# Patient Record
Sex: Male | Born: 1945 | Race: Black or African American | Hispanic: No | Marital: Single | Smoking: Former smoker
Health system: Southern US, Community
[De-identification: ages and names within clinical notes are randomized; demographics above are authoritative.]

## PROBLEM LIST (undated history)

## (undated) ENCOUNTER — Emergency Department (HOSPITAL_COMMUNITY): Discharge status: Against Medical Advice | Payer: Medicare Other

## (undated) ENCOUNTER — Emergency Department (HOSPITAL_COMMUNITY): Disposition: A | Discharge status: Home / Self Care | Payer: Self-pay

## (undated) DIAGNOSIS — R55 Syncope and collapse: Secondary | ICD-10-CM

## (undated) DIAGNOSIS — E860 Dehydration: Secondary | ICD-10-CM

## (undated) DIAGNOSIS — F0390 Unspecified dementia without behavioral disturbance: Secondary | ICD-10-CM

## (undated) DIAGNOSIS — I10 Essential (primary) hypertension: Secondary | ICD-10-CM

## (undated) DIAGNOSIS — C3490 Malignant neoplasm of unspecified part of unspecified bronchus or lung: Secondary | ICD-10-CM

## (undated) DIAGNOSIS — D649 Anemia, unspecified: Secondary | ICD-10-CM

## (undated) DIAGNOSIS — H5440 Blindness, one eye, unspecified eye: Secondary | ICD-10-CM

## (undated) HISTORY — DX: Dehydration: E86.0

## (undated) HISTORY — DX: Blindness, one eye, unspecified eye: H54.40

## (undated) HISTORY — DX: Syncope and collapse: R55

## (undated) HISTORY — DX: Essential (primary) hypertension: I10

---

## 2000-03-11 ENCOUNTER — Emergency Department (HOSPITAL_COMMUNITY): Admission: EM | Admit: 2000-03-11 | Discharge: 2000-03-12 | Payer: Self-pay

## 2006-02-09 ENCOUNTER — Ambulatory Visit: Payer: Self-pay | Admitting: Nurse Practitioner

## 2006-03-20 ENCOUNTER — Ambulatory Visit: Payer: Self-pay | Admitting: Nurse Practitioner

## 2006-03-29 ENCOUNTER — Encounter: Payer: Self-pay | Admitting: Cardiology

## 2006-03-29 ENCOUNTER — Ambulatory Visit (HOSPITAL_COMMUNITY): Admission: RE | Admit: 2006-03-29 | Discharge: 2006-03-29 | Payer: Self-pay | Admitting: Family Medicine

## 2006-03-29 ENCOUNTER — Ambulatory Visit: Payer: Self-pay | Admitting: Cardiology

## 2007-07-10 ENCOUNTER — Encounter: Admission: RE | Admit: 2007-07-10 | Discharge: 2007-07-10 | Payer: Self-pay | Admitting: Nephrology

## 2009-01-25 ENCOUNTER — Emergency Department (HOSPITAL_COMMUNITY): Admission: EM | Admit: 2009-01-25 | Discharge: 2009-01-26 | Payer: Self-pay | Admitting: Emergency Medicine

## 2010-01-21 ENCOUNTER — Emergency Department (HOSPITAL_COMMUNITY): Admission: EM | Admit: 2010-01-21 | Discharge: 2010-01-22 | Payer: Self-pay | Admitting: Emergency Medicine

## 2010-07-21 ENCOUNTER — Ambulatory Visit: Payer: Self-pay | Admitting: Cardiovascular Disease

## 2010-08-24 ENCOUNTER — Observation Stay (HOSPITAL_COMMUNITY)
Admission: EM | Admit: 2010-08-24 | Discharge: 2010-08-25 | Payer: Self-pay | Source: Home / Self Care | Attending: Cardiology | Admitting: Cardiology

## 2010-10-24 ENCOUNTER — Emergency Department (HOSPITAL_COMMUNITY)
Admission: EM | Admit: 2010-10-24 | Discharge: 2010-10-24 | Disposition: A | Discharge status: Home / Self Care | Payer: Medicaid Other | Attending: Emergency Medicine | Admitting: Emergency Medicine

## 2010-10-24 ENCOUNTER — Emergency Department (HOSPITAL_COMMUNITY): Payer: Medicaid Other

## 2010-10-24 DIAGNOSIS — S1093XA Contusion of unspecified part of neck, initial encounter: Secondary | ICD-10-CM | POA: Insufficient documentation

## 2010-10-24 DIAGNOSIS — S61209A Unspecified open wound of unspecified finger without damage to nail, initial encounter: Secondary | ICD-10-CM | POA: Insufficient documentation

## 2010-10-24 DIAGNOSIS — J45909 Unspecified asthma, uncomplicated: Secondary | ICD-10-CM | POA: Insufficient documentation

## 2010-10-24 DIAGNOSIS — IMO0002 Reserved for concepts with insufficient information to code with codable children: Secondary | ICD-10-CM | POA: Insufficient documentation

## 2010-10-24 DIAGNOSIS — S0003XA Contusion of scalp, initial encounter: Secondary | ICD-10-CM | POA: Insufficient documentation

## 2010-10-29 ENCOUNTER — Emergency Department (HOSPITAL_COMMUNITY)
Admission: EM | Admit: 2010-10-29 | Discharge: 2010-10-29 | Disposition: A | Discharge status: Home / Self Care | Payer: Medicaid Other | Attending: Emergency Medicine | Admitting: Emergency Medicine

## 2010-10-29 DIAGNOSIS — T8131XA Disruption of external operation (surgical) wound, not elsewhere classified, initial encounter: Secondary | ICD-10-CM | POA: Insufficient documentation

## 2010-10-29 DIAGNOSIS — Y849 Medical procedure, unspecified as the cause of abnormal reaction of the patient, or of later complication, without mention of misadventure at the time of the procedure: Secondary | ICD-10-CM | POA: Insufficient documentation

## 2010-10-29 DIAGNOSIS — X58XXXA Exposure to other specified factors, initial encounter: Secondary | ICD-10-CM | POA: Insufficient documentation

## 2010-10-29 DIAGNOSIS — S61209A Unspecified open wound of unspecified finger without damage to nail, initial encounter: Secondary | ICD-10-CM | POA: Insufficient documentation

## 2010-10-29 DIAGNOSIS — Y929 Unspecified place or not applicable: Secondary | ICD-10-CM | POA: Insufficient documentation

## 2010-11-15 LAB — COMPREHENSIVE METABOLIC PANEL
ALT: 17 U/L (ref 0–53)
AST: 15 U/L (ref 0–37)
Albumin: 2.8 g/dL — ABNORMAL LOW (ref 3.5–5.2)
Albumin: 2.9 g/dL — ABNORMAL LOW (ref 3.5–5.2)
Alkaline Phosphatase: 48 U/L (ref 39–117)
Alkaline Phosphatase: 52 U/L (ref 39–117)
BUN: 18 mg/dL (ref 6–23)
CO2: 29 mEq/L (ref 19–32)
Creatinine, Ser: 1.38 mg/dL (ref 0.4–1.5)
GFR calc non Af Amer: 50 mL/min — ABNORMAL LOW (ref >60)
Glucose, Bld: 97 mg/dL (ref 70–99)
Sodium: 134 mEq/L — ABNORMAL LOW (ref 135–145)
Sodium: 139 mEq/L (ref 135–145)
Total Protein: 7.6 g/dL (ref 6.0–8.3)

## 2010-11-15 LAB — LIPID PANEL
Cholesterol: 141 mg/dL (ref 0–200)
Total CHOL/HDL Ratio: 3.8 RATIO
Triglycerides: 47 mg/dL (ref <150)
VLDL: 9 mg/dL (ref 0–40)

## 2010-11-15 LAB — POCT CARDIAC MARKERS
CKMB, poc: 1 ng/mL (ref 1.0–8.0)
CKMB, poc: 1 ng/mL (ref 1.0–8.0)
CKMB, poc: 1.2 ng/mL (ref 1.0–8.0)
Myoglobin, poc: 116 ng/mL (ref 12–200)
Troponin i, poc: <0.05 ng/mL (ref 0.00–0.09)

## 2010-11-15 LAB — CBC
Hemoglobin: 12.7 g/dL — ABNORMAL LOW (ref 13.0–17.0)
MCH: 30.2 pg (ref 26.0–34.0)
MCV: 89.1 fL (ref 78.0–100.0)
Platelets: 102 10*3/uL — ABNORMAL LOW (ref 150–400)
Platelets: 103 10*3/uL — ABNORMAL LOW (ref 150–400)
RBC: 3.94 MIL/uL — ABNORMAL LOW (ref 4.22–5.81)
WBC: 2.3 10*3/uL — ABNORMAL LOW (ref 4.0–10.5)

## 2010-11-15 LAB — TROPONIN I: Troponin I: 0.02 ng/mL (ref 0.00–0.06)

## 2010-11-15 LAB — CK TOTAL AND CKMB (NOT AT ARMC)
CK, MB: 1.5 ng/mL (ref 0.3–4.0)
Relative Index: 1.4 (ref 0.0–2.5)

## 2010-11-15 LAB — PROTIME-INR: INR: 1.17 (ref 0.00–1.49)

## 2010-11-15 LAB — DIFFERENTIAL
Basophils Relative: 0 % (ref 0–1)
Eosinophils Absolute: 0 10*3/uL (ref 0.0–0.7)
Lymphs Abs: 0.9 10*3/uL (ref 0.7–4.0)

## 2010-11-15 LAB — HEMOGLOBIN A1C: Hgb A1c MFr Bld: 6.2 % — ABNORMAL HIGH (ref <5.7)

## 2010-11-15 LAB — BRAIN NATRIURETIC PEPTIDE: Pro B Natriuretic peptide (BNP): 31 pg/mL (ref 0.0–100.0)

## 2010-11-22 LAB — BASIC METABOLIC PANEL
BUN: 12 mg/dL (ref 6–23)
Calcium: 9.1 mg/dL (ref 8.4–10.5)
Creatinine, Ser: 1.48 mg/dL (ref 0.4–1.5)
GFR calc non Af Amer: 48 mL/min — ABNORMAL LOW (ref >60)
Glucose, Bld: 93 mg/dL (ref 70–99)

## 2010-11-22 LAB — CBC
MCV: 92.9 fL (ref 78.0–100.0)
Platelets: 97 10*3/uL — ABNORMAL LOW (ref 150–400)
RBC: 3.92 MIL/uL — ABNORMAL LOW (ref 4.22–5.81)
RDW: 12.8 % (ref 11.5–15.5)
WBC: 3.9 10*3/uL — ABNORMAL LOW (ref 4.0–10.5)

## 2010-11-22 LAB — DIFFERENTIAL
Basophils Absolute: 0 10*3/uL (ref 0.0–0.1)
Basophils Relative: 0 % (ref 0–1)
Eosinophils Relative: 0 % (ref 0–5)
Lymphocytes Relative: 27 % (ref 12–46)
Neutro Abs: 2.1 10*3/uL (ref 1.7–7.7)
Neutrophils Relative %: 55 % (ref 43–77)

## 2010-12-14 LAB — DIFFERENTIAL
Eosinophils Absolute: 0 10*3/uL (ref 0.0–0.7)
Monocytes Absolute: 0.2 10*3/uL (ref 0.1–1.0)
Monocytes Relative: 5 % (ref 3–12)

## 2010-12-14 LAB — CBC
MCHC: 34.7 g/dL (ref 30.0–36.0)
MCV: 92.3 fL (ref 78.0–100.0)
RBC: 4.01 MIL/uL — ABNORMAL LOW (ref 4.22–5.81)
RDW: 12.9 % (ref 11.5–15.5)
WBC: 4.2 10*3/uL (ref 4.0–10.5)

## 2010-12-14 LAB — URINALYSIS, ROUTINE W REFLEX MICROSCOPIC
Nitrite: NEGATIVE
Urobilinogen, UA: 1 mg/dL (ref 0.0–1.0)

## 2010-12-14 LAB — POCT I-STAT, CHEM 8
BUN: 13 mg/dL (ref 6–23)
Chloride: 105 mEq/L (ref 96–112)
Creatinine, Ser: 1.4 mg/dL (ref 0.4–1.5)
HCT: 39 % (ref 39.0–52.0)
Potassium: 3.8 mEq/L (ref 3.5–5.1)
TCO2: 28 mmol/L (ref 0–100)

## 2011-11-23 ENCOUNTER — Encounter: Payer: Self-pay | Admitting: *Deleted

## 2012-02-08 ENCOUNTER — Encounter: Payer: Self-pay | Admitting: Cardiovascular Disease

## 2012-03-06 ENCOUNTER — Telehealth: Payer: Self-pay | Admitting: Internal Medicine

## 2012-03-06 NOTE — Telephone Encounter (Signed)
l/m to call for new pt appt  aom °

## 2012-03-09 ENCOUNTER — Telehealth: Payer: Self-pay | Admitting: Internal Medicine

## 2012-03-09 NOTE — Telephone Encounter (Signed)
l/m with him for  pt to call,states that he will see the pt and that the pt does not have a direct #    aom

## 2012-03-14 ENCOUNTER — Telehealth: Payer: Self-pay | Admitting: Internal Medicine

## 2012-03-14 NOTE — Telephone Encounter (Signed)
lm/ at this number and also called [phone] and they (man)stated they had not seen him in a while   aom

## 2012-03-22 ENCOUNTER — Telehealth: Payer: Self-pay | Admitting: Internal Medicine

## 2012-03-22 NOTE — Telephone Encounter (Signed)
Referred by Dr. Earley Brooke Polyzos Dx- Leukopenia

## 2012-03-22 NOTE — Telephone Encounter (Signed)
New Pt. Package sent out.

## 2012-03-22 NOTE — Telephone Encounter (Signed)
New Pt package mailed out.  °

## 2012-03-22 NOTE — Telephone Encounter (Signed)
s/w daughter and made new pt appt for 7/23

## 2012-03-27 ENCOUNTER — Telehealth: Payer: Self-pay | Admitting: Medical Oncology

## 2012-03-27 ENCOUNTER — Other Ambulatory Visit (HOSPITAL_BASED_OUTPATIENT_CLINIC_OR_DEPARTMENT_OTHER): Payer: Medicaid Other

## 2012-03-27 ENCOUNTER — Ambulatory Visit: Payer: No Typology Code available for payment source

## 2012-03-27 ENCOUNTER — Ambulatory Visit (HOSPITAL_BASED_OUTPATIENT_CLINIC_OR_DEPARTMENT_OTHER): Payer: Medicaid Other | Admitting: Internal Medicine

## 2012-03-27 ENCOUNTER — Ambulatory Visit: Payer: Medicaid Other

## 2012-03-27 ENCOUNTER — Encounter: Payer: Self-pay | Admitting: Internal Medicine

## 2012-03-27 VITALS — BP 134/82 | HR 61 | Temp 97.5°F | Ht 69.0 in | Wt 185.7 lb

## 2012-03-27 DIAGNOSIS — D72819 Decreased white blood cell count, unspecified: Secondary | ICD-10-CM

## 2012-03-27 DIAGNOSIS — D61818 Other pancytopenia: Secondary | ICD-10-CM

## 2012-03-27 LAB — CBC WITH DIFFERENTIAL/PLATELET
BASO%: 0 % (ref 0.0–2.0)
EOS%: 5.1 % (ref 0.0–7.0)
HCT: 36.8 % — ABNORMAL LOW (ref 38.4–49.9)
LYMPH%: 37.8 % (ref 14.0–49.0)
MCH: 30.1 pg (ref 27.2–33.4)
MCHC: 34.8 g/dL (ref 32.0–36.0)
MCV: 86.6 fL (ref 79.3–98.0)
NEUT%: 46.5 % (ref 39.0–75.0)
Platelets: 115 10*3/uL — ABNORMAL LOW (ref 140–400)

## 2012-03-27 LAB — COMPREHENSIVE METABOLIC PANEL
AST: 15 U/L (ref 0–37)
Alkaline Phosphatase: 59 U/L (ref 39–117)
BUN: 17 mg/dL (ref 6–23)
Calcium: 9.5 mg/dL (ref 8.4–10.5)
Creatinine, Ser: 1.38 mg/dL — ABNORMAL HIGH (ref 0.50–1.35)

## 2012-03-27 NOTE — Patient Instructions (Signed)
Aplastic Anemia Aplastic anemia occurs when the soft material that makes up the hollow insides of your bones (bone marrow) stops making enough blood cells. These cells are:  Red cells to carry oxygen.   White cells to fight infection.   Platelets to help your blood clot when you have an injury.  Your bone marrow continually makes new blood cells because they do not last very long. Red blood cells live about 4 months. Platelets last only one week and white blood cells last about a day. Anything that hurts or injures your bone marrow can cause aplastic anemia. Aplastic anemia affects all age groups although it seems to appear a little more frequently in children. For unknown reasons, it also appears in people aged 54 to 62 and those over age 69. It usually develops slowly. In a few cases, symptoms can develop very quickly. There are many possible causes. Even after aggressive treatment, as many as 1 in 4 patients can die within the first year. Many are treated successfully but must be monitored for possible recurrent problems. Aplastic anemia is a rare and serious condition.  CAUSES  Some things that injure marrow can include:  Radiation and chemotherapy treatment for cancer. These treatments used to kill cancer cells also damage other cells.   Exposure to toxic chemicals used in some pesticides and insecticides may damage marrow.   Some medications, such as those used to treat rheumatoid arthritis and some antibiotics, can cause secondary aplastic anemia.   Autoimmune disorders in which your immune system begins attacking your own body cells.   Viral infections can affect bone marrow.   Pregnancy   Idiopathic aplastic anemia makes up about half the cases of aplastic anemia. This means the cause is unknown.  SYMPTOMS  Red blood cells carry oxygen. A decrease in red blood cells will make you short of breath. White blood cells fight infection. Decreases in white blood cells make you more  likely to get an infection. Platelets help your blood clot. Too few platelets can cause bleeding.  Other common signs and symptoms include:  Fatigue.   Lightheadedness or fainting.   Shortness of breath and rapid heart rate with exertion.   Pale skin and lips.   Frequent infections.   Easy bruising and bleeding.   Nosebleeds and bleeding gums.   Prolonged bleeding from cuts.   Sore mouth.  DIAGNOSIS   Blood tests and a bone marrow biopsy usually are used to find out what is wrong.   A number of different problems can make one of your blood cells low, but when they are all low, it is more worrisome.   Additional testing may be done to find the underlying cause for the anemia.  TREATMENT  You will usually be referred to a specialist in blood diseases (hematologist) or to a treatment center which specializes in the treatment of aplastic anemia. Severe aplastic anemia is life-threatening. You will need to be hospitalized. Mild or moderate aplastic anemia is still serious, but you may be treated as an outpatient unless complications develop. Treatments may include:  Observation for mild cases.   Blood transfusions.   Medications. When anemia is from an autoimmune disorder, medications may be used to suppress the immune system. Medications are also available to stimulate marrow to make more blood cells.   Bone marrow transplantation. This is a procedure where healthy marrow from a donor is given to the person with aplastic anemia. This is used for severe aplastic anemia. If a  donor is found, the marrow that you have left is depleted with radiation. This is done so the remaining marrow does not try to fight against the healthy donor marrow. The healthy marrow is given into a vein and it goes to the bone marrow cavities where it will hopefully begin producing new blood cells.  This procedure carries risk. If the body rejects the transplant, it can be life-threatening. Not everyone is a  candidate for transplantation. It requires a lengthy stay in the hospital. After the transplant, drugs to help prevent rejection are necessary. One other risk is the danger of catching a disease from the donor. Today the blood supply is the safest it has ever been. The risk is extremely small but still remains. HOME CARE INSTRUCTIONS   Get plenty of rest and eat a well balanced diet.   Avoid excessive exercise. Long-term anemia can stress the heart.   When platelets are at low levels and the risk of bleeding is greater, avoid all activities that risk injury.   Protect yourself from infections by washing your hands often. Avoid crowds. Avoid being around sick people.  PREVENTION  Especially if you have had this problem before, avoid exposure to insecticides, herbicides, organic solvents, paint removers and other toxic chemicals. SEEK IMMEDIATE MEDICAL CARE IF:  You develop a temperature above 100 F (37.8 C).   You develop flu-like feelings, signs of infection, or more frequent infections.   You have blood in your urine or bowel movements.   You develop easy bruising or bleeding from your gums or nose.   You have prolonged bleeding from cuts.   You have increasing shortness of breath or chest pain with exertion.   You develop a rapid heart rate with exertion.   You have increasing fatigue and tiredness.   You develop lightheadedness or fainting.   You develop pale skin and lips.   You develop a sore mouth.  MAKE SURE YOU:   Understand these instructions.   Will watch your condition.   Will get help right away if you are not doing well or get worse.  Document Released: 06/19/2007 Document Revised: 08/11/2011 Document Reviewed: 06/19/2007 [facility] Patient Information 2012 [place], [place].

## 2012-03-27 NOTE — Telephone Encounter (Signed)
BM asp/bx scheduled at [facility] 8/2 at 0800 and with flow. Pt instructions reviewed with pt and daughter-in-law -voiced understanding.

## 2012-03-27 NOTE — Progress Notes (Signed)
[facility] Telephone:([phone]   Fax:(336) [phone]  CONSULT NOTE  REASON FOR CONSULTATION:  66 years old [place] American male with pancytopenia.   HPI FARMER Ellis is a 66 y.o. male was past medical history significant for hypertension, asthma and previous leg surgery. The patient was seen recently at the [facility] urgent care [facility] for routine evaluation and physical exam. He has blood work done including CBC. It showed decreased white blood count of 2.3 with absolute neutrophil count of 1000. Hemoglobin was down to 12.8 and hematocrit 37.8% and platelets count 125,000. The patient was referred to me today for further evaluation and recommendation regarding his pancytopenia. He is feeling fine with no specific complaints except for mild fatigue. He denied having any significant bleeding issues, bruises or ecchymosis. Has no weight loss or night sweats. He does not take any over-the-counter medications or any nutritional supplements. Previous echo over the last 2 years showed that the patient had persistent mucositis being in as well as an anemia and thrombocytopenia. For example on 01/22/2010 his white blood count was 3.9, hemoglobin 12.4, hematocrit 36.4% with platelets count of 97,000. On 08/24/2010 his white blood count was 2.3, hemoglobin 12.7, hematocrit 36.9 and platelets count 102,000.  The patient has no family history of anemia or hematologic malignancy. His mother died from a stroke in father died from heart attack. The patient is single and has one son. He is currently unemployed. He has a history of smoking one pack per day for the last 50 years but no history of alcohol or drug abuse @SFHPI @  Past Medical History  Diagnosis Date  . Syncope   . Dehydration   . Blind left eye   . Hypertension     History reviewed. No pertinent past surgical history.  History reviewed. No pertinent family history.  Social History History  Substance Use Topics    . Smoking status: Current Everyday Smoker -- 1.0 packs/day for 38 years  . Smokeless tobacco: Not on file  . Alcohol Use: No    No Known Allergies  Current Outpatient Prescriptions  Medication Sig Dispense Refill  . amLODipine (NORVASC) 10 MG tablet Take 10 mg by mouth Daily.        Review of Systems  A comprehensive review of systems was negative except for: Constitutional: positive for fatigue  Physical Exam  AVW:UJWJX, healthy, no distress, well nourished and well developed SKIN: skin color, texture, turgor are normal HEAD: Normocephalic, No masses, lesions, tenderness or abnormalities EYES: normal EARS: External ears normal OROPHARYNX:no exudate and no erythema  NECK: supple, no adenopathy LYMPH:  no palpable lymphadenopathy, no hepatosplenomegaly LUNGS: clear to auscultation  HEART: regular rate & rhythm, no murmurs and no gallops ABDOMEN:abdomen soft, non-tender, normal bowel sounds and no masses or organomegaly BACK: Back symmetric, no curvature. EXTREMITIES:no joint deformities, effusion, or inflammation, no edema  NEURO: alert & oriented x 3 with fluent speech, no focal motor/sensory deficits  PERFORMANCE STATUS: ECOG 1  LABORATORY DATA: Lab Results  Component Value Date   WBC 2.5* 03/27/2012   HGB 12.8* 03/27/2012   HCT 36.8* 03/27/2012   MCV 86.6 03/27/2012   PLT 115* 03/27/2012      Chemistry      Component Value Date/Time   NA 139 08/25/2010 0525   K 3.5 08/25/2010 0525   CL 106 08/25/2010 0525   CO2 29 08/25/2010 0525   BUN 18 08/25/2010 0525   CREATININE 1.44 08/25/2010 0525  Component Value Date/Time   CALCIUM 9.1 08/25/2010 0525   ALKPHOS 48 08/25/2010 0525   AST 15 08/25/2010 0525   ALT 17 08/25/2010 0525   BILITOT 0.5 08/25/2010 0525       RADIOGRAPHIC STUDIES: No results found.  ASSESSMENT: This is a very pleasant 66 years old [place] American male with pancytopenia of unclear etiology.  PLAN: I have a lengthy discussion with  the patient and his daughter-in-law today about his current condition and further investigation to identify the etiology for his condition. I ordered several studies today including repeat CBC, comprehensive metabolic panel, LDH, iron study, ferritin, serum folate, serum vitamin B12, serum protein electrophoreses. I would also consider the patient for a bone marrow biopsy and aspirate in the next one to 2 weeks.  I would see the patient back for followup visit in 3 weeks for evaluation and discussion of his lab and biopsy results. He was given patient instructions about pancytopenia. He was advised to call me immediately she has any concerning symptoms in the interval.  All questions were answered. The patient knows to call the clinic with any problems, questions or concerns. We can certainly see the patient much sooner if necessary.  Thank you so much for allowing me to participate in the care of Stephen Ellis. I will continue to follow up the patient with you and assist in his care.  I spent 30 minutes counseling the patient face to face. The total time spent in the appointment was 55 minutes.  Shaylynn Nulty K. 03/27/2012, 2:54 PM

## 2012-03-27 NOTE — Telephone Encounter (Signed)
Placed patient on the walk in list for the lab on 03-27-2012 gave patient appointment for 04-19-2012 starting at 10:30am printed out calendar and gave to the patient

## 2012-04-02 LAB — IRON AND TIBC
%SAT: 39 % (ref 20–55)
Iron: 98 ug/dL (ref 42–165)
UIBC: 152 ug/dL (ref 125–400)

## 2012-04-02 LAB — PROTEIN ELECTROPHORESIS, SERUM, WITH REFLEX
Albumin ELP: 43.8 % — ABNORMAL LOW (ref 55.8–66.1)
Alpha-2-Globulin: 8.5 % (ref 7.1–11.8)
Beta 2: 34.2 % — ABNORMAL HIGH (ref 3.2–6.5)
Beta Globulin: 4.1 % — ABNORMAL LOW (ref 4.7–7.2)

## 2012-04-02 LAB — ERYTHROPOIETIN: Erythropoietin: 14.1 m[IU]/mL (ref 2.6–34.0)

## 2012-04-02 LAB — HEPATITIS PANEL, ACUTE
HCV Ab: NEGATIVE
Hep A IgM: NEGATIVE

## 2012-04-02 LAB — FERRITIN: Ferritin: 399 ng/mL — ABNORMAL HIGH (ref 22–322)

## 2012-04-02 LAB — RHEUMATOID FACTOR: Rhuematoid fact SerPl-aCnc: <10 IU/mL (ref <=14)

## 2012-04-02 LAB — IGG, IGA, IGM: IgM, Serum: 38 mg/dL — ABNORMAL LOW (ref 41–251)

## 2012-04-02 LAB — HIV ANTIBODY (ROUTINE TESTING W REFLEX): HIV: NONREACTIVE

## 2012-04-02 LAB — ANA: Anti Nuclear Antibody(ANA): NEGATIVE

## 2012-04-04 ENCOUNTER — Other Ambulatory Visit (HOSPITAL_COMMUNITY): Payer: Self-pay | Admitting: *Deleted

## 2012-04-06 ENCOUNTER — Ambulatory Visit (HOSPITAL_COMMUNITY)
Admission: RE | Admit: 2012-04-06 | Discharge: 2012-04-06 | Disposition: A | Discharge status: Home / Self Care | Payer: Medicaid Other | Source: Ambulatory Visit | Attending: Internal Medicine | Admitting: Internal Medicine

## 2012-04-06 ENCOUNTER — Encounter (HOSPITAL_COMMUNITY): Payer: Self-pay

## 2012-04-06 VITALS — BP 134/84 | HR 53 | Temp 97.5°F | Resp 19 | Ht 69.0 in | Wt 185.0 lb

## 2012-04-06 DIAGNOSIS — I10 Essential (primary) hypertension: Secondary | ICD-10-CM | POA: Insufficient documentation

## 2012-04-06 DIAGNOSIS — D61818 Other pancytopenia: Secondary | ICD-10-CM | POA: Insufficient documentation

## 2012-04-06 DIAGNOSIS — D72819 Decreased white blood cell count, unspecified: Secondary | ICD-10-CM

## 2012-04-06 DIAGNOSIS — D72822 Plasmacytosis: Secondary | ICD-10-CM | POA: Insufficient documentation

## 2012-04-06 DIAGNOSIS — F17200 Nicotine dependence, unspecified, uncomplicated: Secondary | ICD-10-CM | POA: Insufficient documentation

## 2012-04-06 HISTORY — DX: Unspecified dementia, unspecified severity, without behavioral disturbance, psychotic disturbance, mood disturbance, and anxiety: F03.90

## 2012-04-06 HISTORY — DX: Anemia, unspecified: D64.9

## 2012-04-06 LAB — CBC WITH DIFFERENTIAL/PLATELET
Basophils Relative: 0 % (ref 0–1)
Eosinophils Absolute: 0.1 10*3/uL (ref 0.0–0.7)
Eosinophils Relative: 3 % (ref 0–5)
HCT: 34.7 % — ABNORMAL LOW (ref 39.0–52.0)
Hemoglobin: 12.1 g/dL — ABNORMAL LOW (ref 13.0–17.0)
Lymphs Abs: 1 10*3/uL (ref 0.7–4.0)
MCH: 30.5 pg (ref 26.0–34.0)
MCHC: 34.9 g/dL (ref 30.0–36.0)
MCV: 87.4 fL (ref 78.0–100.0)
Monocytes Absolute: 0.4 10*3/uL (ref 0.1–1.0)
Neutro Abs: 1.4 10*3/uL — ABNORMAL LOW (ref 1.7–7.7)
RBC: 3.97 MIL/uL — ABNORMAL LOW (ref 4.22–5.81)

## 2012-04-06 MED ORDER — SODIUM CHLORIDE 0.9 % IV SOLN
Freq: Once | INTRAVENOUS | Status: AC
  Administered 2012-04-06: 07:00:00 via INTRAVENOUS

## 2012-04-06 MED ORDER — MIDAZOLAM HCL 10 MG/2ML IJ SOLN
INTRAMUSCULAR | Status: AC
  Administered 2012-04-06: 10 mg
  Filled 2012-04-06: qty 2

## 2012-04-06 NOTE — ED Notes (Signed)
Family updated as to patient's status.

## 2012-04-06 NOTE — ED Notes (Signed)
Family updated as to patient's status. Teaching done c family and patient

## 2012-04-06 NOTE — Procedures (Signed)
Bone Marrow Biopsy and Aspiration Procedure Note  Mallampati's class: 1  ASA class: 1  Informed consent was obtained and potential risks including bleeding, infection and pain were reviewed with the patient.   Versed 4 mg given IV for sedation  Posterior iliac crest(s) prepped with Betadine.   Lidocaine 2% local anesthesia infiltrated into the subcutaneous tissue.  Left bone marrow biopsy and left bone marrow aspirate was obtained.   The procedure was tolerated well and there were no complications.  Specimens sent for: routine histopathologic stains and sectioning, flow cytometry, cytogenetics and Myeloma Panel.  Physician: Lajuana Matte.

## 2012-04-06 NOTE — ED Notes (Signed)
Patient denies pain and is resting comfortably.  

## 2012-04-06 NOTE — ED Notes (Signed)
Pt very anxious  Requiring holding of arms and legs by staff

## 2012-04-06 NOTE — ED Notes (Signed)
Vital signs stable. 

## 2012-04-06 NOTE — Sedation Documentation (Signed)
Medication dose calculated and verified for: Stephen Ellis [id number]

## 2012-04-06 NOTE — ED Notes (Signed)
Patient is resting comfortably. 

## 2012-04-06 NOTE — ED Notes (Signed)
dsg cdi 

## 2012-04-06 NOTE — ED Notes (Signed)
dsg applied per md to left  hip area

## 2012-04-06 NOTE — Progress Notes (Signed)
Patient unable to answer some questions correctly. Asked family if patient had dementia and daughter in law stated" he is just illiterate".

## 2012-04-18 ENCOUNTER — Ambulatory Visit: Payer: No Typology Code available for payment source | Admitting: Internal Medicine

## 2012-04-18 ENCOUNTER — Other Ambulatory Visit: Payer: No Typology Code available for payment source | Admitting: Lab

## 2012-04-19 ENCOUNTER — Other Ambulatory Visit: Payer: Self-pay | Admitting: *Deleted

## 2012-04-19 ENCOUNTER — Ambulatory Visit (HOSPITAL_BASED_OUTPATIENT_CLINIC_OR_DEPARTMENT_OTHER): Payer: Medicaid Other | Admitting: Internal Medicine

## 2012-04-19 ENCOUNTER — Telehealth: Payer: Self-pay | Admitting: Internal Medicine

## 2012-04-19 ENCOUNTER — Other Ambulatory Visit (HOSPITAL_BASED_OUTPATIENT_CLINIC_OR_DEPARTMENT_OTHER): Payer: Medicaid Other | Admitting: Lab

## 2012-04-19 VITALS — BP 142/85 | HR 51 | Temp 97.1°F | Resp 20 | Ht 69.0 in | Wt 189.8 lb

## 2012-04-19 DIAGNOSIS — D72819 Decreased white blood cell count, unspecified: Secondary | ICD-10-CM

## 2012-04-19 DIAGNOSIS — E8809 Other disorders of plasma-protein metabolism, not elsewhere classified: Secondary | ICD-10-CM

## 2012-04-19 LAB — CBC WITH DIFFERENTIAL/PLATELET
BASO%: 0.1 % (ref 0.0–2.0)
HCT: 36.1 % — ABNORMAL LOW (ref 38.4–49.9)
LYMPH%: 34.9 % (ref 14.0–49.0)
MCHC: 34 g/dL (ref 32.0–36.0)
MCV: 90.9 fL (ref 79.3–98.0)
MONO%: 18.1 % — ABNORMAL HIGH (ref 0.0–14.0)
NEUT%: 45.7 % (ref 39.0–75.0)
Platelets: 120 10*3/uL — ABNORMAL LOW (ref 140–400)
RBC: 3.97 10*6/uL — ABNORMAL LOW (ref 4.20–5.82)

## 2012-04-19 NOTE — Progress Notes (Signed)
[facility] Telephone:(336) [phone]   Fax:(336) [phone]  OFFICE PROGRESS NOTE  No primary provider on file. No primary provider on file.  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 male returns to the clinic today for followup visit accompanied by several family members. The patient is doing fine today with no specific complaints. He denied having any significant weight loss or night sweats. He denied having any fatigue or weakness. He has no chest pain, shortness breath, cough or hemoptysis. He had bone marrow biopsy and aspirate performed recently and he is here for evaluation and discussion of his biopsy and lab results.  MEDICAL HISTORY: Past Medical History  Diagnosis Date  . Syncope   . Dehydration   . Blind left eye   . Hypertension   . Anemia     "low Blood"  . Dementia     poor memory    ALLERGIES:   has no known allergies.  MEDICATIONS:  Current Outpatient Prescriptions  Medication Sig Dispense Refill  . amLODipine (NORVASC) 10 MG tablet Take 10 mg by mouth Daily.        SURGICAL HISTORY:  Past Surgical History  Procedure Date  . No past surgeries     REVIEW OF SYSTEMS:  A comprehensive review of systems was negative.   PHYSICAL EXAMINATION: General appearance: alert, cooperative and no distress Head: Normocephalic, without obvious abnormality, atraumatic Neck: no adenopathy Lymph nodes: Cervical, supraclavicular, and axillary nodes normal. Resp: clear to auscultation bilaterally Cardio: regular rate and rhythm, S1, S2 normal, no murmur, click, rub or gallop GI: soft, non-tender; bowel sounds normal; no masses,  no organomegaly Extremities: extremities normal, atraumatic, no cyanosis or edema Neurologic: Alert and oriented X 3, normal strength and tone. Normal symmetric reflexes. Normal coordination and gait  ECOG  PERFORMANCE STATUS: 1 - Symptomatic but completely ambulatory  Blood pressure 142/85, pulse 51, temperature 97.1 F (36.2 C), temperature source Oral, resp. rate 20, height 5\' 9"  (1.753 m), weight 189 lb 12.8 oz (86.093 kg).  LABORATORY DATA: Lab Results  Component Value Date   WBC 3.2* 04/19/2012   HGB 12.3* 04/19/2012   HCT 36.1* 04/19/2012   MCV 90.9 04/19/2012   PLT 120* 04/19/2012      Chemistry      Component Value Date/Time   NA 138 03/27/2012 1337   K 3.8 03/27/2012 1337   CL 105 03/27/2012 1337   CO2 25 03/27/2012 1337   BUN 17 03/27/2012 1337   CREATININE 1.38* 03/27/2012 1337      Component Value Date/Time   CALCIUM 9.5 03/27/2012 1337   ALKPHOS 59 03/27/2012 1337   AST 15 03/27/2012 1337   ALT 17 03/27/2012 1337   BILITOT 0.4 03/27/2012 1337       RADIOGRAPHIC STUDIES: No results found.  ASSESSMENT: This is a very pleasant 66 years old [place] American male recently diagnosed with plasma cell dyscrasia presenting 11% of the bone marrow with a slightly elevated IgG.  PLAN: I discussed the lab and biopsy result with the patient and his family. I will order skeletal bone survey to be performed before his next visit. I would see the patient back for followup visit in 3 months with repeat CBC, comprehensive metabolic panel, LDH and myeloma panel. He was advised to call me immediately if he has any concerning symptoms in the interval.  All questions were  answered. The patient knows to call the clinic with any problems, questions or concerns. We can certainly see the patient much sooner if necessary.  I spent 15 minutes counseling the patient face to face. The total time spent in the appointment was 25 minutes.

## 2012-04-19 NOTE — Telephone Encounter (Signed)
gv relative appt schedule for November including bone survey for 11/11.

## 2012-07-13 ENCOUNTER — Telehealth: Payer: Self-pay | Admitting: Internal Medicine

## 2012-07-13 NOTE — Telephone Encounter (Signed)
called to r/s appts,done    aom

## 2012-07-16 ENCOUNTER — Other Ambulatory Visit: Payer: Medicaid Other

## 2012-07-16 ENCOUNTER — Other Ambulatory Visit (HOSPITAL_COMMUNITY): Payer: Medicaid Other

## 2012-07-18 ENCOUNTER — Ambulatory Visit: Payer: Medicaid Other | Admitting: Internal Medicine

## 2012-07-20 ENCOUNTER — Other Ambulatory Visit (HOSPITAL_BASED_OUTPATIENT_CLINIC_OR_DEPARTMENT_OTHER): Payer: Medicaid Other

## 2012-07-20 ENCOUNTER — Other Ambulatory Visit: Payer: Medicaid Other

## 2012-07-20 ENCOUNTER — Ambulatory Visit (HOSPITAL_COMMUNITY)
Admission: RE | Admit: 2012-07-20 | Discharge: 2012-07-20 | Disposition: A | Discharge status: Home / Self Care | Payer: Medicaid Other | Source: Ambulatory Visit | Attending: Internal Medicine | Admitting: Internal Medicine

## 2012-07-20 DIAGNOSIS — M1710 Unilateral primary osteoarthritis, unspecified knee: Secondary | ICD-10-CM | POA: Insufficient documentation

## 2012-07-20 DIAGNOSIS — C9000 Multiple myeloma not having achieved remission: Secondary | ICD-10-CM | POA: Insufficient documentation

## 2012-07-20 DIAGNOSIS — E8809 Other disorders of plasma-protein metabolism, not elsewhere classified: Secondary | ICD-10-CM

## 2012-07-20 DIAGNOSIS — M948X9 Other specified disorders of cartilage, unspecified sites: Secondary | ICD-10-CM | POA: Insufficient documentation

## 2012-07-20 DIAGNOSIS — M47817 Spondylosis without myelopathy or radiculopathy, lumbosacral region: Secondary | ICD-10-CM | POA: Insufficient documentation

## 2012-07-20 LAB — CBC WITH DIFFERENTIAL/PLATELET
BASO%: 0.6 % (ref 0.0–2.0)
EOS%: 0.4 % (ref 0.0–7.0)
HCT: 37.4 % — ABNORMAL LOW (ref 38.4–49.9)
MCH: 32.4 pg (ref 27.2–33.4)
MCHC: 35.2 g/dL (ref 32.0–36.0)
MONO#: 0.4 10*3/uL (ref 0.1–0.9)
NEUT%: 51.3 % (ref 39.0–75.0)
RBC: 4.07 10*6/uL — ABNORMAL LOW (ref 4.20–5.82)
RDW: 13.6 % (ref 11.0–14.6)
WBC: 2.3 10*3/uL — ABNORMAL LOW (ref 4.0–10.3)
lymph#: 0.7 10*3/uL — ABNORMAL LOW (ref 0.9–3.3)

## 2012-07-20 LAB — COMPREHENSIVE METABOLIC PANEL (CC13)
ALT: 16 U/L (ref 0–55)
Albumin: 3.2 g/dL — ABNORMAL LOW (ref 3.5–5.0)
Alkaline Phosphatase: 60 U/L (ref 40–150)
CO2: 28 mEq/L (ref 22–29)
Glucose: 99 mg/dl (ref 70–99)
Potassium: 4.2 mEq/L (ref 3.5–5.1)
Sodium: 137 mEq/L (ref 136–145)
Total Bilirubin: 0.44 mg/dL (ref 0.20–1.20)
Total Protein: 8.5 g/dL — ABNORMAL HIGH (ref 6.4–8.3)

## 2012-07-23 LAB — KAPPA/LAMBDA LIGHT CHAINS
Kappa free light chain: 4.81 mg/dL — ABNORMAL HIGH (ref 0.33–1.94)
Kappa:Lambda Ratio: 1.94 — ABNORMAL HIGH (ref 0.26–1.65)
Lambda Free Lght Chn: 2.48 mg/dL (ref 0.57–2.63)

## 2012-07-23 LAB — BETA 2 MICROGLOBULIN, SERUM: Beta-2 Microglobulin: 2.74 mg/L — ABNORMAL HIGH (ref 1.01–1.73)

## 2012-07-24 ENCOUNTER — Ambulatory Visit: Payer: Medicaid Other | Admitting: Internal Medicine

## 2012-07-26 ENCOUNTER — Other Ambulatory Visit: Payer: Self-pay | Admitting: *Deleted

## 2012-07-31 ENCOUNTER — Telehealth: Payer: Self-pay | Admitting: *Deleted

## 2012-07-31 NOTE — Telephone Encounter (Signed)
Mailed out calendar to inform the patient of the new date and time on 08-20-2012

## 2012-08-20 ENCOUNTER — Telehealth: Payer: Self-pay | Admitting: Internal Medicine

## 2012-08-20 ENCOUNTER — Ambulatory Visit (HOSPITAL_BASED_OUTPATIENT_CLINIC_OR_DEPARTMENT_OTHER): Payer: Medicaid Other | Admitting: Internal Medicine

## 2012-08-20 VITALS — BP 128/77 | HR 55 | Temp 98.0°F | Resp 20 | Ht 69.0 in | Wt 185.9 lb

## 2012-08-20 DIAGNOSIS — E8809 Other disorders of plasma-protein metabolism, not elsewhere classified: Secondary | ICD-10-CM

## 2012-08-20 NOTE — Patient Instructions (Signed)
Your lab showed increase in the myeloma panel but you are currently asymptomatic. He will continue on observation for now with repeat myeloma panel in 3 months. Please call if you have any concerning symptoms in the interval

## 2012-08-20 NOTE — Telephone Encounter (Signed)
appts made and printed for pt aom °

## 2012-08-20 NOTE — Progress Notes (Signed)
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 male returns to the clinic today for followup visit accompanied by his son. The patient is feeling fine today with no specific complaints. He denied having any aching pain. He denied having any weight loss or night sweats. He denied having any significant chest pain, shortness breath, cough or hemoptysis. He had repeat myeloma panel performed recently and he is here today for evaluation and discussion of his lab results.  MEDICAL HISTORY: Past Medical History  Diagnosis Date  . Syncope   . Dehydration   . Blind left eye   . Hypertension   . Anemia     "low Blood"  . Dementia     poor memory    ALLERGIES:   has no known allergies.  MEDICATIONS:  Current Outpatient Prescriptions  Medication Sig Dispense Refill  . amLODipine (NORVASC) 10 MG tablet Take 10 mg by mouth Daily.        SURGICAL HISTORY:  Past Surgical History  Procedure Date  . No past surgeries     REVIEW OF SYSTEMS:  A comprehensive review of systems was negative.   PHYSICAL EXAMINATION: General appearance: alert, cooperative and no distress Head: Normocephalic, without obvious abnormality, atraumatic Neck: no adenopathy Resp: clear to auscultation bilaterally Cardio: regular rate and rhythm, S1, S2 normal, no murmur, click, rub or gallop GI: soft, non-tender; bowel sounds normal; no masses,  no organomegaly Extremities: extremities normal, atraumatic, no cyanosis or edema  ECOG PERFORMANCE STATUS: 0 - Asymptomatic  There were no vitals taken for this visit.  LABORATORY DATA: Lab Results  Component Value Date   WBC 2.3* 07/20/2012   HGB 13.2 07/20/2012   HCT 37.4* 07/20/2012   MCV 92.1 07/20/2012   PLT 111* 07/20/2012      Chemistry        Component Value Date/Time   NA 137 07/20/2012 1327   NA 138 03/27/2012 1337   K 4.2 07/20/2012 1327   K 3.8 03/27/2012 1337   CL 105 07/20/2012 1327   CL 105 03/27/2012 1337   CO2 28 07/20/2012 1327   CO2 25 03/27/2012 1337   BUN 16.0 07/20/2012 1327   BUN 17 03/27/2012 1337   CREATININE 1.5* 07/20/2012 1327   CREATININE 1.38* 03/27/2012 1337      Component Value Date/Time   CALCIUM 9.6 07/20/2012 1327   CALCIUM 9.5 03/27/2012 1337   ALKPHOS 60 07/20/2012 1327   ALKPHOS 59 03/27/2012 1337   AST 13 07/20/2012 1327   AST 15 03/27/2012 1337   ALT 16 07/20/2012 1327   ALT 17 03/27/2012 1337   BILITOT 0.44 07/20/2012 1327   BILITOT 0.4 03/27/2012 1337       RADIOGRAPHIC STUDIES: No results found.  ASSESSMENT: This is a very pleasant 66 years old [place] American male with plasma cell dyscrasia currently on observation. The patient has some evidence for disease progression especially with increase in the IgG level but he is currently asymptomatic.  PLAN: I discussed the lab result with the patient and since he is currently asymptomatic I would recommend for him continuous observation for now with repeat myeloma panel in 3 months. The patient would come back for followup visit at that time. He was advised to call me immediately if he has any  concerning symptoms in the interval.  All questions were answered. The patient knows to call the clinic with any problems, questions or concerns. We can certainly see the patient much sooner if necessary.

## 2012-11-15 ENCOUNTER — Other Ambulatory Visit: Payer: Medicaid Other

## 2012-11-19 ENCOUNTER — Ambulatory Visit: Payer: Medicaid Other | Admitting: Internal Medicine

## 2013-06-14 ENCOUNTER — Emergency Department (INDEPENDENT_AMBULATORY_CARE_PROVIDER_SITE_OTHER)
Admission: EM | Admit: 2013-06-14 | Discharge: 2013-06-14 | Disposition: A | Discharge status: Home / Self Care | Payer: Medicare Other | Source: Home / Self Care | Attending: Emergency Medicine | Admitting: Emergency Medicine

## 2013-06-14 ENCOUNTER — Encounter (HOSPITAL_COMMUNITY): Payer: Self-pay | Admitting: Emergency Medicine

## 2013-06-14 ENCOUNTER — Ambulatory Visit (HOSPITAL_COMMUNITY): Payer: Medicare Other | Attending: Emergency Medicine

## 2013-06-14 DIAGNOSIS — M79609 Pain in unspecified limb: Secondary | ICD-10-CM

## 2013-06-14 DIAGNOSIS — IMO0002 Reserved for concepts with insufficient information to code with codable children: Secondary | ICD-10-CM | POA: Insufficient documentation

## 2013-06-14 DIAGNOSIS — E8809 Other disorders of plasma-protein metabolism, not elsewhere classified: Secondary | ICD-10-CM

## 2013-06-14 DIAGNOSIS — M1710 Unilateral primary osteoarthritis, unspecified knee: Secondary | ICD-10-CM | POA: Insufficient documentation

## 2013-06-14 DIAGNOSIS — M79605 Pain in left leg: Secondary | ICD-10-CM

## 2013-06-14 DIAGNOSIS — M25569 Pain in unspecified knee: Secondary | ICD-10-CM | POA: Insufficient documentation

## 2013-06-14 LAB — POCT I-STAT, CHEM 8
Calcium, Ion: 1.28 mmol/L (ref 1.13–1.30)
Creatinine, Ser: 1.5 mg/dL — ABNORMAL HIGH (ref 0.50–1.35)
Glucose, Bld: 62 mg/dL — ABNORMAL LOW (ref 70–99)
HCT: 44 % (ref 39.0–52.0)
Hemoglobin: 15 g/dL (ref 13.0–17.0)
TCO2: 29 mmol/L (ref 0–100)

## 2013-06-14 LAB — CBC WITH DIFFERENTIAL/PLATELET
Basophils Relative: 0 % (ref 0–1)
Eosinophils Absolute: 0 10*3/uL (ref 0.0–0.7)
Eosinophils Relative: 1 % (ref 0–5)
HCT: 39.4 % (ref 39.0–52.0)
Hemoglobin: 13.6 g/dL (ref 13.0–17.0)
MCH: 30.9 pg (ref 26.0–34.0)
MCHC: 34.5 g/dL (ref 30.0–36.0)
MCV: 89.5 fL (ref 78.0–100.0)
Monocytes Absolute: 0.4 10*3/uL (ref 0.1–1.0)
Monocytes Relative: 15 % — ABNORMAL HIGH (ref 3–12)

## 2013-06-14 MED ORDER — ENOXAPARIN SODIUM 100 MG/ML ~~LOC~~ SOLN
1.0000 mg/kg | Freq: Once | SUBCUTANEOUS | Status: AC
  Administered 2013-06-14: 85 mg via SUBCUTANEOUS

## 2013-06-14 MED ORDER — ACETAMINOPHEN 325 MG PO TABS
650.0000 mg | ORAL_TABLET | Freq: Once | ORAL | Status: AC
  Administered 2013-06-14: 650 mg via ORAL

## 2013-06-14 MED ORDER — ACETAMINOPHEN 325 MG PO TABS
ORAL_TABLET | ORAL | Status: AC
  Filled 2013-06-14: qty 2

## 2013-06-14 MED ORDER — TRAMADOL HCL 50 MG PO TABS: 100.0000 mg | ORAL_TABLET | Freq: Three times a day (TID) | ORAL | Status: DC | PRN

## 2013-06-14 NOTE — ED Notes (Addendum)
Follow up visit; pain in leg as result of trauma. Patient thinks it was snowing the day he had his visit
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
## 2013-06-15 ENCOUNTER — Ambulatory Visit (HOSPITAL_COMMUNITY)
Admission: RE | Admit: 2013-06-15 | Discharge: 2013-06-15 | Disposition: A | Discharge status: Home / Self Care | Payer: Medicare Other | Source: Ambulatory Visit | Attending: Emergency Medicine | Admitting: Emergency Medicine

## 2013-06-15 ENCOUNTER — Encounter (HOSPITAL_COMMUNITY): Payer: Self-pay | Admitting: Emergency Medicine

## 2013-06-15 ENCOUNTER — Emergency Department (INDEPENDENT_AMBULATORY_CARE_PROVIDER_SITE_OTHER)
Admission: EM | Admit: 2013-06-15 | Discharge: 2013-06-15 | Disposition: A | Discharge status: Home / Self Care | Payer: Medicare Other | Source: Home / Self Care | Attending: Family Medicine | Admitting: Family Medicine

## 2013-06-15 DIAGNOSIS — I8000 Phlebitis and thrombophlebitis of superficial vessels of unspecified lower extremity: Secondary | ICD-10-CM

## 2013-06-15 DIAGNOSIS — M79609 Pain in unspecified limb: Secondary | ICD-10-CM

## 2013-06-15 DIAGNOSIS — I8002 Phlebitis and thrombophlebitis of superficial vessels of left lower extremity: Secondary | ICD-10-CM

## 2013-06-15 DIAGNOSIS — R791 Abnormal coagulation profile: Secondary | ICD-10-CM | POA: Insufficient documentation

## 2013-06-15 NOTE — Progress Notes (Signed)
*  Preliminary Results* Left lower extremity venous duplex completed. 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 Thrombosis originates approximately 1 inch distal to the popliteal vein. There is no evidence of left Baker's cyst.  Preliminary results discussed with Dr.Plunkett and she has suggested the patient follow up at either Urgent Care or the Emergency Department for treatment.   06/15/2013 10:07 AM  Gertie Fey, RVT, RDCS, RDMS

## 2013-06-15 NOTE — ED Notes (Signed)
Pt          Here  For  A  Recheck  Of      l  Leg           Pt reports  Pain is  In  The  Calf          Had   Superficial  Phlebitis   But  No  dvt according to  Dr  Artis Flock        From  Ultrasound  he  Had  This  Am

## 2013-06-15 NOTE — ED Provider Notes (Signed)
CSN: [id number]     Arrival date & time 06/15/13  1057 History   First MD Initiated Contact with Patient 06/15/13 1230     Chief Complaint  Patient presents with  . Wound Check   (Consider location/radiation/quality/duration/timing/severity/associated sxs/prior Treatment) Patient is a 67 y.o. male presenting with wound check. The history is provided by the patient and a caregiver.  Wound Check This is a new problem. The current episode started yesterday. The problem has not changed since onset.   Past Medical History  Diagnosis Date  . Syncope   . Dehydration   . Blind left eye   . Hypertension   . Anemia     "low Blood"  . Dementia     poor memory   Past Surgical History  Procedure Laterality Date  . No past surgeries     History reviewed. No pertinent family history. History  Substance Use Topics  . Smoking status: Current Every Day Smoker -- 1.00 packs/day for 38 years  . Smokeless tobacco: Not on file  . Alcohol Use: No    Review of Systems  Constitutional: Negative.   Musculoskeletal: Negative for back pain, gait problem and joint swelling.  Skin: Negative for rash.    Allergies  Review of patient's allergies indicates no known allergies.  Home Medications   Current Outpatient Rx  Name  Route  Sig  Dispense  Refill  . amLODipine (NORVASC) 10 MG tablet   Oral   Take 10 mg by mouth Daily.         . traMADol (ULTRAM) 50 MG tablet   Oral   Take 2 tablets (100 mg total) by mouth every 8 (eight) hours as needed for pain.   30 tablet   0    BP 152/100  Pulse 57  Temp(Src) 97.8 F (36.6 C) (Oral)  Resp 16  SpO2 100% Physical Exam  Nursing note and vitals reviewed. Constitutional: He appears well-developed and well-nourished.  Musculoskeletal: He exhibits no edema and no tenderness.  Neurological: He is alert.  Skin: Skin is warm and dry.    ED Course  Procedures (including critical care time) Labs Review Labs Reviewed - No data to  display Imaging Review Dg Knee Complete 4 Views Left  06/14/2013   CLINICAL DATA:  Left knee pain.  EXAM: LEFT KNEE - COMPLETE 4+ VIEW  COMPARISON:  Left knee radiograph 07/10/2007.  FINDINGS: Multiple views of the left knee demonstrate no acute displaced fracture, subluxation, dislocation, or soft tissue abnormality. Mild joint space narrowing, subchondral sclerosis and osteophyte formation is noted a tricompartmental distribution, compatible with mild osteoarthritis.  IMPRESSION: 1. No acute radiographic abnormality of the left knee. 2. Mild osteoarthritis of the left knee.   Electronically Signed   By: Trudie Reed M.D.   On: 06/14/2013 18:24      MDM  Doppler neg.    Linna Hoff, MD 06/15/13 1250

## 2013-06-19 ENCOUNTER — Telehealth: Payer: Self-pay | Admitting: Medical Oncology

## 2013-06-19 DIAGNOSIS — E8809 Other disorders of plasma-protein metabolism, not elsewhere classified: Secondary | ICD-10-CM

## 2013-06-19 NOTE — Telephone Encounter (Signed)
Pt wants to f/u with Dr Berna Bue and Told me to call his sister. I called Kathie Rhodes and told her someone will call her with his appts. She prefers wed at [facility] request sent.

## 2013-06-20 ENCOUNTER — Telehealth: Payer: Self-pay | Admitting: Internal Medicine

## 2013-06-20 NOTE — Telephone Encounter (Signed)
lvm on sister phone per pof with NOV appts.

## 2013-07-10 ENCOUNTER — Telehealth: Payer: Self-pay | Admitting: Internal Medicine

## 2013-07-10 ENCOUNTER — Other Ambulatory Visit: Payer: Medicare Other

## 2013-07-10 NOTE — Telephone Encounter (Signed)
pt wife called to r/s lab to friday...done

## 2013-07-12 ENCOUNTER — Other Ambulatory Visit: Payer: Medicare Other

## 2013-07-17 ENCOUNTER — Ambulatory Visit: Payer: Medicare Other | Admitting: Internal Medicine

## 2013-09-15 ENCOUNTER — Encounter (HOSPITAL_COMMUNITY): Payer: Self-pay | Admitting: Emergency Medicine

## 2013-09-15 ENCOUNTER — Emergency Department (HOSPITAL_COMMUNITY): Payer: Medicare Other

## 2013-09-15 ENCOUNTER — Emergency Department (HOSPITAL_COMMUNITY)
Admission: EM | Admit: 2013-09-15 | Discharge: 2013-09-15 | Disposition: A | Discharge status: Home / Self Care | Payer: Medicare Other | Attending: Emergency Medicine | Admitting: Emergency Medicine

## 2013-09-15 DIAGNOSIS — R918 Other nonspecific abnormal finding of lung field: Secondary | ICD-10-CM | POA: Insufficient documentation

## 2013-09-15 DIAGNOSIS — Z8639 Personal history of other endocrine, nutritional and metabolic disease: Secondary | ICD-10-CM | POA: Insufficient documentation

## 2013-09-15 DIAGNOSIS — R55 Syncope and collapse: Secondary | ICD-10-CM | POA: Insufficient documentation

## 2013-09-15 DIAGNOSIS — H5440 Blindness, one eye, unspecified eye: Secondary | ICD-10-CM | POA: Insufficient documentation

## 2013-09-15 DIAGNOSIS — Z862 Personal history of diseases of the blood and blood-forming organs and certain disorders involving the immune mechanism: Secondary | ICD-10-CM | POA: Insufficient documentation

## 2013-09-15 DIAGNOSIS — R9389 Abnormal findings on diagnostic imaging of other specified body structures: Secondary | ICD-10-CM

## 2013-09-15 DIAGNOSIS — F0390 Unspecified dementia without behavioral disturbance: Secondary | ICD-10-CM | POA: Insufficient documentation

## 2013-09-15 DIAGNOSIS — Z79899 Other long term (current) drug therapy: Secondary | ICD-10-CM | POA: Insufficient documentation

## 2013-09-15 DIAGNOSIS — F17200 Nicotine dependence, unspecified, uncomplicated: Secondary | ICD-10-CM | POA: Insufficient documentation

## 2013-09-15 DIAGNOSIS — I10 Essential (primary) hypertension: Secondary | ICD-10-CM | POA: Insufficient documentation

## 2013-09-15 LAB — CBC
HEMATOCRIT: 39.8 % (ref 39.0–52.0)
Hemoglobin: 13.9 g/dL (ref 13.0–17.0)
MCH: 31.7 pg (ref 26.0–34.0)
MCHC: 34.9 g/dL (ref 30.0–36.0)
MCV: 90.7 fL (ref 78.0–100.0)
Platelets: 124 10*3/uL — ABNORMAL LOW (ref 150–400)
RBC: 4.39 MIL/uL (ref 4.22–5.81)
RDW: 13.3 % (ref 11.5–15.5)
WBC: 4.3 10*3/uL (ref 4.0–10.5)

## 2013-09-15 LAB — URINALYSIS, ROUTINE W REFLEX MICROSCOPIC
Bilirubin Urine: NEGATIVE
GLUCOSE, UA: NEGATIVE mg/dL
Hgb urine dipstick: NEGATIVE
Ketones, ur: 15 mg/dL — AB
LEUKOCYTES UA: NEGATIVE
Nitrite: NEGATIVE
Protein, ur: 30 mg/dL — AB
Specific Gravity, Urine: 1.027 (ref 1.005–1.030)
Urobilinogen, UA: 0.2 mg/dL (ref 0.0–1.0)
pH: 5.5 (ref 5.0–8.0)

## 2013-09-15 LAB — COMPREHENSIVE METABOLIC PANEL
ALK PHOS: 60 U/L (ref 39–117)
ALT: 17 U/L (ref 0–53)
AST: 18 U/L (ref 0–37)
Albumin: 3.4 g/dL — ABNORMAL LOW (ref 3.5–5.2)
BUN: 16 mg/dL (ref 6–23)
CHLORIDE: 100 meq/L (ref 96–112)
CO2: 26 mEq/L (ref 19–32)
Calcium: 9.3 mg/dL (ref 8.4–10.5)
Creatinine, Ser: 1.31 mg/dL (ref 0.50–1.35)
GFR calc Af Amer: 64 mL/min — ABNORMAL LOW (ref >90)
GFR, EST NON AFRICAN AMERICAN: 56 mL/min — AB (ref >90)
Glucose, Bld: 75 mg/dL (ref 70–99)
Potassium: 3.8 mEq/L (ref 3.7–5.3)
Sodium: 138 mEq/L (ref 137–147)
Total Bilirubin: 0.5 mg/dL (ref 0.3–1.2)
Total Protein: 9 g/dL — ABNORMAL HIGH (ref 6.0–8.3)

## 2013-09-15 LAB — POCT I-STAT TROPONIN I: Troponin i, poc: 0.01 ng/mL (ref 0.00–0.08)

## 2013-09-15 LAB — URINE MICROSCOPIC-ADD ON

## 2013-09-15 LAB — PRO B NATRIURETIC PEPTIDE: Pro B Natriuretic peptide (BNP): 120.1 pg/mL (ref 0–125)

## 2013-09-15 LAB — TROPONIN I: Troponin I: <0.3 ng/mL (ref <0.30)

## 2013-09-15 NOTE — ED Notes (Signed)
Pt able to walk without difficulty. Denies dizziness. EDP notified.

## 2013-09-15 NOTE — ED Notes (Signed)
Pt arrived via EMS from home. Pt had witnessed syncopal episode. PT was out for a couple seconds. Family was able to shake him awake.  Hx of syncopal episode at Christmas. Denies NVD and chest pain, and weakness. 12 lead shows LVH according to EMS.  20 g L FA BP-136/70  HR-60 RR-20 SPO2-98% RA CBG- 107

## 2013-09-15 NOTE — Discharge Instructions (Signed)
Near-Syncope Near-syncope (commonly known as near fainting) is sudden weakness, dizziness, or feeling like you might pass out. This can happen when getting up or while standing for a long time. It is caused by a sudden decrease in blood flow to the brain, which can occur for various reasons. Most of the reasons are not serious.  HOME CARE Watch your condition for any changes.  Have someone stay with you until you feel stable.  If you feel like you are going to pass out:  Lie down right away.  Breathe deeply and steadily.  Move only when the feeling has gone away. Most of the time, this feeling lasts only a few minutes. You may feel tired for several hours.  Drink enough fluids to keep your pee (urine) clear or pale yellow.  If you are taking blood pressure or heart medicine, stand up slowly.  Follow up with your doctor as told. GET HELP RIGHT AWAY IF:   You have a severe headache.  You have unusual pain in the chest, belly (abdomen), or back.  You have bleeding from the mouth or butt (rectum), or you have black or tarry poop (stool).  You feel your heart beat differently than normal, or you have a very fast pulse.  You pass out, or you twitch and shake when you pass out.  You pass out when sitting or lying down.  You feel confused.  You have trouble walking.  You are weak.  You have vision problems. MAKE SURE YOU:   Understand these instructions.  Will watch your condition.  Will get help right away if you are not doing well or get worse. Document Released: 02/08/2008 Document Revised: 04/24/2013 Document Reviewed: 01/25/2013 [facility] Patient Information 2014 [place].  Pulmonary Nodule  A pulmonary nodule is a small, round spot in your lung. It is usually found when pictures of your lungs are taken for other reasons. Most pulmonary nodules are not cancerous and do not cause symptoms. Tests will be done to make sure the nodule is not cancerous. Pulmonary  nodules that are not cancerous usually do not require treatment. HOME CARE   Only take medicine as told by your doctor.  Follow up with your doctor as told. GET HELP IF:  You have trouble breathing when doing activities.  You feel sick or more tired than normal.  You do not feel like eating.  You lose weight without trying to.  You have chills.  You have night sweats. GET HELP RIGHT AWAY IF:  You cannot catch your breath.  You start making whistling sounds when breathing (wheezing).  You have a cough that does not go away.  You cough up blood.  You are dizzy or feel like you are going to pass out.  You have sudden chest pain.  You have a fever or lasting symptoms for more than 2 3 days.  You have a fever and your symptoms suddenly get worse. MAKE SURE YOU:  Understand these instructions.  Will watch your condition.  Will get help right away if you are not doing well or get worse. Document Released: 09/24/2010 Document Revised: 04/24/2013 Document Reviewed: 02/11/2013 [facility] Patient Information 2014 [place], [place].
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
## 2014-05-07 ENCOUNTER — Emergency Department (HOSPITAL_COMMUNITY)
Admission: EM | Admit: 2014-05-07 | Discharge: 2014-05-07 | Disposition: A | Discharge status: Home / Self Care | Payer: Medicare Other | Attending: Emergency Medicine | Admitting: Emergency Medicine

## 2014-05-07 ENCOUNTER — Encounter (HOSPITAL_COMMUNITY): Payer: Self-pay | Admitting: Emergency Medicine

## 2014-05-07 DIAGNOSIS — I10 Essential (primary) hypertension: Secondary | ICD-10-CM | POA: Diagnosis not present

## 2014-05-07 DIAGNOSIS — H5440 Blindness, one eye, unspecified eye: Secondary | ICD-10-CM | POA: Diagnosis not present

## 2014-05-07 DIAGNOSIS — Z862 Personal history of diseases of the blood and blood-forming organs and certain disorders involving the immune mechanism: Secondary | ICD-10-CM | POA: Diagnosis not present

## 2014-05-07 DIAGNOSIS — R5383 Other fatigue: Secondary | ICD-10-CM

## 2014-05-07 DIAGNOSIS — F17200 Nicotine dependence, unspecified, uncomplicated: Secondary | ICD-10-CM | POA: Insufficient documentation

## 2014-05-07 DIAGNOSIS — Y9289 Other specified places as the place of occurrence of the external cause: Secondary | ICD-10-CM | POA: Diagnosis not present

## 2014-05-07 DIAGNOSIS — R5381 Other malaise: Secondary | ICD-10-CM | POA: Insufficient documentation

## 2014-05-07 DIAGNOSIS — Z8639 Personal history of other endocrine, nutritional and metabolic disease: Secondary | ICD-10-CM | POA: Insufficient documentation

## 2014-05-07 DIAGNOSIS — Z8659 Personal history of other mental and behavioral disorders: Secondary | ICD-10-CM | POA: Insufficient documentation

## 2014-05-07 DIAGNOSIS — M2550 Pain in unspecified joint: Secondary | ICD-10-CM | POA: Diagnosis not present

## 2014-05-07 DIAGNOSIS — Y9389 Activity, other specified: Secondary | ICD-10-CM | POA: Insufficient documentation

## 2014-05-07 DIAGNOSIS — Z79899 Other long term (current) drug therapy: Secondary | ICD-10-CM | POA: Diagnosis not present

## 2014-05-07 DIAGNOSIS — F0390 Unspecified dementia without behavioral disturbance: Secondary | ICD-10-CM | POA: Diagnosis not present

## 2014-05-07 DIAGNOSIS — W08XXXA Fall from other furniture, initial encounter: Secondary | ICD-10-CM | POA: Diagnosis not present

## 2014-05-07 DIAGNOSIS — Z043 Encounter for examination and observation following other accident: Secondary | ICD-10-CM | POA: Insufficient documentation

## 2014-05-07 DIAGNOSIS — W19XXXA Unspecified fall, initial encounter: Secondary | ICD-10-CM

## 2014-05-07 LAB — COMPREHENSIVE METABOLIC PANEL
ALBUMIN: 3.1 g/dL — AB (ref 3.5–5.2)
ALT: 14 U/L (ref 0–53)
ANION GAP: 9 (ref 5–15)
AST: 13 U/L (ref 0–37)
Alkaline Phosphatase: 60 U/L (ref 39–117)
BUN: 12 mg/dL (ref 6–23)
CALCIUM: 9.2 mg/dL (ref 8.4–10.5)
CO2: 27 mEq/L (ref 19–32)
CREATININE: 1.6 mg/dL — AB (ref 0.50–1.35)
Chloride: 101 mEq/L (ref 96–112)
GFR calc Af Amer: 50 mL/min — ABNORMAL LOW (ref >90)
GFR calc non Af Amer: 43 mL/min — ABNORMAL LOW (ref >90)
Glucose, Bld: 90 mg/dL (ref 70–99)
Potassium: 3.9 mEq/L (ref 3.7–5.3)
Sodium: 137 mEq/L (ref 137–147)
TOTAL PROTEIN: 8.9 g/dL — AB (ref 6.0–8.3)
Total Bilirubin: 0.6 mg/dL (ref 0.3–1.2)

## 2014-05-07 LAB — CBC WITH DIFFERENTIAL/PLATELET
BASOS PCT: 0 % (ref 0–1)
Basophils Absolute: 0 10*3/uL (ref 0.0–0.1)
EOS PCT: 0 % (ref 0–5)
Eosinophils Absolute: 0 10*3/uL (ref 0.0–0.7)
HEMATOCRIT: 38.3 % — AB (ref 39.0–52.0)
HEMOGLOBIN: 13.1 g/dL (ref 13.0–17.0)
Lymphocytes Relative: 14 % (ref 12–46)
Lymphs Abs: 0.7 10*3/uL (ref 0.7–4.0)
MCH: 31.3 pg (ref 26.0–34.0)
MCHC: 34.2 g/dL (ref 30.0–36.0)
MCV: 91.6 fL (ref 78.0–100.0)
MONO ABS: 0.5 10*3/uL (ref 0.1–1.0)
MONOS PCT: 10 % (ref 3–12)
Neutro Abs: 3.6 10*3/uL (ref 1.7–7.7)
Neutrophils Relative %: 76 % (ref 43–77)
Platelets: 132 10*3/uL — ABNORMAL LOW (ref 150–400)
RBC: 4.18 MIL/uL — ABNORMAL LOW (ref 4.22–5.81)
RDW: 13.8 % (ref 11.5–15.5)
WBC: 4.8 10*3/uL (ref 4.0–10.5)

## 2014-05-07 LAB — I-STAT TROPONIN, ED: TROPONIN I, POC: 0.01 ng/mL (ref 0.00–0.08)

## 2014-05-07 NOTE — ED Provider Notes (Signed)
Medical screening examination/treatment/procedure(s) were performed by non-physician practitioner and as supervising physician I was immediately available for consultation/collaboration.  Ernestina Patches, MD 05/07/14 [phone]

## 2014-05-07 NOTE — Discharge Instructions (Signed)
Please follow up with primary care doctor as needed

## 2014-05-07 NOTE — ED Provider Notes (Signed)
CSN: [id number]     Arrival date & time 05/07/14  1024 History  This chart was scribed for non-physician practitioner, Renold Genta, PA-C, working with Ernestina Patches, MD by Ladene Artist, ED Scribe. This patient was seen in room TR11C/TR11C and the patient's care was started at 11:22 AM.   Chief Complaint  Patient presents with  . Fall  . Knee Pain   The history is provided by the patient. No language interpreter was used.   HPI Comments: Stephen Ellis is a 68 y.o. male who presents to the Emergency Department complaining of fall that occurred earlier today. Pt was sitting on a bench at [facility] when he fell to the ground. Pt suspects that he became weak from being in the sun all morning prior to falling. He denies hitting his head, LOC, dizziness, HA, chest pain, SOB. Pt is drinking a bottle of water during examination.   Past Medical History  Diagnosis Date  . Syncope   . Dehydration   . Blind left eye   . Hypertension   . Anemia     "low Blood"  . Dementia     poor memory   Past Surgical History  Procedure Laterality Date  . No past surgeries     No family history on file. History  Substance Use Topics  . Smoking status: Current Every Day Smoker -- 1.00 packs/day for 38 years  . Smokeless tobacco: Not on file  . Alcohol Use: No    Review of Systems  Respiratory: Negative for shortness of breath.   Cardiovascular: Negative for chest pain.  Musculoskeletal: Positive for arthralgias.  Neurological: Positive for weakness. Negative for dizziness, syncope and headaches.  All other systems reviewed and are negative.  Allergies  Review of patient's allergies indicates no known allergies.  Home Medications   Prior to Admission medications   Medication Sig Start Date End Date Taking? Authorizing Provider  amLODipine (NORVASC) 10 MG tablet Take 10 mg by mouth Daily. 02/08/12  Yes Historical Provider, MD   Triage Vitals: BP 124/91  Pulse 77  Temp(Src) 97.8 F  (36.6 C) (Oral)  Resp 12  Ht 5\' 8"  (1.727 m)  Wt 184 lb (83.462 kg)  BMI 27.98 kg/m2  SpO2 98% Physical Exam  Nursing note and vitals reviewed. Constitutional: He is oriented to person, place, and time. He appears well-developed and well-nourished.  HENT:  Head: Normocephalic and atraumatic.  Eyes: Conjunctivae and EOM are normal. Pupils are equal, round, and reactive to light.  Neck: Normal range of motion. Neck supple.  Cardiovascular: Normal rate.   Pulmonary/Chest: Effort normal.  Musculoskeletal: Normal range of motion. He exhibits no edema.  No cervical, thoracic, lumbar spine tenderness. Full range of motion of bilateral upper lower extremities.  Neurological: He is alert and oriented to person, place, and time.  Skin: Skin is warm and dry.  Psychiatric: He has a normal mood and affect. His behavior is normal.   ED Course  Procedures (including critical care time) DIAGNOSTIC STUDIES: Oxygen Saturation is 98% on RA, normal by my interpretation.    COORDINATION OF CARE: 11:26 AM-Discussed treatment plan which includes diagnostic lab work with pt at bedside and pt agreed to plan.   Labs Review Labs Reviewed  CBC WITH DIFFERENTIAL - Abnormal; Notable for the following:    RBC 4.18 (*)    HCT 38.3 (*)    Platelets 132 (*)    All other components within normal limits  COMPREHENSIVE METABOLIC PANEL -  Abnormal; Notable for the following:    Creatinine, Ser 1.60 (*)    Total Protein 8.9 (*)    Albumin 3.1 (*)    GFR calc non Af Amer 43 (*)    GFR calc Af Amer 50 (*)    All other components within normal limits  I-STAT TROPOININ, ED    Imaging Review No results found.   EKG Interpretation   Date/Time:  Wednesday May 07 2014 11:50:58 EDT Ventricular Rate:  61 PR Interval:  192 QRS Duration: 114 QT Interval:  418 QTC Calculation: 421 R Axis:   23 Text Interpretation:  Sinus rhythm Incomplete right bundle branch block  Nonspecific T abnormalities, lateral  leads No significant change since  last tracing Confirmed by [place] [phone]) on 05/07/2014 12:27:42  PM      MDM   Final diagnoses:  Fall, initial encounter    Patient with history of dementia, poor historian. He has history of syncope and dehydration. He was brought to emergency department after he fell from sitting position from a bench at [facility]. Patient unable to time and why he fell but he does say that he remembers feeling weak and thinks he might be overheated. Lab work including EKG, troponin obtained to make sure all electrolytes are normal and to make sure this is moderate cardiac problems. EKG showing some inverted T waves but unchanged from prior. Troponin negative. No significant electrolyte abnormalities. Patient's heart rate did increase with standing on orthostatic vital signs however he does not appear to be significantly orthostatic. He has no complaints at this time. He ambulated in the hallway with no assistance and no issues. At this time we'll discharge home with close outpatient followup. No injuries during the fall. No head injury.  Filed Vitals:   05/07/14 1245 05/07/14 1300 05/07/14 1315 05/07/14 1335  BP: 132/95 129/84 133/94 148/99  Pulse: 51 52 56 61  Temp:    98.8 F (37.1 C)  TempSrc:    Oral  Resp: 26 26 23 23   Height:      Weight:      SpO2: 100% 100% 100% 100%     I personally performed the services described in this documentation, which was scribed in my presence. The recorded information has been reviewed and is accurate.    Renold Genta, PA-C 05/07/14 1622
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
## 2014-07-16 ENCOUNTER — Emergency Department (HOSPITAL_COMMUNITY): Payer: Medicare Other

## 2014-07-16 ENCOUNTER — Emergency Department (HOSPITAL_COMMUNITY)
Admission: EM | Admit: 2014-07-16 | Discharge: 2014-07-16 | Disposition: A | Discharge status: Home / Self Care | Payer: Medicare Other | Attending: Emergency Medicine | Admitting: Emergency Medicine

## 2014-07-16 ENCOUNTER — Encounter (HOSPITAL_COMMUNITY): Payer: Self-pay | Admitting: Emergency Medicine

## 2014-07-16 DIAGNOSIS — I10 Essential (primary) hypertension: Secondary | ICD-10-CM | POA: Diagnosis not present

## 2014-07-16 DIAGNOSIS — Y9289 Other specified places as the place of occurrence of the external cause: Secondary | ICD-10-CM | POA: Insufficient documentation

## 2014-07-16 DIAGNOSIS — F0390 Unspecified dementia without behavioral disturbance: Secondary | ICD-10-CM | POA: Diagnosis not present

## 2014-07-16 DIAGNOSIS — R918 Other nonspecific abnormal finding of lung field: Secondary | ICD-10-CM | POA: Diagnosis not present

## 2014-07-16 DIAGNOSIS — S3991XA Unspecified injury of abdomen, initial encounter: Secondary | ICD-10-CM | POA: Insufficient documentation

## 2014-07-16 DIAGNOSIS — Z862 Personal history of diseases of the blood and blood-forming organs and certain disorders involving the immune mechanism: Secondary | ICD-10-CM | POA: Insufficient documentation

## 2014-07-16 DIAGNOSIS — S3992XA Unspecified injury of lower back, initial encounter: Secondary | ICD-10-CM | POA: Insufficient documentation

## 2014-07-16 DIAGNOSIS — Y9389 Activity, other specified: Secondary | ICD-10-CM | POA: Diagnosis not present

## 2014-07-16 DIAGNOSIS — Z8639 Personal history of other endocrine, nutritional and metabolic disease: Secondary | ICD-10-CM | POA: Diagnosis not present

## 2014-07-16 DIAGNOSIS — H5442 Blindness, left eye, normal vision right eye: Secondary | ICD-10-CM | POA: Insufficient documentation

## 2014-07-16 DIAGNOSIS — Y998 Other external cause status: Secondary | ICD-10-CM | POA: Diagnosis not present

## 2014-07-16 DIAGNOSIS — Z720 Tobacco use: Secondary | ICD-10-CM | POA: Insufficient documentation

## 2014-07-16 DIAGNOSIS — S8992XA Unspecified injury of left lower leg, initial encounter: Secondary | ICD-10-CM | POA: Diagnosis not present

## 2014-07-16 DIAGNOSIS — M549 Dorsalgia, unspecified: Secondary | ICD-10-CM

## 2014-07-16 DIAGNOSIS — R531 Weakness: Secondary | ICD-10-CM

## 2014-07-16 DIAGNOSIS — Z79899 Other long term (current) drug therapy: Secondary | ICD-10-CM | POA: Insufficient documentation

## 2014-07-16 DIAGNOSIS — W19XXXA Unspecified fall, initial encounter: Secondary | ICD-10-CM

## 2014-07-16 DIAGNOSIS — R109 Unspecified abdominal pain: Secondary | ICD-10-CM

## 2014-07-16 LAB — COMPREHENSIVE METABOLIC PANEL
ALBUMIN: 2.9 g/dL — AB (ref 3.5–5.2)
ALT: 18 U/L (ref 0–53)
AST: 20 U/L (ref 0–37)
Alkaline Phosphatase: 62 U/L (ref 39–117)
Anion gap: 13 (ref 5–15)
BUN: 12 mg/dL (ref 6–23)
CALCIUM: 9.7 mg/dL (ref 8.4–10.5)
CO2: 24 mEq/L (ref 19–32)
Chloride: 104 mEq/L (ref 96–112)
Creatinine, Ser: 1.38 mg/dL — ABNORMAL HIGH (ref 0.50–1.35)
GFR calc Af Amer: 60 mL/min — ABNORMAL LOW (ref >90)
GFR calc non Af Amer: 52 mL/min — ABNORMAL LOW (ref >90)
Glucose, Bld: 109 mg/dL — ABNORMAL HIGH (ref 70–99)
Potassium: 3.8 mEq/L (ref 3.7–5.3)
SODIUM: 141 meq/L (ref 137–147)
TOTAL PROTEIN: 9 g/dL — AB (ref 6.0–8.3)
Total Bilirubin: 0.4 mg/dL (ref 0.3–1.2)

## 2014-07-16 LAB — URINALYSIS, ROUTINE W REFLEX MICROSCOPIC
Bilirubin Urine: NEGATIVE
GLUCOSE, UA: NEGATIVE mg/dL
HGB URINE DIPSTICK: NEGATIVE
Ketones, ur: NEGATIVE mg/dL
Leukocytes, UA: NEGATIVE
Nitrite: NEGATIVE
PH: 5.5 (ref 5.0–8.0)
Protein, ur: 30 mg/dL — AB
Specific Gravity, Urine: 1.025 (ref 1.005–1.030)
Urobilinogen, UA: 1 mg/dL (ref 0.0–1.0)

## 2014-07-16 LAB — CBG MONITORING, ED: Glucose-Capillary: 127 mg/dL — ABNORMAL HIGH (ref 70–99)

## 2014-07-16 LAB — I-STAT TROPONIN, ED: Troponin i, poc: 0.02 ng/mL (ref 0.00–0.08)

## 2014-07-16 LAB — CBC WITH DIFFERENTIAL/PLATELET
BASOS ABS: 0 10*3/uL (ref 0.0–0.1)
Basophils Relative: 0 % (ref 0–1)
EOS PCT: 0 % (ref 0–5)
Eosinophils Absolute: 0 10*3/uL (ref 0.0–0.7)
HCT: 37.6 % — ABNORMAL LOW (ref 39.0–52.0)
Hemoglobin: 12.7 g/dL — ABNORMAL LOW (ref 13.0–17.0)
Lymphocytes Relative: 18 % (ref 12–46)
Lymphs Abs: 0.7 10*3/uL (ref 0.7–4.0)
MCH: 30.2 pg (ref 26.0–34.0)
MCHC: 33.8 g/dL (ref 30.0–36.0)
MCV: 89.3 fL (ref 78.0–100.0)
Monocytes Absolute: 0.5 10*3/uL (ref 0.1–1.0)
Monocytes Relative: 14 % — ABNORMAL HIGH (ref 3–12)
Neutro Abs: 2.4 10*3/uL (ref 1.7–7.7)
Neutrophils Relative %: 68 % (ref 43–77)
PLATELETS: 130 10*3/uL — AB (ref 150–400)
RBC: 4.21 MIL/uL — ABNORMAL LOW (ref 4.22–5.81)
RDW: 13.2 % (ref 11.5–15.5)
WBC: 3.5 10*3/uL — AB (ref 4.0–10.5)

## 2014-07-16 LAB — ETHANOL: Alcohol, Ethyl (B): <11 mg/dL (ref 0–11)

## 2014-07-16 LAB — URINE MICROSCOPIC-ADD ON

## 2014-07-16 MED ORDER — IOHEXOL 350 MG/ML SOLN
100.0000 mL | Freq: Once | INTRAVENOUS | Status: AC | PRN
  Administered 2014-07-16: 100 mL via INTRAVENOUS

## 2014-07-16 MED ORDER — IOHEXOL 300 MG/ML  SOLN
50.0000 mL | Freq: Once | INTRAMUSCULAR | Status: AC | PRN
  Administered 2014-07-16: 50 mL via ORAL

## 2014-07-16 MED ORDER — SODIUM CHLORIDE 0.9 % IV BOLUS (SEPSIS)
1000.0000 mL | Freq: Once | INTRAVENOUS | Status: AC
  Administered 2014-07-16: 1000 mL via INTRAVENOUS

## 2014-07-16 NOTE — ED Notes (Signed)
cbg 127

## 2014-07-16 NOTE — Discharge Instructions (Signed)
Take tylenol, motrin for pain.   You have an appointment with Dr. Melvyn Novas next Friday, 11/20 at [facility]. Please go to the appointment. You will need bronchoscopy for biopsy.   Return to ER if you have trouble breathing, shortness of breath, worse weakness, fever.

## 2014-07-16 NOTE — ED Notes (Signed)
Brought in by EMS from [facility] with c/o "just feeling sick".  Pt reported that he has been "feeling sick and weak" since early this morning.  Pt denies pain or fever.

## 2014-07-16 NOTE — ED Notes (Signed)
Bed: WA21 Expected date:  Expected time:  Means of arrival:  Comments: EMS- "just feels sick"

## 2014-07-16 NOTE — ED Notes (Signed)
Patient aware urine sample is needed, will notify us when he is ready to give.

## 2014-07-16 NOTE — ED Notes (Signed)
Pt's contact:  Claiborne Billings (sister)--- tel# [phone]                        Sherlynn Stalls ---- tel# [phone]
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
## 2014-07-25 ENCOUNTER — Encounter: Payer: Self-pay | Admitting: Internal Medicine

## 2014-07-25 ENCOUNTER — Ambulatory Visit (INDEPENDENT_AMBULATORY_CARE_PROVIDER_SITE_OTHER): Payer: Medicare Other | Admitting: Internal Medicine

## 2014-07-25 VITALS — BP 162/100 | HR 70 | Ht 69.25 in | Wt 185.0 lb

## 2014-07-25 DIAGNOSIS — R918 Other nonspecific abnormal finding of lung field: Secondary | ICD-10-CM

## 2014-07-25 MED ORDER — ACETAMINOPHEN-CODEINE #3 300-30 MG PO TABS: ORAL_TABLET | ORAL | Status: DC

## 2014-07-25 NOTE — Progress Notes (Signed)
Subjective:     Patient ID: Stephen Ellis, male   DOB: 24-Oct-1945,  MRN: [id number]  HPI  53 yobm quit smoking early Nov 2015 seen in ER 11/11/5  p "fell" with w/u c/w R paratracheal mass which on CT represented collapsed RUL so referred to pulmonary clinic 07/25/2014    07/25/2014 1st [facility] office visit/ Stephen Ellis   Chief Complaint  Patient presents with  . Pulmonary Consult    Referred by Dr. Darl Householder for eval of lung mass. Pt c/o cough for the past 2 months- non prod and worse when he lies down. He also c/o CP when he coughs.    onset was insidious cough / assoc with R CP just to lateral sternal/ no hemoptysis Feels worse lying down unless rolls over to L side  No obvious day to day or daytime variabilty or chest tightness, subjective wheeze overt sinus or hb symptoms. No unusual exp hx or h/o childhood pna/ asthma or knowledge of premature birth.  Sleeping ok without nocturnal  or early am exacerbation  of respiratory  c/o's or need for noct saba. Also denies any obvious fluctuation of symptoms with weather or environmental changes or other aggravating or alleviating factors except as outlined above   Current Medications, Allergies, Complete Past Medical History, Past Surgical History, Family History, and Social History were reviewed in [employer] record.  ROS  The following are not active complaints unless bolded sore throat, dysphagia, dental problems, itching, sneezing,  nasal congestion or excess/ purulent secretions, ear ache,   fever, chills, sweats, unintended wt loss, pleuritic or exertional cp, hemoptysis,  orthopnea pnd or leg swelling, presyncope, palpitations, heartburn, abdominal pain, anorexia, nausea, vomiting, diarrhea  or change in bowel or urinary habits, change in stools or urine, dysuria,hematuria,  rash, arthralgias, visual complaints, headache, numbness weakness or ataxia or problems with walking or coordination,  change in mood/affect or  memory.               Review of Systems     Objective:   Physical Exam     Wt Readings from Last 3 Encounters:  07/25/14 185 lb (83.915 kg)  07/16/14 170 lb (77.111 kg)  05/07/14 184 lb (83.462 kg)    Vital signs reviewed   HEENT: nl dentition, turbinates, and orophanx. Nl external ear canals without cough reflex   NECK :  without JVD/Nodes/TM/ nl carotid upstrokes bilaterally   LUNGS: no acc muscle use, prominent upper airway rhonchi on R    CV:  RRR  no s3 or murmur or increase in P2, no edema   ABD:  soft and nontender with nl excursion in the supine position. No bruits or organomegaly, bowel sounds nl  MS:  warm without deformities, calf tenderness, cyanosis or clubbing  SKIN: warm and dry without lesions    NEURO:  alert, approp, no deficits    CTa 07/16/14 CTA of the chest: Changes consistent with a centrally obstructing lesion involving the right upper lobe bronchus with distal right upper lobe collapse.  Assessment:

## 2014-07-25 NOTE — Assessment & Plan Note (Signed)
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   In meantime rx symptoms with elevation of hob and tylenol #3  See instructions for specific recommendations which were reviewed directly with the patient who was given a copy with highlighter outlining the key components.

## 2014-07-25 NOTE — Patient Instructions (Addendum)
Come to outpatient registration at [facility] (behind the ER) at 715 am Tuesday Nov 24  with nothing to eat or drink after midnight Monday.for Bronchoscopy with biopsy    For pain or cough use tylenol #3 one every 4 hours as needed

## 2014-07-29 ENCOUNTER — Ambulatory Visit (HOSPITAL_COMMUNITY)
Admission: RE | Admit: 2014-07-29 | Discharge: 2014-07-29 | Disposition: A | Discharge status: Home / Self Care | Payer: Medicare Other | Source: Ambulatory Visit | Attending: Internal Medicine | Admitting: Internal Medicine

## 2014-07-29 ENCOUNTER — Encounter (HOSPITAL_COMMUNITY): Payer: Self-pay | Admitting: Radiology

## 2014-07-29 ENCOUNTER — Encounter (HOSPITAL_COMMUNITY)
Admission: RE | Disposition: A | Discharge status: Home / Self Care | Payer: Self-pay | Source: Ambulatory Visit | Attending: Internal Medicine

## 2014-07-29 DIAGNOSIS — J984 Other disorders of lung: Secondary | ICD-10-CM | POA: Diagnosis present

## 2014-07-29 DIAGNOSIS — J40 Bronchitis, not specified as acute or chronic: Secondary | ICD-10-CM | POA: Diagnosis not present

## 2014-07-29 DIAGNOSIS — Z87891 Personal history of nicotine dependence: Secondary | ICD-10-CM | POA: Insufficient documentation

## 2014-07-29 DIAGNOSIS — R918 Other nonspecific abnormal finding of lung field: Secondary | ICD-10-CM | POA: Diagnosis present

## 2014-07-29 HISTORY — PX: VIDEO BRONCHOSCOPY: SHX5072

## 2014-07-29 SURGERY — VIDEO BRONCHOSCOPY WITHOUT FLUORO
Anesthesia: Moderate Sedation | Laterality: Bilateral

## 2014-07-29 MED ORDER — PHENYLEPHRINE HCL 0.25 % NA SOLN
NASAL | Status: DC | PRN
  Administered 2014-07-29: 2 via NASAL

## 2014-07-29 MED ORDER — MIDAZOLAM HCL 10 MG/2ML IJ SOLN
INTRAMUSCULAR | Status: DC | PRN
  Administered 2014-07-29 (×3): 2.5 mg via INTRAVENOUS

## 2014-07-29 MED ORDER — LIDOCAINE HCL (PF) 1 % IJ SOLN
INTRAMUSCULAR | Status: DC | PRN
  Administered 2014-07-29: 6 mL

## 2014-07-29 MED ORDER — MEPERIDINE HCL 25 MG/ML IJ SOLN
INTRAMUSCULAR | Status: DC | PRN
  Administered 2014-07-29 (×2): 50 mg via INTRAVENOUS

## 2014-07-29 MED ORDER — LIDOCAINE HCL 2 % EX GEL
CUTANEOUS | Status: DC | PRN
  Administered 2014-07-29: 1

## 2014-07-29 MED ORDER — MEPERIDINE HCL 100 MG/ML IJ SOLN
INTRAMUSCULAR | Status: AC
  Filled 2014-07-29: qty 2

## 2014-07-29 MED ORDER — SODIUM CHLORIDE 0.9 % IV SOLN: INTRAVENOUS | Status: DC

## 2014-07-29 MED ORDER — MIDAZOLAM HCL 10 MG/2ML IJ SOLN
INTRAMUSCULAR | Status: AC
  Filled 2014-07-29: qty 4

## 2014-07-29 NOTE — H&P (Signed)
Patient ID: Stephen Ellis, male DOB: [birth year], MRN: [id number]  HPI  31 yobm quit smoking early Nov 2015 seen in ER 11/11/5 p "fell" with w/u c/w R paratracheal mass which on CT represented collapsed RUL so referred to pulmonary clinic 07/25/2014    07/25/2014 1st [place] [place] [facility] office visit/ Mialynn Shelvin  Chief Complaint  Patient presents with  . Pulmonary Consult    Referred by Dr. Darl Householder for eval of lung mass. Pt c/o cough for the past 2 months- non prod and worse when he lies down. He also c/o CP when he coughs.   onset was insidious cough / assoc with R CP just to lateral sternal/ no hemoptysis Feels worse lying down unless rolls over to L side  No obvious day to day or daytime variabilty or chest tightness, subjective wheeze overt sinus or hb symptoms. No unusual exp hx or h/o childhood pna/ asthma or knowledge of premature birth.  Sleeping ok without nocturnal or early am exacerbation of respiratory c/o's or need for noct saba. Also denies any obvious fluctuation of symptoms with weather or environmental changes or other aggravating or alleviating factors except as outlined above   Current Medications, Allergies, Complete Past Medical History, Past Surgical History, Family History, and Social History were reviewed in [employer] record.  ROS The following are not active complaints unless bolded sore throat, dysphagia, dental problems, itching, sneezing, nasal congestion or excess/ purulent secretions, ear ache, fever, chills, sweats, unintended wt loss, pleuritic or exertional cp, hemoptysis, orthopnea pnd or leg swelling, presyncope, palpitations, heartburn, abdominal pain, anorexia, nausea, vomiting, diarrhea or change in bowel or urinary habits, change in stools or urine, dysuria,hematuria, rash, arthralgias, visual complaints, headache, numbness weakness or ataxia or problems with walking or coordination, change in mood/affect or memory.                   Objective:   Physical Exam     Wt Readings from Last 3 Encounters:  07/25/14 185 lb (83.915 kg)  07/16/14 170 lb (77.111 kg)  05/07/14 184 lb (83.462 kg)    Vital signs reviewed   HEENT: nl dentition, turbinates, and orophanx. Nl external ear canals without cough reflex   NECK : without JVD/Nodes/TM/ nl carotid upstrokes bilaterally   LUNGS: no acc muscle use, prominent upper airway rhonchi on R    CV: RRR no s3 or murmur or increase in P2, no edema   ABD: soft and nontender with nl excursion in the supine position. No bruits or organomegaly, bowel sounds nl  MS: warm without deformities, calf tenderness, cyanosis or clubbing  SKIN: warm and dry without lesions   NEURO: alert, approp, no deficits   CTa 07/16/14 CTA of the chest: Changes consistent with a centrally obstructing lesion involving the right upper lobe bronchus with distal right upper lobe collapse.  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   In meantime rx symptoms with elevation of hob and tylenol #3  See instructions for specific recommendations which were reviewed directly with the patient who was given a copy with highlighter outlining the key components.             07/29/2014  Day of FOB no change in hx or exam     Christinia Gully, MD Pulmonary and [place] [phone] After 5:30 PM or weekends, call [phone]

## 2014-07-29 NOTE — Progress Notes (Signed)
Video bronchoscopy with washing intervention, biopsy intervention. Pt. Did well thru out procedure. Gave report to Roe Coombs his care giver, no aspirin only tylenol, should rest all day. [name] is going to stay with pt. Till 3:00 pm. Should start drinking with water then can eat.

## 2014-07-29 NOTE — Op Note (Signed)
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 The risks, benefits, complications, treatment options and expected outcomes were discussed with the patient.  The possibilities of reaction to medication, pulmonary aspiration, perforation of a viscus, bleeding, failure to diagnose a condition and creating a complication requiring transfusion or operation were discussed with the patient who freely signed the consent.    Description of Procedure: The patient was re-examined in the bronchoscopy suite and the site of surgery properly noted/marked.  The patient was identified  and the procedure verified as Flexible Fiberoptic Bronchoscopy.  A Time Out was held and the above information confirmed.   After the induction of topical nasopharyngeal anesthesia, the patient was positioned  and the bronchoscope was passed through the R naris. The vocal cords were visualized and  1% buffered lidocaine 5 ml was topically placed onto the cords. The cords were nl. The scope was then passed into the trachea.  1% buffered lidocaine given topically. Airways inspected bilaterally to the subsegmental level with the following findings:  All airways nl except for  mass obst RUL 100% friable, with ext into bronchus intermdius which was 75% or nl caliber   Interventions:  Lavage RMSB Endobronchial bx RUL orifice x 4     The Patient was taken to the Endoscopy Recovery area in satisfactory condition.   Attestation: I performed  the procedure.  Christinia Gully, MD Pulmonary and [place] [phone] After 5:30 PM or weekends, call [phone]

## 2014-07-30 ENCOUNTER — Encounter (HOSPITAL_COMMUNITY): Payer: Self-pay | Admitting: Internal Medicine

## 2014-08-05 ENCOUNTER — Telehealth: Payer: Self-pay | Admitting: Internal Medicine

## 2014-08-05 DIAGNOSIS — R918 Other nonspecific abnormal finding of lung field: Secondary | ICD-10-CM

## 2014-08-05 NOTE — Telephone Encounter (Signed)
Talked to pt's nephew as pt really can't understand over the phone

## 2014-08-05 NOTE — Telephone Encounter (Signed)
Will forward to MW per his request  Thanks

## 2014-08-07 ENCOUNTER — Inpatient Hospital Stay (HOSPITAL_COMMUNITY)
Admission: EM | Admit: 2014-08-07 | Discharge: 2014-08-09 | DRG: 194 | Disposition: A | Discharge status: Home / Self Care | Payer: Medicare Other | Attending: Internal Medicine | Admitting: Internal Medicine

## 2014-08-07 ENCOUNTER — Emergency Department (HOSPITAL_COMMUNITY): Payer: Medicare Other

## 2014-08-07 ENCOUNTER — Encounter (HOSPITAL_COMMUNITY): Payer: Self-pay

## 2014-08-07 DIAGNOSIS — F0390 Unspecified dementia without behavioral disturbance: Secondary | ICD-10-CM | POA: Diagnosis present

## 2014-08-07 DIAGNOSIS — H5442 Blindness, left eye, normal vision right eye: Secondary | ICD-10-CM | POA: Diagnosis present

## 2014-08-07 DIAGNOSIS — Z7982 Long term (current) use of aspirin: Secondary | ICD-10-CM | POA: Diagnosis not present

## 2014-08-07 DIAGNOSIS — Y95 Nosocomial condition: Secondary | ICD-10-CM | POA: Diagnosis present

## 2014-08-07 DIAGNOSIS — R059 Cough, unspecified: Secondary | ICD-10-CM

## 2014-08-07 DIAGNOSIS — J189 Pneumonia, unspecified organism: Principal | ICD-10-CM

## 2014-08-07 DIAGNOSIS — I10 Essential (primary) hypertension: Secondary | ICD-10-CM

## 2014-08-07 DIAGNOSIS — F05 Delirium due to known physiological condition: Secondary | ICD-10-CM | POA: Diagnosis present

## 2014-08-07 DIAGNOSIS — C3411 Malignant neoplasm of upper lobe, right bronchus or lung: Secondary | ICD-10-CM | POA: Diagnosis not present

## 2014-08-07 DIAGNOSIS — C3410 Malignant neoplasm of upper lobe, unspecified bronchus or lung: Secondary | ICD-10-CM

## 2014-08-07 DIAGNOSIS — Z7901 Long term (current) use of anticoagulants: Secondary | ICD-10-CM

## 2014-08-07 DIAGNOSIS — R05 Cough: Secondary | ICD-10-CM

## 2014-08-07 DIAGNOSIS — Z85118 Personal history of other malignant neoplasm of bronchus and lung: Secondary | ICD-10-CM | POA: Diagnosis not present

## 2014-08-07 DIAGNOSIS — D649 Anemia, unspecified: Secondary | ICD-10-CM | POA: Diagnosis present

## 2014-08-07 DIAGNOSIS — E860 Dehydration: Secondary | ICD-10-CM | POA: Diagnosis present

## 2014-08-07 DIAGNOSIS — Z87891 Personal history of nicotine dependence: Secondary | ICD-10-CM

## 2014-08-07 LAB — BASIC METABOLIC PANEL
ANION GAP: 13 (ref 5–15)
BUN: 13 mg/dL (ref 6–23)
CHLORIDE: 98 meq/L (ref 96–112)
CO2: 23 mEq/L (ref 19–32)
Calcium: 9.2 mg/dL (ref 8.4–10.5)
Creatinine, Ser: 1.36 mg/dL — ABNORMAL HIGH (ref 0.50–1.35)
GFR calc non Af Amer: 53 mL/min — ABNORMAL LOW (ref >90)
GFR, EST AFRICAN AMERICAN: 61 mL/min — AB (ref >90)
Glucose, Bld: 100 mg/dL — ABNORMAL HIGH (ref 70–99)
POTASSIUM: 3.9 meq/L (ref 3.7–5.3)
Sodium: 134 mEq/L — ABNORMAL LOW (ref 137–147)

## 2014-08-07 LAB — CBC WITH DIFFERENTIAL/PLATELET
Basophils Absolute: 0 10*3/uL (ref 0.0–0.1)
Basophils Relative: 0 % (ref 0–1)
Eosinophils Absolute: 0 10*3/uL (ref 0.0–0.7)
Eosinophils Relative: 0 % (ref 0–5)
HCT: 38.5 % — ABNORMAL LOW (ref 39.0–52.0)
Hemoglobin: 13.3 g/dL (ref 13.0–17.0)
LYMPHS PCT: 9 % — AB (ref 12–46)
Lymphs Abs: 0.7 10*3/uL (ref 0.7–4.0)
MCH: 31.1 pg (ref 26.0–34.0)
MCHC: 34.5 g/dL (ref 30.0–36.0)
MCV: 90.2 fL (ref 78.0–100.0)
MONO ABS: 0.5 10*3/uL (ref 0.1–1.0)
Monocytes Relative: 7 % (ref 3–12)
NEUTROS ABS: 6.6 10*3/uL (ref 1.7–7.7)
Neutrophils Relative %: 84 % — ABNORMAL HIGH (ref 43–77)
Platelets: 127 10*3/uL — ABNORMAL LOW (ref 150–400)
RBC: 4.27 MIL/uL (ref 4.22–5.81)
RDW: 12.9 % (ref 11.5–15.5)
WBC: 7.9 10*3/uL (ref 4.0–10.5)

## 2014-08-07 LAB — TROPONIN I

## 2014-08-07 LAB — LACTIC ACID, PLASMA: Lactic Acid, Venous: 0.8 mmol/L (ref 0.5–2.2)

## 2014-08-07 MED ORDER — ACETAMINOPHEN 325 MG PO TABS
650.0000 mg | ORAL_TABLET | Freq: Once | ORAL | Status: AC
  Administered 2014-08-07: 650 mg via ORAL
  Filled 2014-08-07: qty 2

## 2014-08-07 MED ORDER — PIPERACILLIN-TAZOBACTAM 3.375 G IVPB 30 MIN
3.3750 g | Freq: Once | INTRAVENOUS | Status: AC
  Administered 2014-08-07: 3.375 g via INTRAVENOUS
  Filled 2014-08-07: qty 50

## 2014-08-07 MED ORDER — SODIUM CHLORIDE 0.9 % IV BOLUS (SEPSIS)
1000.0000 mL | Freq: Once | INTRAVENOUS | Status: AC
  Administered 2014-08-07: 1000 mL via INTRAVENOUS

## 2014-08-07 MED ORDER — VANCOMYCIN HCL IN DEXTROSE 1-5 GM/200ML-% IV SOLN
1000.0000 mg | Freq: Once | INTRAVENOUS | Status: AC
  Administered 2014-08-07: 1000 mg via INTRAVENOUS
  Filled 2014-08-07: qty 200

## 2014-08-07 MED ORDER — ONDANSETRON HCL 4 MG/2ML IJ SOLN: 4.0000 mg | Freq: Three times a day (TID) | INTRAMUSCULAR | Status: DC | PRN

## 2014-08-07 NOTE — ED Provider Notes (Signed)
CSN: [id number]     Arrival date & time 08/07/14  1911 History   First MD Initiated Contact with Patient 08/07/14 1927     Chief Complaint  Patient presents with  . Cough   Level V caveat - dementia.   Stephen Ellis is a 68 y.o. male with a history of dementia, right paratracheal lung mass, and hypertension who presents to emergency department complaining of a cough for the past several months that is worse in the past 2 weeks. Patient also reports he's having substernal chest pain only when coughing. He reports associated shortness of breath and wheezing. He denies fevers or chills. Patient was a chest pain started last night. Pain is not exertional. Patient reports he is a former smoker and has had a cough for about a year now. Patient has history dementia and states he lives at home by himself. Patient has a brother and sister in town and he states he will call them to have him come in to the ED.  The friend who came to see him reports that he thinks he lives at home, but also spends a lot of time at his sister's house. The patient denies chest pain, palpitations, abdominal pain, vomiting, rashes, fevers, or chills.  (Consider location/radiation/quality/duration/timing/severity/associated sxs/prior Treatment) HPI  Past Medical History  Diagnosis Date  . Syncope   . Dehydration   . Blind left eye   . Hypertension   . Anemia     "low Blood"  . Dementia     poor memory   Past Surgical History  Procedure Laterality Date  . No past surgeries    . Video bronchoscopy Bilateral 07/29/2014    Procedure: VIDEO BRONCHOSCOPY WITHOUT FLUORO;  Surgeon: Tanda Rockers, MD;  Location: [facility];  Service: Cardiopulmonary;  Laterality: Bilateral;   History reviewed. No pertinent family history. History  Substance Use Topics  . Smoking status: Former Smoker -- 1.00 packs/day for 38 years    Quit date: 06/24/2014  . Smokeless tobacco: Never Used  . Alcohol Use: No    Review of Systems   Unable to perform ROS Constitutional: Negative for fever, chills and appetite change.  HENT: Negative for ear pain and sore throat.   Respiratory: Positive for cough, shortness of breath and wheezing.   Cardiovascular: Positive for chest pain. Negative for palpitations and leg swelling.  Gastrointestinal: Negative for nausea, vomiting and abdominal pain.  Neurological: Negative for dizziness, light-headedness and headaches.      Allergies  Review of patient's allergies indicates no known allergies.  Home Medications   Prior to Admission medications   Medication Sig Start Date End Date Taking? Authorizing Provider  PRESCRIPTION MEDICATION Take 1 tablet by mouth at bedtime.   Yes Historical Provider, MD   BP 128/91 mmHg  Pulse 75  Temp(Src) 98.7 F (37.1 C) (Oral)  Resp 26  SpO2 100% Physical Exam  Constitutional: He appears well-developed and well-nourished. No distress.  HENT:  Head: Normocephalic and atraumatic.  Right Ear: External ear normal.  Left Ear: External ear normal.  Nose: Nose normal.  Mouth/Throat: Oropharynx is clear and moist. No oropharyngeal exudate.  Eyes: Conjunctivae are normal. Pupils are equal, round, and reactive to light. Right eye exhibits no discharge. Left eye exhibits no discharge.  Neck: Neck supple.  Cardiovascular: Normal rate, regular rhythm, normal heart sounds and intact distal pulses.  Exam reveals no gallop and no friction rub.   No murmur heard. Pulmonary/Chest: Effort normal. No respiratory distress. He has  no wheezes. He has rales. He exhibits no tenderness.  Diminished on the right.   Abdominal: Soft. He exhibits no distension. There is no tenderness.  Musculoskeletal: He exhibits no edema.  Lymphadenopathy:    He has no cervical adenopathy.  Neurological: He is alert. Coordination normal.  Patient is oriented to person and place.   Skin: Skin is warm and dry. No rash noted. He is not diaphoretic. No erythema. No pallor.   Psychiatric: He has a normal mood and affect. His behavior is normal.  Nursing note and vitals reviewed.   ED Course  Procedures (including critical care time) Labs Review Labs Reviewed  CBC WITH DIFFERENTIAL - Abnormal; Notable for the following:    HCT 38.5 (*)    Platelets 127 (*)    Neutrophils Relative % 84 (*)    Lymphocytes Relative 9 (*)    All other components within normal limits  BASIC METABOLIC PANEL - Abnormal; Notable for the following:    Sodium 134 (*)    Glucose, Bld 100 (*)    Creatinine, Ser 1.36 (*)    GFR calc non Af Amer 53 (*)    GFR calc Af Amer 61 (*)    All other components within normal limits  CULTURE, BLOOD (ROUTINE X 2)  CULTURE, BLOOD (ROUTINE X 2)  TROPONIN I  LACTIC ACID, PLASMA    Imaging Review Dg Chest 2 View  08/07/2014   CLINICAL DATA:  Productive cough for 1 week  EXAM: CHEST  2 VIEW  COMPARISON:  07/16/2014  FINDINGS: Right upper lobe mass with right upper lobe collapse is stable. Normal heart size. Lungs otherwise clear. No pneumothorax. No pleural effusion.  IMPRESSION: Stable right upper lobe mass and collapse.   Electronically Signed   By: Maryclare Bean M.D.   On: 08/07/2014 20:35     EKG Interpretation   Date/Time:  Thursday August 07 2014 20:07:09 EST Ventricular Rate:  82 PR Interval:  170 QRS Duration: 113 QT Interval:  384 QTC Calculation: 448 R Axis:   77 Text Interpretation:  Sinus rhythm Ventricular premature complex  Borderline intraventricular conduction delay nonspecific t wave changes  lateral leads Confirmed by RAY MD, DANIELLE ([id number]) on 08/07/2014 8:16:16  PM      Filed Vitals:   08/07/14 2230 08/07/14 2245 08/07/14 2257 08/07/14 2257  BP: 150/90 128/91 128/91   Pulse: 77 75 75   Temp:    98.7 F (37.1 C)  TempSrc:    Oral  Resp: 33 21 26   SpO2: 97% 95% 100%      MDM   Meds given in ED:  Medications  ondansetron (ZOFRAN) injection 4 mg (not administered)  sodium chloride 0.9 % bolus 1,000 mL  (0 mLs Intravenous Stopped 08/07/14 2254)  piperacillin-tazobactam (ZOSYN) IVPB 3.375 g (0 g Intravenous Stopped 08/07/14 2105)  vancomycin (VANCOCIN) IVPB 1000 mg/200 mL premix (0 mg Intravenous Stopped 08/07/14 2254)  acetaminophen (TYLENOL) tablet 650 mg (650 mg Oral Given 08/07/14 2054)    New Prescriptions   No medications on file    Final diagnoses:  Cough   Stephen Ellis is a 69 y.o. male with a history of dementia, right paratracheal lung mass, and hypertension who presents to emergency department complaining of a cough for the past several months that is worse in the past 2 weeks. The patient is also reporting some shortness of breath.  Patient was febrile at 100.7 on arrival to the ED with an oxygen saturation 93% on  room air. Patient is not on oxygen at home. On oxygen the patient denies feeling short of breath. Patient's CBC showed a slight left shift. The patient's white count is 7.9. Patient had a creatinine 1.36 and GFR 53. 0.8. Patient has blood cultures pending. Patient's chest x-ray showed a stable right upper lobe mass and collapse. Patient given Zosyn and Vancomycin in the ED.  Patient has dementia and lives alone at home.  I'm suspicious for possible postobstructive pneumonia with cough.  Will admit this patient. Patient is accepted for admission by Dr. Chancy Milroy.   This patient was discussed with Dr. Jeanell Sparrow who agrees with assessment and plan.    Hanley Hays, PA-C 08/08/14 0130  Shaune Pollack, MD 08/09/14 [phone]

## 2014-08-07 NOTE — H&P (Signed)
[facility] History and Physical  Stephen Ellis ZOX:[id number] DOB: 05/23/1946 DOA: 08/07/2014  Referring physician: Mat Carne, PA PCP: Elizabeth Palau, MD   Chief Complaint: Cough weakness  HPI: Stephen Ellis is a 68 y.o. male presents with cough and weakness. Patient has a history of recent diagnosis of lung cancer involving the RUL. Patient had a bronchoscopy done on 11/24 which the surgical pathology was negative. By description of the report there was noted a mass in the airway. Patient now presents with cough and congestion has noted increased sputum production. Patient has no hemoptysis noted. Patient has a low grade fever reported. He has baseline shortness of breath. He also admits to having some pain in his chest associated with the cough. Patient states that he has not received any treatment yet for his lung problem. He does have dementia. Patient states that he lives in a boarding house.    Review of Systems:  Constitutional:  ++Fevers, no chills, ++fatigue.  HEENT:  No headaches, ear ache, nasal congestion, post nasal drip,  Cardio-vascular:  No chest pain, Orthopnea, PND, swelling in lower extremities GI:  No heartburn, indigestion, abdominal pain, nausea, vomiting  Resp:  ++shortness of breath with exertion. ++productive cough, No coughing up of blood. ++wheezing. Skin:  no rash or lesions.  GU:  no dysuria, change in color of urine.  Musculoskeletal:  No joint pain or swelling. No decreased range of motion.  Psych:  ++memory problems   Past Medical History  Diagnosis Date  . Syncope   . Dehydration   . Blind left eye   . Hypertension   . Anemia     "low Blood"  . Dementia     poor memory   Past Surgical History  Procedure Laterality Date  . No past surgeries    . Video bronchoscopy Bilateral 07/29/2014    Procedure: VIDEO BRONCHOSCOPY WITHOUT FLUORO;  Surgeon: Tanda Rockers, MD;  Location: [facility];  Service: Cardiopulmonary;   Laterality: Bilateral;   Social History:  reports that he quit smoking about 6 weeks ago. He has never used smokeless tobacco. He reports that he does not drink alcohol or use illicit drugs.  No Known Allergies  History reviewed. No pertinent family history.   Prior to Admission medications   Medication Sig Start Date End Date Taking? Authorizing Provider  PRESCRIPTION MEDICATION Take 1 tablet by mouth at bedtime.   Yes Historical Provider, MD   Physical Exam: Filed Vitals:   08/07/14 2230 08/07/14 2245 08/07/14 2257 08/07/14 2257  BP: 150/90 128/91 128/91   Pulse: 77 75 75   Temp:    98.7 F (37.1 C)  TempSrc:    Oral  Resp: 33 21 26   SpO2: 97% 95% 100%     Wt Readings from Last 3 Encounters:  07/29/14 79.379 kg (175 lb)  07/25/14 83.915 kg (185 lb)  07/16/14 77.111 kg (170 lb)    General:  Appears calm and comfortable Eyes: PERRL, normal lids, irises & conjunctiva ENT: grossly normal hearing, lips & tongue Neck: no LAD, masses or thyromegaly Cardiovascular: RRR, no m/r/g. No LE edema. Respiratory: few scattered ronchi. Normal respiratory effort. Abdomen: soft, ntnd Skin: no rash or induration seen on limited exam Musculoskeletal: grossly normal tone BUE/BLE Psychiatric: poor memory not agitated Neurologic: grossly non-focal.          Labs on Admission:  Basic Metabolic Panel:  Recent Labs Lab 08/07/14 2020  NA 134*  K 3.9  CL 98  CO2 23  GLUCOSE 100*  BUN 13  CREATININE 1.36*  CALCIUM 9.2   Liver Function Tests: No results for input(s): AST, ALT, ALKPHOS, BILITOT, PROT, ALBUMIN in the last 168 hours. No results for input(s): LIPASE, AMYLASE in the last 168 hours. No results for input(s): AMMONIA in the last 168 hours. CBC:  Recent Labs Lab 08/07/14 2020  WBC 7.9  NEUTROABS 6.6  HGB 13.3  HCT 38.5*  MCV 90.2  PLT 127*   Cardiac Enzymes:  Recent Labs Lab 08/07/14 2020  TROPONINI <0.30    BNP (last 3 results)  Recent Labs   09/15/13 1410  PROBNP 120.1   CBG: No results for input(s): GLUCAP in the last 168 hours.  Radiological Exams on Admission: Dg Chest 2 View  08/07/2014   CLINICAL DATA:  Productive cough for 1 week  EXAM: CHEST  2 VIEW  COMPARISON:  07/16/2014  FINDINGS: Right upper lobe mass with right upper lobe collapse is stable. Normal heart size. Lungs otherwise clear. No pneumothorax. No pleural effusion.  IMPRESSION: Stable right upper lobe mass and collapse.   Electronically Signed   By: Maryclare Bean M.D.   On: 08/07/2014 20:35     Assessment/Plan Principal Problem:   Postobstructive pneumonia Active Problems:   Hypertension   Dementia   Lung cancer, upper lobe   Pneumonia   1. Post obstructive pneumonia -recent diagnosis of RUL mass and had a negative bronchoscopy -now presenting with increased cough and sputum with post obstructive symptoms -will start on zosyn and vancocin -will check sputum cultures -will need oncology follow up  2. Hypertension -will monitor his pressures -no medications indicated -will have pharmacy review his medications  3. Dementia -patient is at baseline with his dementia  4. Probable Lung Cancer -as noted above RUL mass -will need oncology follow up    Code Status: Full Code (must indicate code status--if unknown or must be presumed, indicate so) DVT Prophylaxis:heparin Family Communication: none (indicate person spoken with, if applicable, with phone number if by telephone) Disposition Plan: SNF (indicate anticipated LOS)  Time spent: 34min  KHAN,SAADAT A [facility] Pager [phone]

## 2014-08-07 NOTE — ED Notes (Signed)
Pt remains monitored by blood pressure, pulse ox, and 12 lead.  

## 2014-08-07 NOTE — ED Notes (Addendum)
Pt remains monitored by blood pressure, pulse ox, and 12 lead.  

## 2014-08-07 NOTE — ED Notes (Signed)
Pt placed on monitor upon return to room from radiology. Pts EKG given to and signed by Dr. Jeanell Sparrow

## 2014-08-07 NOTE — ED Notes (Signed)
Per EMS - pt has had cough for months and has chest pain when coughing/with activity. Pain in upper right chest, 10/10 when chest is pushed on. Yellow sputum, no fever, VSS, 158/100, 90bpm, NSR w/ occasional PVC, no ST changes. No LOC/altered mental status.

## 2014-08-08 ENCOUNTER — Encounter (HOSPITAL_COMMUNITY): Payer: Self-pay | Admitting: *Deleted

## 2014-08-08 LAB — COMPREHENSIVE METABOLIC PANEL
ALBUMIN: 2.6 g/dL — AB (ref 3.5–5.2)
ALT: 12 U/L (ref 0–53)
AST: 17 U/L (ref 0–37)
Alkaline Phosphatase: 59 U/L (ref 39–117)
Anion gap: 11 (ref 5–15)
BILIRUBIN TOTAL: 0.5 mg/dL (ref 0.3–1.2)
BUN: 11 mg/dL (ref 6–23)
CALCIUM: 8.7 mg/dL (ref 8.4–10.5)
CHLORIDE: 101 meq/L (ref 96–112)
CO2: 25 mEq/L (ref 19–32)
Creatinine, Ser: 1.36 mg/dL — ABNORMAL HIGH (ref 0.50–1.35)
GFR calc Af Amer: 61 mL/min — ABNORMAL LOW (ref >90)
GFR calc non Af Amer: 53 mL/min — ABNORMAL LOW (ref >90)
Glucose, Bld: 90 mg/dL (ref 70–99)
Potassium: 3.8 mEq/L (ref 3.7–5.3)
Sodium: 137 mEq/L (ref 137–147)
Total Protein: 8.5 g/dL — ABNORMAL HIGH (ref 6.0–8.3)

## 2014-08-08 LAB — CBC
HCT: 34.1 % — ABNORMAL LOW (ref 39.0–52.0)
HEMOGLOBIN: 11.4 g/dL — AB (ref 13.0–17.0)
MCH: 29.7 pg (ref 26.0–34.0)
MCHC: 33.4 g/dL (ref 30.0–36.0)
MCV: 88.8 fL (ref 78.0–100.0)
Platelets: 129 10*3/uL — ABNORMAL LOW (ref 150–400)
RBC: 3.84 MIL/uL — AB (ref 4.22–5.81)
RDW: 13.1 % (ref 11.5–15.5)
WBC: 6.8 10*3/uL (ref 4.0–10.5)

## 2014-08-08 LAB — HEMOGLOBIN A1C
Hgb A1c MFr Bld: 6.1 % — ABNORMAL HIGH (ref <5.7)
Mean Plasma Glucose: 128 mg/dL — ABNORMAL HIGH (ref <117)

## 2014-08-08 LAB — TSH: TSH: 0.682 u[IU]/mL (ref 0.350–4.500)

## 2014-08-08 LAB — GLUCOSE, CAPILLARY: Glucose-Capillary: 88 mg/dL (ref 70–99)

## 2014-08-08 MED ORDER — DOCUSATE SODIUM 100 MG PO CAPS
100.0000 mg | ORAL_CAPSULE | Freq: Two times a day (BID) | ORAL | Status: DC
  Administered 2014-08-08 – 2014-08-09 (×3): 100 mg via ORAL
  Filled 2014-08-08 (×4): qty 1

## 2014-08-08 MED ORDER — BISACODYL 10 MG RE SUPP: 10.0000 mg | Freq: Every day | RECTAL | Status: DC | PRN

## 2014-08-08 MED ORDER — IPRATROPIUM-ALBUTEROL 0.5-2.5 (3) MG/3ML IN SOLN
3.0000 mL | Freq: Four times a day (QID) | RESPIRATORY_TRACT | Status: DC
  Administered 2014-08-08 – 2014-08-09 (×5): 3 mL via RESPIRATORY_TRACT
  Filled 2014-08-08: qty 3

## 2014-08-08 MED ORDER — FOLIC ACID 1 MG PO TABS
1.0000 mg | ORAL_TABLET | Freq: Every day | ORAL | Status: DC
  Administered 2014-08-08 – 2014-08-09 (×2): 1 mg via ORAL
  Filled 2014-08-08 (×2): qty 1

## 2014-08-08 MED ORDER — ALBUTEROL SULFATE (2.5 MG/3ML) 0.083% IN NEBU
2.5000 mg | INHALATION_SOLUTION | Freq: Four times a day (QID) | RESPIRATORY_TRACT | Status: DC
  Administered 2014-08-08: 2.5 mg via RESPIRATORY_TRACT

## 2014-08-08 MED ORDER — POLYETHYLENE GLYCOL 3350 17 G PO PACK
17.0000 g | PACK | Freq: Every day | ORAL | Status: DC | PRN
  Filled 2014-08-08: qty 1

## 2014-08-08 MED ORDER — VITAMIN B-1 100 MG PO TABS
100.0000 mg | ORAL_TABLET | Freq: Every day | ORAL | Status: DC
  Administered 2014-08-08 – 2014-08-09 (×2): 100 mg via ORAL
  Filled 2014-08-08 (×2): qty 1

## 2014-08-08 MED ORDER — ACETAMINOPHEN 325 MG PO TABS: 650.0000 mg | ORAL_TABLET | Freq: Four times a day (QID) | ORAL | Status: DC | PRN

## 2014-08-08 MED ORDER — CARVEDILOL 6.25 MG PO TABS
6.2500 mg | ORAL_TABLET | Freq: Two times a day (BID) | ORAL | Status: DC
  Administered 2014-08-08 – 2014-08-09 (×3): 6.25 mg via ORAL
  Filled 2014-08-08 (×6): qty 1

## 2014-08-08 MED ORDER — CETYLPYRIDINIUM CHLORIDE 0.05 % MT LIQD
7.0000 mL | Freq: Two times a day (BID) | OROMUCOSAL | Status: DC
  Administered 2014-08-08 – 2014-08-09 (×4): 7 mL via OROMUCOSAL

## 2014-08-08 MED ORDER — HEPARIN SODIUM (PORCINE) 5000 UNIT/ML IJ SOLN
5000.0000 [IU] | Freq: Three times a day (TID) | INTRAMUSCULAR | Status: DC
Start: 2014-08-08 — End: 2014-08-09
  Administered 2014-08-08 – 2014-08-09 (×5): 5000 [IU] via SUBCUTANEOUS
  Filled 2014-08-08 (×6): qty 1

## 2014-08-08 MED ORDER — ADULT MULTIVITAMIN W/MINERALS CH
1.0000 | ORAL_TABLET | Freq: Every day | ORAL | Status: DC
  Administered 2014-08-08 – 2014-08-09 (×2): 1 via ORAL
  Filled 2014-08-08 (×2): qty 1

## 2014-08-08 MED ORDER — ASPIRIN EC 81 MG PO TBEC
81.0000 mg | DELAYED_RELEASE_TABLET | Freq: Every day | ORAL | Status: DC
  Administered 2014-08-08 – 2014-08-09 (×2): 81 mg via ORAL
  Filled 2014-08-08 (×2): qty 1

## 2014-08-08 MED ORDER — OXYCODONE HCL 5 MG PO TABS: 5.0000 mg | ORAL_TABLET | ORAL | Status: DC | PRN

## 2014-08-08 MED ORDER — ACETAMINOPHEN 650 MG RE SUPP: 650.0000 mg | Freq: Four times a day (QID) | RECTAL | Status: DC | PRN

## 2014-08-08 MED ORDER — ONDANSETRON HCL 4 MG/2ML IJ SOLN: 4.0000 mg | Freq: Four times a day (QID) | INTRAMUSCULAR | Status: DC | PRN

## 2014-08-08 MED ORDER — SODIUM CHLORIDE 0.9 % IJ SOLN: 3.0000 mL | INTRAMUSCULAR | Status: DC | PRN

## 2014-08-08 MED ORDER — ONDANSETRON HCL 4 MG PO TABS: 4.0000 mg | ORAL_TABLET | Freq: Four times a day (QID) | ORAL | Status: DC | PRN

## 2014-08-08 MED ORDER — SODIUM CHLORIDE 0.9 % IV SOLN: 250.0000 mL | INTRAVENOUS | Status: DC | PRN

## 2014-08-08 MED ORDER — SODIUM CHLORIDE 0.9 % IJ SOLN
3.0000 mL | Freq: Two times a day (BID) | INTRAMUSCULAR | Status: DC
  Administered 2014-08-08 (×3): 3 mL via INTRAVENOUS

## 2014-08-08 MED ORDER — IPRATROPIUM BROMIDE 0.02 % IN SOLN
0.5000 mg | Freq: Four times a day (QID) | RESPIRATORY_TRACT | Status: DC
  Administered 2014-08-08: 0.5 mg via RESPIRATORY_TRACT

## 2014-08-08 MED ORDER — PNEUMOCOCCAL VAC POLYVALENT 25 MCG/0.5ML IJ INJ
0.5000 mL | INJECTION | INTRAMUSCULAR | Status: AC
  Administered 2014-08-09: 0.5 mL via INTRAMUSCULAR
  Filled 2014-08-08: qty 0.5

## 2014-08-08 NOTE — Plan of Care (Signed)
Problem: Phase I Progression Outcomes Goal: Dyspnea controlled at rest Outcome: Completed/Met Date Met:  08/08/14 Goal: Pain controlled with appropriate interventions Outcome: Completed/Met Date Met:  08/08/14

## 2014-08-08 NOTE — Consult Note (Addendum)
68 yo M with recent finding of RUL mass. Underwent FOB by Dr Melvyn Novas 11/24 with finding of total obstruction of RUL bronchus. EBBX specimens were nondiagnostic. He was admitted to [facility] service 12/03 with increased dyspnea and cough and treated presumptively for post obstructive PNA. PCCM is asked to address next step in diagnostic approach  Past Medical History  Diagnosis Date  . Syncope   . Dehydration   . Blind left eye   . Hypertension   . Anemia     "low Blood"  . Dementia     poor memory   History   Social History  . Marital Status: Single    Spouse Name: N/A    Number of Children: N/A  . Years of Education: N/A   Occupational History  . Not on file.   Social History Main Topics  . Smoking status: Former Smoker -- 1.00 packs/day for 38 years    Quit date: 06/24/2014  . Smokeless tobacco: Never Used  . Alcohol Use: No  . Drug Use: No  . Sexual Activity: Not on file   Other Topics Concern  . Not on file   Social History Narrative   History reviewed. No pertinent family history.  Current medications have been reviewed  Physical Exam  Constitutional: He is oriented to person, place, and time. He appears well-developed and well-nourished. No distress.  HENT:  Head: Normocephalic and atraumatic.  Eyes: EOM are normal. Pupils are equal, round, and reactive to light.  Neck: Normal range of motion. Neck supple. No JVD present.  Cardiovascular: Normal rate, regular rhythm and normal heart sounds.   No murmur heard. Pulmonary/Chest: Effort normal. He has wheezes.  Abdominal: Soft. Bowel sounds are normal.  Musculoskeletal: Normal range of motion. He exhibits no edema.  Lymphadenopathy:    He has no cervical adenopathy.  Neurological: He is alert and oriented to person, place, and time. He has normal reflexes. No cranial nerve deficit.  Skin: Skin is warm and dry.  Psychiatric: He has a normal mood and affect.    I have reviewed all of today's lab results. Relevant  abnormalities are discussed in the A/P section  Unfortunately, PACS system is currently unavailable to me. I have reviewd all relevant Xray reports  IMPRESSION: RUL lung mass Nondiagnostic bronchoscopy 11/24 Cough and dyspnea - concern for post-obstructive PNA Wheezing  DISCUSSION: I have spoken with Dr Melvyn Novas who performed the original bronchoscopy. He indicates that repeat bronchoscopy is reasonable option. This cannot be scheduled today and the pt does not need to remain in the hosp to get this done  PLAN/REC: OK for discharge to home today from PCCM perspective Repeat FOB as outpt scheduled for Tues 12/08 PET scan already scheduled for 12/10 Will ensure that proper outpt followup is scheduled with Dr Melvyn Novas after the above studies have been completed Would treat empirically with Augmentin for 10 days Scheduled bronchodilator med - suggest Symbicort or equivalent Cough suppressant PRN I have discussed the above plan with pt's nephew Berna Spare will function as the primary contact person for this pt   Merton Border, MD ; [facility] service Mobile [phone].  After 5:30 PM or weekends, call [phone]

## 2014-08-08 NOTE — Plan of Care (Signed)
Problem: Phase I Progression Outcomes Goal: OOB as tolerated unless otherwise ordered Outcome: Completed/Met Date Met:  08/08/14 Goal: Voiding-avoid urinary catheter unless indicated Outcome: Completed/Met Date Met:  08/08/14 Goal: Hemodynamically stable Outcome: Completed/Met Date Met:  08/08/14  Problem: Phase II Progression Outcomes Goal: Pain controlled Outcome: Completed/Met Date Met:  08/08/14 Goal: Tolerating diet Outcome: Completed/Met Date Met:  08/08/14  Problem: Phase III Progression Outcomes Goal: Activity at appropriate level-compared to baseline (UP IN CHAIR FOR HEMODIALYSIS)  Outcome: Completed/Met Date Met:  08/08/14 Goal: Tolerating diet Outcome: Completed/Met Date Met:  08/08/14

## 2014-08-08 NOTE — Progress Notes (Signed)
Patient Demographics  Stephen Ellis, is a 68 y.o. male, DOB - 1945/11/12, QJJ:[id number]  Admit date - 08/07/2014   Admitting Physician Allyne Gee, MD  Outpatient Primary MD for the patient is Elizabeth Palau, MD  LOS - 1   Chief Complaint  Patient presents with  . Cough        Subjective:   Stephen Ellis today has, No headache, No chest pain, No abdominal pain - No Nausea, No new weakness tingling or numbness, does have mild Cough and mild exertional SOB.   Assessment & Plan    1. Cough with right upper lobe mass and collapse. Being admitted for post obstructive pneumonia HCAP. Afebrile, no white count, we will follow cultures and taper antibiotics rapidly as suspicion for a true infection is low, he recently had a bronchoscopy but biopsy results were inconclusive. Continue supportive care. Have requested pulmonary to reevaluate and guide [place] with further testing and intervention.   2. Hypertension. Blood pressure on the elevated side, we'll add Coreg for better control.    3. Underlying dementia. Stable. Supportive care. At risk for delirium.      Code Status: Full  Family Communication: None present  Disposition Plan: To be decided   Procedures    Consults  PCCM   Medications  Scheduled Meds: . antiseptic oral rinse  7 mL Mouth Rinse BID  . aspirin EC  81 mg Oral Daily  . docusate sodium  100 mg Oral BID  . folic acid  1 mg Oral Daily  . heparin  5,000 Units Subcutaneous 3 times per day  . ipratropium-albuterol  3 mL Nebulization Q6H  . multivitamin with minerals  1 tablet Oral Daily  . [START ON 08/09/2014] pneumococcal 23 valent vaccine  0.5 mL Intramuscular Tomorrow-1000  . sodium chloride  3 mL Intravenous Q12H  . thiamine  100 mg Oral Daily   Continuous  Infusions:  PRN Meds:.sodium chloride, acetaminophen **OR** acetaminophen, bisacodyl, ondansetron **OR** ondansetron (ZOFRAN) IV, oxyCODONE, polyethylene glycol, sodium chloride  DVT Prophylaxis    Heparin   Lab Results  Component Value Date   PLT 129* 08/08/2014    Antibiotics    Anti-infectives    Start     Dose/Rate Route Frequency Ordered Stop   08/07/14 2030  piperacillin-tazobactam (ZOSYN) IVPB 3.375 g     3.375 g100 mL/hr over 30 Minutes Intravenous  Once 08/07/14 2018 08/07/14 2105   08/07/14 2030  vancomycin (VANCOCIN) IVPB 1000 mg/200 mL premix     1,000 mg200 mL/hr over 60 Minutes Intravenous  Once 08/07/14 2018 08/07/14 2254          Objective:   Filed Vitals:   08/07/14 2330 08/08/14 0018 08/08/14 0141 08/08/14 0414  BP: 147/97 168/96  142/97  Pulse: 64 74  82  Temp:  98.8 F (37.1 C)  99.3 F (37.4 C)  TempSrc:  Oral  Oral  Resp: 33 22  20  Height:  6' (1.829 m)    Weight:  80.9 kg (178 lb 5.6 oz)    SpO2: 98% 100% 100% 100%    Wt Readings from Last 3 Encounters:  08/08/14 80.9 kg (178 lb 5.6 oz)  07/29/14 79.379 kg (175 lb)  07/25/14 83.915 kg (185 lb)  Intake/Output Summary (Last 24 hours) at 08/08/14 0828 Last data filed at 08/08/14 0800  Gross per 24 hour  Intake   1440 ml  Output    300 ml  Net   1140 ml     Physical Exam  Awake Alert, No new F.N deficits, Normal affect [place].AT,PERRAL Supple Neck,No JVD, No cervical lymphadenopathy appriciated.  Symmetrical Chest wall movement, reduced breath sounds in the right upper lobe RRR,No Gallops,Rubs or new Murmurs, No Parasternal Heave +ve B.Sounds, Abd Soft, No tenderness, No organomegaly appriciated, No rebound - guarding or rigidity. No Cyanosis, Clubbing or edema, No new Rash or bruise     Data Review   Micro Results No results found for this or any previous visit (from the past 240 hour(s)).  Radiology Reports Dg Chest 2 View  08/07/2014   CLINICAL DATA:  Productive cough for  1 week  EXAM: CHEST  2 VIEW  COMPARISON:  07/16/2014  FINDINGS: Right upper lobe mass with right upper lobe collapse is stable. Normal heart size. Lungs otherwise clear. No pneumothorax. No pleural effusion.  IMPRESSION: Stable right upper lobe mass and collapse.   Electronically Signed   By: Maryclare Bean M.D.   On: 08/07/2014 20:35     CBC  Recent Labs Lab 08/07/14 2020 08/08/14 0435  WBC 7.9 6.8  HGB 13.3 11.4*  HCT 38.5* 34.1*  PLT 127* 129*  MCV 90.2 88.8  MCH 31.1 29.7  MCHC 34.5 33.4  RDW 12.9 13.1  LYMPHSABS 0.7  --   MONOABS 0.5  --   EOSABS 0.0  --   BASOSABS 0.0  --     Chemistries   Recent Labs Lab 08/07/14 2020 08/08/14 0435  NA 134* 137  K 3.9 3.8  CL 98 101  CO2 23 25  GLUCOSE 100* 90  BUN 13 11  CREATININE 1.36* 1.36*  CALCIUM 9.2 8.7  AST  --  17  ALT  --  12  ALKPHOS  --  59  BILITOT  --  0.5   ------------------------------------------------------------------------------------------------------------------ estimated creatinine clearance is 58.6 mL/min (by C-G formula based on Cr of 1.36). ------------------------------------------------------------------------------------------------------------------ No results for input(s): HGBA1C in the last 72 hours. ------------------------------------------------------------------------------------------------------------------ No results for input(s): CHOL, HDL, LDLCALC, TRIG, CHOLHDL, LDLDIRECT in the last 72 hours. ------------------------------------------------------------------------------------------------------------------  Recent Labs  08/08/14 0435  TSH 0.682   ------------------------------------------------------------------------------------------------------------------ No results for input(s): VITAMINB12, FOLATE, FERRITIN, TIBC, IRON, RETICCTPCT in the last 72 hours.  Coagulation profile No results for input(s): INR, PROTIME in the last 168 hours.  No results for input(s): DDIMER in the  last 72 hours.  Cardiac Enzymes  Recent Labs Lab 08/07/14 2020  TROPONINI <0.30   ------------------------------------------------------------------------------------------------------------------ Invalid input(s): POCBNP     Time Spent in minutes   35   Stephen Ellis K M.D on 08/08/2014 at 8:28 AM  Between 7am to 7pm - Pager - [phone]  After 7pm go to [email or web address] - password [email or web address]  And look for the night coverage person covering for me after hours  [facility] Office  [phone]
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
## 2014-08-09 DIAGNOSIS — J189 Pneumonia, unspecified organism: Secondary | ICD-10-CM | POA: Diagnosis not present

## 2014-08-09 LAB — GLUCOSE, CAPILLARY: Glucose-Capillary: 97 mg/dL (ref 70–99)

## 2014-08-09 MED ORDER — AMOXICILLIN-POT CLAVULANATE 875-125 MG PO TABS: 1.0000 | ORAL_TABLET | Freq: Two times a day (BID) | ORAL | Status: DC

## 2014-08-09 MED ORDER — IPRATROPIUM-ALBUTEROL 0.5-2.5 (3) MG/3ML IN SOLN: 3.0000 mL | Freq: Four times a day (QID) | RESPIRATORY_TRACT | Status: DC

## 2014-08-09 MED ORDER — BUDESONIDE-FORMOTEROL FUMARATE 160-4.5 MCG/ACT IN AERO: 2.0000 | INHALATION_SPRAY | Freq: Two times a day (BID) | RESPIRATORY_TRACT | Status: DC

## 2014-08-09 NOTE — Progress Notes (Signed)
Pt. Discharged to boarding home with nephew. Discharge instructions and prescriptions given to nephew who verbalized signs and symptoms of worsening condition and when to call the doctor. Pt hemodynamically stable with no signs of respiratory distress.  Penni Bombard, RN 08/09/2014 7:31 PM

## 2014-08-09 NOTE — Discharge Instructions (Signed)
Follow with Primary MD Elizabeth Palau, MD in 7 days , follow on final blood culture results next visit.  Get CBC, CMP, 2 view Chest X ray checked  by Primary MD next visit.    Activity: As tolerated with Full fall precautions use walker/cane & assistance as needed   Disposition Home / shelter   Diet: Heart Healthy    For Heart failure patients - Check your Weight same time everyday, if you gain over 2 pounds, or you develop in leg swelling, experience more shortness of breath or chest pain, call your Primary MD immediately. Follow Cardiac Low Salt Diet and 1.8 lit/day fluid restriction.   On your next visit with your primary care physician please Get Medicines reviewed and adjusted.   Please request your Prim.MD to go over all Hospital Tests and Procedure/Radiological results at the follow up, please get all Hospital records sent to your Prim MD by signing hospital release before you go home.   If you experience worsening of your admission symptoms, develop shortness of breath, life threatening emergency, suicidal or homicidal thoughts you must seek medical attention immediately by calling 911 or calling your MD immediately  if symptoms less severe.  You Must read complete instructions/literature along with all the possible adverse reactions/side effects for all the Medicines you take and that have been prescribed to you. Take any new Medicines after you have completely understood and accpet all the possible adverse reactions/side effects.   Do not drive, operating heavy machinery, perform activities at heights, swimming or participation in water activities or provide baby sitting services if your were admitted for syncope or siezures until you have seen by Primary MD or a Neurologist and advised to do so again.  Do not drive when taking Pain medications.    Do not take more than prescribed Pain, Sleep and Anxiety Medications  Special Instructions: If you have smoked or chewed  Tobacco  in the last 2 yrs please stop smoking, stop any regular Alcohol  and or any Recreational drug use.  Wear Seat belts while driving.   Please note  You were cared for by a hospitalist during your hospital stay. If you have any questions about your discharge medications or the care you received while you were in the hospital after you are discharged, you can call the unit and asked to speak with the hospitalist on call if the hospitalist that took care of you is not available. Once you are discharged, your primary care physician will handle any further medical issues. Please note that NO REFILLS for any discharge medications will be authorized once you are discharged, as it is imperative that you return to your primary care physician (or establish a relationship with a primary care physician if you do not have one) for your aftercare needs so that they can reassess your need for medications and monitor your lab values.

## 2014-08-09 NOTE — Discharge Summary (Signed)
Stephen Ellis, is a 68 y.o. male  DOB [birth year]  MRN [id number].  Admission date:  08/07/2014  Admitting Physician  Allyne Gee, MD  Discharge Date:  08/09/2014   Primary MD  Elizabeth Palau, MD  Recommendations for primary care physician for things to follow:   Please ensure patient follows with pulmonary on a prompt basis. Kindly check final blood culture results drawn this admission they are negative so far.   Admission Diagnosis  Cough [R05]   Discharge Diagnosis  Cough [R05]    Principal Problem:   Postobstructive pneumonia Active Problems:   Hypertension   Dementia   Lung cancer, upper lobe   Pneumonia      Past Medical History  Diagnosis Date  . Syncope   . Dehydration   . Blind left eye   . Hypertension   . Anemia     "low Blood"  . Dementia     poor memory    Past Surgical History  Procedure Laterality Date  . No past surgeries    . Video bronchoscopy Bilateral 07/29/2014    Procedure: VIDEO BRONCHOSCOPY WITHOUT FLUORO;  Surgeon: Tanda Rockers, MD;  Location: [facility];  Service: Cardiopulmonary;  Laterality: Bilateral;       History of present illness and  Hospital Course:     Kindly see H&P for history of present illness and admission details, please review complete Labs, Consult reports and Test reports for all details in brief  HPI  from the history and physical done on the day of admission   Stephen Ellis is a 68 y.o. male presents with cough and weakness. Patient has a history of recent diagnosis of lung cancer involving the RUL. Patient had a bronchoscopy done on 11/24 which the surgical pathology was negative. By description of the report there was noted a mass in the airway. Patient now presents with cough and congestion has noted increased sputum production. Patient has no  hemoptysis noted. Patient has a low grade fever reported. He has baseline shortness of breath. He also admits to having some pain in his chest associated with the cough. Patient states that he has not received any treatment yet for his lung problem. He does have dementia. Patient states that he lives in a boarding house.    Hospital Course    1. Cough with right upper lobe mass and collapse. Being admitted for post obstructive pneumonia HCAP. Afebrile, no white count, so far cultures have been negative, discussed with pulmonary critical care Dr. Alva Garnet, okay to switch to oral Augmentin for 5-6 more days taper and discharge, he recently had a bronchoscopy but biopsy results were inconclusive. Per pulmonary critical care patient is scheduled for outpatient PET scan and thereafter will follow with his primary pulmonologist Dr. Melvyn Novas for further workup which may include a repeat bronchoscopy.    2. Hypertension. And tinea Norvasc.     3. Underlying dementia. Stable. Supportive care. At risk for delirium. All her with PCP post discharge.  Discharge Condition: Stable   Follow UP  Follow-up Information    Follow up with Elizabeth Palau, MD. Schedule an appointment as soon as possible for a visit in 3 days.   Specialty:  Family Medicine   Contact information:   [address] [place] [address] [phone]       Follow up with Christinia Gully, MD. Schedule an appointment as soon as possible for a visit in 1 week.   Specialty:  Pulmonary Disease   Contact information:   [address] [place] [address] [phone]         Discharge Instructions  and  Discharge Medications      Discharge Instructions    Diet - low sodium heart healthy    Complete by:  As directed      Discharge instructions    Complete by:  As directed   Follow with Primary MD Elizabeth Palau, MD in 7 days , follow on final blood culture results next visit.  Get CBC,  CMP, 2 view Chest X ray checked  by Primary MD next visit.    Activity: As tolerated with Full fall precautions use walker/cane & assistance as needed   Disposition Home / shelter   Diet: Heart Healthy    For Heart failure patients - Check your Weight same time everyday, if you gain over 2 pounds, or you develop in leg swelling, experience more shortness of breath or chest pain, call your Primary MD immediately. Follow Cardiac Low Salt Diet and 1.8 lit/day fluid restriction.   On your next visit with your primary care physician please Get Medicines reviewed and adjusted.   Please request your Prim.MD to go over all Hospital Tests and Procedure/Radiological results at the follow up, please get all Hospital records sent to your Prim MD by signing hospital release before you go home.   If you experience worsening of your admission symptoms, develop shortness of breath, life threatening emergency, suicidal or homicidal thoughts you must seek medical attention immediately by calling 911 or calling your MD immediately  if symptoms less severe.  You Must read complete instructions/literature along with all the possible adverse reactions/side effects for all the Medicines you take and that have been prescribed to you. Take any new Medicines after you have completely understood and accpet all the possible adverse reactions/side effects.   Do not drive, operating heavy machinery, perform activities at heights, swimming or participation in water activities or provide baby sitting services if your were admitted for syncope or siezures until you have seen by Primary MD or a Neurologist and advised to do so again.  Do not drive when taking Pain medications.    Do not take more than prescribed Pain, Sleep and Anxiety Medications  Special Instructions: If you have smoked or chewed Tobacco  in the last 2 yrs please stop smoking, stop any regular Alcohol  and or any Recreational drug use.  Wear Seat  belts while driving.   Please note  You were cared for by a hospitalist during your hospital stay. If you have any questions about your discharge medications or the care you received while you were in the hospital after you are discharged, you can call the unit and asked to speak with the hospitalist on call if the hospitalist that took care of you is not available. Once you are discharged, your primary care physician will handle any further medical issues. Please note that NO REFILLS for any discharge medications  will be authorized once you are discharged, as it is imperative that you return to your primary care physician (or establish a relationship with a primary care physician if you do not have one) for your aftercare needs so that they can reassess your need for medications and monitor your lab values.     Increase activity slowly    Complete by:  As directed             Medication List    TAKE these medications        amLODipine 10 MG tablet  Commonly known as:  NORVASC  Take 10 mg by mouth daily.     amoxicillin-clavulanate 875-125 MG per tablet  Commonly known as:  AUGMENTIN  Take 1 tablet by mouth 2 (two) times daily.     budesonide-formoterol 160-4.5 MCG/ACT inhaler  Commonly known as:  SYMBICORT  Inhale 2 puffs into the lungs 2 (two) times daily.     ipratropium-albuterol 0.5-2.5 (3) MG/3ML Soln  Commonly known as:  DUONEB  Take 3 mLs by nebulization every 6 (six) hours.          Diet and Activity recommendation: See Discharge Instructions above   Consults obtained - PCCM   Major procedures and Radiology Reports - PLEASE review detailed and final reports for all details, in brief -       Dg Chest 2 View  08/07/2014   CLINICAL DATA:  Productive cough for 1 week  EXAM: CHEST  2 VIEW  COMPARISON:  07/16/2014  FINDINGS: Right upper lobe mass with right upper lobe collapse is stable. Normal heart size. Lungs otherwise clear. No pneumothorax. No pleural  effusion.  IMPRESSION: Stable right upper lobe mass and collapse.   Electronically Signed   By: Maryclare Bean M.D.   On: 08/07/2014 20:35     Micro Results     No results found for this or any previous visit (from the past 240 hour(s)).     Today   Subjective:   Stephen Ellis today has no headache,no chest abdominal pain,no new weakness tingling or numbness, feels much better wants to go home today.   Objective:   Blood pressure 128/99, pulse 80, temperature 99.1 F (37.3 C), temperature source Oral, resp. rate 16, height 6' (1.829 m), weight 80.9 kg (178 lb 5.6 oz), SpO2 98 %.   Intake/Output Summary (Last 24 hours) at 08/09/14 0833 Last data filed at 08/09/14 0800  Gross per 24 hour  Intake    240 ml  Output    200 ml  Net     40 ml    Exam Awake Alert, Oriented x 3, No new F.N deficits, Normal affect [place].AT,PERRAL Supple Neck,No JVD, No cervical lymphadenopathy appriciated.  Symmetrical Chest wall movement, Good air movement bilaterally, CTAB RRR,No Gallops,Rubs or new Murmurs, No Parasternal Heave +ve B.Sounds, Abd Soft, Non tender, No organomegaly appriciated, No rebound -guarding or rigidity. No Cyanosis, Clubbing or edema, No new Rash or bruise  Data Review   CBC w Diff: Lab Results  Component Value Date   WBC 6.8 08/08/2014   WBC 2.3* 07/20/2012   HGB 11.4* 08/08/2014   HGB 13.2 07/20/2012   HCT 34.1* 08/08/2014   HCT 37.4* 07/20/2012   PLT 129* 08/08/2014   PLT 111* 07/20/2012   LYMPHOPCT 9* 08/07/2014   LYMPHOPCT 31.1 07/20/2012   MONOPCT 7 08/07/2014   MONOPCT 16.6* 07/20/2012   EOSPCT 0 08/07/2014   EOSPCT 0.4 07/20/2012   BASOPCT 0 08/07/2014  BASOPCT 0.6 07/20/2012    CMP: Lab Results  Component Value Date   NA 137 08/08/2014   NA 137 07/20/2012   K 3.8 08/08/2014   K 4.2 07/20/2012   CL 101 08/08/2014   CL 105 07/20/2012   CO2 25 08/08/2014   CO2 28 07/20/2012   BUN 11 08/08/2014   BUN 16.0 07/20/2012   CREATININE 1.36* 08/08/2014     CREATININE 1.5* 07/20/2012   PROT 8.5* 08/08/2014   PROT 8.5* 07/20/2012   ALBUMIN 2.6* 08/08/2014   ALBUMIN 3.2* 07/20/2012   BILITOT 0.5 08/08/2014   BILITOT 0.44 07/20/2012   ALKPHOS 59 08/08/2014   ALKPHOS 60 07/20/2012   AST 17 08/08/2014   AST 13 07/20/2012   ALT 12 08/08/2014   ALT 16 07/20/2012  .   Total Time in preparing paper work, data evaluation and todays exam - 35 minutes  Thurnell Lose M.D on 08/09/2014 at 8:33 AM  [facility] Office  [phone]

## 2014-08-09 NOTE — Plan of Care (Signed)
Problem: Phase I Progression Outcomes Goal: First antibiotic given within 6hrs of admit Outcome: Completed/Met Date Met:  08/09/14 Goal: Consider pulmonary consult Outcome: Completed/Met Date Met:  08/09/14 Goal: Initial discharge plan identified Outcome: Completed/Met Date Met:  08/09/14

## 2014-08-09 NOTE — Care Management Note (Signed)
    Page 1 of 1   08/09/2014     10:40:23 AM CARE MANAGEMENT NOTE 08/09/2014  Patient:  Stephen Ellis, Stephen Ellis   Account Number:  [id number]  Date Initiated:  08/09/2014  Documentation initiated by:    Subjective/Objective Assessment:   ADM Cough weakness     Action/Plan:   dISCHARGE PLANNING   Anticipated DC Date:  08/09/2014   Anticipated DC Plan:  [place]  CM consult      Choice offered to / List presented to:  C-1 Patient   DME arranged  NEBULIZER MACHINE      DME agency  [facility].        Status of service:  Completed, signed off Medicare Important Message given?   (If response is "NO", the following Medicare IM given date fields will be blank) Date Medicare IM given:   Medicare IM given by:   Date Additional Medicare IM given:   Additional Medicare IM given by:    Discharge Disposition:  HOME/SELF CARE  Per UR Regulation:    If discussed at Long Length of Stay Meetings, dates discussed:    Comments:  08/09/14 10:00 CM met with pt to discuss discharge needs. Pt states he feels comfortable using the neb machine and states no other needs.  CM called [facility] DME rep, Jeneen Rinks to please deliver the neb machine to the room prior to dishcharge.  No other CM needs were communicated.  Mariane Masters, BSN, CM [phone].

## 2014-08-10 NOTE — Progress Notes (Signed)
UR Completed.  Stephen Ellis [phone] 08/10/2014

## 2014-08-12 ENCOUNTER — Ambulatory Visit (HOSPITAL_COMMUNITY)
Admit: 2014-08-12 | Discharge: 2014-08-12 | Disposition: A | Discharge status: Home / Self Care | Payer: Medicare Other | Attending: Pulmonary Disease | Admitting: Pulmonary Disease

## 2014-08-12 ENCOUNTER — Ambulatory Visit (HOSPITAL_COMMUNITY)
Admission: RE | Admit: 2014-08-12 | Discharge: 2014-08-12 | Disposition: A | Discharge status: Home / Self Care | Payer: Medicare Other | Source: Ambulatory Visit | Attending: Pulmonary Disease | Admitting: Pulmonary Disease

## 2014-08-12 ENCOUNTER — Encounter (HOSPITAL_COMMUNITY)
Admission: RE | Disposition: A | Discharge status: Home / Self Care | Payer: Self-pay | Source: Ambulatory Visit | Attending: Pulmonary Disease

## 2014-08-12 DIAGNOSIS — C3490 Malignant neoplasm of unspecified part of unspecified bronchus or lung: Secondary | ICD-10-CM

## 2014-08-12 DIAGNOSIS — R918 Other nonspecific abnormal finding of lung field: Secondary | ICD-10-CM | POA: Insufficient documentation

## 2014-08-12 HISTORY — PX: VIDEO BRONCHOSCOPY: SHX5072

## 2014-08-12 HISTORY — DX: Malignant neoplasm of unspecified part of unspecified bronchus or lung: C34.90

## 2014-08-12 SURGERY — VIDEO BRONCHOSCOPY WITHOUT FLUORO
Anesthesia: Moderate Sedation | Laterality: Bilateral

## 2014-08-12 MED ORDER — MIDAZOLAM HCL 10 MG/2ML IJ SOLN
INTRAMUSCULAR | Status: DC | PRN
  Administered 2014-08-12: 4 mg via INTRAVENOUS

## 2014-08-12 MED ORDER — FENTANYL CITRATE 0.05 MG/ML IJ SOLN
INTRAMUSCULAR | Status: DC | PRN
  Administered 2014-08-12: 50 ug via INTRAVENOUS

## 2014-08-12 MED ORDER — PHENYLEPHRINE HCL 0.25 % NA SOLN: 1.0000 | Freq: Four times a day (QID) | NASAL | Status: DC | PRN

## 2014-08-12 MED ORDER — LIDOCAINE HCL (PF) 1 % IJ SOLN
INTRAMUSCULAR | Status: DC | PRN
  Administered 2014-08-12: 6 mL

## 2014-08-12 MED ORDER — LIDOCAINE HCL 2 % EX GEL: 1.0000 "application " | Freq: Once | CUTANEOUS | Status: DC

## 2014-08-12 MED ORDER — LIDOCAINE HCL 2 % EX GEL
CUTANEOUS | Status: DC | PRN
  Administered 2014-08-12: 1

## 2014-08-12 MED ORDER — BUTAMBEN-TETRACAINE-BENZOCAINE 2-2-14 % EX AERO: 1.0000 | INHALATION_SPRAY | Freq: Once | CUTANEOUS | Status: DC

## 2014-08-12 MED ORDER — PHENYLEPHRINE HCL 0.25 % NA SOLN
NASAL | Status: DC | PRN
  Administered 2014-08-12: 2 via NASAL

## 2014-08-12 MED ORDER — FENTANYL CITRATE 0.05 MG/ML IJ SOLN
INTRAMUSCULAR | Status: AC
  Filled 2014-08-12: qty 4

## 2014-08-12 MED ORDER — MIDAZOLAM HCL 5 MG/ML IJ SOLN
INTRAMUSCULAR | Status: AC
  Filled 2014-08-12: qty 2

## 2014-08-12 MED ORDER — SODIUM CHLORIDE 0.9 % IV SOLN
INTRAVENOUS | Status: DC
  Administered 2014-08-12: 13:00:00 via INTRAVENOUS

## 2014-08-12 NOTE — Op Note (Signed)
Indication:  RUL mass   Premedication:  Midaz 4 mg Fentanyl 50 mcg   Anesthesia: Topical anesthesia via nebulization and nasal lubricant 40 cc 1% lidocaine during procedure  Procedure: After adequate sedation and anesthesia, the bronchoscope was introduced via the R naris and advanced into the posterior pharynx. Further anesthesia was obtained with 1% lidocaine and the scope was advanced into the trachea. Complete airway anesthesia was achieved with 1% lidocaine and a thorough airway examination was performed. This revealed the following.  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

## 2014-08-12 NOTE — Progress Notes (Signed)
Video bronchoscopy performed Intervention bronchial washings Intervention bronchial brushings Intervention bronchial biopsy Intervention needle biopsy Pt tolerated well  Kathie Dike RRT  Merton Border, MD ; [facility] service Mobile [phone].  After 5:30 PM or weekends, call [phone]

## 2014-08-12 NOTE — Discharge Instructions (Signed)
Flexible Bronchoscopy, Care After These instructions give you information on caring for yourself after your procedure. Your doctor may also give you more specific instructions. Call your doctor if you have any problems or questions after your procedure. HOME CARE  Do not eat or drink anything for 2 hours after your procedure. If you try to eat or drink before the medicine wears off, food or drink could go into your lungs. You could also burn yourself.  After 2 hours have passed and when you can cough and gag normally, you may eat soft food and drink liquids slowly.  The day after the test, you may eat your normal diet.  You may do your normal activities.  Keep all doctor visits. GET HELP RIGHT AWAY IF:  You get more and more short of breath.  You get light-headed.  You feel like you are going to pass out (faint).  You have chest pain.  You have new problems that worry you.  You cough up more than a little blood.  You cough up more blood than before. MAKE SURE YOU:  Understand these instructions.  Will watch your condition.  Will get help right away if you are not doing well or get worse. Document Released: 06/19/2009 Document Revised: 08/27/2013 Document Reviewed: 04/26/2013 [facility] Patient Information 2015 [place], [place]. This information is not intended to replace advice given to you by your health care provider. Make sure you discuss any questions you have with your health care provider.   Do not eat or drink until after 1540 today 08/12/2014

## 2014-08-13 ENCOUNTER — Encounter (HOSPITAL_COMMUNITY): Payer: Self-pay | Admitting: Pulmonary Disease

## 2014-08-13 NOTE — H&P (Signed)
Pt seen in consultation last week while hospitalized. Full consult note reviewed. Here for repeat bronchoscopy to eval RUL mass He has no new complaints  Exam unchanged from previously  PLAN: FOB to eval lung mass  Merton Border, MD ; [facility] service Mobile [phone].  After 5:30 PM or weekends, call [phone]

## 2014-08-14 ENCOUNTER — Ambulatory Visit (HOSPITAL_COMMUNITY)
Admission: RE | Admit: 2014-08-14 | Discharge: 2014-08-14 | Disposition: A | Discharge status: Home / Self Care | Payer: Medicare Other | Source: Ambulatory Visit | Attending: Internal Medicine | Admitting: Internal Medicine

## 2014-08-14 DIAGNOSIS — J9811 Atelectasis: Secondary | ICD-10-CM | POA: Diagnosis not present

## 2014-08-14 DIAGNOSIS — J984 Other disorders of lung: Secondary | ICD-10-CM | POA: Insufficient documentation

## 2014-08-14 DIAGNOSIS — R591 Generalized enlarged lymph nodes: Secondary | ICD-10-CM | POA: Insufficient documentation

## 2014-08-14 DIAGNOSIS — E0789 Other specified disorders of thyroid: Secondary | ICD-10-CM | POA: Insufficient documentation

## 2014-08-14 DIAGNOSIS — R918 Other nonspecific abnormal finding of lung field: Secondary | ICD-10-CM | POA: Diagnosis present

## 2014-08-14 LAB — GLUCOSE, CAPILLARY: GLUCOSE-CAPILLARY: 104 mg/dL — AB (ref 70–99)

## 2014-08-14 LAB — CULTURE, BLOOD (ROUTINE X 2)
CULTURE: NO GROWTH
Culture: NO GROWTH

## 2014-08-14 MED ORDER — FLUDEOXYGLUCOSE F - 18 (FDG) INJECTION
9.8000 | Freq: Once | INTRAVENOUS | Status: AC | PRN
  Administered 2014-08-14: 9.8 via INTRAVENOUS

## 2014-08-15 ENCOUNTER — Other Ambulatory Visit: Payer: Self-pay | Admitting: Internal Medicine

## 2014-08-15 DIAGNOSIS — C3410 Malignant neoplasm of upper lobe, unspecified bronchus or lung: Secondary | ICD-10-CM

## 2014-08-15 NOTE — Progress Notes (Signed)
Quick Note:  Order was sent to [facility] ______

## 2014-08-18 ENCOUNTER — Encounter: Payer: Self-pay | Admitting: *Deleted

## 2014-08-18 ENCOUNTER — Other Ambulatory Visit: Payer: Self-pay | Admitting: *Deleted

## 2014-08-18 ENCOUNTER — Telehealth: Payer: Self-pay | Admitting: *Deleted

## 2014-08-18 DIAGNOSIS — C3410 Malignant neoplasm of upper lobe, unspecified bronchus or lung: Secondary | ICD-10-CM

## 2014-08-18 NOTE — [facility] Oncology Navigator Note (Unsigned)
Mr. Stephen Ellis called back.  I gave him the appt with Dr. Julien Nordmann on 08/25/14 at 11:00.  He verbalized understanding of appt time and place.

## 2014-08-18 NOTE — Telephone Encounter (Signed)
Called Stephen Ellis.  I was unable to understand him and he asked that I call Stephen Ellis to make appt.  I did and left vm message with appt time and place.  08/25/14 at 11:00

## 2014-08-25 ENCOUNTER — Ambulatory Visit (HOSPITAL_BASED_OUTPATIENT_CLINIC_OR_DEPARTMENT_OTHER): Payer: Medicare Other | Admitting: Lab

## 2014-08-25 ENCOUNTER — Telehealth: Payer: Self-pay | Admitting: *Deleted

## 2014-08-25 ENCOUNTER — Telehealth: Payer: Self-pay | Admitting: Internal Medicine

## 2014-08-25 ENCOUNTER — Encounter: Payer: Self-pay | Admitting: *Deleted

## 2014-08-25 ENCOUNTER — Ambulatory Visit (HOSPITAL_BASED_OUTPATIENT_CLINIC_OR_DEPARTMENT_OTHER): Payer: Medicare Other | Admitting: Internal Medicine

## 2014-08-25 ENCOUNTER — Ambulatory Visit: Payer: Medicare Other | Admitting: Lab

## 2014-08-25 ENCOUNTER — Encounter: Payer: Self-pay | Admitting: Internal Medicine

## 2014-08-25 ENCOUNTER — Other Ambulatory Visit: Payer: Self-pay | Admitting: Internal Medicine

## 2014-08-25 VITALS — BP 130/94 | HR 85 | Temp 98.4°F | Resp 18 | Ht 72.0 in | Wt 168.0 lb

## 2014-08-25 DIAGNOSIS — C3411 Malignant neoplasm of upper lobe, right bronchus or lung: Secondary | ICD-10-CM

## 2014-08-25 DIAGNOSIS — C3410 Malignant neoplasm of upper lobe, unspecified bronchus or lung: Secondary | ICD-10-CM

## 2014-08-25 DIAGNOSIS — R591 Generalized enlarged lymph nodes: Secondary | ICD-10-CM

## 2014-08-25 DIAGNOSIS — I10 Essential (primary) hypertension: Secondary | ICD-10-CM

## 2014-08-25 DIAGNOSIS — E8809 Other disorders of plasma-protein metabolism, not elsewhere classified: Secondary | ICD-10-CM

## 2014-08-25 LAB — CBC WITH DIFFERENTIAL/PLATELET
BASO%: 0.3 % (ref 0.0–2.0)
BASOS ABS: 0 10*3/uL (ref 0.0–0.1)
EOS ABS: 0 10*3/uL (ref 0.0–0.5)
EOS%: 0.2 % (ref 0.0–7.0)
HCT: 34.3 % — ABNORMAL LOW (ref 38.4–49.9)
HGB: 11.3 g/dL — ABNORMAL LOW (ref 13.0–17.1)
LYMPH#: 1 10*3/uL (ref 0.9–3.3)
LYMPH%: 20.3 % (ref 14.0–49.0)
MCH: 29.2 pg (ref 27.2–33.4)
MCHC: 32.9 g/dL (ref 32.0–36.0)
MCV: 88.5 fL (ref 79.3–98.0)
MONO#: 0.8 10*3/uL (ref 0.1–0.9)
MONO%: 15.6 % — ABNORMAL HIGH (ref 0.0–14.0)
NEUT#: 3.2 10*3/uL (ref 1.5–6.5)
NEUT%: 63.6 % (ref 39.0–75.0)
Platelets: 260 10*3/uL (ref 140–400)
RBC: 3.88 10*6/uL — ABNORMAL LOW (ref 4.20–5.82)
RDW: 12.9 % (ref 11.0–14.6)
WBC: 5.1 10*3/uL (ref 4.0–10.3)

## 2014-08-25 LAB — COMPREHENSIVE METABOLIC PANEL (CC13)
ALT: 32 U/L (ref 0–55)
ANION GAP: 10 meq/L (ref 3–11)
AST: 22 U/L (ref 5–34)
Albumin: 2.3 g/dL — ABNORMAL LOW (ref 3.5–5.0)
Alkaline Phosphatase: 65 U/L (ref 40–150)
BILIRUBIN TOTAL: 0.42 mg/dL (ref 0.20–1.20)
BUN: 17 mg/dL (ref 7.0–26.0)
CHLORIDE: 103 meq/L (ref 98–109)
CO2: 25 mEq/L (ref 22–29)
Calcium: 9.9 mg/dL (ref 8.4–10.4)
Creatinine: 1.2 mg/dL (ref 0.7–1.3)
EGFR: 70 mL/min/{1.73_m2} — ABNORMAL LOW (ref >90)
Glucose: 94 mg/dl (ref 70–140)
Potassium: 4 mEq/L (ref 3.5–5.1)
SODIUM: 138 meq/L (ref 136–145)
Total Protein: 9.5 g/dL — ABNORMAL HIGH (ref 6.4–8.3)

## 2014-08-25 LAB — LACTATE DEHYDROGENASE (CC13): LDH: 158 U/L (ref 125–245)

## 2014-08-25 NOTE — Telephone Encounter (Signed)
Gave avs & cal for Dec/Jan. Sent mess to sch tx

## 2014-08-25 NOTE — Progress Notes (Signed)
[place] Telephone:([phone]   Fax:([phone]) [phone]  CONSULT NOTE  REFERRING PHYSICIAN: Dr. Merton Border  REASON FOR CONSULTATION:  68 years old African-American male recently diagnosed with lung cancer.  HPI Stephen Ellis is a 68 y.o. male was past medical history significant for multiple medical problems including history of plasma cell dyscrasia and was under my care until 2013 with the patient failed to show up for his follow-up visits. The patient also has a history of hypertension, anemia and dementia as well as blindness in the left eye. In early November 2015 The patient fell down at the boarding house. He was also complaining of shortness of breath and wheezes. He presented to the emergency Department at [facility]. CT angiogram of the chest as well as CT of the abdomen and pelvis were performed on 07/16/2014. The scan showed that the right lung demonstrates complete collapse of the right upper lobe with hyperexpansion of the right middle and lower lobes. There appears to be extrinsic compression of the right upper lobe bronchus likely related to a centrally obstructing mass although it is not easily discernible from the collapsed and consolidated right upper lobe. At the point of arterial distortion and the area measures approximately 3.3 cm. A 2.7 x 2.4 cm area of soft tissue density is noted at the origin of the upper lobe bronchus partially distorting the main right pulmonary artery. The patient was seen by Dr. Melvyn Novas and underwent a video flexible fiberoptic bronchoscopy on 07/29/2014. The final pathology was not conclusive for malignancy. On 08/12/2014 The patient was seen by Dr. Alva Garnet and he underwent repeat bronchoscopy which showed subtotal obstruction of the right upper lobe bronchus with mucosal infiltration involving the distal trachea and main carina. Transbronchial biopsies as well as brushings and endobronchial biopsies were performed.  The  final pathology (Accession: [id number]) showed minute fragments of highly atypical epithelioid cells suspicious for malignancy. The majority of the biopsy demonstrates benign lung with abundant mixed acute and chronic inflammation.There are a few fragmented and detached clusters of highly atypical basaloid cells that express cytokeratin AE1/AE3 and CD56; and are negative for chromogranin, cytokeratin 5/6, and TTF-1. Although the overall morphologic and immunophenotypic features are quite concerning for malignant neoplasm withneuroendocrine differentiation, the quantity of diagnostic tissue present prohibits definitive characterization. A PET scan was performed on 08/14/2014 and it showed Hypermetabolic mass in the central right upper lobe and involving the right hilum, consistent with primary bronchogenic carcinoma. Postobstructive right upper lobe collapse noted. Hypermetabolic mediastinal and bilateral hilar lymphadenopathy, consistent metastatic disease. Small less than 1 cm hypermetabolic upper abdominal lymph node at the level of the celiac axis. Lymph node metastasis cannot be excluded. The patient was referred to me today for further evaluation and recommendation regarding treatment of his condition. When seen today he continues to complain of shortness of breath at baseline and increased with exertion as well as cough occasional productive of blood-tinged sputum. He denied having any significant chest pain or frank hemoptysis. The patient lost around 15 pounds recently and has no night sweats. He denied having any headache or visual changes. Family history significant for a mother with heart disease and father had a stroke. The patient is single and has one son. He was accompanied by his nephew, Stephen Ellis. The patient lives in a boarding house. He is to work in Biomedical scientist. He has a history of smoking 1 pack per day for around 50 years and quit 1 year ago. No  history of alcohol or drug  abuse.  HPI  Past Medical History  Diagnosis Date  . Syncope   . Dehydration   . Blind left eye   . Hypertension   . Anemia     "low Blood"  . Dementia     poor memory    Past Surgical History  Procedure Laterality Date  . No past surgeries    . Video bronchoscopy Bilateral 07/29/2014    Procedure: VIDEO BRONCHOSCOPY WITHOUT FLUORO;  Surgeon: Tanda Rockers, MD;  Location: [facility] ENDOSCOPY;  Service: Cardiopulmonary;  Laterality: Bilateral;  . Video bronchoscopy Bilateral 08/12/2014    Procedure: VIDEO BRONCHOSCOPY WITHOUT FLUORO;  Surgeon: Wilhelmina Mcardle, MD;  Location: [facility] ENDOSCOPY;  Service: Cardiopulmonary;  Laterality: Bilateral;    History reviewed. No pertinent family history.  Social History History  Substance Use Topics  . Smoking status: Former Smoker -- 1.00 packs/day for 38 years    Quit date: 06/24/2014  . Smokeless tobacco: Never Used  . Alcohol Use: No    No Known Allergies  Current Outpatient Prescriptions  Medication Sig Dispense Refill  . budesonide-formoterol (SYMBICORT) 160-4.5 MCG/ACT inhaler Inhale 2 puffs into the lungs 2 (two) times daily. 1 Inhaler 12  . ipratropium-albuterol (DUONEB) 0.5-2.5 (3) MG/3ML SOLN Take 3 mLs by nebulization every 6 (six) hours. 360 mL 1  . acetaminophen-codeine (TYLENOL #3) 300-30 MG per tablet Take 1 tablet by mouth. q 4h prn  0  . amLODipine (NORVASC) 10 MG tablet Take 10 mg by mouth daily.    Marland Kitchen amoxicillin-clavulanate (AUGMENTIN) 875-125 MG per tablet      No current facility-administered medications for this visit.    Review of Systems  Constitutional: positive for anorexia, fatigue and weight loss Eyes: positive for Blind left eye Ears, nose, mouth, throat, and face: negative Respiratory: positive for cough, dyspnea on exertion and sputum Cardiovascular: negative Gastrointestinal: negative Genitourinary:negative Integument/breast: negative Hematologic/lymphatic: negative Musculoskeletal:positive for  muscle weakness Neurological: negative Behavioral/Psych: negative Endocrine: negative Allergic/Immunologic: negative  Physical Exam  QIO:NGEXB, healthy, no distress, well nourished and well developed SKIN: skin color, texture, turgor are normal, no rashes or significant lesions HEAD: Normocephalic, No masses, lesions, tenderness or abnormalities EYES: normal, PERRLA EARS: External ears normal, Canals clear OROPHARYNX:no exudate, no erythema and lips, buccal mucosa, and tongue normal  NECK: supple, no adenopathy, no JVD LYMPH:  no palpable lymphadenopathy, no hepatosplenomegaly LUNGS: clear to auscultation , and palpation HEART: regular rate & rhythm and no murmurs ABDOMEN:abdomen soft, non-tender, normal bowel sounds and no masses or organomegaly BACK: Back symmetric, no curvature., No CVA tenderness EXTREMITIES:no joint deformities, effusion, or inflammation, no edema, no skin discoloration  NEURO: alert & oriented x 3 with fluent speech, no focal motor/sensory deficits  PERFORMANCE STATUS: ECOG 1-2  LABORATORY DATA: Lab Results  Component Value Date   WBC 5.1 08/25/2014   HGB 11.3* 08/25/2014   HCT 34.3* 08/25/2014   MCV 88.5 08/25/2014   PLT 260 08/25/2014      Chemistry      Component Value Date/Time   NA 137 08/08/2014 0435   NA 137 07/20/2012 1327   K 3.8 08/08/2014 0435   K 4.2 07/20/2012 1327   CL 101 08/08/2014 0435   CL 105 07/20/2012 1327   CO2 25 08/08/2014 0435   CO2 28 07/20/2012 1327   BUN 11 08/08/2014 0435   BUN 16.0 07/20/2012 1327   CREATININE 1.36* 08/08/2014 0435   CREATININE 1.5* 07/20/2012 1327      Component  Value Date/Time   CALCIUM 8.7 08/08/2014 0435   CALCIUM 9.6 07/20/2012 1327   ALKPHOS 59 08/08/2014 0435   ALKPHOS 60 07/20/2012 1327   AST 17 08/08/2014 0435   AST 13 07/20/2012 1327   ALT 12 08/08/2014 0435   ALT 16 07/20/2012 1327   BILITOT 0.5 08/08/2014 0435   BILITOT 0.44 07/20/2012 1327       RADIOGRAPHIC  STUDIES: Dg Chest 2 View  08/07/2014   CLINICAL DATA:  Productive cough for 1 week  EXAM: CHEST  2 VIEW  COMPARISON:  07/16/2014  FINDINGS: Right upper lobe mass with right upper lobe collapse is stable. Normal heart size. Lungs otherwise clear. No pneumothorax. No pleural effusion.  IMPRESSION: Stable right upper lobe mass and collapse.   Electronically Signed   By: Maryclare Bean M.D.   On: 08/07/2014 20:35   Nm Pet Image Initial (pi) Skull Base To Thigh  08/14/2014   CLINICAL DATA:  Initial treatment strategy for right upper lobe pulmonary mass.  EXAM: NUCLEAR MEDICINE PET SKULL BASE TO THIGH  TECHNIQUE: 9.8 mCi F-18 FDG was injected intravenously. Full-ring PET imaging was performed from the skull base to thigh after the radiotracer. CT data was obtained and used for attenuation correction and anatomic localization.  FASTING BLOOD GLUCOSE:  Value: 104 mg/dl  COMPARISON:  CT on 07/16/2014  FINDINGS: NECK  No hypermetabolic lymph nodes in the neck. Focal areas of hypermetabolic activity are seen in both right and left thyroid lobes, suspicious for hypermetabolic thyroid nodules.  CHEST  Hypermetabolic mass is seen in the central right upper lobe and involving the right hilum with encasement and narrowing of the right mainstem bronchus. This measures approximately 4.6 cm in diameter and has SUV max of 16.3. This causes right upper lobe collapse. This is consistent with primary bronchogenic carcinoma.  There is also hypermetabolic mediastinal lymphadenopathy in the right paratracheal, precarinal, subcarinal regions. Hypermetabolic hilar lymphadenopathy is also seen bilaterally, right side greater than left. Right hilar lymphadenopathy has an SUV max of 4.9 and left hilar lymphadenopathy has SUV max of 3.3.  Airspace opacity seen in the medial right lower lobe which is hypermetabolic activity, however this appears inflammatory or infectious etiology.  ABDOMEN/PELVIS  No abnormal hypermetabolic activity within the  liver, pancreas, adrenal glands, or spleen.  A small less than 1 cm hypermetabolic lymph node is seen in the upper abdomen at the level the celiac axis which is SUV max of 4.0. No other hypermetabolic lymphadenopathy identified Within the abdomen or pelvis.  SKELETON  Hypermetabolic foci in noted in the right anterior first and second ribs corresponding to benign appearing rib fractures. No suspicious bone lesions identified.  IMPRESSION: Hypermetabolic mass in the central right upper lobe and involving the right hilum, consistent with primary bronchogenic carcinoma. Postobstructive right upper lobe collapse noted.  Hypermetabolic mediastinal and bilateral hilar lymphadenopathy, consistent metastatic disease.  Small less than 1 cm hypermetabolic upper abdominal lymph node at the level of the celiac axis. Lymph node metastasis cannot be excluded.  Right lower lobe patchy airspace disease shows hypermetabolic activity, and is likely inflammatory or infectious etiology.  Hypermetabolic foci in right and left thyroid lobes, suspicious for small hypermetabolic thyroid nodules. Thyroid ultrasound recommended for further evaluation.   Electronically Signed   By: Earle Gell M.D.   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 I had a lengthy discussion with the patient and his nephew today about his current disease stage, prognosis and treatment options.  I will complete the staging workup by ordering MRI of the brain to rule out brain metastasis. I also discussed with the patient his treatment options and recommended for him a course of concurrent chemoradiation with weekly carboplatin for AUC of 2  and paclitaxel 45 MG/M2 concurrent with radiation for a period of 6-7 weeks. I discussed with the patient adverse effect of this treatment including but not limited to alopecia, myelosuppression, nausea and vomiting, peripheral neuropathy, liver or renal dysfunction. I will refer the patient to radiation oncology for evaluation and discussion of the radiotherapy option. The patient would like to proceed with the treatment as planned and he is expected to start the first cycle of his treatment on 09/08/2014. He would have a chemotherapy education class before starting the first cycle of his treatment. I will call his pharmacy was prescription for Compazine 10 mg by mouth every 6 hours as needed for nausea.  2) plasma cell dyscrasia : The patient was diagnosed in 2013. He failed to follow-up for his visits and evaluation. I will repeat myeloma panel to evaluate his condition.   3) hypertension: Managed by his primary care physician  I gave the patient and his nephew the time to ask questions and I answered them completely to their satisfaction. He would come back for follow-up visit with the start of the first cycle of his chemotherapy. He was advised to call immediately if he has any concerning symptoms in the interval.  The patient voices understanding of current disease status and treatment options and is in agreement with the current care plan.  All questions were answered. The patient knows to call the clinic with any problems, questions or concerns. We can certainly see the patient much sooner if necessary.  Thank you so much for allowing me to participate in the care of Stephen Ellis. I will continue to follow up the patient with you and assist in his care.  I spent 55 minutes counseling the patient face to face. The total time spent in the appointment was 80 minutes.  Disclaimer: This note was dictated with voice recognition software. Similar sounding words can inadvertently be  transcribed and may not be corrected upon review.   Kimbella Heisler K. 08/25/2014, 11:56 AM

## 2014-08-25 NOTE — Telephone Encounter (Signed)
Per staff message and POF I have scheduled appts. Advised scheduler of appts. JMW  

## 2014-08-25 NOTE — [facility] Oncology Navigator Note (Unsigned)
I spoke with [name] at [facility] today.  He is a new lung cancer patient with history of MM.  I was with Dr. Julien Nordmann and patient for appt.  After Dr. Julien Nordmann spoke to patient, I gave him and is nephew information on lung cancer.  Patient is illiterate so I explained the information to patient.  I also help patient to fill out gas cards to [facility] and [facility] cancer [facility].  I have faxed both applications.

## 2014-08-27 LAB — IGG, IGA, IGM
IGA: 370 mg/dL (ref 68–379)
IGM, SERUM: 34 mg/dL — AB (ref 41–251)
IgG (Immunoglobin G), Serum: 2880 mg/dL — ABNORMAL HIGH (ref 650–1600)

## 2014-08-27 LAB — BETA 2 MICROGLOBULIN, SERUM: BETA 2 MICROGLOBULIN: 3.6 mg/L — AB (ref <=2.51)

## 2014-08-27 LAB — KAPPA/LAMBDA LIGHT CHAINS
KAPPA FREE LGHT CHN: 7.35 mg/dL — AB (ref 0.33–1.94)
KAPPA LAMBDA RATIO: 1.94 — AB (ref 0.26–1.65)
Lambda Free Lght Chn: 3.78 mg/dL — ABNORMAL HIGH (ref 0.57–2.63)

## 2014-09-01 ENCOUNTER — Inpatient Hospital Stay (HOSPITAL_COMMUNITY)
Admission: EM | Admit: 2014-09-01 | Discharge: 2014-09-13 | DRG: 193 | Disposition: A | Discharge status: Skilled Nursing Facility | Payer: Medicare Other | Attending: Internal Medicine | Admitting: Internal Medicine

## 2014-09-01 ENCOUNTER — Encounter (HOSPITAL_COMMUNITY): Payer: Self-pay | Admitting: Emergency Medicine

## 2014-09-01 ENCOUNTER — Emergency Department (HOSPITAL_COMMUNITY): Payer: Medicare Other

## 2014-09-01 DIAGNOSIS — D638 Anemia in other chronic diseases classified elsewhere: Secondary | ICD-10-CM | POA: Diagnosis present

## 2014-09-01 DIAGNOSIS — Y95 Nosocomial condition: Secondary | ICD-10-CM | POA: Diagnosis present

## 2014-09-01 DIAGNOSIS — J90 Pleural effusion, not elsewhere classified: Secondary | ICD-10-CM | POA: Diagnosis present

## 2014-09-01 DIAGNOSIS — C3491 Malignant neoplasm of unspecified part of right bronchus or lung: Secondary | ICD-10-CM | POA: Diagnosis not present

## 2014-09-01 DIAGNOSIS — E878 Other disorders of electrolyte and fluid balance, not elsewhere classified: Secondary | ICD-10-CM | POA: Diagnosis present

## 2014-09-01 DIAGNOSIS — C3411 Malignant neoplasm of upper lobe, right bronchus or lung: Secondary | ICD-10-CM

## 2014-09-01 DIAGNOSIS — N179 Acute kidney failure, unspecified: Secondary | ICD-10-CM | POA: Diagnosis not present

## 2014-09-01 DIAGNOSIS — Z792 Long term (current) use of antibiotics: Secondary | ICD-10-CM | POA: Diagnosis not present

## 2014-09-01 DIAGNOSIS — H5442 Blindness, left eye, normal vision right eye: Secondary | ICD-10-CM | POA: Diagnosis present

## 2014-09-01 DIAGNOSIS — J9601 Acute respiratory failure with hypoxia: Secondary | ICD-10-CM | POA: Diagnosis present

## 2014-09-01 DIAGNOSIS — E876 Hypokalemia: Secondary | ICD-10-CM | POA: Diagnosis present

## 2014-09-01 DIAGNOSIS — D649 Anemia, unspecified: Secondary | ICD-10-CM | POA: Diagnosis present

## 2014-09-01 DIAGNOSIS — D7589 Other specified diseases of blood and blood-forming organs: Secondary | ICD-10-CM | POA: Diagnosis present

## 2014-09-01 DIAGNOSIS — R0602 Shortness of breath: Secondary | ICD-10-CM | POA: Diagnosis not present

## 2014-09-01 DIAGNOSIS — Z87891 Personal history of nicotine dependence: Secondary | ICD-10-CM | POA: Diagnosis not present

## 2014-09-01 DIAGNOSIS — F0390 Unspecified dementia without behavioral disturbance: Secondary | ICD-10-CM | POA: Diagnosis present

## 2014-09-01 DIAGNOSIS — Z79899 Other long term (current) drug therapy: Secondary | ICD-10-CM

## 2014-09-01 DIAGNOSIS — D630 Anemia in neoplastic disease: Secondary | ICD-10-CM | POA: Diagnosis present

## 2014-09-01 DIAGNOSIS — C3490 Malignant neoplasm of unspecified part of unspecified bronchus or lung: Secondary | ICD-10-CM

## 2014-09-01 DIAGNOSIS — J189 Pneumonia, unspecified organism: Secondary | ICD-10-CM | POA: Diagnosis not present

## 2014-09-01 DIAGNOSIS — E861 Hypovolemia: Secondary | ICD-10-CM | POA: Diagnosis present

## 2014-09-01 DIAGNOSIS — I10 Essential (primary) hypertension: Secondary | ICD-10-CM | POA: Diagnosis not present

## 2014-09-01 DIAGNOSIS — D72819 Decreased white blood cell count, unspecified: Secondary | ICD-10-CM | POA: Diagnosis present

## 2014-09-01 DIAGNOSIS — E871 Hypo-osmolality and hyponatremia: Secondary | ICD-10-CM | POA: Diagnosis present

## 2014-09-01 DIAGNOSIS — Z510 Encounter for antineoplastic radiation therapy: Secondary | ICD-10-CM | POA: Diagnosis present

## 2014-09-01 DIAGNOSIS — E8809 Other disorders of plasma-protein metabolism, not elsewhere classified: Secondary | ICD-10-CM | POA: Diagnosis present

## 2014-09-01 DIAGNOSIS — J9819 Other pulmonary collapse: Secondary | ICD-10-CM | POA: Diagnosis present

## 2014-09-01 DIAGNOSIS — Z85118 Personal history of other malignant neoplasm of bronchus and lung: Secondary | ICD-10-CM

## 2014-09-01 DIAGNOSIS — T17908S Unspecified foreign body in respiratory tract, part unspecified causing other injury, sequela: Secondary | ICD-10-CM

## 2014-09-01 DIAGNOSIS — J9600 Acute respiratory failure, unspecified whether with hypoxia or hypercapnia: Secondary | ICD-10-CM | POA: Diagnosis not present

## 2014-09-01 LAB — BASIC METABOLIC PANEL WITH GFR
Anion gap: 6 (ref 5–15)
BUN: 14 mg/dL (ref 6–23)
CO2: 25 mmol/L (ref 19–32)
Calcium: 9.2 mg/dL (ref 8.4–10.5)
Chloride: 103 meq/L (ref 96–112)
Creatinine, Ser: 1.05 mg/dL (ref 0.50–1.35)
GFR calc Af Amer: 83 mL/min — ABNORMAL LOW
GFR calc non Af Amer: 72 mL/min — ABNORMAL LOW
Glucose, Bld: 109 mg/dL — ABNORMAL HIGH (ref 70–99)
Potassium: 3.5 mmol/L (ref 3.5–5.1)
Sodium: 134 mmol/L — ABNORMAL LOW (ref 135–145)

## 2014-09-01 LAB — URINE MICROSCOPIC-ADD ON

## 2014-09-01 LAB — URINALYSIS, ROUTINE W REFLEX MICROSCOPIC
Bilirubin Urine: NEGATIVE
Glucose, UA: NEGATIVE mg/dL
Hgb urine dipstick: NEGATIVE
Ketones, ur: NEGATIVE mg/dL
Nitrite: NEGATIVE
Protein, ur: 30 mg/dL — AB
Specific Gravity, Urine: 1.023 (ref 1.005–1.030)
Urobilinogen, UA: 1 mg/dL (ref 0.0–1.0)
pH: 5.5 (ref 5.0–8.0)

## 2014-09-01 LAB — CBC WITH DIFFERENTIAL/PLATELET
Basophils Absolute: 0 10*3/uL (ref 0.0–0.1)
Basophils Relative: 0 % (ref 0–1)
Eosinophils Absolute: 0 10*3/uL (ref 0.0–0.7)
Eosinophils Relative: 0 % (ref 0–5)
HCT: 34.7 % — ABNORMAL LOW (ref 39.0–52.0)
Hemoglobin: 11.7 g/dL — ABNORMAL LOW (ref 13.0–17.0)
Lymphocytes Relative: 11 % — ABNORMAL LOW (ref 12–46)
Lymphs Abs: 0.6 10*3/uL — ABNORMAL LOW (ref 0.7–4.0)
MCH: 29.5 pg (ref 26.0–34.0)
MCHC: 33.7 g/dL (ref 30.0–36.0)
MCV: 87.6 fL (ref 78.0–100.0)
Monocytes Absolute: 0.4 10*3/uL (ref 0.1–1.0)
Monocytes Relative: 7 % (ref 3–12)
Neutro Abs: 4.4 10*3/uL (ref 1.7–7.7)
Neutrophils Relative %: 82 % — ABNORMAL HIGH (ref 43–77)
Platelets: 230 10*3/uL (ref 150–400)
RBC: 3.96 MIL/uL — ABNORMAL LOW (ref 4.22–5.81)
RDW: 12.8 % (ref 11.5–15.5)
WBC: 5.4 10*3/uL (ref 4.0–10.5)

## 2014-09-01 LAB — PROCALCITONIN: PROCALCITONIN: 0.55 ng/mL

## 2014-09-01 MED ORDER — DEXTROSE 5 % IV SOLN
1.0000 g | Freq: Three times a day (TID) | INTRAVENOUS | Status: DC
  Administered 2014-09-01 – 2014-09-09 (×24): 1 g via INTRAVENOUS
  Filled 2014-09-01 (×24): qty 1

## 2014-09-01 MED ORDER — DEXTROSE 5 % IV SOLN
2.0000 g | INTRAVENOUS | Status: AC
  Administered 2014-09-01: 2 g via INTRAVENOUS
  Filled 2014-09-01: qty 2

## 2014-09-01 MED ORDER — ONDANSETRON HCL 4 MG/2ML IJ SOLN
4.0000 mg | Freq: Four times a day (QID) | INTRAMUSCULAR | Status: DC | PRN
  Administered 2014-09-04: 4 mg via INTRAVENOUS
  Filled 2014-09-01: qty 2

## 2014-09-01 MED ORDER — ENSURE COMPLETE PO LIQD
237.0000 mL | Freq: Two times a day (BID) | ORAL | Status: DC
  Administered 2014-09-02 – 2014-09-13 (×20): 237 mL via ORAL

## 2014-09-01 MED ORDER — VANCOMYCIN HCL IN DEXTROSE 1-5 GM/200ML-% IV SOLN
1000.0000 mg | Freq: Once | INTRAVENOUS | Status: AC
  Administered 2014-09-01: 1000 mg via INTRAVENOUS
  Filled 2014-09-01: qty 200

## 2014-09-01 MED ORDER — ONDANSETRON HCL 4 MG PO TABS: 4.0000 mg | ORAL_TABLET | Freq: Four times a day (QID) | ORAL | Status: DC | PRN

## 2014-09-01 MED ORDER — VANCOMYCIN HCL IN DEXTROSE 1-5 GM/200ML-% IV SOLN
1000.0000 mg | Freq: Two times a day (BID) | INTRAVENOUS | Status: DC
  Administered 2014-09-01 – 2014-09-09 (×16): 1000 mg via INTRAVENOUS
  Filled 2014-09-01 (×16): qty 200

## 2014-09-01 MED ORDER — ACETAMINOPHEN 325 MG PO TABS
650.0000 mg | ORAL_TABLET | Freq: Four times a day (QID) | ORAL | Status: DC | PRN
  Administered 2014-09-01 – 2014-09-08 (×9): 650 mg via ORAL
  Filled 2014-09-01 (×9): qty 2

## 2014-09-01 MED ORDER — IPRATROPIUM BROMIDE 0.02 % IN SOLN
0.5000 mg | Freq: Once | RESPIRATORY_TRACT | Status: AC
  Administered 2014-09-01: 0.5 mg via RESPIRATORY_TRACT
  Filled 2014-09-01: qty 2.5

## 2014-09-01 MED ORDER — IPRATROPIUM-ALBUTEROL 0.5-2.5 (3) MG/3ML IN SOLN
3.0000 mL | Freq: Four times a day (QID) | RESPIRATORY_TRACT | Status: DC
  Administered 2014-09-01 (×3): 3 mL via RESPIRATORY_TRACT
  Filled 2014-09-01 (×4): qty 3

## 2014-09-01 MED ORDER — ACETAMINOPHEN 650 MG RE SUPP
650.0000 mg | Freq: Four times a day (QID) | RECTAL | Status: DC | PRN
  Administered 2014-09-09 – 2014-09-10 (×4): 650 mg via RECTAL
  Filled 2014-09-01 (×4): qty 1

## 2014-09-01 MED ORDER — ENOXAPARIN SODIUM 40 MG/0.4ML ~~LOC~~ SOLN
40.0000 mg | SUBCUTANEOUS | Status: DC
  Administered 2014-09-01 – 2014-09-11 (×11): 40 mg via SUBCUTANEOUS
  Filled 2014-09-01 (×11): qty 0.4

## 2014-09-01 MED ORDER — SODIUM CHLORIDE 0.9 % IJ SOLN
3.0000 mL | Freq: Two times a day (BID) | INTRAMUSCULAR | Status: DC
  Administered 2014-09-02 – 2014-09-13 (×8): 3 mL via INTRAVENOUS

## 2014-09-01 MED ORDER — ALBUTEROL SULFATE (2.5 MG/3ML) 0.083% IN NEBU
5.0000 mg | INHALATION_SOLUTION | Freq: Once | RESPIRATORY_TRACT | Status: AC
  Administered 2014-09-01: 5 mg via RESPIRATORY_TRACT
  Filled 2014-09-01: qty 6

## 2014-09-01 NOTE — ED Notes (Addendum)
Pt reported he "had a crick in his neck". rn offered tylenol. Pt accepted. rn asked if pt had pain. Pt denies pain.

## 2014-09-01 NOTE — ED Notes (Signed)
Bed: HF02 Expected date:  Expected time:  Means of arrival:  Comments: EMS CA pt. Weakness

## 2014-09-01 NOTE — Consult Note (Signed)
Name: Stephen Ellis MRN: [id number] DOB: Jul 10, 1946    ADMISSION DATE:  09/01/2014 CONSULTATION DATE:  09/01/2014  REFERRING MD :  Tat  CHIEF COMPLAINT:  Lung cancer and post obstructive pneumonia.  BRIEF PATIENT DESCRIPTION: 68 year old male with recent diagnosis of stage IIIB/IV lung cancer by bronchoscopy earlier this month.  The patient apparently was not told of the diagnosis per family's request.  PET scan positive only for the RUL with some hilar involvement.  Returns to the ED with SOB and DOE.  CXR showed RUL collapse and ?of post obstructive PNA and PCCM was asked to see patient on consultation for above.  Patient denied any fever, chills or purulent sputum production.  Denied hemptysis.  SIGNIFICANT EVENTS  12/28 to ED for SOB.  STUDIES:  12/28 CXR with RUL collapse.  PAST MEDICAL HISTORY :   has a past medical history of Syncope; Dehydration; Blind left eye; Hypertension; Anemia; Dementia; and Cancer.  has past surgical history that includes No past surgeries; Video bronchoscopy (Bilateral, 07/29/2014); and Video bronchoscopy (Bilateral, 08/12/2014). Prior to Admission medications   Medication Sig Start Date End Date Taking? Authorizing Provider  acetaminophen-codeine (TYLENOL #3) 300-30 MG per tablet Take 1 tablet by mouth every 4 (four) hours as needed for moderate pain. q 4h prn 07/25/14  Yes Historical Provider, MD  amLODipine (NORVASC) 10 MG tablet Take 10 mg by mouth daily.   Yes Historical Provider, MD  amoxicillin-clavulanate (AUGMENTIN) 875-125 MG per tablet Take 1 tablet by mouth 2 (two) times daily.  08/19/14  Yes Historical Provider, MD  budesonide-formoterol (SYMBICORT) 160-4.5 MCG/ACT inhaler Inhale 2 puffs into the lungs 2 (two) times daily. 08/09/14  Yes Thurnell Lose, MD  ipratropium-albuterol (DUONEB) 0.5-2.5 (3) MG/3ML SOLN Take 3 mLs by nebulization every 6 (six) hours. 08/09/14  Yes Thurnell Lose, MD   No Known Allergies  FAMILY HISTORY:  family  history is not on file. SOCIAL HISTORY:  reports that he quit smoking about 2 months ago. He has never used smokeless tobacco. He reports that he does not drink alcohol or use illicit drugs.  REVIEW OF SYSTEMS:   Constitutional: Negative for fever, chills, weight loss, malaise/fatigue and diaphoresis.  HENT: Negative for hearing loss, ear pain, nosebleeds, congestion, sore throat, neck pain, tinnitus and ear discharge.   Eyes: Negative for blurred vision, double vision, photophobia, pain, discharge and redness.  Respiratory: Negative for cough, hemoptysis, sputum production, shortness of breath, wheezing and stridor.   Cardiovascular: Negative for chest pain, palpitations, orthopnea, claudication, leg swelling and PND.  Gastrointestinal: Negative for heartburn, nausea, vomiting, abdominal pain, diarrhea, constipation, blood in stool and melena.  Genitourinary: Negative for dysuria, urgency, frequency, hematuria and flank pain.  Musculoskeletal: Negative for myalgias, back pain, joint pain and falls.  Skin: Negative for itching and rash.  Neurological: Negative for dizziness, tingling, tremors, sensory change, speech change, focal weakness, seizures, loss of consciousness, weakness and headaches.  Endo/Heme/Allergies: Negative for environmental allergies and polydipsia. Does not bruise/bleed easily.  SUBJECTIVE:   VITAL SIGNS: Temp:  [98.2 F (36.8 C)-98.8 F (37.1 C)] 98.2 F (36.8 C) (12/28 0715) Pulse Rate:  [86-104] 86 (12/28 1030) Resp:  [13-33] 17 (12/28 1030) BP: (113-130)/(73-94) 128/89 mmHg (12/28 1030) SpO2:  [90 %-100 %] 100 % (12/28 1030) Weight:  [76.204 kg (168 lb)] 76.204 kg (168 lb) (12/28 0920)  PHYSICAL EXAMINATION: General:  Chronically ill appearing male, NAD on 2L [place]. Neuro:  Awake but confused at times, oriented to self  and place.  Moves all ext to command. HEENT:  [place]/AT, PERRL, EOM-I and MMM. Cardiovascular:  RRR, Nl S1/S2, -M/R/G. Lungs:  Mild end exp  wheezing.  Abdomen:  Soft, NT, ND and +BS. Musculoskeletal:  -edema and -tenderness. Skin:  Intact.   Recent Labs Lab 08/25/14 1128 09/01/14 0433  NA 138 134*  K 4.0 3.5  CL  --  103  CO2 25 25  BUN 17.0 14  CREATININE 1.2 1.05  GLUCOSE 94 109*    Recent Labs Lab 08/25/14 1127 09/01/14 0433  HGB 11.3* 11.7*  HCT 34.3* 34.7*  WBC 5.1 5.4  PLT 260 230   Dg Chest 2 View  09/01/2014   CLINICAL DATA:  Shortness of breath  EXAM: CHEST  2 VIEW  COMPARISON:  08/07/2014  FINDINGS: Progressive collapse of the right lung, completely involving the right upper and middle lobes. There is opacity within the aerated right lower lobe, compatible with postobstructive changes. A known right perihilar mass is difficult to visualize due to the extensive parenchymal opacity. The left lung is well aerated. Heart size is normal.  IMPRESSION: Lung cancer with progressive right lung collapse, now involving the right upper and middle lobes. There is postobstructive pneumonia or pneumonitis in the right lower lobe.   Electronically Signed   By: Jorje Guild M.D.   On: 09/01/2014 04:19    ASSESSMENT / PLAN:  68 year old male 50 pack year history of smoking, recent diagnosis of advanced stage lung cancer presenting with dyspnea.  WBC is stable and patient has not B type symptoms making an acute infection less than likely.  The SOB is very subjective and patient does not have any evidence to support a true pneumonia.  Started on abx per primary.  Cultures sent.  H/O consulted.  Plan: - No indication for additional bronchoscopy at this time. - Abx as ordered by primary.  If cultures are negative would d/c immediately. - Check PCT protocol, if negative then would d/c abx. - H/O to address cancer. - A possibility would be targeted radiation therapy vs stenting but would recommend referral to a tertiary center for that. - Titrate O2 for sat of 88-92%. - Ambulate as able. - Will f/u in AM.  Rush Farmer, M.D. [facility], [place] Pulmonary/Critical Care Medicine. Pager: [phone]. After hours pager: [phone].  09/01/2014, 11:16 AM

## 2014-09-01 NOTE — Progress Notes (Signed)
ANTIBIOTIC CONSULT NOTE - INITIAL  Pharmacy Consult for Vancomycin, Cefepime Indication: pneumonia  No Known Allergies  Patient Measurements: Weight: 168 lb (76.204 kg) Weight: 76.2 kg on 08/25/14 Height: 72 inches  Vital Signs: Temp: 98.2 F (36.8 C) (12/28 1156) Temp Source: Oral (12/28 1156) BP: 123/82 mmHg (12/28 1156) Pulse Rate: 82 (12/28 1156) Intake/Output from previous day:   Intake/Output from this shift: Total I/O In: -  Out: 225 [Urine:225]  Labs:  Recent Labs  09/01/14 0433  WBC 5.4  HGB 11.7*  PLT 230  CREATININE 1.05   Estimated Creatinine Clearance: 74.6 mL/min (by C-G formula based on Cr of 1.05). No results for input(s): VANCOTROUGH, VANCOPEAK, VANCORANDOM, GENTTROUGH, GENTPEAK, GENTRANDOM, TOBRATROUGH, TOBRAPEAK, TOBRARND, AMIKACINPEAK, AMIKACINTROU, AMIKACIN in the last 72 hours.   Microbiology: Recent Results (from the past 720 hour(s))  Blood culture (routine x 2)     Status: None   Collection Time: 08/07/14  8:17 PM  Result Value Ref Range Status   Specimen Description BLOOD LEFT ARM  Final   Special Requests BOTTLES DRAWN AEROBIC ONLY 5CC  Final   Culture  Setup Time   Final    08/08/2014 00:55 Performed at [place]   Final    NO GROWTH 5 DAYS Performed at [facility]    Report Status 08/14/2014 FINAL  Final  Blood culture (routine x 2)     Status: None   Collection Time: 08/07/14  8:20 PM  Result Value Ref Range Status   Specimen Description BLOOD RIGHT ARM  Final   Special Requests BOTTLES DRAWN AEROBIC AND ANAEROBIC 5CC  Final   Culture  Setup Time   Final    08/08/2014 00:54 Performed at [place]   Final    NO GROWTH 5 DAYS Performed at [facility]    Report Status 08/14/2014 FINAL  Final    Medical History: Past Medical History  Diagnosis Date  . Syncope   . Dehydration   . Blind left eye   . Hypertension   . Anemia     "low Blood"  . Dementia     poor memory  . Cancer     Lung    Assessment: 68 y/o M with newly-diagnosed lung cancer who presents with ShOB and weakness. Plan is to begin chemoradiation on 09/08/14. CXR shows postobstructive pneumonia or pneumonitis in the right lower lobe. Pharmacy is consulted to dose Vancomycin and Cefepime for presumed HCAP.  12/28 >> Vancomycin >> 12/28 >> Cefepime >>  Tmax: Afebrile WBC: 5.4K Renal: SCr 1.05, CrCl 75 ml/min CG, 69 ml/min Normalized  No cultures ordered  Goal of Therapy:  Vancomycin trough level 15-20 mcg/mL Appropriate antibiotic dosing for renal function, indication Eradication of infection  Plan:   Vancomycin 1 gram IV q12h (first dose given in the ED)  Plan for Vancomycin trough level at steady state  Continue current Cefepime dosing of 2 grams IV x 1 (given in ED), then 1 gram IV q8h  Follow up renal function, culture results as available, clinical course   Lindell Spar, PharmD, BCPS Pager: [phone] 09/01/2014 9:15 AM

## 2014-09-01 NOTE — Progress Notes (Signed)
ANTIBIOTIC CONSULT NOTE - INITIAL  Pharmacy Consult for Cefepime Indication: pneumonia  No Known Allergies  Patient Measurements:   Weight: 76.2kg on 08/25/14 Height: 72 inches  Vital Signs: Temp: 98.5 F (36.9 C) (12/28 0302) Temp Source: Oral (12/28 0302) BP: 130/94 mmHg (12/28 0435) Pulse Rate: 99 (12/28 0435) Intake/Output from previous day:   Intake/Output from this shift:    Labs:  Recent Labs  09/01/14 0433  WBC 5.4  HGB 11.7*  PLT 230  CREATININE 1.05   Estimated Creatinine Clearance: 74.6 mL/min (by C-G formula based on Cr of 1.05). No results for input(s): VANCOTROUGH, VANCOPEAK, VANCORANDOM, GENTTROUGH, GENTPEAK, GENTRANDOM, TOBRATROUGH, TOBRAPEAK, TOBRARND, AMIKACINPEAK, AMIKACINTROU, AMIKACIN in the last 72 hours.   Microbiology: Recent Results (from the past 720 hour(s))  Blood culture (routine x 2)     Status: None   Collection Time: 08/07/14  8:17 PM  Result Value Ref Range Status   Specimen Description BLOOD LEFT ARM  Final   Special Requests BOTTLES DRAWN AEROBIC ONLY 5CC  Final   Culture  Setup Time   Final    08/08/2014 00:55 Performed at [facility]   Final    NO GROWTH 5 DAYS Performed at [facility]    Report Status 08/14/2014 FINAL  Final  Blood culture (routine x 2)     Status: None   Collection Time: 08/07/14  8:20 PM  Result Value Ref Range Status   Specimen Description BLOOD RIGHT ARM  Final   Special Requests BOTTLES DRAWN AEROBIC AND ANAEROBIC 5CC  Final   Culture  Setup Time   Final    08/08/2014 00:54 Performed at [place]   Final    NO GROWTH 5 DAYS Performed at [facility]    Report Status 08/14/2014 FINAL  Final    Medical History: Past Medical History  Diagnosis Date  . Syncope   . Dehydration   . Blind left eye   . Hypertension   . Anemia     "low Blood"  . Dementia     poor memory  . Cancer     Lung    Medications:  Scheduled:    Infusions:  . ceFEPime (MAXIPIME) IV    . vancomycin     PRN:   Assessment: 68 yo male with newly-diagnosed lung cancer who presents with [facility] and weakness. Plan is to begin chemoradiation on 09/08/14. CXR shows postobstructive pneumonia or pneumonitis in the right lower lobe. Patient was given vancomycin 1g in ED and pharmacy is consulted to dose cefepime for presumed HCAP.  12/28 Vancomycin x 1 12/28 >> Cefepime >>  Tmax: Afebrile WBC: 5.4k Renal: SCr 1.05, CrCl 75 ml/min CG, 69 ml/min Normalized  No cultures ordered  Goal of Therapy:  Cefepime dose appropriate for indication, renal function  Plan:   Cefepime 2g IV x 1, then 1g IV q8h Follow up renal function & cultures, clinical course?  Peggyann Juba, PharmD, BCPS Pager: [phone] 09/01/2014,5:46 AM

## 2014-09-01 NOTE — ED Provider Notes (Signed)
CSN: [id number]     Arrival date & time 09/01/14  3570 History   First MD Initiated Contact with Patient 09/01/14 [phone]     Chief Complaint  Patient presents with  . Shortness of Breath     (Consider location/radiation/quality/duration/timing/severity/associated sxs/prior Treatment) Patient is a 68 y.o. male presenting with shortness of breath. The history is provided by the patient and a relative. No language interpreter was used.  Shortness of Breath Severity:  Moderate Onset quality:  Sudden Associated symptoms: cough   Associated symptoms: no fever and no vomiting   Associated symptoms comment:  The patient woke from sleep with SOB and "not feeling well". No fever. He has not been vomiting. He has a cough but has a usual cough as well. He denies chest pain. He has a recent diagnosis of bronchogenic carcinoma, scheduled for chemoradiation to begin on January 4th. He has home nebulizer but this was not used this morning prior to arrival.    Past Medical History  Diagnosis Date  . Syncope   . Dehydration   . Blind left eye   . Hypertension   . Anemia     "low Blood"  . Dementia     poor memory  . Cancer     Lung   Past Surgical History  Procedure Laterality Date  . No past surgeries    . Video bronchoscopy Bilateral 07/29/2014    Procedure: VIDEO BRONCHOSCOPY WITHOUT FLUORO;  Surgeon: Tanda Rockers, MD;  Location: [facility] ENDOSCOPY;  Service: Cardiopulmonary;  Laterality: Bilateral;  . Video bronchoscopy Bilateral 08/12/2014    Procedure: VIDEO BRONCHOSCOPY WITHOUT FLUORO;  Surgeon: Wilhelmina Mcardle, MD;  Location: [facility] ENDOSCOPY;  Service: Cardiopulmonary;  Laterality: Bilateral;   No family history on file. History  Substance Use Topics  . Smoking status: Former Smoker -- 1.00 packs/day for 38 years    Quit date: 06/24/2014  . Smokeless tobacco: Never Used  . Alcohol Use: No    Review of Systems  Constitutional: Negative for fever and chills.  HENT: Negative.    Respiratory: Positive for cough and shortness of breath.   Cardiovascular: Negative.   Gastrointestinal: Negative.  Negative for nausea and vomiting.  Musculoskeletal: Negative.   Skin: Negative.   Neurological: Negative.       Allergies  Review of patient's allergies indicates no known allergies.  Home Medications   Prior to Admission medications   Medication Sig Start Date End Date Taking? Authorizing Provider  acetaminophen-codeine (TYLENOL #3) 300-30 MG per tablet Take 1 tablet by mouth every 4 (four) hours as needed for moderate pain. q 4h prn 07/25/14  Yes Historical Provider, MD  amLODipine (NORVASC) 10 MG tablet Take 10 mg by mouth daily.   Yes Historical Provider, MD  amoxicillin-clavulanate (AUGMENTIN) 875-125 MG per tablet Take 1 tablet by mouth 2 (two) times daily.  08/19/14  Yes Historical Provider, MD  budesonide-formoterol (SYMBICORT) 160-4.5 MCG/ACT inhaler Inhale 2 puffs into the lungs 2 (two) times daily. 08/09/14  Yes Thurnell Lose, MD  ipratropium-albuterol (DUONEB) 0.5-2.5 (3) MG/3ML SOLN Take 3 mLs by nebulization every 6 (six) hours. 08/09/14  Yes Thurnell Lose, MD   BP 120/83 mmHg  Pulse 94  Temp(Src) 98.5 F (36.9 C) (Oral)  Resp 24  SpO2 96% Physical Exam  Constitutional: He is oriented to person, place, and time. He appears well-developed and well-nourished.  HENT:  Head: Normocephalic.  Eyes:  Chronic left corneal abnormality.  Neck: Normal range of motion. Neck supple.  Cardiovascular: Normal rate and regular rhythm.   Pulmonary/Chest: Effort normal and breath sounds normal.  Absent air movement in right upper lobe.   Abdominal: Soft. Bowel sounds are normal. There is no tenderness. There is no rebound and no guarding.  Musculoskeletal: Normal range of motion.  Neurological: He is alert and oriented to person, place, and time.  Skin: Skin is warm and dry. No rash noted.  Psychiatric: He has a normal mood and affect.    ED Course   Procedures (including critical care time) Labs Review Labs Reviewed  CBC WITH DIFFERENTIAL  BASIC METABOLIC PANEL  URINALYSIS, ROUTINE W REFLEX MICROSCOPIC   Results for orders placed or performed during the hospital encounter of 09/01/14  CBC with Differential  Result Value Ref Range   WBC 5.4 4.0 - 10.5 K/uL   RBC 3.96 (L) 4.22 - 5.81 MIL/uL   Hemoglobin 11.7 (L) 13.0 - 17.0 g/dL   HCT 34.7 (L) 39.0 - 52.0 %   MCV 87.6 78.0 - 100.0 fL   MCH 29.5 26.0 - 34.0 pg   MCHC 33.7 30.0 - 36.0 g/dL   RDW 12.8 11.5 - 15.5 %   Platelets 230 150 - 400 K/uL   Neutrophils Relative % 82 (H) 43 - 77 %   Neutro Abs 4.4 1.7 - 7.7 K/uL   Lymphocytes Relative 11 (L) 12 - 46 %   Lymphs Abs 0.6 (L) 0.7 - 4.0 K/uL   Monocytes Relative 7 3 - 12 %   Monocytes Absolute 0.4 0.1 - 1.0 K/uL   Eosinophils Relative 0 0 - 5 %   Eosinophils Absolute 0.0 0.0 - 0.7 K/uL   Basophils Relative 0 0 - 1 %   Basophils Absolute 0.0 0.0 - 0.1 K/uL  Basic metabolic panel  Result Value Ref Range   Sodium 134 (L) 135 - 145 mmol/L   Potassium 3.5 3.5 - 5.1 mmol/L   Chloride 103 96 - 112 mEq/L   CO2 25 19 - 32 mmol/L   Glucose, Bld 109 (H) 70 - 99 mg/dL   BUN 14 6 - 23 mg/dL   Creatinine, Ser 1.05 0.50 - 1.35 mg/dL   Calcium 9.2 8.4 - 10.5 mg/dL   GFR calc non Af Amer 72 (L) >90 mL/min   GFR calc Af Amer 83 (L) >90 mL/min   Anion gap 6 5 - 15   Dg Chest 2 View  09/01/2014   CLINICAL DATA:  Shortness of breath  EXAM: CHEST  2 VIEW  COMPARISON:  08/07/2014  FINDINGS: Progressive collapse of the right lung, completely involving the right upper and middle lobes. There is opacity within the aerated right lower lobe, compatible with postobstructive changes. A known right perihilar mass is difficult to visualize due to the extensive parenchymal opacity. The left lung is well aerated. Heart size is normal.  IMPRESSION: Lung cancer with progressive right lung collapse, now involving the right upper and middle lobes. There is  postobstructive pneumonia or pneumonitis in the right lower lobe.   Electronically Signed   By: Jorje Guild M.D.   On: 09/01/2014 04:19   Dg Chest 2 View  08/07/2014   CLINICAL DATA:  Productive cough for 1 week  EXAM: CHEST  2 VIEW  COMPARISON:  07/16/2014  FINDINGS: Right upper lobe mass with right upper lobe collapse is stable. Normal heart size. Lungs otherwise clear. No pneumothorax. No pleural effusion.  IMPRESSION: Stable right upper lobe mass and collapse.   Electronically Signed   By:  Art  Hoss M.D.   On: 08/07/2014 20:35   Nm Pet Image Initial (pi) Skull Base To Thigh  08/14/2014   CLINICAL DATA:  Initial treatment strategy for right upper lobe pulmonary mass.  EXAM: NUCLEAR MEDICINE PET SKULL BASE TO THIGH  TECHNIQUE: 9.8 mCi F-18 FDG was injected intravenously. Full-ring PET imaging was performed from the skull base to thigh after the radiotracer. CT data was obtained and used for attenuation correction and anatomic localization.  FASTING BLOOD GLUCOSE:  Value: 104 mg/dl  COMPARISON:  CT on 07/16/2014  FINDINGS: NECK  No hypermetabolic lymph nodes in the neck. Focal areas of hypermetabolic activity are seen in both right and left thyroid lobes, suspicious for hypermetabolic thyroid nodules.  CHEST  Hypermetabolic mass is seen in the central right upper lobe and involving the right hilum with encasement and narrowing of the right mainstem bronchus. This measures approximately 4.6 cm in diameter and has SUV max of 16.3. This causes right upper lobe collapse. This is consistent with primary bronchogenic carcinoma.  There is also hypermetabolic mediastinal lymphadenopathy in the right paratracheal, precarinal, subcarinal regions. Hypermetabolic hilar lymphadenopathy is also seen bilaterally, right side greater than left. Right hilar lymphadenopathy has an SUV max of 4.9 and left hilar lymphadenopathy has SUV max of 3.3.  Airspace opacity seen in the medial right lower lobe which is hypermetabolic  activity, however this appears inflammatory or infectious etiology.  ABDOMEN/PELVIS  No abnormal hypermetabolic activity within the liver, pancreas, adrenal glands, or spleen.  A small less than 1 cm hypermetabolic lymph node is seen in the upper abdomen at the level the celiac axis which is SUV max of 4.0. No other hypermetabolic lymphadenopathy identified Within the abdomen or pelvis.  SKELETON  Hypermetabolic foci in noted in the right anterior first and second ribs corresponding to benign appearing rib fractures. No suspicious bone lesions identified.  IMPRESSION: Hypermetabolic mass in the central right upper lobe and involving the right hilum, consistent with primary bronchogenic carcinoma. Postobstructive right upper lobe collapse noted.  Hypermetabolic mediastinal and bilateral hilar lymphadenopathy, consistent metastatic disease.  Small less than 1 cm hypermetabolic upper abdominal lymph node at the level of the celiac axis. Lymph node metastasis cannot be excluded.  Right lower lobe patchy airspace disease shows hypermetabolic activity, and is likely inflammatory or infectious etiology.  Hypermetabolic foci in right and left thyroid lobes, suspicious for small hypermetabolic thyroid nodules. Thyroid ultrasound recommended for further evaluation.   Electronically Signed   By: Earle Gell M.D.   On: 08/14/2014 16:34     Imaging Review No results found.   EKG Interpretation None      MDM   Final diagnoses:  Shortness of breath    1. HCAP 2. Bronchogenic carcinoma  He is resting comfortably and in in NAD. O2 saturations 93% on 4L O2 via [place]. CXR shows worsening lung collapse, now involving RML as well as evidence of PNA in RLL. Vanc and cefepime started.   Discussed with [facility] who accepts for admission.     Dewaine Oats, PA-C 09/01/14 [place], MD 09/01/14 [phone]

## 2014-09-01 NOTE — ED Notes (Signed)
hospitalist at bedside

## 2014-09-01 NOTE — ED Notes (Signed)
Per EMS: Pt's family told EMS that pt has lung cancer but they don't want him to know "because he worries a lot." Pt has been complaining of increased weakness over the past 24 hours. About 2 hours ago pt woke from sleep d/t SOB. Lung sound clear. Oxygen 90% on RA, up to 99% on 4 L [place]. Denies any pain.

## 2014-09-01 NOTE — ED Notes (Signed)
Pt. On cardiac monitor. 

## 2014-09-01 NOTE — ED Notes (Signed)
Pt alert and oriented x4. Respirations even and unlabored, bilateral symmetrical rise and fall of chest. Skin warm and dry. In no acute distress. Denies needs.   

## 2014-09-01 NOTE — ED Notes (Signed)
Pt called rn back into room to report he "had a crick in his neck". Pt reminded he just received tylenol.

## 2014-09-01 NOTE — H&P (Signed)
History and Physical  Stephen Ellis TMH:[id number] DOB: 1945-10-17 DOA: 09/01/2014   PCP: Elizabeth Palau, MD   Chief Complaint: dyspnea  HPI:  68 year old male with a history of hypertension, recently diagnosed stage IIIB/4 lung cancer (Dr. Julien Nordmann) presents with one-week history of worsening shortness of breath. The patient denies any fevers, chills, coughing, hemoptysis, nausea, vomiting, diarrhea, abdominal pain. His biggest complaint is worsening dyspnea and dyspnea on exertion. He is due to start a course of chemotherapy on 09/08/2014.  He was recently discharged from the hospital on 08/09/2014 after being treated for postobstructive pneumonia. He had a recent CT antrum of the chest on 07/16/2014 which was negative for pulmonary embolus, but that CT suggested a right upper lobe mass. The patient was seen by Dr. Melvyn Novas and underwent a video flexible fiberoptic bronchoscopy on 07/29/2014. The final pathology was not conclusive for malignancy. On 08/12/2014 The patient was seen by Dr. Alva Garnet and he underwent repeat bronchoscopy which showed subtotal obstruction of the right upper lobe bronchus with mucosal infiltration involving the distal trachea and main carina. Transbronchial biopsies as well as brushings and endobronchial biopsies were performed.  The final pathology (Accession: [id number]) showed minute fragments of highly atypical epithelioid cells suspicious for malignancy.  PET scan was performed on 08/14/2014 and it showed Hypermetabolic mass in the central right upper lobe and involving the right hilum, consistent with primary bronchogenic carcinoma. Postobstructive right upper lobe collapse noted.   he quit smoking 1 year ago after approximately 50 pack years. In emergency department, the patient received vancomycin and cefepime. He was afebrile and hemodynamically stable. Oxygen saturation was 95% on 2 L.  Assessment/Plan: Acute respiratory failure -Likely due to lung collapse  as well as obstructive pneumonia -Supplement oxygen -consult pulmonary as I am concerned that the actual obstruction and lung collapse may be playing a role in his dyspnea -check Procalcitonin Obstructive pneumonia/HCAP -Start vancomycin and cefepime  -Patient was discharged with 5 days of Augmentin after his last hospitalization  -Aerosolized albuterol and Atrovent  -IVF stage IIIB/4 lung cancer -Patient has been placed on Dr. Worthy Flank consult list -Patient was due to start chemotherapy on 09/08/2014 Hypertension -hold amlodipine due to soft BP Anemia -Hgb stable -baseline Hgb~12        Past Medical History  Diagnosis Date  . Syncope   . Dehydration   . Blind left eye   . Hypertension   . Anemia     "low Blood"  . Dementia     poor memory  . Cancer     Lung   Past Surgical History  Procedure Laterality Date  . No past surgeries    . Video bronchoscopy Bilateral 07/29/2014    Procedure: VIDEO BRONCHOSCOPY WITHOUT FLUORO;  Surgeon: Tanda Rockers, MD;  Location: [facility] ENDOSCOPY;  Service: Cardiopulmonary;  Laterality: Bilateral;  . Video bronchoscopy Bilateral 08/12/2014    Procedure: VIDEO BRONCHOSCOPY WITHOUT FLUORO;  Surgeon: Wilhelmina Mcardle, MD;  Location[facility] ENDOSCOPY;  Service: Cardiopulmonary;  Laterality: Bilateral;   Social History:  reports that he quit smoking about 2 months ago. He has never used smokeless tobacco. He reports that he does not drink alcohol or use illicit drugs.   Family History unobtainable secondary to cognitive impairment  No Known Allergies    Prior to Admission medications   Medication Sig Start Date End Date Taking? Authorizing Provider  acetaminophen-codeine (TYLENOL #3) 300-30 MG per tablet Take 1 tablet by mouth every 4 (four) hours as needed  for moderate pain. q 4h prn 07/25/14  Yes Historical Provider, MD  amLODipine (NORVASC) 10 MG tablet Take 10 mg by mouth daily.   Yes Historical Provider, MD  amoxicillin-clavulanate  (AUGMENTIN) 875-125 MG per tablet Take 1 tablet by mouth 2 (two) times daily.  08/19/14  Yes Historical Provider, MD  budesonide-formoterol (SYMBICORT) 160-4.5 MCG/ACT inhaler Inhale 2 puffs into the lungs 2 (two) times daily. 08/09/14  Yes Thurnell Lose, MD  ipratropium-albuterol (DUONEB) 0.5-2.5 (3) MG/3ML SOLN Take 3 mLs by nebulization every 6 (six) hours. 08/09/14  Yes Thurnell Lose, MD    Review of Systems:  Constitutional:  No weight loss, night sweats, Fevers, chills, fatigue.  Head&Eyes: No headache. Pt is blind ENT:  No Difficulty swallowing,Tooth/dental problems,Sore throat,   Cardio-vascular:  No chest pain, Orthopnea, PND, swelling in lower extremities,  dizziness, palpitations  GI:  No  abdominal pain, nausea, vomiting, diarrhea, loss of appetite, hematochezia, melena Resp:  No coughing up of blood .No wheezing.No chest wall deformity  Skin:  no rash or lesions.  GU:  no dysuria, change in color of urine, no urgency or frequency. No flank pain.  Musculoskeletal:  No joint pain or swelling. No decreased range of motion. No back pain.  Psych:  No change in mood or affect. No depression or anxiety. Neurologic: No headache, no dysesthesia, no focal weakness, no vision loss. No syncope  Physical Exam: Filed Vitals:   09/01/14 0615 09/01/14 0648 09/01/14 0656 09/01/14 0715  BP:  113/73  117/85  Pulse: 100 104  97  Temp:   98.8 F (37.1 C) 98.2 F (36.8 C)  TempSrc:   Oral Oral  Resp: 33 23  33  SpO2: 96% 95%  96%   General:  Awake and alert, NAD, nontoxic, pleasant/cooperative Head/Eye: No conjunctival hemorrhage, no icterus, /AT, No nystagmus ENT:  No icterus,  No thrush, good dentition, no pharyngeal exudate Neck:  No masses, no lymphadenpathy, no bruits CV:  RRR, no rub, no gallop, no S3 Lung:  L-CTA, R-basilar crackles, diminished breath sounds Abdomen: soft/NT, +BS, nondistended, no peritoneal signs Ext: No cyanosis, No rashes, No petechiae, No  lymphangitis, No edema  Labs on Admission:  Basic Metabolic Panel:  Recent Labs Lab 08/25/14 1128 09/01/14 0433  NA 138 134*  K 4.0 3.5  CL  --  103  CO2 25 25  GLUCOSE 94 109*  BUN 17.0 14  CREATININE 1.2 1.05  CALCIUM 9.9 9.2   Liver Function Tests:  Recent Labs Lab 08/25/14 1128  AST 22  ALT 32  ALKPHOS 65  BILITOT 0.42  PROT 9.5*  ALBUMIN 2.3*   No results for input(s): LIPASE, AMYLASE in the last 168 hours. No results for input(s): AMMONIA in the last 168 hours. CBC:  Recent Labs Lab 08/25/14 1127 09/01/14 0433  WBC 5.1 5.4  NEUTROABS 3.2 4.4  HGB 11.3* 11.7*  HCT 34.3* 34.7*  MCV 88.5 87.6  PLT 260 230   Cardiac Enzymes: No results for input(s): CKTOTAL, CKMB, CKMBINDEX, TROPONINI in the last 168 hours. BNP: Invalid input(s): POCBNP CBG: No results for input(s): GLUCAP in the last 168 hours.  Radiological Exams on Admission: Dg Chest 2 View  09/01/2014   CLINICAL DATA:  Shortness of breath  EXAM: CHEST  2 VIEW  COMPARISON:  08/07/2014  FINDINGS: Progressive collapse of the right lung, completely involving the right upper and middle lobes. There is opacity within the aerated right lower lobe, compatible with postobstructive changes. A known right  perihilar mass is difficult to visualize due to the extensive parenchymal opacity. The left lung is well aerated. Heart size is normal.  IMPRESSION: Lung cancer with progressive right lung collapse, now involving the right upper and middle lobes. There is postobstructive pneumonia or pneumonitis in the right lower lobe.   Electronically Signed   By: Jorje Guild M.D.   On: 09/01/2014 04:19    EKG: Independently reviewed. Sinus rhythm, no ST-T wave change    Time spent:60 minutes Code Status:   FULL Family Communication:  No Family at bedside   Caniya Tagle, DO  [facility] Pager [phone]  If 7PM-7AM, please contact night-coverage [email or web address] Password [facility] 09/01/2014, 7:54  AM

## 2014-09-02 ENCOUNTER — Encounter: Payer: Self-pay | Admitting: Radiation Oncology

## 2014-09-02 ENCOUNTER — Other Ambulatory Visit: Payer: Medicare Other

## 2014-09-02 DIAGNOSIS — R918 Other nonspecific abnormal finding of lung field: Secondary | ICD-10-CM

## 2014-09-02 DIAGNOSIS — J9819 Other pulmonary collapse: Secondary | ICD-10-CM

## 2014-09-02 DIAGNOSIS — R0600 Dyspnea, unspecified: Secondary | ICD-10-CM

## 2014-09-02 DIAGNOSIS — J188 Other pneumonia, unspecified organism: Secondary | ICD-10-CM

## 2014-09-02 DIAGNOSIS — E8809 Other disorders of plasma-protein metabolism, not elsewhere classified: Secondary | ICD-10-CM

## 2014-09-02 LAB — BASIC METABOLIC PANEL
Anion gap: 8 (ref 5–15)
BUN: 14 mg/dL (ref 6–23)
CO2: 26 mmol/L (ref 19–32)
CREATININE: 1.1 mg/dL (ref 0.50–1.35)
Calcium: 9.1 mg/dL (ref 8.4–10.5)
Chloride: 102 mEq/L (ref 96–112)
GFR, EST AFRICAN AMERICAN: 79 mL/min — AB (ref >90)
GFR, EST NON AFRICAN AMERICAN: 68 mL/min — AB (ref >90)
Glucose, Bld: 98 mg/dL (ref 70–99)
POTASSIUM: 3.3 mmol/L — AB (ref 3.5–5.1)
Sodium: 136 mmol/L (ref 135–145)

## 2014-09-02 LAB — CBC
HCT: 35.3 % — ABNORMAL LOW (ref 39.0–52.0)
Hemoglobin: 11.8 g/dL — ABNORMAL LOW (ref 13.0–17.0)
MCH: 29.4 pg (ref 26.0–34.0)
MCHC: 33.4 g/dL (ref 30.0–36.0)
MCV: 87.8 fL (ref 78.0–100.0)
PLATELETS: 226 10*3/uL (ref 150–400)
RBC: 4.02 MIL/uL — ABNORMAL LOW (ref 4.22–5.81)
RDW: 13 % (ref 11.5–15.5)
WBC: 3.5 10*3/uL — ABNORMAL LOW (ref 4.0–10.5)

## 2014-09-02 MED ORDER — IPRATROPIUM-ALBUTEROL 0.5-2.5 (3) MG/3ML IN SOLN
3.0000 mL | Freq: Three times a day (TID) | RESPIRATORY_TRACT | Status: DC
  Administered 2014-09-03 – 2014-09-07 (×13): 3 mL via RESPIRATORY_TRACT
  Filled 2014-09-02 (×13): qty 3

## 2014-09-02 MED ORDER — IPRATROPIUM-ALBUTEROL 0.5-2.5 (3) MG/3ML IN SOLN
3.0000 mL | Freq: Four times a day (QID) | RESPIRATORY_TRACT | Status: DC
  Administered 2014-09-02 (×4): 3 mL via RESPIRATORY_TRACT
  Filled 2014-09-02 (×3): qty 3

## 2014-09-02 NOTE — Progress Notes (Addendum)
Patient ID: Stephen Ellis, male   DOB: September 24, 1945, 68 y.o.   MRN: [id number] [facility] PROGRESS NOTE  Stephen Ellis VOZ:[id number] DOB: 1946/06/24 DOA: 09/01/2014 PCP: Elizabeth Palau, MD  Brief narrative: 68 year old male with a history of hypertension, recently diagnosed stage IIIB/4 lung cancer (Dr. Julien Nordmann) presented with one-week history of worsening shortness of breath. The patient denies any fevers, chills, coughing, hemoptysis, nausea, vomiting, diarrhea, abdominal pain. His biggest complaint is worsening dyspnea and dyspnea on exertion. He is due to start a course of chemotherapy on 09/08/2014. He was recently discharged from the hospital on 08/09/2014 after being treated for postobstructive pneumonia. He had a recent CT angio of the chest on 07/16/2014 which was negative for pulmonary embolus, but that CT suggested a right upper lobe mass. The patient was seen by Dr. Melvyn Novas and underwent a video flexible fiberoptic bronchoscopy on 07/29/2014. The final pathology was not conclusive for malignancy. On 08/12/2014 The patient was seen by Dr. Alva Garnet and he underwent repeat bronchoscopy which showed subtotal obstruction of the right upper lobe bronchus with mucosal infiltration involving the distal trachea and main carina. Transbronchial biopsies as well as brushings and endobronchial biopsies were performed.   The final pathology (Accession: [id number]) showed minute fragments of highly atypical epithelioid cells suspicious for malignancy. PET scan was performed on 08/14/2014 and it showed Hypermetabolic mass in the central right upper lobe and involving the right hilum, consistent with primary bronchogenic carcinoma. Postobstructive right upper lobe collapse noted. he quit smoking 1 year ago after approximately 50 pack years. In emergency department, the patient received vancomycin and cefepime. He was afebrile and hemodynamically stable. Oxygen saturation was 95% on 2 L.    Assessment/Plan: Acute respiratory failure secondary to obstructive PNA in the setting of lung cancer  - No indication for additional bronchoscopy at this time per PCCM  - continue current ABX and narrow down as clinically indicated and if PCT trending down (recommended per PCCM) - Titrate O2 for sat of 88-92%. - Ambulate as able. Obstructive pneumonia/HCAP - continue vancomycin and cefepime day #2 - continue aerosolized albuterol and Atrovent  Stage IIIB/4 lung cancer - Patient has been placed on Dr. Worthy Flank consult list - Patient was due to start chemotherapy on 09/08/2014 Hypertension - amlodipine held due to soft BP, possibly be able to resume in AM Anemia of chronic disease, malignancy  - Hgb stable - baseline Hgb~12 Hypokalemia - mild, will supplement and repeat BMP in AM  DVT prophylaxis  Lovenox SQ while pt is in hospital  Code Status: Full Family Communication: Pt at bedside Disposition Plan: Home when medically stable   IV Access:   Peripheral IV Procedures and diagnostic studies:   CXR  09/01/2014  Lung cancer, progressive right lung collapse, involving RUL and RML, postobstructive PNA/pneumonitis RLL.    Medical Consultants:   PCCM --> signed off 09/02/2014  Other Consultants:   Physical therapy  Anti-Infectives:   Vancomycin 12/28 --> Maxipime 12/28 -->   Faye Ramsay, MD  [facility] Pager [phone]  If 7PM-7AM, please contact night-coverage [email or web address] Password [email or web address] 09/02/2014, 12:16 PM   LOS: 1 day   HPI/Subjective: No events overnight.   Objective: Filed Vitals:   09/01/14 2039 09/01/14 2117 09/02/14 0509 09/02/14 0806  BP:  115/78 121/95   Pulse:  78 94   Temp:  98.5 F (36.9 C) 98.4 F (36.9 C)   TempSrc:  Oral Oral   Resp:  22 18   Height:      Weight:  SpO2: 95% 99% 97% 95%    Intake/Output Summary (Last 24 hours) at 09/02/14 1216 Last data filed at 09/01/14 2021  Gross per 24 hour  Intake    240 ml  Output     150 ml  Net     90 ml    Exam:   General:  Pt is alert, not in acute distress  Cardiovascular: Regular rate and rhythm, S1/S2, no murmurs, no rubs, no gallops  Respiratory: Clear to auscultation bilaterally, diminished breath sounds R>L side, mild exp wheezing   Abdomen: Soft, non tender, non distended, bowel sounds present, no guarding  Extremities: pulses DP and PT palpable bilaterally  Neuro: Grossly nonfocal  Data Reviewed: Basic Metabolic Panel:  Recent Labs Lab 09/01/14 0433 09/02/14 0501  NA 134* 136  K 3.5 3.3*  CL 103 102  CO2 25 26  GLUCOSE 109* 98  BUN 14 14  CREATININE 1.05 1.10  CALCIUM 9.2 9.1   CBC:  Recent Labs Lab 09/01/14 0433 09/02/14 0501  WBC 5.4 3.5*  NEUTROABS 4.4  --   HGB 11.7* 11.8*  HCT 34.7* 35.3*  MCV 87.6 87.8  PLT 230 226    Scheduled Meds: . ceFEPime  IV  1 g Intravenous 3 times per day  . enoxaparin injection  40 mg Subcutaneous Q24H  . ipratropium-albuterol  3 mL Nebulization Q6H  . vancomycin  1,000 mg Intravenous Q12H   Continuous Infusions:

## 2014-09-02 NOTE — Consult Note (Signed)
Name: Stephen Ellis MRN: [id number] DOB: 1946/06/16    ADMISSION DATE:  09/01/2014 CONSULTATION DATE:  09/01/2014  REFERRING MD :  Tat  CHIEF COMPLAINT:  Lung cancer and post obstructive pneumonia.  BRIEF PATIENT DESCRIPTION: 68 year old male with recent diagnosis of stage IIIB/IV lung cancer by bronchoscopy earlier this month.  The patient apparently was not told of the diagnosis per family's request.  PET scan positive only for the RUL with some hilar involvement.  Returns to the ED with SOB and DOE.  CXR showed RUL collapse and ?of post obstructive PNA and PCCM was asked to see patient on consultation for above.  Patient denied any fever, chills or purulent sputum production.  Denied hemptysis.  SIGNIFICANT EVENTS  12/28 to ED for SOB.  STUDIES:  12/28 CXR with RUL collapse.  PAST MEDICAL HISTORY :   has a past medical history of Syncope; Dehydration; Blind left eye; Hypertension; Anemia; Dementia; and Cancer.  has past surgical history that includes No past surgeries; Video bronchoscopy (Bilateral, 07/29/2014); and Video bronchoscopy (Bilateral, 08/12/2014). Prior to Admission medications   Medication Sig Start Date End Date Taking? Authorizing Provider  acetaminophen-codeine (TYLENOL #3) 300-30 MG per tablet Take 1 tablet by mouth every 4 (four) hours as needed for moderate pain. q 4h prn 07/25/14  Yes Historical Provider, MD  amLODipine (NORVASC) 10 MG tablet Take 10 mg by mouth daily.   Yes Historical Provider, MD  amoxicillin-clavulanate (AUGMENTIN) 875-125 MG per tablet Take 1 tablet by mouth 2 (two) times daily.  08/19/14  Yes Historical Provider, MD  budesonide-formoterol (SYMBICORT) 160-4.5 MCG/ACT inhaler Inhale 2 puffs into the lungs 2 (two) times daily. 08/09/14  Yes Thurnell Lose, MD  ipratropium-albuterol (DUONEB) 0.5-2.5 (3) MG/3ML SOLN Take 3 mLs by nebulization every 6 (six) hours. 08/09/14  Yes Thurnell Lose, MD   No Known Allergies  FAMILY HISTORY:  family  history is not on file. SOCIAL HISTORY:  reports that he quit smoking about 2 months ago. He has never used smokeless tobacco. He reports that he does not drink alcohol or use illicit drugs.  REVIEW OF SYSTEMS:   Constitutional: Negative for fever, chills, weight loss, malaise/fatigue and diaphoresis.  HENT: Negative for hearing loss, ear pain, nosebleeds, congestion, sore throat, neck pain, tinnitus and ear discharge.   Eyes: Negative for blurred vision, double vision, photophobia, pain, discharge and redness.  Respiratory: Negative for cough, hemoptysis, sputum production, shortness of breath, wheezing and stridor.   Cardiovascular: Negative for chest pain, palpitations, orthopnea, claudication, leg swelling and PND.  Gastrointestinal: Negative for heartburn, nausea, vomiting, abdominal pain, diarrhea, constipation, blood in stool and melena.  Genitourinary: Negative for dysuria, urgency, frequency, hematuria and flank pain.  Musculoskeletal: Negative for myalgias, back pain, joint pain and falls.  Skin: Negative for itching and rash.  Neurological: Negative for dizziness, tingling, tremors, sensory change, speech change, focal weakness, seizures, loss of consciousness, weakness and headaches.  Endo/Heme/Allergies: Negative for environmental allergies and polydipsia. Does not bruise/bleed easily.  SUBJECTIVE:   VITAL SIGNS: Temp:  [98.1 F (36.7 C)-98.5 F (36.9 C)] 98.4 F (36.9 C) (12/29 0509) Pulse Rate:  [78-94] 94 (12/29 0509) Resp:  [18-42] 18 (12/29 0509) BP: (115-132)/(78-95) 121/95 mmHg (12/29 0509) SpO2:  [88 %-99 %] 95 % (12/29 0806) Weight:  [76.204 kg (168 lb)] 76.204 kg (168 lb) (12/28 1643)  PHYSICAL EXAMINATION: General:  Chronically ill appearing male, NAD on 2L [place]. Neuro:  Awake but confused at times, oriented to self  and place.  Moves all ext to command. HEENT:  [place]/AT, PERRL, EOM-I and MMM. Cardiovascular:  RRR, Nl S1/S2, -M/R/G. Lungs:  Mild end exp wheezing.    Abdomen:  Soft, NT, ND and +BS. Musculoskeletal:  -edema and -tenderness. Skin:  Intact.   Recent Labs Lab 09/01/14 0433 09/02/14 0501  NA 134* 136  K 3.5 3.3*  CL 103 102  CO2 25 26  BUN 14 14  CREATININE 1.05 1.10  GLUCOSE 109* 98    Recent Labs Lab 09/01/14 0433 09/02/14 0501  HGB 11.7* 11.8*  HCT 34.7* 35.3*  WBC 5.4 3.5*  PLT 230 226   Dg Chest 2 View  09/01/2014   CLINICAL DATA:  Shortness of breath  EXAM: CHEST  2 VIEW  COMPARISON:  08/07/2014  FINDINGS: Progressive collapse of the right lung, completely involving the right upper and middle lobes. There is opacity within the aerated right lower lobe, compatible with postobstructive changes. A known right perihilar mass is difficult to visualize due to the extensive parenchymal opacity. The left lung is well aerated. Heart size is normal.  IMPRESSION: Lung cancer with progressive right lung collapse, now involving the right upper and middle lobes. There is postobstructive pneumonia or pneumonitis in the right lower lobe.   Electronically Signed   By: Jorje Guild M.D.   On: 09/01/2014 04:19    ASSESSMENT / PLAN:  68 year old male 50 pack year history of smoking, recent diagnosis of advanced stage lung cancer presenting with dyspnea.  WBC is stable and patient has not B type symptoms making an acute infection less than likely.  The SOB is very subjective and patient does not have any evidence to support a true pneumonia.  Started on abx per primary.  Cultures sent.  H/O consulted.  Plan: - No indication for additional bronchoscopy at this time. - Abx as ordered by primary.  Agree with choices. - PCT 0.55, recommend continuation of abx for now, if f/u is normalizing then would deescalate. - H/O consult to address cancer recommended. - A possibility would be targeted radiation therapy vs stenting but would recommend referral to a tertiary center for that. - Titrate O2 for sat of 88-92%. - Ambulate as able. - PCCM  will sign off, please call back if needed.  Rush Farmer, M.D. [facility] Pulmonary/Critical Care Medicine. Pager: [phone]. After hours pager: [phone].  09/02/2014, 10:43 AM

## 2014-09-02 NOTE — Progress Notes (Signed)
Subjective: The patient is seen and examined today. 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 The patient is currently under evaluation and consideration of a course of concurrent chemoradiation as scheduled to start next week. He was admitted to the hospital yesterday complaining of worsening dyspnea over 1 week duration. Chest x-ray performed yesterday showed progressive right lung collapse now involving the right upper and middle lobes with postobstructive pneumonia and the right lower lobe. The patient was started on treatment with IV antibiotics with cefepime and vancomycin and feeling much better today. He denied having any significant fever or chills. He has no nausea or vomiting.  Objective: Vital signs in last 24 hours: Temp:  [98.4 F (36.9 C)-98.9 F (37.2 C)] 98.9 F (37.2 C) (12/29 1400) Pulse Rate:  [78-94] 92 (12/29 1400) Resp:  [18-22] 20 (12/29 1400) BP: (115-121)/(78-95) 119/88 mmHg (12/29 1400) SpO2:  [95 %-99 %] 96 % (12/29 1428)  Intake/Output from previous day: 12/28 0701 - 12/29 0700 In: 240 [P.O.:240] Out: 375 [Urine:375] Intake/Output this shift: Total I/O In: 240 [P.O.:240] Out: -   General appearance: alert, cooperative, fatigued and no distress Resp: rales RLL Cardio: regular rate and rhythm, S1, S2 normal, no murmur, click, rub or gallop GI: soft, non-tender; bowel sounds normal; no masses,  no organomegaly Extremities: extremities normal, atraumatic, no cyanosis or edema  Lab Results:   Recent Labs  09/01/14 0433 09/02/14 0501  WBC 5.4 3.5*  HGB 11.7* 11.8*  HCT 34.7* 35.3*  PLT 230 226   BMET  Recent Labs  09/01/14 0433 09/02/14 0501  NA 134* 136   K 3.5 3.3*  CL 103 102  CO2 25 26  GLUCOSE 109* 98  BUN 14 14  CREATININE 1.05 1.10  CALCIUM 9.2 9.1    Studies/Results: Dg Chest 2 View  09/01/2014   CLINICAL DATA:  Shortness of breath  EXAM: CHEST  2 VIEW  COMPARISON:  08/07/2014  FINDINGS: Progressive collapse of the right lung, completely involving the right upper and middle lobes. There is opacity within the aerated right lower lobe, compatible with postobstructive changes. A known right perihilar mass is difficult to visualize due to the extensive parenchymal opacity. The left lung is well aerated. Heart size is normal.  IMPRESSION: Lung cancer with progressive right lung collapse, now involving the right upper and middle lobes. There is postobstructive pneumonia or pneumonitis in the right lower lobe.   Electronically Signed   By: Jorje Guild M.D.   On: 09/01/2014 04:19    Medications: I have reviewed the patient's current medications.   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 He is scheduled to start chemotherapy next week but his appointment with the radiation oncologist is delayed until next week. I may consider consulting Dr. Lisbeth Renshaw to see the patient while he is in the hospital to start his treatment sooner. He would also need MRI of the brain to complete the staging workup. It is scheduled to be done outpatient basis next week. 2) right lower lobe pneumonia: Continue current treatment with cefepime and vancomycin The patient is  feeling a little bit better today. 3) Plasma cell dyscrasia: Currently on observation. Recent myeloma panel showed no evidence for disease progression. Thank you for taking good care of Mr. Lauderback, I will continue to follow up the patient with you and assist in his management on as-needed basis.  LOS: 1 day      Emmajean Ratledge K. 09/02/2014

## 2014-09-02 NOTE — Progress Notes (Signed)
Thoracic Location of Tumor / Histology:   Right upper Lung mass ( Stage III B/4 )  Patient presented  months ago with symptoms of: worsening shortness  breath  1 week,  And shortness breath on exertion, hospitalized had postobstructive pneumonia  D/ C hospital 08/09/14  Biopsies of   revealed: Diagnosis 08/12/14:repeat Bronchoscopy  BRONCHIAL WASHING (SPECIMEN 2 OF 3, COLLECTED ON 08/12/14):ATYPICAL CELLS PRESENT , SUSPICIOUS FOR MALIGNANCY SEE COMMENT COMMENT: THERE IS NO CELL BLOCK AVAILABLE TO FURTHER CHARACTERIZE THE ATYPICALCELLS. PLEASE SEE THE CORRESPONDING SURGICAL PATHOLOGY REPORT. Diagnosis: Lung, biopsy, right upper lobe 08/12/14: - MINUTE FRAGMENTS OF HIGHLY ATYPICAL EPITHELIOID CELLS, SUSPICIOUS FOR MALIGNANCY, SEE COMMENT. Dr. Merton Border, MD. Microscopic Comment The majority of the biopsy demonstrates benign lung with abundant mixed acute and chronic inflammation.There are a few fragmented and detached clusters of highly atypical basaloid cells that express cytokeratinAE1/AE3 and CD56; and are negative for chromogranin, cytokeratin 5/6, and TTF-1. Although the overallmorphologic and immunophenotypic features are quite concerning for malignant neoplasm withneuroendocrine differentiation, the quantity of diagnostic tissue present prohibits definitive characterization  Tobacco/Marijuana/Snuff/ETOH use: 38 years 1ppd cigarettes,quit 06/24/14,no smokeless tobacco, no alcohol or illict drug use  Past/Anticipated interventions by cardiothoracic surgery, if any: Dr.  Christinia Gully , MD;  Video fiberoptic bronchoscopy 07/29/14 biopsy not conclusive for malignancy  Past/Anticipated interventions by medical oncology, if any: Dr. Julien Nordmann 08/25/14, Chemo Education 09/02/14, 1st  Weekly Taxol/Carbo infusion 09/09/14, A. Johnson,NP visit, 09/12/14 MRI Brain scheduled,   Signs/Symptoms  Weight changes, if any: loss 15 lbs recently  Respiratory complaints, if any:SOB with exertion,occasional  productive   Hemoptysis, if EOF:HQRF tinge sputum  Pain issues, if any:  SAFETY ISSUES:  Prior radiation? NO  Pacemaker/ICD? NO  Is the patient on methotrexate? NO  Current Complaints / other details:  Pet scan 08/14/14, Blind left eye, Dementia, Syncope,HTN,plasma cell dyscrasia dx 2013,failed to follow up for visitsm, Allergies: NKDA

## 2014-09-03 ENCOUNTER — Ambulatory Visit
Admit: 2014-09-03 | Discharge: 2014-09-03 | Disposition: A | Discharge status: Home / Self Care | Payer: Medicare Other | Attending: Radiation Oncology | Admitting: Radiation Oncology

## 2014-09-03 LAB — VANCOMYCIN, TROUGH: Vancomycin Tr: 14.3 ug/mL (ref 10.0–20.0)

## 2014-09-03 LAB — BASIC METABOLIC PANEL
ANION GAP: 6 (ref 5–15)
BUN: 12 mg/dL (ref 6–23)
CALCIUM: 9.1 mg/dL (ref 8.4–10.5)
CO2: 23 mmol/L (ref 19–32)
Chloride: 102 mEq/L (ref 96–112)
Creatinine, Ser: 1.07 mg/dL (ref 0.50–1.35)
GFR calc Af Amer: 82 mL/min — ABNORMAL LOW (ref >90)
GFR, EST NON AFRICAN AMERICAN: 70 mL/min — AB (ref >90)
Glucose, Bld: 92 mg/dL (ref 70–99)
Potassium: 3.2 mmol/L — ABNORMAL LOW (ref 3.5–5.1)
SODIUM: 131 mmol/L — AB (ref 135–145)

## 2014-09-03 LAB — CBC
HCT: 36.6 % — ABNORMAL LOW (ref 39.0–52.0)
Hemoglobin: 12.5 g/dL — ABNORMAL LOW (ref 13.0–17.0)
MCH: 29.8 pg (ref 26.0–34.0)
MCHC: 34.2 g/dL (ref 30.0–36.0)
MCV: 87.4 fL (ref 78.0–100.0)
PLATELETS: 232 10*3/uL (ref 150–400)
RBC: 4.19 MIL/uL — ABNORMAL LOW (ref 4.22–5.81)
RDW: 13.2 % (ref 11.5–15.5)
WBC: 3.8 10*3/uL — ABNORMAL LOW (ref 4.0–10.5)

## 2014-09-03 LAB — PROCALCITONIN: PROCALCITONIN: 0.61 ng/mL

## 2014-09-03 MED ORDER — POTASSIUM CHLORIDE CRYS ER 20 MEQ PO TBCR
40.0000 meq | EXTENDED_RELEASE_TABLET | Freq: Once | ORAL | Status: AC
  Administered 2014-09-03: 40 meq via ORAL
  Filled 2014-09-03: qty 2

## 2014-09-03 NOTE — Progress Notes (Addendum)
Patient ID: Stephen Ellis, male   DOB: [birth year], 68 y.o.   MRN: [id number] [facility] PROGRESS NOTE  Stephen Ellis OYD:[id number] DOB: 12-08-1945 DOA: 09/01/2014 PCP: Elizabeth Palau, MD  Brief narrative:    68 year old male with a history of hypertension, recently diagnosed stage IIIB/4 lung cancer (Dr. Julien Nordmann) presented with one-week history of worsening shortness of breath at rest and with exertion. Patient was recently hospitalized for treatment of postobstructive pneumonia. He is a recent CT angiogram of the chest on 07/16/2014 was negative for pulmonary embolism but it suggested a right upper lung lobe mass. Patient underwent video flexible fiberoptic bronchoscopy on 07/29/2014 with final pathology not conclusive for malignancy. On 08/12/2014 patient was seen by Dr. Alva Garnet and underwent repeat bronchoscopy which showed subtotal obstruction of the right upper lobe bronchus with mucosal infiltration involving the distal trachea and main carina. Transbronchial biopsies as well as brushings and endobronchial biopsies were performed. The final pathology (Accession: [id number]) showed minute fragments of highly atypical epithelioid cells suspicious for malignancy. PET scan was performed on 08/14/2014 and it showed hypermetabolic mass in the central right upper lobe and involving the right hilum, consistent with primary bronchogenic carcinoma. Postobstructive right upper lobe collapse noted. Patient quit smoking 1 year ago after approximately 50 pack years. In emergency department, the patient received vancomycin and cefepime for treatment of obstructive pneumonia. He was afebrile and hemodynamically stable. Oxygen saturation was 95% on 2 L.   Assessment/Plan:    Principal problem: Acute respiratory failure with hypoxia secondary to obstructive PNA in the setting of lung cancer   Patient underwent last bronchoscopy on 08/12/2014. The final pathology results suspicious for  malignancy. PET scan further performed on 08/14/2014 - findings consistent with primary bronchogenic carcinoma.  Patient was admitted with hypoxic respiratory failure thought to be secondary to obstructive pneumonia in the setting of primary bronchogenic carcinoma.  Continue treatment with vancomycin and cefepime for now.  Continue oxygen support via nasal cannula to keep oxygen saturation above 90%.  Continue nebulizer treatment, DuoNeb 3 times daily.  Active problems: Obstructive pneumonia/HCAP  Patient was started on vancomycin and cefepime on the admission. Continue current treatment.  Continue nebulizer treatment  Stage IIIB/4 lung cancer  Appreciate Dr. Julien Nordmann seen the patient. Patient is due to start chemotherapy on 09/08/2014.  Hypertension  Antihypertensive medication on hold due to soft blood pressure.  Anemia of chronic disease, malignancy   Hemoglobin is stable at 12.5.  No current indications for transfusion  Hypokalemia  Perhaps because of nebulizer treatments. Supplemented.  Follow-up BMP in the morning.   DVT Prophylaxis   Lovenox subcutaneous ordered  Code Status: Full.  Family Communication:  plan of care discussed with the patient Disposition Plan: Home when stable.   IV access:  Peripheral IV  Procedures and diagnostic studies:    Dg Chest 2 View 09/01/2014    Lung cancer with progressive right lung collapse, now involving the right upper and middle lobes. There is postobstructive pneumonia or pneumonitis in the right lower lobe.    Medical Consultants:  PCCM --> signed off 09/02/2014  Oncology, Dr. Earlie Server  Other Consultants:  Physical therapy   IAnti-Infectives:   Vancomycin 12/28 --> Maxipime 12/28 -->   Leisa Lenz, MD  [facility] Pager [phone]  If 7PM-7AM, please contact night-coverage [email or web address] Password [facility] 09/03/2014, 8:47 AM   LOS: 2 days    HPI/Subjective: No acute overnight  events.  Objective: Filed Vitals:   09/02/14 1936 09/02/14 2100 09/03/14 9476 09/03/14 5465  BP:  116/95 122/97   Pulse:  94 92   Temp:  98.3 F (36.8 C) 98.7 F (37.1 C)   TempSrc:  Oral Oral   Resp:  18 16   Height:      Weight:      SpO2: 94% 99% 98% 96%    Intake/Output Summary (Last 24 hours) at 09/03/14 0847 Last data filed at 09/03/14 0620  Gross per 24 hour  Intake    240 ml  Output    250 ml  Net    -10 ml    Exam:   General:  Pt is alert, not in acute distress  Cardiovascular: Regular rate and rhythm, S1/S2 appreciated   Respiratory: no wheezing, no rhonchi   Abdomen: Soft, non tender, non distended, bowel sounds present  Extremities: No edema, pulses DP and PT palpable bilaterally  Neuro: Grossly nonfocal  Data Reviewed: Basic Metabolic Panel:  Recent Labs Lab 09/01/14 0433 09/02/14 0501 09/03/14 0520  NA 134* 136 131*  K 3.5 3.3* 3.2*  CL 103 102 102  CO2 25 26 23   GLUCOSE 109* 98 92  BUN 14 14 12   CREATININE 1.05 1.10 1.07  CALCIUM 9.2 9.1 9.1   Liver Function Tests: No results for input(s): AST, ALT, ALKPHOS, BILITOT, PROT, ALBUMIN in the last 168 hours. No results for input(s): LIPASE, AMYLASE in the last 168 hours. No results for input(s): AMMONIA in the last 168 hours. CBC:  Recent Labs Lab 09/01/14 0433 09/02/14 0501 09/03/14 0520  WBC 5.4 3.5* 3.8*  NEUTROABS 4.4  --   --   HGB 11.7* 11.8* 12.5*  HCT 34.7* 35.3* 36.6*  MCV 87.6 87.8 87.4  PLT 230 226 232   Cardiac Enzymes: No results for input(s): CKTOTAL, CKMB, CKMBINDEX, TROPONINI in the last 168 hours. BNP: Invalid input(s): POCBNP CBG: No results for input(s): GLUCAP in the last 168 hours.  No results found for this or any previous visit (from the past 240 hour(s)).   Scheduled Meds: . ceFEPime (MAXIPIME)   1 g Intravenous 3 times per day  . enoxaparin (LOVENOX) injection  40 mg Subcutaneous Q24H  . feeding supplement (ENSURE COMPLETE)  237 mL Oral BID BM   . ipratropium-albuterol  3 mL Nebulization TID  . potassium chloride  40 mEq Oral Once  . vancomycin  1,000 mg Intravenous Q12H

## 2014-09-03 NOTE — Progress Notes (Signed)
ANTIBIOTIC CONSULT NOTE - Follow-up  Pharmacy Consult for vancomycin, cefepime Indication: HCAP  No Known Allergies  Patient Measurements: Height: 6' (182.9 cm) Weight: 168 lb (76.204 kg) IBW/kg (Calculated) : 77.6   Vital Signs: Temp: 98.7 F (37.1 C) (12/30 0620) Temp Source: Oral (12/30 0620) BP: 122/97 mmHg (12/30 0620) Pulse Rate: 92 (12/30 0620) Intake/Output from previous day: 12/29 0701 - 12/30 0700 In: 240 [P.O.:240] Out: 250 [Urine:250] Intake/Output from this shift:    Labs:  Recent Labs  09/01/14 0433 09/02/14 0501 09/03/14 0520  WBC 5.4 3.5* 3.8*  HGB 11.7* 11.8* 12.5*  PLT 230 226 232  CREATININE 1.05 1.10 1.07   Estimated Creatinine Clearance: 73.2 mL/min (by C-G formula based on Cr of 1.07).   Procalcitonin:  0.55 on 12/28   0.61 on 12/30  Recent Labs  09/03/14 0520  VANCOTROUGH 14.3      Medical History: Past Medical History  Diagnosis Date  . Syncope   . Dehydration   . Blind left eye   . Hypertension   . Anemia     "low Blood"  . Dementia     poor memory  . Cancer     Lung  . Lung cancer 08/12/14    Right lung    Medications:  Scheduled:  . ceFEPime (MAXIPIME) IV  1 g Intravenous 3 times per day  . enoxaparin (LOVENOX) injection  40 mg Subcutaneous Q24H  . feeding supplement (ENSURE COMPLETE)  237 mL Oral BID BM  . ipratropium-albuterol  3 mL Nebulization TID  . sodium chloride  3 mL Intravenous Q12H  . vancomycin  1,000 mg Intravenous Q12H   Infusions:    PRN:    Microbiology: No cultures this admission  Anti-infectives: 12/28 >> vancomycin >> 12/28 >> cefepime >>   Assessment: 68 yo male with newly-diagnosed NSCLC, chemoradiation planned to begin on 09/08/14,  presented on 08/31/14 with [facility] and weakness.. CXR showed postobstructive pneumonia or pneumonitis in the right lower lobe. Orders were received to begin vancomycin and cefepime with pharmacy dosing assistance requested.  Today, 09/03/14: D#3  vancomycin 1 gram IV q12h D#3 cefepime 1 gram IV q8h Remains afebrile WBC remains slightly low SCr stable, WNL. Procalcitonin rising Vancomycin trough just below goal range, but anticipate further accumulation on current dosage   Goal of Therapy:  Vancomycin trough 15-20 Appropriate antibiotic dosing for indication and renal function; eradication of infection.   Plan:  1. Continue vancomycin 1 gram IV q12h. 2. Continue cefepime 1 gram IV q8h. 3. Follow renal function, clinical course.  Clayburn Pert, PharmD, BCPS Pager: [phone] 09/03/2014  7:12 AM

## 2014-09-03 NOTE — Consult Note (Signed)
Radiation Oncology         [phone] ________________________________  Initial outpatient Consultation - Date: 09/01/2014   Name: Stephen Ellis MRN: [id number]   DOB: Nov 02, 1945  REFERRING PHYSICIAN: Curt Ellis  DIAGNOSIS:    ICD-9-CM ICD-10-CM   1. HCAP (healthcare-associated pneumonia) 25 J18.9   2. Shortness of breath 786.05 R06.02 DG Chest 2 View     DG Chest 2 View  3. Bronchogenic carcinoma of right lung 162.9 C34.91     STAGE: Lung cancer, upper lobe   Staging form: Lung, AJCC 7th Edition     Clinical stage from 08/25/2014: Stage IIIB (T2a, N3, M0) - Signed by Stephen Bears, MD on 08/25/2014       Staging comments: Neuroendocrine carcinoma  HISTORY OF PRESENT ILLNESS::Stephen Ellis is a 68 y.o. male  Who was recently diagnosed with lung cancer. He presented to the hospital with shortness of breath. A CT scan showed that the right lung demonstrates complete collapse of the right upper lobe with hyperexpansion of the right middle and lower lobes.Compression of the right upper lobe bronchus was noted with a collapsed and consolidated right upper lobe. At the point of arterial distortion and the area measures approximately 3.3 cm. A 2.7 x 2.4 cm area of soft tissue density is noted at the origin of the upper lobe bronchus partially distorting the main right pulmonary artery. He underwent bronchoscopy which was inconclusive. On repeat bronchoscopy 08/12/2014 subtotal obstruction of the right upper lobe bronchus with mucosal infiltration involving the distal trachea and main carina was noted. Transbronchial biopsies as well as brushings and endobronchial biopsies were performed which showed minute fragments of highly atypical epithelioid cells suspicious for malignancy. A PET scan was performed on 08/14/2014 which showed a hypermetabolic mass in the central right upper lobe and involving the right hilum, consistent with primary bronchogenic carcinoma. Postobstructive right upper  lobe collapse noted. Hypermetabolic mediastinal and bilateral hilar lymphadenopathy was also noted with a subcentimeter hypermetabolic upper abdominal lymph node at the level of the celiac axis suspicious for metastases. He was readmitted this week with similar complaints. He is alone in his hospital room today. He states "I feel bad."  He does not remember talking about chemotherapy or that he has cancer. He states he does want treatment to feel better. I spoke with his sister Stephen Ellis on the phone who was supportive and identified his nephew Stephen Ellis as his main support although Stephen Ellis does work every day. His siter also told me he was "retarded" and couldn not read or write. She also said he had a difficult time with memory. He complains of right sided chest pain and shortness of breath. He denies headaches or focal bony pain  PREVIOUS RADIATION THERAPY: No  FAMILY HISTORY: History reviewed. No pertinent family history.  SOCIAL HISTORY:  History  Substance Use Topics  . Smoking status: Former Smoker -- 1.00 packs/day for 38 years    Quit date: 06/24/2014  . Smokeless tobacco: Never Used  . Alcohol Use: No    REVIEW OF SYSTEMS:  A 15 point review of systems is documented in the electronic medical record. This was obtained by the nursing staff. However, I reviewed this with the patient to discuss relevant findings and make appropriate changes.  Pertinent positives are included in the chart.   PHYSICAL EXAM:  Filed Vitals:   09/03/14 1348  BP: 116/76  Pulse: 92  Temp: 98.5 F (36.9 C)  Resp: 18  .168 lb (76.204 kg). Pleasant  male. Paralyzed left eye. Oriented x 2. Diminished breath sounds on the right.   IMPRESSION: Metastatic NSCLC  PLAN: We discussed chemoradiation as the definitive treatment for Stage IV lung cancer is primarily chemotherapy. We discussed radiation can be used to palliate symptoms such as his shortness of breath. We discussed this can take up to 2 weeks to work. I discussed  this with his sister Stephen Ellis on speakerphone as well as I do not believe the patient was understanding everything that I was saying. .We discussed the process of simulation and the placement of tattoos. We discussed 10 treatments as an outpatient with chemotherapy beginning after that.  We discussed possible side effects including but not limited to esophagitis, fatigue and damage to critical normal structures including heart, lung and spinal cord.  I scheduled him for simulation tomorrow 12/31 and will plan on starting his treatments Monday.  We will have to delay his chemotherapy until radiation is complete.   He signed informed consent in the room and a nurse tech signed as the witness that we had discussed the above side effects.   I spent 60 minutes  face to face with the patient and more than 50% of that time was spent in counseling and/or coordination of care.   ------------------------------------------------  Thea Silversmith, MD

## 2014-09-03 NOTE — Progress Notes (Addendum)
INITIAL NUTRITION ASSESSMENT  DOCUMENTATION CODES Per approved criteria  -Not Applicable   INTERVENTION: -Continue Ensure Complete po BID, each supplement provides 350 kcal and 13 grams of protein -Encourage PO intake -RD to continue to monitor  NUTRITION DIAGNOSIS: Unintentional weight loss related to Stage IV lung cancer as evidenced by 9% weight loss x 2 months.   Goal: Pt to meet >/= 90% of their estimated nutrition needs   Monitor:  PO and supplemental intake, weight, labs, I/O's  Reason for Assessment: Pt identified as at nutrition risk on the Malnutrition Screen Tool  Admitting Dx: dyspnea  ASSESSMENT: 68 year old male with a history of hypertension, recently diagnosed stage IIIB/4 lung cancer.   Pt denies any weight loss and states that his appetite is fine now and PTA. PO intake: 100%. Pt likes the Ensure supplements and would like to continue to receive them.  Per weight history documentation, pt has lost 17 lb since November (9% weight loss x 2 months, significant for time frame).  Nutrition focused physical exam shows no sign of depletion of muscle mass or body fat.  Labs reviewed: Low Na & K  Height: Ht Readings from Last 1 Encounters:  09/01/14 6' (1.829 m)    Weight: Wt Readings from Last 1 Encounters:  09/01/14 168 lb (76.204 kg)    Ideal Body Weight: 178 lb  % Ideal Body Weight: 94%  Wt Readings from Last 10 Encounters:  09/01/14 168 lb (76.204 kg)  08/25/14 168 lb (76.204 kg)  08/08/14 178 lb 5.6 oz (80.9 kg)  07/29/14 175 lb (79.379 kg)  07/25/14 185 lb (83.915 kg)  07/16/14 170 lb (77.111 kg)  05/07/14 184 lb (83.462 kg)  08/20/12 185 lb 14.4 oz (84.324 kg)  04/19/12 189 lb 12.8 oz (86.093 kg)  04/06/12 185 lb (83.915 kg)    Usual Body Weight: pt is unsure  % Usual Body Weight: NA  BMI:  Body mass index is 22.78 kg/(m^2).  Estimated Nutritional Needs: Kcal: 2200-2400 Protein: 105-115g Fluid: 2.2L/day  Skin: intact  Diet  Order: Diet regular  EDUCATION NEEDS: -No education needs identified at this time   Intake/Output Summary (Last 24 hours) at 09/03/14 1123 Last data filed at 09/03/14 0830  Gross per 24 hour  Intake    480 ml  Output    450 ml  Net     30 ml    Last BM: 12/27  Labs:   Recent Labs Lab 09/01/14 0433 09/02/14 0501 09/03/14 0520  NA 134* 136 131*  K 3.5 3.3* 3.2*  CL 103 102 102  CO2 25 26 23   BUN 14 14 12   CREATININE 1.05 1.10 1.07  CALCIUM 9.2 9.1 9.1  GLUCOSE 109* 98 92    CBG (last 3)  No results for input(s): GLUCAP in the last 72 hours.  Scheduled Meds: . ceFEPime (MAXIPIME) IV  1 g Intravenous 3 times per day  . enoxaparin (LOVENOX) injection  40 mg Subcutaneous Q24H  . feeding supplement (ENSURE COMPLETE)  237 mL Oral BID BM  . ipratropium-albuterol  3 mL Nebulization TID  . sodium chloride  3 mL Intravenous Q12H  . vancomycin  1,000 mg Intravenous Q12H    Continuous Infusions:   Past Medical History  Diagnosis Date  . Syncope   . Dehydration   . Blind left eye   . Hypertension   . Anemia     "low Blood"  . Dementia     poor memory  . Cancer  Lung  . Lung cancer 08/12/14    Right lung    Past Surgical History  Procedure Laterality Date  . No past surgeries    . Video bronchoscopy Bilateral 07/29/2014    Procedure: VIDEO BRONCHOSCOPY WITHOUT FLUORO;  Surgeon: Tanda Rockers, MD;  Location: [facility] ENDOSCOPY;  Service: Cardiopulmonary;  Laterality: Bilateral;  . Video bronchoscopy Bilateral 08/12/2014    Procedure: VIDEO BRONCHOSCOPY WITHOUT FLUORO;  Surgeon: Wilhelmina Mcardle, MD;  Location: [facility] ENDOSCOPY;  Service: Cardiopulmonary;  Laterality: Bilateral;    Clayton Bibles, MS, RD, LDN Pager: [phone] After Hours Pager: [phone]

## 2014-09-04 ENCOUNTER — Ambulatory Visit
Admit: 2014-09-04 | Discharge: 2014-09-04 | Disposition: A | Discharge status: Home / Self Care | Payer: Medicare Other | Attending: Radiation Oncology | Admitting: Radiation Oncology

## 2014-09-04 ENCOUNTER — Encounter: Payer: Self-pay | Admitting: Radiation Oncology

## 2014-09-04 DIAGNOSIS — C3411 Malignant neoplasm of upper lobe, right bronchus or lung: Secondary | ICD-10-CM | POA: Insufficient documentation

## 2014-09-04 DIAGNOSIS — C3490 Malignant neoplasm of unspecified part of unspecified bronchus or lung: Secondary | ICD-10-CM

## 2014-09-04 DIAGNOSIS — Z510 Encounter for antineoplastic radiation therapy: Secondary | ICD-10-CM | POA: Insufficient documentation

## 2014-09-04 DIAGNOSIS — C3491 Malignant neoplasm of unspecified part of right bronchus or lung: Secondary | ICD-10-CM

## 2014-09-04 LAB — BASIC METABOLIC PANEL
ANION GAP: 6 (ref 5–15)
BUN: 12 mg/dL (ref 6–23)
CO2: 26 mmol/L (ref 19–32)
Calcium: 9.4 mg/dL (ref 8.4–10.5)
Chloride: 100 mEq/L (ref 96–112)
Creatinine, Ser: 1.18 mg/dL (ref 0.50–1.35)
GFR calc Af Amer: 72 mL/min — ABNORMAL LOW (ref >90)
GFR, EST NON AFRICAN AMERICAN: 63 mL/min — AB (ref >90)
GLUCOSE: 114 mg/dL — AB (ref 70–99)
Potassium: 3.9 mmol/L (ref 3.5–5.1)
Sodium: 132 mmol/L — ABNORMAL LOW (ref 135–145)

## 2014-09-04 NOTE — Progress Notes (Signed)
[facility] Simulation and Treatment Planning Note   Name: Stephen Ellis MRN: [id number]  Date: 09/04/2014  DOB: 1946/06/08  Status: inpatient    DIAGNOSIS: There were no encounter diagnoses.    SIDE: right   CONSENT VERIFIED: yes   SET UP AND IMMOBILIZATION: Patient is setup supine with arms in a wing board.   NARRATIVE: The patient was brought to the [facility].  Identity was confirmed.  All relevant records and images related to the planned course of therapy were reviewed.  Then, the patient was positioned in a stable reproducible clinical set-up for radiation therapy.  CT images were obtained.  Skin markings were placed.  The CT images were loaded into the planning software where the target and avoidance structures were contoured.  The radiation prescription was entered and confirmed.   TREATMENT PLANNING NOTE:  Treatment planning then occurred. I have requested 3D simulation with [facility]: Lambert Rhonda W of the spinal cord, total lungs and gross tumor volume. I have also requested mlcs and an isodose plan.   A total of 2 complex devices will be used for beam modification purposes in the form of MLCs.

## 2014-09-04 NOTE — Progress Notes (Signed)
Simulation verification note: The patient underwent simulation verification for treatment to his right lung/hilar region.  His isocenter is in good position and the multileaf collimators contoured the treatment volume appropriately.

## 2014-09-04 NOTE — Progress Notes (Signed)
CC: Dr. Thea Silversmith   Weekly Management Note:  Site: Right lung/hilum Current Dose:  800  cGy Projected Dose: 800  cGy  Narrative: The patient is seen today for routine under treatment assessment. CBCT/MVCT images/port films were reviewed. The chart was reviewed.   He is without new complaints today.  Physical Examination: There were no vitals filed for this visit..  Weight:  .  Diminished breath sounds along the right lung zone.  Impression: Single fraction of radiation therapy well tolerated.  Plan: I understand that Dr. Pablo Ledger will see him for a follow-up visit in 2 weeks.

## 2014-09-04 NOTE — Progress Notes (Addendum)
Patient ID: Stephen Ellis, male   DOB: [birth year], 68 y.o.   MRN: [id number] [facility] PROGRESS NOTE  ULMER DEGEN DZH:[id number] DOB: May 12, 1946 DOA: 09/01/2014 PCP: Elizabeth Palau, MD  Brief narrative:    68 year old male with a history of hypertension, recently diagnosed stage IIIB/4 lung cancer (Dr. Julien Nordmann) presented with one-week history of worsening shortness of breath at rest and with exertion. Patient was recently hospitalized for treatment of postobstructive pneumonia. He is a recent CT angiogram of the chest on 07/16/2014 was negative for pulmonary embolism but it suggested a right upper lung lobe mass. Patient underwent video flexible fiberoptic bronchoscopy on 07/29/2014 with final pathology not conclusive for malignancy. On 08/12/2014 patient was seen by Dr. Alva Garnet and underwent repeat bronchoscopy which showed subtotal obstruction of the right upper lobe bronchus with mucosal infiltration involving the distal trachea and main carina. Transbronchial biopsies as well as brushings and endobronchial biopsies were performed. The final pathology (Accession: [id number]) showed minute fragments of highly atypical epithelioid cells suspicious for malignancy. PET scan was performed on 08/14/2014 and it showed hypermetabolic mass in the central right upper lobe and involving the right hilum, consistent with primary bronchogenic carcinoma. Postobstructive right upper lobe collapse noted. Patient quit smoking 1 year ago after approximately 50 pack years. In emergency department, the patient received vancomycin and cefepime for treatment of obstructive pneumonia. He was afebrile and hemodynamically stable. Oxygen saturation was 95% on 2 L.  Radiation oncology has seen the patient in consultation. Plan is for palliative radiotherapy, simulation to be done 09/04/2014. Radiation therapy likely to start Monday, 09/08/2013.  Assessment/Plan:     Principal problem: Acute respiratory  failure with hypoxia secondary to obstructive PNA and possible HCAP in the setting of lung cancer   Patient underwent last bronchoscopy on 08/12/2014. The final pathology results suspicious for malignancy. PET scan further performed on 08/14/2014 - findings consistent with primary bronchogenic carcinoma.  Patient was admitted with hypoxic respiratory failure thought to be secondary to obstructive pneumonia in the setting of primary bronchogenic carcinoma.  We will continue current treatment with broad-spectrum antibiotics, vancomycin and cefepime.  Continue oxygen support with nasal cannula.  Continue DuoNeb every 8 hours scheduled.  Active problems: Stage IIIB/4 lung cancer  Dr. Julien Nordmann has seen the patient in consultation.  Radiation oncology has also seen the patient in consultation and plan is for simulation today 09/04/2014 and hopefully radiation treatment to start Monday, 09/08/2013.  Chemotherapy likely to start once radiation therapy complete.  Hypertension  Antihypertensive medication on hold due to soft blood pressure.  Anemia of chronic disease, malignancy   Hemoglobin is stable at 12.5.  No current indications for transfusion  Hypokalemia  Perhaps because of nebulizer treatments. Supplemented.  Follow-up BMP in the morning.   DVT Prophylaxis   Lovenox subcutaneous ordered  Code Status: Full.  Family Communication:  plan of care discussed with the patient Disposition Plan: Home when stable.   IV access:  Peripheral IV  Procedures and diagnostic studies:    Dg Chest 2 View 09/01/2014    Lung cancer with progressive right lung collapse, now involving the right upper and middle lobes. There is postobstructive pneumonia or pneumonitis in the right lower lobe.    Medical Consultants:  PCCM --> signed off 09/02/2014  Oncology, Dr. Earlie Server Radiation oncology, Dr. Thea Silversmith  Other Consultants:  Physical therapy   IAnti-Infectives:   Vancomycin  12/28 --> Maxipime 12/28 -->  Leisa Lenz, MD  [facility] Pager [phone]  If 7PM-7AM,  please contact night-coverage [email or web address] Password [email or web address] 09/04/2014, 8:29 AM   LOS: 3 days    HPI/Subjective: No acute overnight events.  Objective: Filed Vitals:   09/03/14 1503 09/03/14 2115 09/03/14 2119 09/04/14 0500  BP:  123/94  126/96  Pulse:  91  92  Temp:  98 F (36.7 C)  98.6 F (37 C)  TempSrc:  Oral  Oral  Resp:  20  20  Height:      Weight:      SpO2: 99% 99% 99% 98%    Intake/Output Summary (Last 24 hours) at 09/04/14 0829 Last data filed at 09/04/14 0132  Gross per 24 hour  Intake    680 ml  Output    800 ml  Net   -120 ml    Exam:   General:  Pt is  not in acute distress  Cardiovascular: Regular rate and rhythm, S1/S2 (+)  Respiratory: Diminished breath sounds, no wheezing  Abdomen: Soft, non tender, non distended, bowel sounds present  Extremities: No edema, pulses DP and PT palpable bilaterally  Neuro: Grossly nonfocal  Data Reviewed: Basic Metabolic Panel:  Recent Labs Lab 09/01/14 0433 09/02/14 0501 09/03/14 0520  NA 134* 136 131*  K 3.5 3.3* 3.2*  CL 103 102 102  CO2 25 26 23   GLUCOSE 109* 98 92  BUN 14 14 12   CREATININE 1.05 1.10 1.07  CALCIUM 9.2 9.1 9.1   Liver Function Tests: No results for input(s): AST, ALT, ALKPHOS, BILITOT, PROT, ALBUMIN in the last 168 hours. No results for input(s): LIPASE, AMYLASE in the last 168 hours. No results for input(s): AMMONIA in the last 168 hours. CBC:  Recent Labs Lab 09/01/14 0433 09/02/14 0501 09/03/14 0520  WBC 5.4 3.5* 3.8*  NEUTROABS 4.4  --   --   HGB 11.7* 11.8* 12.5*  HCT 34.7* 35.3* 36.6*  MCV 87.6 87.8 87.4  PLT 230 226 232   Cardiac Enzymes: No results for input(s): CKTOTAL, CKMB, CKMBINDEX, TROPONINI in the last 168 hours. BNP: Invalid input(s): POCBNP CBG: No results for input(s): GLUCAP in the last 168 hours.  No results found for this or any previous  visit (from the past 240 hour(s)).   Scheduled Meds: . ceFEPime (MAXIPIME)   1 g Intravenous 3 times per day  . enoxaparin (LOVENOX) injection  40 mg Subcutaneous Q24H  . feeding supplement (ENSURE COMPLETE)  237 mL Oral BID BM  . ipratropium-albuterol  3 mL Nebulization TID  . vancomycin  1,000 mg Intravenous Q12H

## 2014-09-05 LAB — PROCALCITONIN: PROCALCITONIN: 0.53 ng/mL

## 2014-09-05 MED ORDER — AMLODIPINE BESYLATE 10 MG PO TABS
10.0000 mg | ORAL_TABLET | Freq: Every day | ORAL | Status: DC
  Administered 2014-09-05 – 2014-09-13 (×9): 10 mg via ORAL
  Filled 2014-09-05 (×9): qty 1

## 2014-09-05 NOTE — Progress Notes (Signed)
  Radiation Oncology         [phone] ________________________________  Name: Stephen Ellis MRN: [id number]  Date: 09/04/2014  DOB: July 29, 1946  End of Treatment Note  Diagnosis:   Stage IV lung cancer     Indication for treatment:  Palliative  Radiation treatment dates:  09/04/2014  Site/dose:   Right lung cancer  Beams/energy:  10 And 15 MV photons  Narrative: The patient tolerated radiation treatment relatively well.  He was able to be discharged from the hospital to a skilled nursing facility.   Plan: The patient has completed radiation treatment. The patient will return to radiation oncology clinic for routine followup in one month. I advised them to call or return sooner if they have any questions or concerns related to their recovery or treatment. This plan was discussed with his brother and sister.   ------------------------------------------------  Thea Silversmith, MD

## 2014-09-05 NOTE — Progress Notes (Addendum)
Patient ID: REI MEDLEN, male   DOB: Nov 25, 1945, 69 y.o.   MRN: [id number] [facility] PROGRESS NOTE  RONDEL EPISCOPO JXB:[id number] DOB: 05-Sep-1946 DOA: 09/01/2014 PCP: Elizabeth Palau, MD  Brief narrative:    69 year old male with a history of hypertension, recently diagnosed stage IIIB/4 lung cancer (Dr. Julien Nordmann) presented with one-week history of worsening shortness of breath at rest and with exertion. Patient was recently hospitalized for treatment of postobstructive pneumonia. He is a recent CT angiogram of the chest on 07/16/2014 was negative for pulmonary embolism but it suggested a right upper lung lobe mass. Patient underwent video flexible fiberoptic bronchoscopy on 07/29/2014 with final pathology not conclusive for malignancy. On 08/12/2014 patient was seen by Dr. Alva Garnet and underwent repeat bronchoscopy which showed subtotal obstruction of the right upper lobe bronchus with mucosal infiltration involving the distal trachea and main carina. Transbronchial biopsies as well as brushings and endobronchial biopsies were performed. The final pathology (Accession: [id number]) showed minute fragments of highly atypical epithelioid cells suspicious for malignancy. PET scan was performed on 08/14/2014 and it showed hypermetabolic mass in the central right upper lobe and involving the right hilum, consistent with primary bronchogenic carcinoma. Postobstructive right upper lobe collapse noted. Patient quit smoking 1 year ago after approximately 50 pack years. In emergency department, the patient received vancomycin and cefepime for treatment of obstructive pneumonia. He was afebrile and hemodynamically stable. Oxygen saturation was 95% on 2 L.  Radiation oncology has seen the patient in consultation. Plan is for palliative radiotherapy, simulation done 09/04/2014. Radiation therapy likely to start Monday, 09/08/2013.  Assessment/Plan:     Principal problem: Acute respiratory failure  with hypoxia secondary to obstructive PNA and possible HCAP in the setting of lung cancer   Patient underwent last bronchoscopy on 08/12/2014. The final pathology results suspicious for malignancy. PET scan further performed on 08/14/2014 - findings consistent with primary bronchogenic carcinoma.  Patient was admitted with hypoxic respiratory failure thought to be secondary to obstructive pneumonia in the setting of primary bronchogenic carcinoma.  Continue current treatment with broad-spectrum antibiotics, vancomycin and cefepime.  Continue oxygen support with nasal cannula.  Continue DuoNeb every 8 hours scheduled.  Had simulation therapy 12/31 and RT likely to start 1/4.  Active problems: Stage IIIB/4 lung cancer  Dr. Julien Nordmann has seen the patient in consultation.  Radiation oncology has also seen the patient in consultation   Simulation done 12/31 and RT to start 1/4 which may help open up right lung  Chemotherapy likely to start once radiation therapy complete.  Hypertension  Antihypertensive medication initially on hold due to soft BP. Since BP this am 148/101 will resume BP meds.  Anemia of chronic disease, malignancy   Hemoglobin is stable at 12.5.  No current indications for transfusion.  Hypokalemia  Perhaps because of nebulizer treatments. Supplemented.  Potassium is WNL.   DVT Prophylaxis   Lovenox subcutaneous ordered  Code Status: Full.  Family Communication:  plan of care discussed with the patient Disposition Plan: Home when stable.   IV access:  Peripheral IV  Procedures and diagnostic studies:    Dg Chest 2 View 09/01/2014    Lung cancer with progressive right lung collapse, now involving the right upper and middle lobes. There is postobstructive pneumonia or pneumonitis in the right lower lobe.    Medical Consultants:  PCCM --> signed off 09/02/2014  Oncology, Dr. Earlie Server Radiation oncology, Dr. Thea Silversmith  Other Consultants:   Physical therapy   IAnti-Infectives:   Vancomycin 12/28 -->  Maxipime 12/28 -->  Leisa Lenz, MD  [facility] Pager [phone]  If 7PM-7AM, please contact night-coverage [email or web address] Password [email or web address] 09/05/2014, 10:24 AM   LOS: 4 days    HPI/Subjective: No acute overnight events.  Objective: Filed Vitals:   09/04/14 2016 09/04/14 2111 09/05/14 0506 09/05/14 0737  BP:  137/84 148/101   Pulse:  101 92   Temp:  99.1 F (37.3 C) 98.6 F (37 C)   TempSrc:  Oral Oral   Resp:  18 18   Height:      Weight:      SpO2: 98% 97% 97% 98%    Intake/Output Summary (Last 24 hours) at 09/05/14 1024 Last data filed at 09/05/14 0840  Gross per 24 hour  Intake    840 ml  Output    800 ml  Net     40 ml    Exam:   General:  Pt is alert, follows commands appropriately, not in acute distress  Cardiovascular: Regular rate and rhythm, S1/S2 (+)  Respiratory: diminished, no wheezing  Abdomen: Soft, non tender, non distended, bowel sounds present  Extremities: No edema, pulses DP and PT palpable bilaterally  Neuro: Grossly nonfocal  Data Reviewed: Basic Metabolic Panel:  Recent Labs Lab 09/01/14 0433 09/02/14 0501 09/03/14 0520 09/04/14 0830  NA 134* 136 131* 132*  K 3.5 3.3* 3.2* 3.9  CL 103 102 102 100  CO2 25 26 23 26   GLUCOSE 109* 98 92 114*  BUN 14 14 12 12   CREATININE 1.05 1.10 1.07 1.18  CALCIUM 9.2 9.1 9.1 9.4   Liver Function Tests: No results for input(s): AST, ALT, ALKPHOS, BILITOT, PROT, ALBUMIN in the last 168 hours. No results for input(s): LIPASE, AMYLASE in the last 168 hours. No results for input(s): AMMONIA in the last 168 hours. CBC:  Recent Labs Lab 09/01/14 0433 09/02/14 0501 09/03/14 0520  WBC 5.4 3.5* 3.8*  NEUTROABS 4.4  --   --   HGB 11.7* 11.8* 12.5*  HCT 34.7* 35.3* 36.6*  MCV 87.6 87.8 87.4  PLT 230 226 232   Cardiac Enzymes: No results for input(s): CKTOTAL, CKMB, CKMBINDEX, TROPONINI in the last 168  hours. BNP: Invalid input(s): POCBNP CBG: No results for input(s): GLUCAP in the last 168 hours.  No results found for this or any previous visit (from the past 240 hour(s)).   Scheduled Meds: . ceFEPime (MAXIPIME) IV  1 g Intravenous 3 times per day  . enoxaparin (LOVENOX) injection  40 mg Subcutaneous Q24H  . feeding supplement (ENSURE COMPLETE)  237 mL Oral BID BM  . ipratropium-albuterol  3 mL Nebulization TID  . sodium chloride  3 mL Intravenous Q12H  . vancomycin  1,000 mg Intravenous Q12H   Continuous Infusions:

## 2014-09-05 NOTE — Progress Notes (Signed)
ANTIBIOTIC CONSULT NOTE - FOLLOW UP  Pharmacy Consult for Vancomycin / Cefepime Indication: HCAP  No Known Allergies  Patient Measurements: Height: 6' (182.9 cm) Weight: 168 lb (76.204 kg) IBW/kg (Calculated) : 77.6  Vital Signs: Temp: 98.6 F (37 C) (01/01 0506) Temp Source: Oral (01/01 0506) BP: 148/101 mmHg (01/01 0506) Pulse Rate: 92 (01/01 0506) Intake/Output from previous day: 12/31 0701 - 01/01 0700 In: 840 [P.O.:840] Out: 700 [Urine:700] Intake/Output from this shift: Total I/O In: -  Out: 100 [Urine:100]  Labs:  Recent Labs  09/03/14 0520 09/04/14 0830  WBC 3.8*  --   HGB 12.5*  --   PLT 232  --   CREATININE 1.07 1.18   Estimated Creatinine Clearance: 66.4 mL/min (by C-G formula based on Cr of 1.18).  Recent Labs  09/03/14 0520  VANCOTROUGH 14.3     Microbiology: Recent Results (from the past 720 hour(s))  Blood culture (routine x 2)     Status: None   Collection Time: 08/07/14  8:17 PM  Result Value Ref Range Status   Specimen Description BLOOD LEFT ARM  Final   Special Requests BOTTLES DRAWN AEROBIC ONLY 5CC  Final   Culture  Setup Time   Final    08/08/2014 00:55 Performed at [place]   Final    NO GROWTH 5 DAYS Performed at [facility]    Report Status 08/14/2014 FINAL  Final  Blood culture (routine x 2)     Status: None   Collection Time: 08/07/14  8:20 PM  Result Value Ref Range Status   Specimen Description BLOOD RIGHT ARM  Final   Special Requests BOTTLES DRAWN AEROBIC AND ANAEROBIC 5CC  Final   Culture  Setup Time   Final    08/08/2014 00:54 Performed at [place]   Final    NO GROWTH 5 DAYS Performed at [facility]    Report Status 08/14/2014 FINAL  Final    Anti-infectives    Start     Dose/Rate Route Frequency Ordered Stop   09/01/14 1800  vancomycin (VANCOCIN) IVPB 1000 mg/200 mL premix     1,000 mg200 mL/hr over 60 Minutes Intravenous Every 12 hours  09/01/14 0912     09/01/14 1400  ceFEPIme (MAXIPIME) 1 g in dextrose 5 % 50 mL IVPB     1 g100 mL/hr over 30 Minutes Intravenous 3 times per day 09/01/14 0552     09/01/14 0545  vancomycin (VANCOCIN) IVPB 1000 mg/200 mL premix     1,000 mg200 mL/hr over 60 Minutes Intravenous  Once 09/01/14 0537 09/01/14 0755   09/01/14 0545  ceFEPIme (MAXIPIME) 2 g in dextrose 5 % 50 mL IVPB     2 g100 mL/hr over 30 Minutes Intravenous STAT 09/01/14 0539 09/01/14 0650      Assessment: 69 yo male with newly-diagnosed lung cancer (pt reportedly not being told of dx at family request) who presents with ShOB and weakness. Plan is to begin chemoradiation on 09/08/14. CXR shows postobstructive pneumonia or pneumonitis in the right lower lobe. Pharmacy consulted to dose cefepime and vancomycin for presumed HCAP.  12/28 >> Vancomycin >> 12/28 >> Cefepime >>  Today, 09/05/2014, Day #5 Tmax: afebrile since admit WBC: sl low again 12/30 Renal: SCr 1.18 (slightly increased from 1.07), CrCl 66 CG, 62 N Procalcitonin 0.55 > 0.61 > 0.53  No cultures ordered  Levels / Dose change info: 12/30: VT= 14.3 on 1g q12h -  no change 1/2: VT at 0500 = ____ on 1g q12h, SCr = ____   Goal of Therapy:  Vancomycin trough level 15-20 mcg/ml  Cefepime dose per indication, renal function  Plan:   Continue vancomycin 1g IV q12h - recheck trough tomorrow AM given rise in SCr yesterday  Continue cefepime 1g IV q8h  Follow up de-escalation / duration of therapy, transition to [place], PharmD, BCPS Pager: [phone] 09/05/2014,1:20 PM

## 2014-09-06 DIAGNOSIS — J9601 Acute respiratory failure with hypoxia: Secondary | ICD-10-CM | POA: Diagnosis present

## 2014-09-06 DIAGNOSIS — D638 Anemia in other chronic diseases classified elsewhere: Secondary | ICD-10-CM | POA: Diagnosis present

## 2014-09-06 DIAGNOSIS — D72819 Decreased white blood cell count, unspecified: Secondary | ICD-10-CM | POA: Diagnosis present

## 2014-09-06 LAB — BASIC METABOLIC PANEL
ANION GAP: 8 (ref 5–15)
BUN: 14 mg/dL (ref 6–23)
CALCIUM: 9.4 mg/dL (ref 8.4–10.5)
CO2: 26 mmol/L (ref 19–32)
Chloride: 99 mEq/L (ref 96–112)
Creatinine, Ser: 1.15 mg/dL (ref 0.50–1.35)
GFR calc Af Amer: 75 mL/min — ABNORMAL LOW (ref >90)
GFR calc non Af Amer: 65 mL/min — ABNORMAL LOW (ref >90)
Glucose, Bld: 93 mg/dL (ref 70–99)
POTASSIUM: 3.9 mmol/L (ref 3.5–5.1)
SODIUM: 133 mmol/L — AB (ref 135–145)

## 2014-09-06 LAB — CBC
HEMATOCRIT: 36.7 % — AB (ref 39.0–52.0)
Hemoglobin: 12.2 g/dL — ABNORMAL LOW (ref 13.0–17.0)
MCH: 29.4 pg (ref 26.0–34.0)
MCHC: 33.2 g/dL (ref 30.0–36.0)
MCV: 88.4 fL (ref 78.0–100.0)
PLATELETS: 227 10*3/uL (ref 150–400)
RBC: 4.15 MIL/uL — AB (ref 4.22–5.81)
RDW: 13.4 % (ref 11.5–15.5)
WBC: 3.4 10*3/uL — ABNORMAL LOW (ref 4.0–10.5)

## 2014-09-06 LAB — VANCOMYCIN, TROUGH: Vancomycin Tr: 18.5 ug/mL (ref 10.0–20.0)

## 2014-09-06 NOTE — Progress Notes (Addendum)
Patient ID: Stephen Ellis, male   DOB: 1946-05-06, 69 y.o.   MRN: [id number] [facility] PROGRESS NOTE  Stephen Ellis [id number]:[id number] DOB: Oct 09, 1945 DOA: 09/01/2014 PCP: Elizabeth Palau, MD  Brief narrative:    69 year old male with a history of hypertension, recently diagnosed stage IIIB/4 lung cancer (Dr. Julien Nordmann) presented with one-week history of worsening shortness of breath at rest and with exertion. Patient was recently hospitalized for treatment of postobstructive pneumonia. He is a recent CT angiogram of the chest on 07/16/2014 was negative for pulmonary embolism but it suggested a right upper lung lobe mass. Patient underwent video flexible fiberoptic bronchoscopy on 07/29/2014 with final pathology not conclusive for malignancy. On 08/12/2014 patient was seen by Dr. Alva Garnet and underwent repeat bronchoscopy which showed subtotal obstruction of the right upper lobe bronchus with mucosal infiltration involving the distal trachea and main carina. Transbronchial biopsies as well as brushings and endobronchial biopsies were performed. The final pathology (Accession: [id number]) showed minute fragments of highly atypical epithelioid cells suspicious for malignancy. PET scan was performed on 08/14/2014 and it showed hypermetabolic mass in the central right upper lobe and involving the right hilum, consistent with primary bronchogenic carcinoma. Postobstructive right upper lobe collapse noted. Patient quit smoking 1 year ago after approximately 50 pack years. In emergency department, the patient received vancomycin and cefepime for treatment of obstructive pneumonia. He was afebrile and hemodynamically stable. Oxygen saturation was 95% on 2 L.  Radiation oncology has seen the patient in consultation. Plan is for palliative radiotherapy, simulation done 09/04/2014. Radiation therapy likely to start Monday, 09/08/2013.  Assessment/Plan:     Principal problem: Acute respiratory failure  with hypoxia secondary to obstructive PNA and possible HCAP in the setting of primary bronchogenic carcinoma   Patient underwent last bronchoscopy on 08/12/2014. The final pathology results suspicious for malignancy. PET scan further performed on 08/14/2014 - findings consistent with primary bronchogenic carcinoma.  Patient was admitted with hypoxic respiratory failure thought to be secondary to obstructive pneumonia in the setting of primary bronchogenic carcinoma.  Continue broad-spectrum antibiotics, vancomycin and cefepime.  Continue oxygen support with nasal cannula.  Continue DuoNeb every 8 hours scheduled.  Had simulation therapy 12/31 and RT likely to start 1/4.  Active problems: Stage IIIB/4 lung cancer  Dr. Julien Nordmann has seen the patient in consultation.  Radiation oncology has also seen the patient in consultation   Simulation done 12/31 and RT to start 1/4 which may help open up the right lung  Chemotherapy likely to start once radiation therapy complete.  Hypertension  Antihypertensive medication initially on hold due to soft BP. BP in 140's in past 24 hours so resumed BP med Norvasc 10 mg daily. This am, BP 108/80.  Anemia of chronic disease, malignancy   Hemoglobin is stable at 12.5.  No current indications for transfusion.  Hypokalemia  Perhaps because of nebulizer treatments. Supplemented.  Potassium is WNL.   DVT Prophylaxis   Lovenox subcutaneous ordered  Code Status: Full.  Family Communication:  plan of care discussed with the patient Disposition Plan: Home when stable.   IV access:  Peripheral IV  Procedures and diagnostic studies:    Dg Chest 2 View 09/01/2014    Lung cancer with progressive right lung collapse, now involving the right upper and middle lobes. There is postobstructive pneumonia or pneumonitis in the right lower lobe.    Medical Consultants:  PCCM --> signed off 09/02/2014  Oncology, Dr. Earlie Server Radiation oncology, Dr.  Thea Silversmith  Other Consultants:  Physical therapy   IAnti-Infectives:   Vancomycin 12/28 --> Maxipime 12/28 -->  Leisa Lenz, MD  [facility] Pager [phone]  If 7PM-7AM, please contact night-coverage [email or web address] Password [facility] 09/06/2014, 11:55 AM   LOS: 5 days    HPI/Subjective: No acute overnight events.  Objective: Filed Vitals:   09/05/14 1937 09/05/14 2144 09/06/14 0554 09/06/14 0756  BP:  113/87 108/80   Pulse:  90 86   Temp:  98.2 F (36.8 C) 98.4 F (36.9 C)   TempSrc:  Oral Oral   Resp:  20 20   Height:      Weight:      SpO2: 93% 99% 98% 98%    Intake/Output Summary (Last 24 hours) at 09/06/14 1155 Last data filed at 09/06/14 0757  Gross per 24 hour  Intake    420 ml  Output      0 ml  Net    420 ml    Exam:   General:  Pt is alert, follows commands appropriately, not in acute distress  Cardiovascular: Regular rate and rhythm, S1/S2, no murmurs  Respiratory: diminished, no wheezing  Abdomen: non distended, bowel sounds present   Data Reviewed: Basic Metabolic Panel:  Recent Labs Lab 09/01/14 0433 09/02/14 0501 09/03/14 0520 09/04/14 0830 09/06/14 0506  NA 134* 136 131* 132* 133*  K 3.5 3.3* 3.2* 3.9 3.9  CL 103 102 102 100 99  CO2 25 26 23 26 26   GLUCOSE 109* 98 92 114* 93  BUN 14 14 12 12 14   CREATININE 1.05 1.10 1.07 1.18 1.15  CALCIUM 9.2 9.1 9.1 9.4 9.4   Liver Function Tests: No results for input(s): AST, ALT, ALKPHOS, BILITOT, PROT, ALBUMIN in the last 168 hours. No results for input(s): LIPASE, AMYLASE in the last 168 hours. No results for input(s): AMMONIA in the last 168 hours. CBC:  Recent Labs Lab 09/01/14 0433 09/02/14 0501 09/03/14 0520 09/06/14 0506  WBC 5.4 3.5* 3.8* 3.4*  NEUTROABS 4.4  --   --   --   HGB 11.7* 11.8* 12.5* 12.2*  HCT 34.7* 35.3* 36.6* 36.7*  MCV 87.6 87.8 87.4 88.4  PLT 230 226 232 227   Cardiac Enzymes: No results for input(s): CKTOTAL, CKMB, CKMBINDEX, TROPONINI in  the last 168 hours. BNP: Invalid input(s): POCBNP CBG: No results for input(s): GLUCAP in the last 168 hours.  No results found for this or any previous visit (from the past 240 hour(s)).   Scheduled Meds: . amLODipine  10 mg Oral Daily  . ceFEPime (MAXIPIME) IV  1 g Intravenous 3 times per day  . enoxaparin (LOVENOX) injection  40 mg Subcutaneous Q24H  . feeding supplement (ENSURE COMPLETE)  237 mL Oral BID BM  . ipratropium-albuterol  3 mL Nebulization TID  . sodium chloride  3 mL Intravenous Q12H  . vancomycin  1,000 mg Intravenous Q12H   Continuous Infusions:

## 2014-09-06 NOTE — Progress Notes (Signed)
ANTIBIOTIC CONSULT NOTE - FOLLOW UP  Pharmacy Consult for Vancomycin / Cefepime Indication: HCAP  No Known Allergies  Patient Measurements: Height: 6' (182.9 cm) Weight: 168 lb (76.204 kg) IBW/kg (Calculated) : 77.6   Assessment: 69 yo male with newly-diagnosed lung cancer (pt reportedly not being told of dx at family request), presents 12/28 with ShOB and weakness. Plan is to begin chemoradiation on 09/08/14. CXR shows postobstructive pneumonia or pneumonitis in the right lower lobe. Pharmacy consulted to dose cefepime and vancomycin for presumed HCAP.  Antiinfectives 12/28 >> Vancomycin >> 12/28 >> Cefepime >>  Labs / vitals Tmax: afebrile since adm WBC: remains low at 3.4 Renal: SCr 1.15 (stable from yesterday), CrCl 68 CG, 64 N Procalcitonin 0.55>0.61>0.53  No microbiologic data this admission  Levels / Dose change info: 12/30: VT= 14.3 on 1g q12h - no change 1/2: VT at 0500 = 18.5 on 1g q12h with SCr=1.15, continue current dosing  Goal of Therapy:  Vancomycin trough level 15-20 mcg/ml  Cefepime dose per indication, renal function  Plan:   Continue vancomycin 1g IV q12h  Continue cefepime 1g IV q8h  Follow up de-escalation / duration of therapy, transition to PO  bmet in AM  Thank you for the consult.  Currie Paris, PharmD, BCPS Pager: [phone] Pharmacy: [phone] 09/06/2014 11:30 AM

## 2014-09-07 LAB — BASIC METABOLIC PANEL
ANION GAP: 7 (ref 5–15)
BUN: 13 mg/dL (ref 6–23)
CO2: 25 mmol/L (ref 19–32)
Calcium: 9.3 mg/dL (ref 8.4–10.5)
Chloride: 99 mEq/L (ref 96–112)
Creatinine, Ser: 1.07 mg/dL (ref 0.50–1.35)
GFR, EST AFRICAN AMERICAN: 82 mL/min — AB (ref >90)
GFR, EST NON AFRICAN AMERICAN: 70 mL/min — AB (ref >90)
Glucose, Bld: 93 mg/dL (ref 70–99)
POTASSIUM: 3.8 mmol/L (ref 3.5–5.1)
Sodium: 131 mmol/L — ABNORMAL LOW (ref 135–145)

## 2014-09-07 MED ORDER — IPRATROPIUM-ALBUTEROL 0.5-2.5 (3) MG/3ML IN SOLN: 3.0000 mL | RESPIRATORY_TRACT | Status: DC | PRN

## 2014-09-07 NOTE — Progress Notes (Addendum)
Patient ID: Stephen Ellis, male   DOB: 1946/03/26, 69 y.o.   MRN: [id number] [facility] PROGRESS NOTE  MARKELLE NAJARIAN [id number]:[id number] DOB: 04-May-1946 DOA: 09/01/2014 PCP: Elizabeth Palau, MD  Brief narrative:    69 year old male with a history of hypertension, recently diagnosed stage IIIB/4 lung cancer (Dr. Julien Nordmann) presented with one-week history of worsening shortness of breath at rest and with exertion. Patient was recently hospitalized for treatment of postobstructive pneumonia. He is a recent CT angiogram of the chest on 07/16/2014 was negative for pulmonary embolism but it suggested a right upper lung lobe mass. Patient underwent video flexible fiberoptic bronchoscopy on 07/29/2014 with final pathology not conclusive for malignancy. On 08/12/2014 patient was seen by Dr. Alva Garnet and underwent repeat bronchoscopy which showed subtotal obstruction of the right upper lobe bronchus with mucosal infiltration involving the distal trachea and main carina. Transbronchial biopsies as well as brushings and endobronchial biopsies were performed. The final pathology (Accession: [id number]) showed minute fragments of highly atypical epithelioid cells suspicious for malignancy. PET scan was performed on 08/14/2014 and it showed hypermetabolic mass in the central right upper lobe and involving the right hilum, consistent with primary bronchogenic carcinoma. Postobstructive right upper lobe collapse noted. Patient quit smoking 1 year ago after approximately 50 pack years. In emergency department, the patient received vancomycin and cefepime for treatment of obstructive pneumonia. He was afebrile and hemodynamically stable. Oxygen saturation was 95% on 2 L.  Radiation oncology has seen the patient in consultation. Plan is for palliative radiotherapy, simulation done 09/04/2014. Radiation therapy likely to start Monday, 09/08/2013.  Assessment/Plan:     Principal problem: Acute respiratory failure  with hypoxia secondary to obstructive PNA and possible HCAP in the setting of primary bronchogenic carcinoma   Patient underwent last bronchoscopy on 08/12/2014. The final pathology results suspicious for malignancy. PET scan further performed on 08/14/2014 - findings consistent with primary bronchogenic carcinoma.  Patient was admitted with hypoxic respiratory failure thought to be secondary to obstructive pneumonia in the setting of primary bronchogenic carcinoma.  Continue broad-spectrum antibiotics, vancomycin and cefepime.  Continue oxygen support with nasal cannula.  Continue DuoNeb every 8 hours scheduled.  Had simulation therapy 12/31 and RT likely to start 1/4.  Active problems: Stage IIIB/4 lung cancer  Appreciate Dr. Julien Nordmann seeing the patient in consultation.  Radiation oncology has also seen the patient in consultation   Simulation done 12/31 and RT to start 1/4 which may help open up the right lung  Chemotherapy likely to start once radiation therapy complete.  Hypertension  Resumed Norvasc, BP this am 95/77. Will hold am dose tomorrow if BP less than 120/80.  Anemia of chronic disease, malignancy   Hemoglobin is stable at 12.2.  No current indications for transfusion.  Hypokalemia  Perhaps because of nebulizer treatments. Supplemented.  Potassium is WNL.   DVT Prophylaxis   Lovenox subcutaneous ordered  Code Status: Full.  Family Communication:  plan of care discussed with the patient Disposition Plan: Home when stable.   IV access:  Peripheral IV  Procedures and diagnostic studies:    Dg Chest 2 View 09/01/2014    Lung cancer with progressive right lung collapse, now involving the right upper and middle lobes. There is postobstructive pneumonia or pneumonitis in the right lower lobe.    Medical Consultants:  PCCM --> signed off 09/02/2014  Oncology, Dr. Earlie Server Radiation oncology, Dr. Thea Silversmith  Other Consultants:  Physical therapy    IAnti-Infectives:   Vancomycin 12/28 --> Maxipime 12/28 -->  Leisa Lenz, MD  [facility] Pager [phone]  If 7PM-7AM, please contact night-coverage [email or web address] Password [email or web address] 09/07/2014, 10:22 AM   LOS: 6 days    HPI/Subjective: No acute overnight events.  Objective: Filed Vitals:   09/06/14 2023 09/06/14 2140 09/07/14 0548 09/07/14 0816  BP:  135/86 95/77   Pulse:  93 90   Temp:  99.7 F (37.6 C) 99.8 F (37.7 C)   TempSrc:  Oral Oral   Resp:  20 20   Height:      Weight:      SpO2: 96% 97% 98% 98%    Intake/Output Summary (Last 24 hours) at 09/07/14 1022 Last data filed at 09/07/14 0917  Gross per 24 hour  Intake    320 ml  Output    250 ml  Net     70 ml    Exam:   General:  Pt is alert, follows commands appropriately, not in acute distress  Cardiovascular: Regular rate and rhythm, S1/S2 (+)  Respiratory: diminished over right, no wheezing  Abdomen: Soft, non tender, non distended, bowel sounds present  Extremities: No edema, pulses DP and PT palpable bilaterally  Neuro: Grossly nonfocal  Data Reviewed: Basic Metabolic Panel:  Recent Labs Lab 09/02/14 0501 09/03/14 0520 09/04/14 0830 09/06/14 0506 09/07/14 0500  NA 136 131* 132* 133* 131*  K 3.3* 3.2* 3.9 3.9 3.8  CL 102 102 100 99 99  CO2 26 23 26 26 25   GLUCOSE 98 92 114* 93 93  BUN 14 12 12 14 13   CREATININE 1.10 1.07 1.18 1.15 1.07  CALCIUM 9.1 9.1 9.4 9.4 9.3   Liver Function Tests: No results for input(s): AST, ALT, ALKPHOS, BILITOT, PROT, ALBUMIN in the last 168 hours. No results for input(s): LIPASE, AMYLASE in the last 168 hours. No results for input(s): AMMONIA in the last 168 hours. CBC:  Recent Labs Lab 09/01/14 0433 09/02/14 0501 09/03/14 0520 09/06/14 0506  WBC 5.4 3.5* 3.8* 3.4*  NEUTROABS 4.4  --   --   --   HGB 11.7* 11.8* 12.5* 12.2*  HCT 34.7* 35.3* 36.6* 36.7*  MCV 87.6 87.8 87.4 88.4  PLT 230 226 232 227   Cardiac Enzymes: No results for  input(s): CKTOTAL, CKMB, CKMBINDEX, TROPONINI in the last 168 hours. BNP: Invalid input(s): POCBNP CBG: No results for input(s): GLUCAP in the last 168 hours.  No results found for this or any previous visit (from the past 240 hour(s)).   Scheduled Meds: . amLODipine  10 mg Oral Daily  . ceFEPime (MAXIPIME) IV  1 g Intravenous 3 times per day  . enoxaparin (LOVENOX) injection  40 mg Subcutaneous Q24H  . feeding supplement (ENSURE COMPLETE)  237 mL Oral BID BM  . sodium chloride  3 mL Intravenous Q12H  . vancomycin  1,000 mg Intravenous Q12H   Continuous Infusions:

## 2014-09-08 ENCOUNTER — Ambulatory Visit: Payer: Medicare HMO

## 2014-09-08 ENCOUNTER — Ambulatory Visit
Admission: RE | Admit: 2014-09-08 | Discharge: 2014-09-08 | Disposition: A | Discharge status: Home / Self Care | Payer: Medicare HMO | Source: Ambulatory Visit | Attending: Radiation Oncology | Admitting: Radiation Oncology

## 2014-09-08 ENCOUNTER — Ambulatory Visit: Payer: Medicare Other | Admitting: Radiation Oncology

## 2014-09-08 HISTORY — DX: Malignant neoplasm of unspecified part of unspecified bronchus or lung: C34.90

## 2014-09-08 NOTE — Progress Notes (Addendum)
Clinical Social Work Department CLINICAL SOCIAL WORK PLACEMENT NOTE 09/08/2014  Patient:  Stephen Ellis, Stephen Ellis  Account Number:  [id number] Admit date:  09/01/2014  Clinical Social Worker:  Sindy Messing, LCSW  Date/time:  09/08/2014 11:15 AM  Clinical Social Work is seeking post-discharge placement for this patient at the following level of care:   SKILLED NURSING   (*CSW will update this form in Epic as items are completed)   09/08/2014  Patient/family provided with [place] Department of Clinical Social Work's list of facilities offering this level of care within the geographic area requested by the patient (or if unable, by the patient's family).  09/08/2014  Patient/family informed of their freedom to choose among providers that offer the needed level of care, that participate in Medicare, Medicaid or managed care program needed by the patient, have an available bed and are willing to accept the patient.  09/08/2014  Patient/family informed of [facility]' ownership interest in [facility], as well as of the fact that they are under no obligation to receive care at this facility.  PASARR submitted to EDS on 09/08/2014 PASARR number received on 09/08/2014  FL2 transmitted to all facilities in geographic area requested by pt/family on  09/08/2014 FL2 transmitted to all facilities within larger geographic area on   Patient informed that his/her managed care company has contracts with or will negotiate with  certain facilities, including the following:     Patient/family informed of bed offers received:  09/09/14 Patient chooses bed at [facility] Physician recommends and patient chooses bed at    Patient to be transferred to [facility] on   Patient to be transferred to facility by  Patient and family notified of transfer on  Name of family member notified:    The following physician request were entered in Epic:   Additional Comments:

## 2014-09-08 NOTE — Evaluation (Signed)
Physical Therapy Evaluation Patient Details Name: Stephen Ellis MRN: [id number] DOB: 07-08-1946 Today's Date: 09/08/2014   History of Present Illness  69 year old male with a history of hypertension, recently diagnosed stage IIIB/4 lung cancer (Dr. Julien Nordmann) presents with one-week history of worsening shortness of breath.PET scan was performed on 08/14/2014 and it showed Hypermetabolic mass in the central right upper lobe and involving the right hilum, consistent with primary bronchogenic carcinoma. Postobstructive right upper lobe collapse noted.   Clinical Impression  Pt admitted with above diagnosis. Pt currently with functional limitations due to the deficits listed below (see PT Problem List).  Pt will benefit from skilled PT to increase their independence and safety with mobility to allow discharge to the venue listed below.  Pt refused gait at evaluation, but was unsteady with SPT and demonstrated decreased safety with sitting too early.  At this point would recommend SNF for further rehab after d/c.     Follow Up Recommendations SNF    Equipment Recommendations  None recommended by PT    Recommendations for Other Services OT consult     Precautions / Restrictions Precautions Precautions: Fall      Mobility  Bed Mobility Overal bed mobility: Needs Assistance Bed Mobility: Supine to Sit     Supine to sit: HOB elevated;Min guard        Transfers Overall transfer level: Needs assistance Equipment used: Rolling walker (2 wheeled) Transfers: Stand Pivot Transfers;Sit to/from Stand Sit to Stand: Min assist Stand pivot transfers: Mod assist       General transfer comment: Pt wanting to sit too early and needed A to turn hips fully in front of chair.  Pt with unsteadiness and very shaky with transfer.  Ambulation/Gait             General Gait Details: Pt refused gait.  Stairs            Wheelchair Mobility    Modified Rankin (Stroke Patients Only)        Balance Overall balance assessment: Needs assistance           Standing balance-Leahy Scale: Poor                               Pertinent Vitals/Pain Pain Assessment: No/denies pain o2 98% on room air    Home Living Family/patient expects to be discharged to:: Skilled nursing facility Living Arrangements: Non-relatives/Friends Available Help at Discharge: Family;Friend(s);Available 24 hours/day Type of Home: Other(Comment) (boarding house) Home Access: Stairs to enter Entrance Stairs-Rails: None Entrance Stairs-Number of Steps: 1 Home Layout: One level Home Equipment: Walker - 2 wheels      Prior Function Level of Independence: Independent with assistive device(s)               Hand Dominance        Extremity/Trunk Assessment   Upper Extremity Assessment: Generalized weakness           Lower Extremity Assessment: Generalized weakness         Communication   Communication: No difficulties  Cognition Arousal/Alertness: Awake/alert Behavior During Therapy: WFL for tasks assessed/performed Overall Cognitive Status: No family/caregiver present to determine baseline cognitive functioning (Slow processing at times)                      General Comments General comments (skin integrity, edema, etc.): Pt needed encouragement to participate.  He would agree to get OOB  and then state he didn't want to, and went back and forth several times.  In the end he was agreeable to get up to recliner, but refused any ambulation.    Exercises        Assessment/Plan    PT Assessment Patient needs continued PT services  PT Diagnosis Difficulty walking;Generalized weakness   PT Problem List Decreased strength;Decreased activity tolerance;Decreased balance;Decreased mobility  PT Treatment Interventions Gait training;DME instruction;Functional mobility training;Therapeutic activities;Therapeutic exercise   PT Goals (Current goals can be  found in the Care Plan section) Acute Rehab PT Goals Patient Stated Goal: Unable to state therapy goals.  He wanted grits.  Discussed possible need for SNF placement for more rehab, but will continued education regarding this, as he did not seem to fully comprehend. PT Goal Formulation: With patient Time For Goal Achievement: 09/22/14 Potential to Achieve Goals: Good    Frequency Min 3X/week   Barriers to discharge        Co-evaluation               End of Session Equipment Utilized During Treatment: Gait belt Activity Tolerance: Patient tolerated treatment well Patient left: in chair;with call bell/phone within reach Nurse Communication: Mobility status         Time: 1914-7829 PT Time Calculation (min) (ACUTE ONLY): 19 min   Charges:   PT Evaluation $Initial PT Evaluation Tier I: 1 Procedure PT Treatments $Therapeutic Activity: 8-22 mins   PT G Codes:        Aino Heckert LUBECK 09/08/2014, 10:56 AM

## 2014-09-08 NOTE — Progress Notes (Addendum)
Patient ID: Stephen Ellis, male   DOB: 1946-07-23, 69 y.o.   MRN: [id number] [facility] PROGRESS NOTE  Stephen Ellis IBB:[id number] DOB: 01-23-1946 DOA: 09/01/2014 PCP: Elizabeth Palau, MD  Brief narrative:    69 year old male with a history of hypertension, recently diagnosed stage IIIB/4 lung cancer (Dr. Julien Nordmann) presented with one-week history of worsening shortness of breath at rest and with exertion. Patient was recently hospitalized for treatment of postobstructive pneumonia. He is a recent CT angiogram of the chest on 07/16/2014 was negative for pulmonary embolism but it suggested a right upper lung lobe mass. Patient underwent video flexible fiberoptic bronchoscopy on 07/29/2014 with final pathology not conclusive for malignancy. On 08/12/2014 patient was seen by Dr. Alva Garnet and underwent repeat bronchoscopy which showed subtotal obstruction of the right upper lobe bronchus with mucosal infiltration involving the distal trachea and main carina. Transbronchial biopsies as well as brushings and endobronchial biopsies were performed. The final pathology (Accession: [id number]) showed minute fragments of highly atypical epithelioid cells suspicious for malignancy. PET scan was performed on 08/14/2014 and it showed hypermetabolic mass in the central right upper lobe and involving the right hilum, consistent with primary bronchogenic carcinoma. Postobstructive right upper lobe collapse noted. Patient quit smoking 1 year ago after approximately 50 pack years. In emergency department, the patient received vancomycin and cefepime for treatment of obstructive pneumonia. He was afebrile and hemodynamically stable. Oxygen saturation was 95% on 2 L.  Radiation oncology has seen the patient in consultation. Plan is for palliative radiotherapy, simulation done 09/04/2014. Radiation therapy likely to start Monday, 09/08/2013.  Assessment/Plan:     Principal problem: Acute respiratory failure  with hypoxia secondary to obstructive PNA and possible HCAP in the setting of primary bronchogenic carcinoma   Patient underwent last bronchoscopy on 08/12/2014. The final pathology results suspicious for malignancy. PET scan further performed on 08/14/2014 - findings consistent with primary bronchogenic carcinoma.  Patient was admitted with hypoxic respiratory failure thought to be secondary to obstructive pneumonia in the setting of primary bronchogenic carcinoma.  Continue broad-spectrum antibiotics, vancomycin and cefepime.  Continue oxygen support with nasal cannula.  Continue DuoNeb every 8 hours scheduled.  Had simulation therapy 12/31 and RT planned for 09/08/2014.  Active problems: Stage IIIB/4 lung cancer  Appreciate Dr. Julien Nordmann seeing the patient in consultation.  Radiation oncology has also seen the patient in consultation   Simulation done 12/31 and RT to start today 1/4 which may help open up the right lung  Chemotherapy likely to start once radiation therapy complete.  Hypertension  Blood pressure stable, 122/67. Continue Norvasc daily.  Anemia of chronic disease, malignancy   Hemoglobin is stable at 12.2.  No current indications for transfusion.  Hypokalemia  Perhaps because of nebulizer treatments. Supplemented.  Potassium is WNL.   DVT Prophylaxis   Lovenox subcutaneous ordered  Code Status: Full.  Family Communication:  plan of care discussed with the patient Disposition Plan: Home when stable.   IV access:  Peripheral IV  Procedures and diagnostic studies:    Dg Chest 2 View 09/01/2014    Lung cancer with progressive right lung collapse, now involving the right upper and middle lobes. There is postobstructive pneumonia or pneumonitis in the right lower lobe.    Medical Consultants:  PCCM --> signed off 09/02/2014  Oncology, Dr. Earlie Server Radiation oncology, Dr. Thea Silversmith  Other Consultants:  Physical therapy   IAnti-Infectives:    Vancomycin 12/28 --> Maxipime 12/28 -->    Dhyana Bastone, MD  [facility] Pager [phone]  If 7PM-7AM, please contact night-coverage [email or web address] Password [email or web address] 09/08/2014, 10:28 AM   LOS: 7 days    HPI/Subjective: No acute overnight events.  Objective: Filed Vitals:   09/08/14 0327 09/08/14 0615 09/08/14 0627 09/08/14 0900  BP:  122/67    Pulse:  116    Temp: 99.2 F (37.3 C) 101.2 F (38.4 C) 97.7 F (36.5 C)   TempSrc: Oral Oral Oral   Resp:  18    Height:      Weight:      SpO2:  98%  98%    Intake/Output Summary (Last 24 hours) at 09/08/14 1028 Last data filed at 09/08/14 0616  Gross per 24 hour  Intake    280 ml  Output    200 ml  Net     80 ml    Exam:   General:  Pt is not in acute distress  Cardiovascular: Regular rate and rhythm, S1/S2 (+)  Respiratory: diminished breath sounds on right  Abdomen: Soft, non tender, non distended, bowel sounds present  Extremities: No edema, pulses DP and PT palpable bilaterally  Neuro: Grossly nonfocal  Data Reviewed: Basic Metabolic Panel:  Recent Labs Lab 09/02/14 0501 09/03/14 0520 09/04/14 0830 09/06/14 0506 09/07/14 0500  NA 136 131* 132* 133* 131*  K 3.3* 3.2* 3.9 3.9 3.8  CL 102 102 100 99 99  CO2 26 23 26 26 25   GLUCOSE 98 92 114* 93 93  BUN 14 12 12 14 13   CREATININE 1.10 1.07 1.18 1.15 1.07  CALCIUM 9.1 9.1 9.4 9.4 9.3   Liver Function Tests: No results for input(s): AST, ALT, ALKPHOS, BILITOT, PROT, ALBUMIN in the last 168 hours. No results for input(s): LIPASE, AMYLASE in the last 168 hours. No results for input(s): AMMONIA in the last 168 hours. CBC:  Recent Labs Lab 09/02/14 0501 09/03/14 0520 09/06/14 0506  WBC 3.5* 3.8* 3.4*  HGB 11.8* 12.5* 12.2*  HCT 35.3* 36.6* 36.7*  MCV 87.8 87.4 88.4  PLT 226 232 227   Cardiac Enzymes: No results for input(s): CKTOTAL, CKMB, CKMBINDEX, TROPONINI in the last 168 hours. BNP: Invalid input(s): POCBNP CBG: No results  for input(s): GLUCAP in the last 168 hours.  No results found for this or any previous visit (from the past 240 hour(s)).   Scheduled Meds: . amLODipine  10 mg Oral Daily  . ceFEPime (MAXIPIME) IV  1 g Intravenous 3 times per day  . enoxaparin (LOVENOX) injection  40 mg Subcutaneous Q24H  . feeding supplement (ENSURE COMPLETE)  237 mL Oral BID BM  . sodium chloride  3 mL Intravenous Q12H  . vancomycin  1,000 mg Intravenous Q12H   Continuous Infusions:

## 2014-09-08 NOTE — Progress Notes (Signed)
Clinical Social Work Department BRIEF PSYCHOSOCIAL ASSESSMENT 09/08/2014  Patient:  Stephen Ellis, Stephen Ellis     Account Number:  [id number]     Admit date:  09/01/2014  Clinical Social Worker:  Earlie Server  Date/Time:  09/08/2014 11:15 AM  Referred by:  Physician  Date Referred:  09/08/2014 Referred for  SNF Placement   Other Referral:   Interview type:  Patient Other interview type:    PSYCHOSOCIAL DATA Living Status:  ALONE Admitted from facility:   Level of care:   Primary support name:  Stephen Ellis Primary support relationship to patient:  FAMILY Degree of support available:   Strong    CURRENT CONCERNS Current Concerns  Post-Acute Placement   Other Concerns:    SOCIAL WORK ASSESSMENT / PLAN CSW received referral in order to assist with DC planning. CSW reviewed chart and met with patient at bedside. CSW introduced myself and explained role.    Patient reports he lives at home alone and does feel that he has been getting weaker while in the hospital. Patient reports he has good support but is understanding of PT recommendations for SNF placement and agreeable to placement. Patient reports his nephew Stephen Ellis) is involved with plans as well. CSW spoke with nephew via phone who reports he feels SNF is best for patient because he has been sick and needs to ensure he is following up with cancer center for treatment.    CSW left SNF list in room with CSW contact information. CSW completed FL2 and completed [facility]. CSW will follow up with bed offers.   Assessment/plan status:  Psychosocial Support/Ongoing Assessment of Needs Other assessment/ plan:   Information/referral to community resources:   SNF list    PATIENT'S/FAMILY'S RESPONSE TO PLAN OF CARE: Patient alert and oriented but has flat affect and disengaged throughout assessment. Patient reports he is agreeable to SNF placement but agreeable for CSW to discuss plans with nephew. Nephew is happy for SNF  placement and wants to ensure that patient is following up with cancer center for treatment. Nephew will call CSW with any further concerns.       [name], [place] [phone]

## 2014-09-09 ENCOUNTER — Ambulatory Visit: Payer: Medicare Other

## 2014-09-09 ENCOUNTER — Ambulatory Visit: Payer: Medicare Other | Admitting: Radiation Oncology

## 2014-09-09 ENCOUNTER — Other Ambulatory Visit: Payer: Medicare Other

## 2014-09-09 ENCOUNTER — Encounter: Payer: Self-pay | Admitting: Radiation Oncology

## 2014-09-09 ENCOUNTER — Ambulatory Visit: Payer: Medicare Other | Admitting: Physician Assistant

## 2014-09-09 ENCOUNTER — Encounter: Payer: Medicare Other | Admitting: Nutrition

## 2014-09-09 LAB — CBC
HCT: 36.1 % — ABNORMAL LOW (ref 39.0–52.0)
Hemoglobin: 11.9 g/dL — ABNORMAL LOW (ref 13.0–17.0)
MCH: 29 pg (ref 26.0–34.0)
MCHC: 33 g/dL (ref 30.0–36.0)
MCV: 88 fL (ref 78.0–100.0)
Platelets: 155 10*3/uL (ref 150–400)
RBC: 4.1 MIL/uL — AB (ref 4.22–5.81)
RDW: 13.6 % (ref 11.5–15.5)
WBC: 2.4 10*3/uL — ABNORMAL LOW (ref 4.0–10.5)

## 2014-09-09 LAB — BASIC METABOLIC PANEL
Anion gap: 10 (ref 5–15)
BUN: 25 mg/dL — ABNORMAL HIGH (ref 6–23)
CALCIUM: 8.8 mg/dL (ref 8.4–10.5)
CO2: 22 mmol/L (ref 19–32)
Chloride: 97 mEq/L (ref 96–112)
Creatinine, Ser: 1.66 mg/dL — ABNORMAL HIGH (ref 0.50–1.35)
GFR calc non Af Amer: 41 mL/min — ABNORMAL LOW (ref >90)
GFR, EST AFRICAN AMERICAN: 48 mL/min — AB (ref >90)
Glucose, Bld: 108 mg/dL — ABNORMAL HIGH (ref 70–99)
Potassium: 3.8 mmol/L (ref 3.5–5.1)
Sodium: 129 mmol/L — ABNORMAL LOW (ref 135–145)

## 2014-09-09 LAB — VANCOMYCIN, TROUGH: Vancomycin Tr: 26.5 ug/mL (ref 10.0–20.0)

## 2014-09-09 MED ORDER — SODIUM CHLORIDE 0.9 % IV SOLN
INTRAVENOUS | Status: AC
  Administered 2014-09-09 – 2014-09-11 (×3): via INTRAVENOUS

## 2014-09-09 MED ORDER — CEFEPIME HCL 1 G IJ SOLR
1.0000 g | INTRAMUSCULAR | Status: DC
  Administered 2014-09-10 – 2014-09-11 (×2): 1 g via INTRAVENOUS
  Filled 2014-09-09 (×2): qty 1

## 2014-09-09 MED ORDER — SODIUM CHLORIDE 0.9 % IV SOLN: INTRAVENOUS | Status: DC

## 2014-09-09 NOTE — Progress Notes (Addendum)
Clinical Social Work  CSW spoke with POA Stephen Ellis) and provided bed offers. Family has chosen [facility]. CSW agreeable to keep family updated when patient is medically stable to DC. CSW spoke with Suanne Marker at [place] who is aware that patient wants to accept bed offer but not medically stable. SNF agreeable to accept and just wants updates on DC date. SNF already has oncology follow up appointment list and aware of chemo/radiation needs at DC. CSW will continue to follow.  [place], [place] [phone]  Addendum [phone] Son Stephen Ellis # [phone]) arrived on unit and reports he is patient's son and was not informed that patient was in the hospital. Son reports that [name] assists as needed and helps with making decisions but was wondering about being patient's HCPOA. CSW explained that Stephen Ellis has reported that he has HCPOA and is going to bring paperwork to unit for patient's chart. CSW explained that since patient has been confused at times then patient is not capable of signing any HCPOA at this time but encouraged son to seek legal advice through attorney if needed. Son reports he just wants to be kept updated about plans and as long as Stephen Ellis is making the best choice for patient then he does not mind him being POA. CSW provided son with CSW contact information if further needs arise. CSW will await to see HCPOA from [place]. Since no HCPOA paperwork present at this time and patient gave permission, CSW shared information re: SNF placement at DC with son. Son agreeable and feels this is best plan for patient as well.

## 2014-09-09 NOTE — Progress Notes (Signed)
ANTIBIOTIC CONSULT NOTE - FOLLOW UP  Pharmacy Consult for Vancomycin / Cefepime Indication: HCAP  No Known Allergies  Patient Measurements: Height: 6' (182.9 cm) Weight: 168 lb (76.204 kg) IBW/kg (Calculated) : 77.6   Assessment: 69 yo male with newly-diagnosed lung cancer (pt reportedly not being told of dx at family request), presents 12/28 with ShOB and weakness. Plan is to begin chemoradiation on 09/08/14. CXR shows postobstructive pneumonia or pneumonitis in the right lower lobe. Pharmacy consulted to dose cefepime and vancomycin for presumed HCAP.  Antiinfectives 12/28 >> Vancomycin >> 12/28 >> Cefepime >>  Labs / vitals Tmax24: 102.9 WBC: remains low, decreased further to 2.4 Renal: SCr bumped 1.66, CrCl 47 CG, 44N Procalcitonin 0.55>0.61>0.53  No microbiologic data this admission  Levels / Dose change info: 12/30: VT= 14.3 on 1g q12h - no change 1/2: VT at 0500 = 18.5 on 1g q12h with SCr=1.15, continue current dosing 1/5: Decreased Cefepime 1g q8h -->1g q24h d/t CrCl decreased to 47 ml/min 1/5: VT at 1700 = _____ on 1g q12h, checking after SCr bumped to 1.66  Goal of Therapy:  Vancomycin trough level 15-20 mcg/ml  Cefepime dose per indication, renal function  Plan:  Hold vancomycin, check trough tonight Reduce cefepime to 1g q24h BMET tomorrow   Ralene Bathe, PharmD, BCPS 09/09/2014, 7:39 AM  Phone: [phone]

## 2014-09-09 NOTE — Progress Notes (Signed)
ANTIBIOTIC CONSULT NOTE - FOLLOW UP  See progress note from Ralene Bathe, PharmD for full details.  In brief, 69 yo male on day #9 vancomycin and cefepime and HCAP with continued fevers, rising SCr and blood cultures from today pending.  Vancomycin trough supratherapeutic = 26.5 mcg/ml  Plan:  Hold vancomycin  Recheck random level and SCr in AM 1/6  Peggyann Juba, PharmD, BCPS Pager: [phone] 09/09/2014 6:21 PM

## 2014-09-09 NOTE — Progress Notes (Signed)
Talked to Patients bother, Nicole Kindred, regarding patient's change in radiation plan. Single fraction given as he was noted to have collapse of the right upper middle and part of the right lower lung on CT simulation. I did not feel it was safe to wait the weekend for this to resolve and start treatment. I also did not want to delay his chemotherapy unnecessarily with 10 fractions as he is at high risk for recurrence given his N3 nodes.  Further, his understanding and ability to comply with treatment was somewhat compromised. I felt a single fraction with re-evaluation in a few weeks would be the most appropriate.

## 2014-09-09 NOTE — Progress Notes (Addendum)
Patient ID: Stephen Ellis, male   DOB: 1946/01/21, 69 y.o.   MRN: [id number] [facility] PROGRESS NOTE  Stephen Ellis ZYS:[id number] DOB: 08-07-1946 DOA: 09/01/2014 PCP: Elizabeth Palau, MD  Brief narrative:    69 year old male with a history of hypertension, recently diagnosed stage IIIB/4 lung cancer (Dr. Julien Nordmann) presented with one-week history of worsening shortness of breath at rest and with exertion. Patient was recently hospitalized for treatment of postobstructive pneumonia. He is a recent CT angiogram of the chest on 07/16/2014 was negative for pulmonary embolism but it suggested a right upper lung lobe mass. Patient underwent video flexible fiberoptic bronchoscopy on 07/29/2014 with final pathology not conclusive for malignancy. On 08/12/2014 patient was seen by Dr. Alva Garnet and underwent repeat bronchoscopy which showed subtotal obstruction of the right upper lobe bronchus with mucosal infiltration involving the distal trachea and main carina. Transbronchial biopsies as well as brushings and endobronchial biopsies were performed. The final pathology (Accession: [id number]) showed minute fragments of highly atypical epithelioid cells suspicious for malignancy. PET scan was performed on 08/14/2014 and it showed hypermetabolic mass in the central right upper lobe and involving the right hilum, consistent with primary bronchogenic carcinoma. Postobstructive right upper lobe collapse noted. Patient quit smoking 1 year ago after approximately 50 pack years.   In emergency department, the patient received vancomycin and cefepime for treatment of obstructive pneumonia. He was afebrile and hemodynamically stable. Oxygen saturation was 95% on 2 L.  Radiation oncology has seen the patient in consultation. Plan is for palliative radiotherapy, simulation done 09/04/2014. Radiation therapy planned per radiation oncology while pt is in hospital.   Assessment/Plan:     Principal  problem: Acute respiratory failure with hypoxia secondary to obstructive PNA and possible HCAP in the setting of primary bronchogenic carcinoma   Patient underwent last bronchoscopy on 08/12/2014. The final pathology results suspicious for malignancy. PET scan further performed on 08/14/2014 - findings consistent with primary bronchogenic carcinoma.  Patient was admitted with hypoxic respiratory failure thought to be secondary to obstructive pneumonia in the setting of primary bronchogenic carcinoma.  Patient was started on broad-spectrum antibiotics, vancomycin and cefepime. Because his renal function is worse today vancomycin is stopped. Continue cefepime.  Please note patient spiked fever overnight so blood cultures were obtained.  Respiratory status is stable.  Continue oxygen support via nasal cannula to keep oxygen saturation above 90%.  Continue duo neb as needed every 4 hours for shortness of breath or wheezing.  Had simulation therapy 12/31 and RT planned for 09/08/2014 but for some reason and not done yesterday. Will follow-up with radiation oncology and hopefully treatment will start today.  Active problems: Stage IIIB/4 lung cancer  Appreciate Dr. Julien Nordmann seeing the patient in consultation.  Radiation oncology has also seen the patient in consultation   Simulation done 12/31. Hopefully treatment will start today, will follow-up with radiation oncology.  Chemotherapy likely to start once radiation therapy complete.  Hypertension  Blood pressure stable, 113/68. Continue Norvasc daily.  Anemia of chronic disease, malignancy   Hemoglobin is stable at 12.2, 11.9.  No current indications for transfusion.  Hypokalemia  Perhaps because of nebulizer treatments. Supplemented.  Potassium is WNL.  Acute renal failure  Likely secondary to vancomycin which we stopped 09/09/2014  Give IV fluids for next 24 hours in follow-up BMP in the morning.  Hyponatremia  Sodium  level down to 129. Likely from lung cancer.  Full give IV fluids and see if sodium level improves.   DVT Prophylaxis  Lovenox subcutaneous ordered  Code Status: Full.  Family Communication:  plan of care discussed with the patient Disposition Plan: Home when stable.   IV access:  Peripheral IV  Procedures and diagnostic studies:    Dg Chest 2 View 09/01/2014    Lung cancer with progressive right lung collapse, now involving the right upper and middle lobes. There is postobstructive pneumonia or pneumonitis in the right lower lobe.    Medical Consultants:  PCCM --> signed off 09/02/2014  Oncology, Dr. Earlie Server Radiation oncology, Dr. Thea Silversmith  Other Consultants:  Physical therapy   IAnti-Infectives:   Vancomycin 12/28 --> Maxipime 12/28 -->     Leisa Lenz, MD  [facility] Pager [phone]  If 7PM-7AM, please contact night-coverage [email or web address] Password [email or web address] 09/09/2014, 10:52 AM   LOS: 8 days    HPI/Subjective: No acute overnight events.  Objective: Filed Vitals:   09/08/14 2012 09/08/14 2140 09/09/14 0432 09/09/14 0648  BP: 129/84  113/68   Pulse: 110  110   Temp: 102.9 F (39.4 C) 102.9 F (39.4 C) 102 F (38.9 C) 100 F (37.8 C)  TempSrc: Oral Oral Oral Oral  Resp: 16  20   Height:      Weight:      SpO2: 94%  94%     Intake/Output Summary (Last 24 hours) at 09/09/14 1052 Last data filed at 09/09/14 0434  Gross per 24 hour  Intake    240 ml  Output    350 ml  Net   -110 ml    Exam:   General:  Pt is not in acute distress  Cardiovascular: Regular rate and rhythm, S1/S2 (+)  Respiratory: Diminished right side, no wheezing  Abdomen: Soft, non tender, non distended, bowel sounds present   Data Reviewed: Basic Metabolic Panel:  Recent Labs Lab 09/03/14 0520 09/04/14 0830 09/06/14 0506 09/07/14 0500 09/09/14 0453  NA 131* 132* 133* 131* 129*  K 3.2* 3.9 3.9 3.8 3.8  CL 102 100 99 99 97  CO2 23 26 26 25 22    GLUCOSE 92 114* 93 93 108*  BUN 12 12 14 13  25*  CREATININE 1.07 1.18 1.15 1.07 1.66*  CALCIUM 9.1 9.4 9.4 9.3 8.8   Liver Function Tests: No results for input(s): AST, ALT, ALKPHOS, BILITOT, PROT, ALBUMIN in the last 168 hours. No results for input(s): LIPASE, AMYLASE in the last 168 hours. No results for input(s): AMMONIA in the last 168 hours. CBC:  Recent Labs Lab 09/03/14 0520 09/06/14 0506 09/09/14 0453  WBC 3.8* 3.4* 2.4*  HGB 12.5* 12.2* 11.9*  HCT 36.6* 36.7* 36.1*  MCV 87.4 88.4 88.0  PLT 232 227 155   Cardiac Enzymes: No results for input(s): CKTOTAL, CKMB, CKMBINDEX, TROPONINI in the last 168 hours. BNP: Invalid input(s): POCBNP CBG: No results for input(s): GLUCAP in the last 168 hours.  No results found for this or any previous visit (from the past 240 hour(s)).   Scheduled Meds: . amLODipine  10 mg Oral Daily  . [START ON 09/10/2014] ceFEPime (MAXIPIME) IV  1 g Intravenous Q24H  . enoxaparin (LOVENOX) injection  40 mg Subcutaneous Q24H  . feeding supplement (ENSURE COMPLETE)  237 mL Oral BID BM  . sodium chloride  3 mL Intravenous Q12H   Continuous Infusions:

## 2014-09-10 ENCOUNTER — Inpatient Hospital Stay (HOSPITAL_COMMUNITY): Payer: Medicare Other

## 2014-09-10 ENCOUNTER — Ambulatory Visit: Payer: Medicare Other

## 2014-09-10 DIAGNOSIS — N179 Acute kidney failure, unspecified: Secondary | ICD-10-CM

## 2014-09-10 LAB — OSMOLALITY: Osmolality: 282 mOsm/kg (ref 275–300)

## 2014-09-10 LAB — BASIC METABOLIC PANEL
ANION GAP: 7 (ref 5–15)
BUN: 34 mg/dL — AB (ref 6–23)
CHLORIDE: 100 meq/L (ref 96–112)
CO2: 22 mmol/L (ref 19–32)
CREATININE: 2.43 mg/dL — AB (ref 0.50–1.35)
Calcium: 8.7 mg/dL (ref 8.4–10.5)
GFR calc Af Amer: 30 mL/min — ABNORMAL LOW (ref >90)
GFR, EST NON AFRICAN AMERICAN: 26 mL/min — AB (ref >90)
Glucose, Bld: 95 mg/dL (ref 70–99)
Potassium: 3.9 mmol/L (ref 3.5–5.1)
Sodium: 129 mmol/L — ABNORMAL LOW (ref 135–145)

## 2014-09-10 LAB — CBC
HEMATOCRIT: 32.9 % — AB (ref 39.0–52.0)
Hemoglobin: 10.7 g/dL — ABNORMAL LOW (ref 13.0–17.0)
MCH: 29.1 pg (ref 26.0–34.0)
MCHC: 32.5 g/dL (ref 30.0–36.0)
MCV: 89.4 fL (ref 78.0–100.0)
Platelets: 124 10*3/uL — ABNORMAL LOW (ref 150–400)
RBC: 3.68 MIL/uL — AB (ref 4.22–5.81)
RDW: 13.8 % (ref 11.5–15.5)
WBC: 2.3 10*3/uL — ABNORMAL LOW (ref 4.0–10.5)

## 2014-09-10 LAB — OSMOLALITY, URINE: Osmolality, Ur: 462 mOsm/kg (ref 390–1090)

## 2014-09-10 LAB — VANCOMYCIN, RANDOM: Vancomycin Rm: 19.8 ug/mL

## 2014-09-10 MED ORDER — LINEZOLID 600 MG PO TABS
600.0000 mg | ORAL_TABLET | Freq: Two times a day (BID) | ORAL | Status: DC
  Administered 2014-09-10 – 2014-09-13 (×7): 600 mg via ORAL
  Filled 2014-09-10 (×8): qty 1

## 2014-09-10 NOTE — Progress Notes (Addendum)
[facility] PROGRESS NOTE Interim History: 69 year old male with recently diagnosed stage IIIB lung cancer presented to the emergency room for shortness of breath on exertion, PET scan performed on 08/14/2049 showed a hypermetabolic mass in the central right upper lobe consistent with primary bronchogenic carcinoma, in the ED he was started empirically on vancomycin and cefepime pulmonary was consulted and agree with IV antibiotics and consultation of radiation oncology. Despite of empiric antibiotics he continued to spike fevers.   Assessment/Plan: Acute respiratory failure with hypoxia in the setting of  Bronchogenic carcinoma of right lung and Postobstructive pneumonia: - Patient underwent last bronchoscopy on 08/12/2014. The final pathology results suspicious for malignancy. PET scan further performed on 08/14/2014 - findings consistent with primary bronchogenic carcinoma. - Started on empiric antibiotic Vanc and cefepime on 09/01/2014. Develop AKI, d/w ID rec to switch to linezolid. - Had simulation therapy 12/31 and RT 09/09/2014, Dr. Pablo Ledger recommended only a single fraction of radiation.  - Continue oxygen support with nasal cannula. - Continue DuoNeb every 8 hours scheduled.  AKI: - Worsening creatinine with an Vanc trough level. Hold vancomycin. - Also his hypokalemia and tree make an hypochloremic question prerenal etiology. - Check a urinary sodium and a urinary creatinine.  Anemia of chronic disease: - Hemoglobin has remained stable continue to monitor.  Leukopenia - most likely due to malignancy.  Essential hypertension: - cont Norvasc.  Hyponatremia: - Sodium level down to 129. Be met this pending, unclear etiology. - Check a urine osmolarity and serum osmolarity as well as a urinary sodium and creatinine.   Plasma cell dyscrasia - monitor.    Code Status: Full.  Family Communication: plan of care discussed with the patient Disposition Plan: Home  when stable.    Consultants: PCCM --> signed off 09/02/2014  Oncology, Dr. Earlie Server Radiation oncology, Dr. Thea Silversmith  Procedures: CXR Ct chest 1.6.2015  Antibiotics: Vancomycin 12/28 -->1.5.2016. Maxipime 12/28 --> linezolid 1.6.2015>>  HPI/Subjective: Relates his SOBis not improved.  Objective: Filed Vitals:   09/09/14 1524 09/09/14 2110 09/09/14 2256 09/10/14 0452  BP:  121/82  127/92  Pulse:  94  96  Temp: 100.2 F (37.9 C) 101.1 F (38.4 C) 101.7 F (38.7 C) 100.9 F (38.3 C)  TempSrc: Oral Oral Oral Oral  Resp:  20  20  Height:      Weight:      SpO2:  97%  95%    Intake/Output Summary (Last 24 hours) at 09/10/14 0842 Last data filed at 09/10/14 0700  Gross per 24 hour  Intake  882.5 ml  Output    600 ml  Net  282.5 ml   Filed Weights   09/01/14 0920 09/01/14 1643  Weight: 76.204 kg (168 lb) 76.204 kg (168 lb)    Exam:  General: Alert, awake, oriented x3, in no acute distress.  HEENT: No bruits, no goiter.  Heart: Regular rate and rhythm. Lungs: Good air movement, clear Abdomen: Soft, nontender, nondistended, positive bowel sounds.    Data Reviewed: Basic Metabolic Panel:  Recent Labs Lab 09/04/14 0830 09/06/14 0506 09/07/14 0500 09/09/14 0453 09/10/14 0420  NA 132* 133* 131* 129* 129*  K 3.9 3.9 3.8 3.8 3.9  CL 100 99 99 97 100  CO2 $Re'26 26 25 22 22  'FIQ$ GLUCOSE 114* 93 93 108* 95  BUN $Re'12 14 13 'lwo$ 25* 34*  CREATININE 1.18 1.15 1.07 1.66* 2.43*  CALCIUM 9.4 9.4 9.3 8.8 8.7   Liver Function Tests: No results for input(s): AST, ALT, ALKPHOS,  BILITOT, PROT, ALBUMIN in the last 168 hours. No results for input(s): LIPASE, AMYLASE in the last 168 hours. No results for input(s): AMMONIA in the last 168 hours. CBC:  Recent Labs Lab 09/06/14 0506 09/09/14 0453 09/10/14 0420  WBC 3.4* 2.4* 2.3*  HGB 12.2* 11.9* 10.7*  HCT 36.7* 36.1* 32.9*  MCV 88.4 88.0 89.4  PLT 227 155 124*   Cardiac Enzymes: No results for input(s): CKTOTAL,  CKMB, CKMBINDEX, TROPONINI in the last 168 hours. BNP (last 3 results)  Recent Labs  09/15/13 1410  PROBNP 120.1   CBG: No results for input(s): GLUCAP in the last 168 hours.  Recent Results (from the past 240 hour(s))  Culture, blood (routine x 2)     Status: None (Preliminary result)   Collection Time: 09/09/14  6:45 AM  Result Value Ref Range Status   Specimen Description BLOOD LEFT ANTECUBITAL  Final   Special Requests BOTTLES DRAWN AEROBIC AND ANAEROBIC 5CC  Final   Culture   Final           BLOOD CULTURE RECEIVED NO GROWTH TO DATE CULTURE WILL BE HELD FOR 5 DAYS BEFORE ISSUING A FINAL NEGATIVE REPORT Performed at [facility]    Report Status PENDING  Incomplete  Culture, blood (routine x 2)     Status: None (Preliminary result)   Collection Time: 09/09/14  6:50 AM  Result Value Ref Range Status   Specimen Description BLOOD LEFT HAND  Final   Special Requests BOTTLES DRAWN AEROBIC AND ANAEROBIC 5CC  Final   Culture   Final           BLOOD CULTURE RECEIVED NO GROWTH TO DATE CULTURE WILL BE HELD FOR 5 DAYS BEFORE ISSUING A FINAL NEGATIVE REPORT Performed at [facility]    Report Status PENDING  Incomplete     Studies: No results found.  Scheduled Meds: . amLODipine  10 mg Oral Daily  . ceFEPime (MAXIPIME) IV  1 g Intravenous Q24H  . enoxaparin (LOVENOX) injection  40 mg Subcutaneous Q24H  . feeding supplement (ENSURE COMPLETE)  237 mL Oral BID BM  . sodium chloride  3 mL Intravenous Q12H   Continuous Infusions: . sodium chloride 75 mL/hr at 09/09/14 2136     Charlynne Cousins  [facility] Pager [phone]. If 8PM-8AM, please contact night-coverage at [email or web address], password [facility] 09/10/2014, 8:42 AM  LOS: 9 days

## 2014-09-10 NOTE — Progress Notes (Signed)
Clinical Social Work  Patient was discussed during progression meeting and MD reports patient is not medically stable to DC. CSW updated [facility] who remains agreeable to accept once stable. CSW continues to wait for [name] to bring POA paperwork to verify he is HCPOA.  CSW will continue to follow.  [place], [place] [phone]

## 2014-09-10 NOTE — Progress Notes (Signed)
NUTRITION FOLLOW UP  Intervention:   -Continue Ensure Complete po BID, each supplement provides 350 kcal and 13 grams of protein (Pt likes over ice) -Encourage PO intake -RD to continue to monitor  Nutrition Dx:   Unintentional weight loss related to Stage IV lung cancer as evidenced by 9% weight loss x 2 months; ongoing  Goal:   Pt to meet >/= 90% of their estimated nutrition needs; not met  Monitor:   PO and supplemental intake, weight, labs, I/O's  Assessment:   69 year old male with a history of hypertension, recently diagnosed stage IIIB/4 lung cancer.   12/30: Pt denies any weight loss and states that his appetite is fine now and PTA. PO intake: 100%. Pt likes the Ensure supplements and would like to continue to receive them.  Per weight history documentation, pt has lost 17 lb since November (9% weight loss x 2 months, significant for time frame).  1/6: - Per family, pt is eating normally. He has been drinking Ensure supplements, but refused this am. Pt agreed to drink Ensure if it was over ice. He ate spaghetti and peaches for lunch (all of it). Will continue nutritional supplements.   Labs: Na low BUN elevated  Height: Ht Readings from Last 1 Encounters:  09/01/14 6' (1.829 m)    Weight Status:   Wt Readings from Last 1 Encounters:  09/01/14 168 lb (76.204 kg)    Re-estimated needs:  Kcal: 2200-2400 Protein: 105-115 g Fluid: 2.2 L/day  Skin: intact  Diet Order: Diet regular   Intake/Output Summary (Last 24 hours) at 09/10/14 1516 Last data filed at 09/10/14 1224  Gross per 24 hour  Intake 1102.5 ml  Output    600 ml  Net  502.5 ml    Last BM: 1/1   Labs:   Recent Labs Lab 09/07/14 0500 09/09/14 0453 09/10/14 0420  NA 131* 129* 129*  K 3.8 3.8 3.9  CL 99 97 100  CO2 $Re'25 22 22  'OLY$ BUN 13 25* 34*  CREATININE 1.07 1.66* 2.43*  CALCIUM 9.3 8.8 8.7  GLUCOSE 93 108* 95    CBG (last 3)  No results for input(s): GLUCAP in the last 72  hours.  Scheduled Meds: . amLODipine  10 mg Oral Daily  . ceFEPime (MAXIPIME) IV  1 g Intravenous Q24H  . enoxaparin (LOVENOX) injection  40 mg Subcutaneous Q24H  . feeding supplement (ENSURE COMPLETE)  237 mL Oral BID BM  . linezolid  600 mg Oral Q12H  . sodium chloride  3 mL Intravenous Q12H    Continuous Infusions:   Laurette Schimke MS, RD, LDN

## 2014-09-10 NOTE — Progress Notes (Signed)
PT Cancellation Note  Patient Details Name: Stephen Ellis MRN: [id number] DOB: 1946-06-30   Cancelled Treatment:    Reason Eval/Treat Not Completed: Patient declined, no reason specified   [facility] 09/10/2014, 3:24 PM

## 2014-09-11 ENCOUNTER — Ambulatory Visit: Payer: Medicare Other

## 2014-09-11 DIAGNOSIS — D72819 Decreased white blood cell count, unspecified: Secondary | ICD-10-CM

## 2014-09-11 LAB — BASIC METABOLIC PANEL
ANION GAP: 7 (ref 5–15)
BUN: 32 mg/dL — ABNORMAL HIGH (ref 6–23)
CO2: 21 mmol/L (ref 19–32)
Calcium: 8.6 mg/dL (ref 8.4–10.5)
Chloride: 104 mEq/L (ref 96–112)
Creatinine, Ser: 2.64 mg/dL — ABNORMAL HIGH (ref 0.50–1.35)
GFR calc Af Amer: 27 mL/min — ABNORMAL LOW (ref >90)
GFR calc non Af Amer: 24 mL/min — ABNORMAL LOW (ref >90)
GLUCOSE: 90 mg/dL (ref 70–99)
POTASSIUM: 3.8 mmol/L (ref 3.5–5.1)
Sodium: 132 mmol/L — ABNORMAL LOW (ref 135–145)

## 2014-09-11 LAB — SODIUM, URINE, RANDOM: Sodium, Ur: 57 mEq/L

## 2014-09-11 LAB — CREATININE, URINE, RANDOM: Creatinine, Urine: 180.8 mg/dL

## 2014-09-11 MED ORDER — SODIUM CHLORIDE 0.9 % IV SOLN
INTRAVENOUS | Status: DC
  Administered 2014-09-11: via INTRAVENOUS

## 2014-09-11 MED ORDER — ENOXAPARIN SODIUM 30 MG/0.3ML ~~LOC~~ SOLN
30.0000 mg | SUBCUTANEOUS | Status: DC
  Administered 2014-09-12 – 2014-09-13 (×2): 30 mg via SUBCUTANEOUS
  Filled 2014-09-11 (×2): qty 0.3

## 2014-09-11 MED ORDER — CEFPODOXIME PROXETIL 200 MG PO TABS
200.0000 mg | ORAL_TABLET | Freq: Two times a day (BID) | ORAL | Status: DC
  Administered 2014-09-11 (×2): 200 mg via ORAL
  Filled 2014-09-11 (×4): qty 1

## 2014-09-11 NOTE — Progress Notes (Addendum)
[facility] PROGRESS NOTE Interim History: 69 year old male with recently diagnosed stage IIIB lung cancer presented to the emergency room for shortness of breath on exertion, PET scan performed on 08/14/2049 showed a hypermetabolic mass in the central right upper lobe consistent with primary bronchogenic carcinoma, in the ED he was started empirically on vancomycin and cefepime pulmonary was consulted and agree with IV antibiotics and consultation of radiation oncology. Despite of empiric antibiotics he continued to spike fevers.   Assessment/Plan: Acute respiratory failure with hypoxia in the setting of  Bronchogenic carcinoma of right lung and Postobstructive pneumonia: - Patient underwent last bronchoscopy on 08/12/2014. The final pathology results suspicious for malignancy. PET scan further performed on 08/14/2014. Consistent with primary bronchogenic carcinoma. - Cont to have fever on vanc and develop AKI. Switch to linezolid and Vantin. Pt hasdefervece. - Had simulation therapy 12/31 and RT 09/09/2014, Dr. Pablo Ledger recommended only a single fraction of radiation.  - Continue oxygen support with nasal cannula. - Continue DuoNeb every 8 hours scheduled.  AKI: - Worsening creatinine with an Vanc trough level. Dc/d vancomycin. - Cont to monitor renal function. - Also his hypokalemia and tree make an hypochloremic question prerenal etiology. - Fena < 1%.  Anemia of chronic disease: - Hemoglobin has remained stable continue to monitor.  Leukopenia - most likely due to malignancy.  Essential hypertension: - cont Norvasc.  Hyponatremia: - Improving with hydration.   Plasma cell dyscrasia - monitor.    Code Status: Full.  Family Communication: plan of care discussed with the patient Disposition Plan: Home when stable.    Consultants: PCCM --> signed off 09/02/2014  Oncology, Dr. Earlie Server Radiation oncology, Dr. Thea Silversmith  Procedures: CXR Ct chest  1.6.2015  Antibiotics: Vancomycin 12/28 -->1.5.2016. Maxipime 12/28 --> linezolid 1.6.2015>>  HPI/Subjective: No complains.  Objective: Filed Vitals:   09/10/14 0452 09/10/14 1410 09/10/14 2150 09/11/14 0350  BP: 127/92 126/76 135/91 125/88  Pulse: 96 97 94 93  Temp: 100.9 F (38.3 C) 100.3 F (37.9 C) 99.7 F (37.6 C) 99.6 F (37.6 C)  TempSrc: Oral Oral Oral Oral  Resp: 20 16 20 18   Height:      Weight:      SpO2: 95% 95% 97% 95%    Intake/Output Summary (Last 24 hours) at 09/11/14 0734 Last data filed at 09/11/14 0700  Gross per 24 hour  Intake   2805 ml  Output    650 ml  Net   2155 ml   Filed Weights   09/01/14 0920 09/01/14 1643  Weight: 76.204 kg (168 lb) 76.204 kg (168 lb)    Exam:  General: Alert, awake, oriented x3, in no acute distress.  HEENT: No bruits, no goiter.  Heart: Regular rate and rhythm. Lungs: Good air movement, clear Abdomen: Soft, nontender, nondistended, positive bowel sounds.    Data Reviewed: Basic Metabolic Panel:  Recent Labs Lab 09/06/14 0506 09/07/14 0500 09/09/14 0453 09/10/14 0420 09/11/14 0500  NA 133* 131* 129* 129* 132*  K 3.9 3.8 3.8 3.9 3.8  CL 99 99 97 100 104  CO2 26 25 22 22 21   GLUCOSE 93 93 108* 95 90  BUN 14 13 25* 34* 32*  CREATININE 1.15 1.07 1.66* 2.43* 2.64*  CALCIUM 9.4 9.3 8.8 8.7 8.6   Liver Function Tests: No results for input(s): AST, ALT, ALKPHOS, BILITOT, PROT, ALBUMIN in the last 168 hours. No results for input(s): LIPASE, AMYLASE in the last 168 hours. No results for input(s): AMMONIA in the last 168  hours. CBC:  Recent Labs Lab 09/06/14 0506 09/09/14 0453 09/10/14 0420  WBC 3.4* 2.4* 2.3*  HGB 12.2* 11.9* 10.7*  HCT 36.7* 36.1* 32.9*  MCV 88.4 88.0 89.4  PLT 227 155 124*   Cardiac Enzymes: No results for input(s): CKTOTAL, CKMB, CKMBINDEX, TROPONINI in the last 168 hours. BNP (last 3 results)  Recent Labs  09/15/13 1410  PROBNP 120.1   CBG: No results for input(s):  GLUCAP in the last 168 hours.  Recent Results (from the past 240 hour(s))  Culture, blood (routine x 2)     Status: None (Preliminary result)   Collection Time: 09/09/14  6:45 AM  Result Value Ref Range Status   Specimen Description BLOOD LEFT ANTECUBITAL  Final   Special Requests BOTTLES DRAWN AEROBIC AND ANAEROBIC 5CC  Final   Culture   Final           BLOOD CULTURE RECEIVED NO GROWTH TO DATE CULTURE WILL BE HELD FOR 5 DAYS BEFORE ISSUING A FINAL NEGATIVE REPORT Performed at [facility]    Report Status PENDING  Incomplete  Culture, blood (routine x 2)     Status: None (Preliminary result)   Collection Time: 09/09/14  6:50 AM  Result Value Ref Range Status   Specimen Description BLOOD LEFT HAND  Final   Special Requests BOTTLES DRAWN AEROBIC AND ANAEROBIC 5CC  Final   Culture   Final           BLOOD CULTURE RECEIVED NO GROWTH TO DATE CULTURE WILL BE HELD FOR 5 DAYS BEFORE ISSUING A FINAL NEGATIVE REPORT Performed at [facility]    Report Status PENDING  Incomplete     Studies: Ct Chest Wo Contrast  09/10/2014   CLINICAL DATA:  History of lung carcinoma with shortness of Breath  EXAM: CT CHEST WITHOUT CONTRAST  TECHNIQUE: Multidetector CT imaging of the chest was performed following the standard protocol without IV contrast.  COMPARISON:  08/14/2014, 09/01/2014  FINDINGS: The left lung is again well aerated without focal infiltrate or sizable effusion. Persistent central right hilar mass lesion with bronchial occlusion is noted. Collapse of the right upper lobe is seen and relatively stable. Some additional peripheral atelectasis is noted in the right upper lobe which was not present on the prior exam. Some atelectatic changes are again seen in the right lower lobe also present on the prior exam but mildly increased. A new right-sided pleural effusion of a mild-to-moderate degree is noted. Soft tissue density is noted within the bronchus intermedius and right lower lobe  bronchus which may be simply related to mucous although the possibility of in growth would deserve some consideration. There is near complete occlusion of the lower lobe bronchus at this level. These changes however are stable from the prior exam.  Stable subcarinal and left hilar adenopathy is noted when compare with the prior exam.  Scanning into the upper abdomen reveals no acute abnormality. The osseous structures show degenerative change of the thoracic spine. No definitive bony metastatic lesions are seen.  IMPRESSION: Centrally obstructing right hilar mass with peripheral right lobe collapse and atelectasis.  Changes involving the lower lobe bronchus on the right which are stable from the prior exam with some degree of right lower lobe atelectasis.  New mild to moderate right pleural effusion.  No other acute abnormality is seen.   Electronically Signed   By: Inez Catalina M.D.   On: 09/10/2014 11:19   Mr Brain Wo Contrast  09/10/2014  CLINICAL DATA:  69 year old male with current history of lung cancer and recent onset altered mental status.  Renal insufficiency precludes post-contrast imaging at this time.  Subsequent encounter.  EXAM: MRI HEAD WITHOUT CONTRAST  TECHNIQUE: Multiplanar, multiecho pulse sequences of the brain and surrounding structures were obtained without intravenous contrast.  COMPARISON:  Head CT without contrast 1/11/ 2015, and earlier.  FINDINGS: Cerebral volume is not significantly changed. Major intracranial vascular flow voids are preserved. No midline shift, mass effect, or evidence of intracranial mass lesion.  Extensive signal changes most compatible with chronic small vessel disease, including multiple cerebral white matter and deep gray matter nuclei chronic lacunar infarcts as well as confluent bilateral cerebral white matter T2 and FLAIR hyperintensity. Multiple chronic lacunar infarcts in the brainstem, mostly the pons. Multiple chronic lacunar infarcts in both cerebellar  hemispheres. Occasional chronic micro hemorrhages in the brain. No restricted diffusion or evidence of acute infarction.  Negative pituitary and cervicomedullary junction. Grossly negative visualized cervical spine. Bone marrow signal is mildly heterogeneous but overall within normal limits. Visible internal auditory structures appear normal. Mastoids are clear. Trace paranasal sinus mucosal thickening. Postoperative changes to the left globe, otherwise negative orbits soft tissues. Visualized scalp soft tissues are within normal limits.  IMPRESSION: 1. No evidence of acute or metastatic intracranial abnormality on this noncontrast exam. 2. Advanced chronic small vessel disease, including numerous chronic lacunar infarcts and occasional chronic micro hemorrhages in the brain.   Electronically Signed   By: Lars Pinks M.D.   On: 09/10/2014 13:17    Scheduled Meds: . amLODipine  10 mg Oral Daily  . ceFEPime (MAXIPIME) IV  1 g Intravenous Q24H  . enoxaparin (LOVENOX) injection  40 mg Subcutaneous Q24H  . feeding supplement (ENSURE COMPLETE)  237 mL Oral BID BM  . linezolid  600 mg Oral Q12H  . sodium chloride  3 mL Intravenous Q12H   Continuous Infusions:     Charlynne Cousins  [facility] Pager [phone]. If 8PM-8AM, please contact night-coverage at [email or web address], password [facility] 09/11/2014, 7:34 AM  LOS: 10 days

## 2014-09-11 NOTE — Progress Notes (Signed)
Physical Therapy Treatment Patient Details Name: Stephen Ellis MRN: [id number] DOB: 07-19-1946 Today's Date: 09/11/2014    History of Present Illness 69 year old male adm 12/28 one-week history of worsening shortness of breath.  PMHx: hypertension, recently diagnosed stage IIIB/4 lung cancer  PET scan was performed on 08/14/2014 and it showed Hypermetabolic mass in the central right upper lobe and involving the right hilum, consistent with primary bronchogenic carcinoma. Postobstructive right upper lobe collapse noted.     PT Comments    Pt progressing, incr gait distance today, does fatigue quickly--chair to pt for safety; will benefit from SNF  Follow Up Recommendations  SNF;Supervision for mobility/OOB     Equipment Recommendations  None recommended by PT    Recommendations for Other Services OT consult     Precautions / Restrictions Precautions Precautions: Fall    Mobility  Bed Mobility Overal bed mobility: Needs Assistance Bed Mobility: Supine to Sit     Supine to sit: Min assist;Min guard     General bed mobility comments: min/guard d/t decr trunk control  Transfers Overall transfer level: Needs assistance Equipment used: Rolling walker (2 wheeled) Transfers: Sit to/from Stand Sit to Stand: Min assist         General transfer comment: assist with anterior superior wt shift and initial static stand balance, cues for safeaty, hand placement and control of descent  Ambulation/Gait Ambulation/Gait assistance: Min assist;Mod assist Ambulation Distance (Feet): 60 Feet Assistive device: Rolling walker (2 wheeled) Gait Pattern/deviations: Step-through pattern;Step-to pattern;Decreased stride length;Wide base of support;Drifts right/left;Shuffle   Gait velocity interpretation: Below normal speed for age/gender General Gait Details: multi-modal cues for RW position, to keep both feet inisde RW and incr step length   Stairs            Wheelchair Mobility    Modified Rankin (Stroke Patients Only)       Balance             Standing balance-Leahy Scale: Poor                      Cognition Arousal/Alertness: Awake/alert Behavior During Therapy: WFL for tasks assessed/performed Overall Cognitive Status: No family/caregiver present to determine baseline cognitive functioning                      Exercises      General Comments        Pertinent Vitals/Pain Pain Assessment: No/denies pain    Home Living Family/patient expects to be discharged to:: Skilled nursing facility Living Arrangements: Non-relatives/Friends                  Prior Function            PT Goals (current goals can now be found in the care plan section) Acute Rehab PT Goals Patient Stated Goal: to go to rehab PT Goal Formulation: With patient Time For Goal Achievement: 09/22/14 Potential to Achieve Goals: Good Progress towards PT goals: Progressing toward goals    Frequency  Min 3X/week    PT Plan Current plan remains appropriate    Co-evaluation             End of Session Equipment Utilized During Treatment: Gait belt Activity Tolerance: Patient tolerated treatment well Patient left: in chair;with call bell/phone within reach;with nursing/sitter in room     Time: 1007-1022 PT Time Calculation (min) (ACUTE ONLY): 15 min  Charges:  $Gait Training: 8-22 mins  G CodesKenyon Ana 09/11/2014, 10:36 AM

## 2014-09-11 NOTE — Progress Notes (Signed)
Clinical Social Work  Patient was discussed during progression meeting and MD reports that patient should be medically stable tomorrow. CSW updated [facility] who remains agreeable to accept when stable. CSW updated son Randall Hiss) that [name] did bring POA paperwork which was placed on patient's chart. CSW made son aware of possible DC tomorrow to [facility] and son is agreeable. CSW will continue to follow to assist with DC planning.  [place], [place] [phone]

## 2014-09-12 ENCOUNTER — Ambulatory Visit (HOSPITAL_COMMUNITY): Payer: Medicare Other

## 2014-09-12 ENCOUNTER — Ambulatory Visit: Payer: Medicare Other

## 2014-09-12 ENCOUNTER — Other Ambulatory Visit: Payer: Self-pay | Admitting: *Deleted

## 2014-09-12 LAB — BASIC METABOLIC PANEL
ANION GAP: 8 (ref 5–15)
BUN: 26 mg/dL — ABNORMAL HIGH (ref 6–23)
CO2: 22 mmol/L (ref 19–32)
Calcium: 8.9 mg/dL (ref 8.4–10.5)
Chloride: 104 mEq/L (ref 96–112)
Creatinine, Ser: 2.39 mg/dL — ABNORMAL HIGH (ref 0.50–1.35)
GFR calc Af Amer: 31 mL/min — ABNORMAL LOW (ref >90)
GFR, EST NON AFRICAN AMERICAN: 27 mL/min — AB (ref >90)
Glucose, Bld: 87 mg/dL (ref 70–99)
POTASSIUM: 3.9 mmol/L (ref 3.5–5.1)
SODIUM: 134 mmol/L — AB (ref 135–145)

## 2014-09-12 MED ORDER — SODIUM CHLORIDE 0.9 % IV SOLN
INTRAVENOUS | Status: DC
Start: 2014-09-12 — End: 2014-09-12

## 2014-09-12 MED ORDER — SODIUM CHLORIDE 0.9 % IV SOLN: INTRAVENOUS | Status: AC

## 2014-09-12 MED ORDER — LINEZOLID 600 MG PO TABS: 600.0000 mg | ORAL_TABLET | Freq: Two times a day (BID) | ORAL | Status: DC

## 2014-09-12 NOTE — Discharge Summary (Addendum)
Physician Discharge Summary  Stephen Ellis IHK:[id number] DOB: 1946/01/22 DOA: 09/01/2014  PCP: Elizabeth Palau, MD  Admit date: 09/01/2014 Discharge date: 09/16/2014  Time spent: 35 minutes  Recommendations for Outpatient Follow-up:  1. Follow up at SNF. 2. Follow up with PCP in 2 weeks.  Discharge Diagnoses:  Principal Problem:   Acute respiratory failure with hypoxia Active Problems:   Plasma cell dyscrasia   Postobstructive pneumonia   HCAP (healthcare-associated pneumonia)   Essential hypertension   Bronchogenic carcinoma of right lung   Anemia of chronic disease   Leukopenia   Pleural effusion, right   Discharge Condition: stable  Diet recommendation: heart healthy  Filed Weights   09/01/14 0920 09/01/14 1643  Weight: 76.204 kg (168 lb) 76.204 kg (168 lb)    History of present illness:  69 year old male with a history of hypertension, recently diagnosed stage IIIB/4 lung cancer (Dr. Julien Nordmann) presents with one-week history of worsening shortness of breath. The patient denies any fevers, chills, coughing, hemoptysis, nausea, vomiting, diarrhea, abdominal pain. His biggest complaint is worsening dyspnea and dyspnea on exertion. He is due to start a course of chemotherapy on 09/08/2014. He was recently discharged from the hospital on 08/09/2014 after being treated for postobstructive pneumonia. He had a recent CT antrum of the chest on 07/16/2014 which was negative for pulmonary embolus, but that CT suggested a right upper lobe mass. The patient was seen by Dr. Melvyn Novas and underwent a video flexible fiberoptic bronchoscopy on 07/29/2014. The final pathology was not conclusive for malignancy. On 08/12/2014 The patient was seen by Dr. Alva Garnet and he underwent repeat bronchoscopy which showed subtotal obstruction of the right upper lobe bronchus with mucosal infiltration involving the distal trachea and main carina. Transbronchial biopsies as well as brushings and endobronchial  biopsies were performed.  The final pathology (Accession: [id number]) showed minute fragments of highly atypical epithelioid cells suspicious for malignancy. PET scan was performed on 08/14/2014 and it showed Hypermetabolic mass in the central right upper lobe and involving the right hilum, consistent with primary bronchogenic carcinoma. Postobstructive right upper lobe collapse noted. he quit smoking 1 year ago after approximately 50 pack years. In emergency department, the patient received vancomycin and cefepime. He was afebrile and hemodynamically stable. Oxygen saturation was 95% on 2 L.   Hospital Course:  Acute respiratory failure with hypoxia in the setting of Bronchogenic carcinoma of right lung and Postobstructive pneumonia: - Patient underwent last bronchoscopy on 08/12/2014. The final pathology results suspicious for malignancy. PET scan further performed on 08/14/2014 - findings consistent with primary bronchogenic carcinoma. - Started on empiric antibiotic Vanc and cefepime on 09/01/2014. Develop AKI, d/w ID rec to switch to linezolid. - Once the pt became afebrile he was d/c to faqcility. - Had simulation therapy 12/31 and RT 09/09/2014, Dr. Pablo Ledger recommended only a single fraction of radiation.  - Continue oxygen support with nasal cannula. - Continue DuoNeb every 8 hours scheduled.  AKI: - Worsening creatinine with an Vanc trough level. D/c vancomycin. - He was also hypokalemia and  hypochloremic question prerenal etiology. - with Fena < 1% - treated with mantainance fluids and cr improved, will need a b-met in 1 week.  Anemia of chronic disease: - Hemoglobin has remained stable continue to monitor.  Leukopenia - most likely due to malignancy.  Essential hypertension: - cont Norvasc.  Hyponatremia: - most likely due top hypovolemia. - improved with hydration.  Plasma cell dyscrasia - Follow up with oncology.   Procedures:  Ct  chest  Consultations:  none  Discharge Exam: Filed Vitals:   09/13/14 0514  BP: 138/90  Pulse: 74  Temp: 98.2 F (36.8 C)  Resp: 20    General: A&O x1 Cardiovascular: RRR Respiratory: good air movement CTA B/L  Discharge Instructions   Discharge Instructions    Diet - low sodium heart healthy    Complete by:  As directed      Increase activity slowly    Complete by:  As directed           Discharge Medication List as of 09/13/2014 11:10 AM    START taking these medications   Details  linezolid (ZYVOX) 600 MG tablet Take 1 tablet (600 mg total) by mouth every 12 (twelve) hours., Starting 09/12/2014, Until Discontinued, Print      CONTINUE these medications which have NOT CHANGED   Details  acetaminophen-codeine (TYLENOL #3) 300-30 MG per tablet Take 1 tablet by mouth every 4 (four) hours as needed for moderate pain. q 4h prn, Starting 07/25/2014, Until Discontinued, Historical Med    amLODipine (NORVASC) 10 MG tablet Take 10 mg by mouth daily., Until Discontinued, Historical Med    budesonide-formoterol (SYMBICORT) 160-4.5 MCG/ACT inhaler Inhale 2 puffs into the lungs 2 (two) times daily., Starting 08/09/2014, Until Discontinued, Print    ipratropium-albuterol (DUONEB) 0.5-2.5 (3) MG/3ML SOLN Take 3 mLs by nebulization every 6 (six) hours., Starting 08/09/2014, Until Discontinued, Print      STOP taking these medications     amoxicillin-clavulanate (AUGMENTIN) 875-125 MG per tablet        No Known Allergies    The results of significant diagnostics from this hospitalization (including imaging, microbiology, ancillary and laboratory) are listed below for reference.    Significant Diagnostic Studies: Dg Chest 2 View  09/01/2014   CLINICAL DATA:  Shortness of breath  EXAM: CHEST  2 VIEW  COMPARISON:  08/07/2014  FINDINGS: Progressive collapse of the right lung, completely involving the right upper and middle lobes. There is opacity within the aerated right lower  lobe, compatible with postobstructive changes. A known right perihilar mass is difficult to visualize due to the extensive parenchymal opacity. The left lung is well aerated. Heart size is normal.  IMPRESSION: Lung cancer with progressive right lung collapse, now involving the right upper and middle lobes. There is postobstructive pneumonia or pneumonitis in the right lower lobe.   Electronically Signed   By: Jorje Guild M.D.   On: 09/01/2014 04:19   Ct Chest Wo Contrast  09/10/2014   CLINICAL DATA:  History of lung carcinoma with shortness of Breath  EXAM: CT CHEST WITHOUT CONTRAST  TECHNIQUE: Multidetector CT imaging of the chest was performed following the standard protocol without IV contrast.  COMPARISON:  08/14/2014, 09/01/2014  FINDINGS: The left lung is again well aerated without focal infiltrate or sizable effusion. Persistent central right hilar mass lesion with bronchial occlusion is noted. Collapse of the right upper lobe is seen and relatively stable. Some additional peripheral atelectasis is noted in the right upper lobe which was not present on the prior exam. Some atelectatic changes are again seen in the right lower lobe also present on the prior exam but mildly increased. A new right-sided pleural effusion of a mild-to-moderate degree is noted. Soft tissue density is noted within the bronchus intermedius and right lower lobe bronchus which may be simply related to mucous although the possibility of in growth would deserve some consideration. There is near complete occlusion of the lower lobe bronchus at  this level. These changes however are stable from the prior exam.  Stable subcarinal and left hilar adenopathy is noted when compare with the prior exam.  Scanning into the upper abdomen reveals no acute abnormality. The osseous structures show degenerative change of the thoracic spine. No definitive bony metastatic lesions are seen.  IMPRESSION: Centrally obstructing right hilar mass with  peripheral right lobe collapse and atelectasis.  Changes involving the lower lobe bronchus on the right which are stable from the prior exam with some degree of right lower lobe atelectasis.  New mild to moderate right pleural effusion.  No other acute abnormality is seen.   Electronically Signed   By: Inez Catalina M.D.   On: 09/10/2014 11:19   Mr Brain Wo Contrast  09/10/2014   CLINICAL DATA:  69 year old male with current history of lung cancer and recent onset altered mental status.  Renal insufficiency precludes post-contrast imaging at this time.  Subsequent encounter.  EXAM: MRI HEAD WITHOUT CONTRAST  TECHNIQUE: Multiplanar, multiecho pulse sequences of the brain and surrounding structures were obtained without intravenous contrast.  COMPARISON:  Head CT without contrast 1/11/ 2015, and earlier.  FINDINGS: Cerebral volume is not significantly changed. Major intracranial vascular flow voids are preserved. No midline shift, mass effect, or evidence of intracranial mass lesion.  Extensive signal changes most compatible with chronic small vessel disease, including multiple cerebral white matter and deep gray matter nuclei chronic lacunar infarcts as well as confluent bilateral cerebral white matter T2 and FLAIR hyperintensity. Multiple chronic lacunar infarcts in the brainstem, mostly the pons. Multiple chronic lacunar infarcts in both cerebellar hemispheres. Occasional chronic micro hemorrhages in the brain. No restricted diffusion or evidence of acute infarction.  Negative pituitary and cervicomedullary junction. Grossly negative visualized cervical spine. Bone marrow signal is mildly heterogeneous but overall within normal limits. Visible internal auditory structures appear normal. Mastoids are clear. Trace paranasal sinus mucosal thickening. Postoperative changes to the left globe, otherwise negative orbits soft tissues. Visualized scalp soft tissues are within normal limits.  IMPRESSION: 1. No evidence of  acute or metastatic intracranial abnormality on this noncontrast exam. 2. Advanced chronic small vessel disease, including numerous chronic lacunar infarcts and occasional chronic micro hemorrhages in the brain.   Electronically Signed   By: Lars Pinks M.D.   On: 09/10/2014 13:17    Microbiology: Recent Results (from the past 240 hour(s))  Culture, blood (routine x 2)     Status: None   Collection Time: 09/09/14  6:45 AM  Result Value Ref Range Status   Specimen Description BLOOD LEFT ANTECUBITAL  Final   Special Requests BOTTLES DRAWN AEROBIC AND ANAEROBIC 5CC  Final   Culture   Final    NO GROWTH 5 DAYS Performed at [facility]    Report Status 09/15/2014 FINAL  Final  Culture, blood (routine x 2)     Status: None   Collection Time: 09/09/14  6:50 AM  Result Value Ref Range Status   Specimen Description BLOOD LEFT HAND  Final   Special Requests BOTTLES DRAWN AEROBIC AND ANAEROBIC 5CC  Final   Culture   Final    NO GROWTH 5 DAYS Performed at [facility]    Report Status 09/15/2014 FINAL  Final     Labs: Basic Metabolic Panel:  Recent Labs Lab 09/10/14 0420 09/11/14 0500 09/12/14 0442 09/13/14 0523  NA 129* 132* 134* 134*  K 3.9 3.8 3.9 3.8  CL 100 104 104 104  CO2 22 21 22  22  GLUCOSE 95 90 87 90  BUN 34* 32* 26* 22  CREATININE 2.43* 2.64* 2.39* 2.13*  CALCIUM 8.7 8.6 8.9 8.7   Liver Function Tests: No results for input(s): AST, ALT, ALKPHOS, BILITOT, PROT, ALBUMIN in the last 168 hours. No results for input(s): LIPASE, AMYLASE in the last 168 hours. No results for input(s): AMMONIA in the last 168 hours. CBC:  Recent Labs Lab 09/10/14 0420  WBC 2.3*  HGB 10.7*  HCT 32.9*  MCV 89.4  PLT 124*   Cardiac Enzymes: No results for input(s): CKTOTAL, CKMB, CKMBINDEX, TROPONINI in the last 168 hours. BNP: BNP (last 3 results) No results for input(s): PROBNP in the last 8760 hours. CBG: No results for input(s): GLUCAP in the last 168  hours.   Signed:  Charlynne Cousins  [facility] 09/16/2014, 5:39 PM

## 2014-09-12 NOTE — Progress Notes (Signed)
Clinical Social Work  Patient was discussed during progression meeting and MD reports that patient should be ready to DC on 09/13/14 to SNF. Preliminary DC summary sent to SNF and they are aware that patient will be on Zyvox. CSW spoke with admissions cooridnator who reports this medication will not be a barrier to DC. CSW will leave report for weekend CSW for DC planning. CSW to call [phone] when patient is medically stable. Signed FL2 in chart for DC.   CSW informed POA [name]) of DC and he will assist with paperwork at [facility]. CSW left a message with son Randall Hiss) as well to update him on DC plans.  CSW will continue to follow.  [place], [place] [phone]

## 2014-09-12 NOTE — Progress Notes (Signed)
[facility] PROGRESS NOTE Interim History: 69 year old male with recently diagnosed stage IIIB lung cancer presented to the emergency room for shortness of breath on exertion, PET scan performed on 08/14/2049 showed a hypermetabolic mass in the central right upper lobe consistent with primary bronchogenic carcinoma, in the ED he was started empirically on vancomycin and cefepime pulmonary was consulted and agree with IV antibiotics and consultation of radiation oncology. Despite of empiric antibiotics he continued to spike fevers.   Assessment/Plan: Acute respiratory failure with hypoxia in the setting of  Bronchogenic carcinoma of right lung and Postobstructive pneumonia: - Patient underwent last bronchoscopy on 08/12/2014. The final pathology results suspicious for malignancy. PET scan further performed on 08/14/2014. Consistent with primary bronchogenic carcinoma. - Cont to have fever on vanc and develop AKI. Switch to linezolid and Vantin. Pt hasdefervece. - Had simulation therapy 12/31 and RT 09/09/2014, Dr. Pablo Ledger recommended only a single fraction of radiation.  - Continue oxygen support with nasal cannula. - Continue DuoNeb every 8 hours scheduled.  AKI: - Cr has plateau. Cont IV fluids check a b-me tin am. - Cont to monitor renal function. - Fena < 1%.  Anemia of chronic disease: - Hemoglobin has remained stable continue to monitor.  Leukopenia - most likely due to malignancy.  Essential hypertension: - cont Norvasc.  Hyponatremia: - Improving with hydration.   Plasma cell dyscrasia - monitor.    Code Status: Full.  Family Communication: plan of care discussed with the patient Disposition Plan: Home when stable.    Consultants: PCCM --> signed off 09/02/2014  Oncology, Dr. Earlie Server Radiation oncology, Dr. Thea Silversmith  Procedures: CXR Ct chest 1.6.2015  Antibiotics: Vancomycin 12/28 -->1.5.2016. Maxipime 12/28 -->1.8.2016 linezolid  1.6.2015>>  HPI/Subjective: No complains.  Objective: Filed Vitals:   09/11/14 0350 09/11/14 1329 09/11/14 2242 09/12/14 0609  BP: 125/88 123/88 121/91 131/84  Pulse: 93 88 106 79  Temp: 99.6 F (37.6 C) 99 F (37.2 C) 98.9 F (37.2 C) 98.4 F (36.9 C)  TempSrc: Oral Oral Oral Oral  Resp: 18 18 18 18   Height:      Weight:      SpO2: 95% 97% 98% 98%    Intake/Output Summary (Last 24 hours) at 09/12/14 0923 Last data filed at 09/12/14 0900  Gross per 24 hour  Intake    120 ml  Output   1150 ml  Net  -1030 ml   Filed Weights   09/01/14 0920 09/01/14 1643  Weight: 76.204 kg (168 lb) 76.204 kg (168 lb)    Exam:  General: Alert, awake, oriented x3, in no acute distress.  HEENT: No bruits, no goiter.  Heart: Regular rate and rhythm. Lungs: Good air movement, clear Abdomen: Soft, nontender, nondistended, positive bowel sounds.    Data Reviewed: Basic Metabolic Panel:  Recent Labs Lab 09/07/14 0500 09/09/14 0453 09/10/14 0420 09/11/14 0500 09/12/14 0442  NA 131* 129* 129* 132* 134*  K 3.8 3.8 3.9 3.8 3.9  CL 99 97 100 104 104  CO2 25 22 22 21 22   GLUCOSE 93 108* 95 90 87  BUN 13 25* 34* 32* 26*  CREATININE 1.07 1.66* 2.43* 2.64* 2.39*  CALCIUM 9.3 8.8 8.7 8.6 8.9   Liver Function Tests: No results for input(s): AST, ALT, ALKPHOS, BILITOT, PROT, ALBUMIN in the last 168 hours. No results for input(s): LIPASE, AMYLASE in the last 168 hours. No results for input(s): AMMONIA in the last 168 hours. CBC:  Recent Labs Lab 09/06/14 0506 09/09/14 0453 09/10/14  0420  WBC 3.4* 2.4* 2.3*  HGB 12.2* 11.9* 10.7*  HCT 36.7* 36.1* 32.9*  MCV 88.4 88.0 89.4  PLT 227 155 124*   Cardiac Enzymes: No results for input(s): CKTOTAL, CKMB, CKMBINDEX, TROPONINI in the last 168 hours. BNP (last 3 results)  Recent Labs  09/15/13 1410  PROBNP 120.1   CBG: No results for input(s): GLUCAP in the last 168 hours.  Recent Results (from the past 240 hour(s))  Culture,  blood (routine x 2)     Status: None (Preliminary result)   Collection Time: 09/09/14  6:45 AM  Result Value Ref Range Status   Specimen Description BLOOD LEFT ANTECUBITAL  Final   Special Requests BOTTLES DRAWN AEROBIC AND ANAEROBIC 5CC  Final   Culture   Final           BLOOD CULTURE RECEIVED NO GROWTH TO DATE CULTURE WILL BE HELD FOR 5 DAYS BEFORE ISSUING A FINAL NEGATIVE REPORT Performed at [facility]    Report Status PENDING  Incomplete  Culture, blood (routine x 2)     Status: None (Preliminary result)   Collection Time: 09/09/14  6:50 AM  Result Value Ref Range Status   Specimen Description BLOOD LEFT HAND  Final   Special Requests BOTTLES DRAWN AEROBIC AND ANAEROBIC 5CC  Final   Culture   Final           BLOOD CULTURE RECEIVED NO GROWTH TO DATE CULTURE WILL BE HELD FOR 5 DAYS BEFORE ISSUING A FINAL NEGATIVE REPORT Performed at [facility]    Report Status PENDING  Incomplete     Studies: Ct Chest Wo Contrast  09/10/2014   CLINICAL DATA:  History of lung carcinoma with shortness of Breath  EXAM: CT CHEST WITHOUT CONTRAST  TECHNIQUE: Multidetector CT imaging of the chest was performed following the standard protocol without IV contrast.  COMPARISON:  08/14/2014, 09/01/2014  FINDINGS: The left lung is again well aerated without focal infiltrate or sizable effusion. Persistent central right hilar mass lesion with bronchial occlusion is noted. Collapse of the right upper lobe is seen and relatively stable. Some additional peripheral atelectasis is noted in the right upper lobe which was not present on the prior exam. Some atelectatic changes are again seen in the right lower lobe also present on the prior exam but mildly increased. A new right-sided pleural effusion of a mild-to-moderate degree is noted. Soft tissue density is noted within the bronchus intermedius and right lower lobe bronchus which may be simply related to mucous although the possibility of in growth  would deserve some consideration. There is near complete occlusion of the lower lobe bronchus at this level. These changes however are stable from the prior exam.  Stable subcarinal and left hilar adenopathy is noted when compare with the prior exam.  Scanning into the upper abdomen reveals no acute abnormality. The osseous structures show degenerative change of the thoracic spine. No definitive bony metastatic lesions are seen.  IMPRESSION: Centrally obstructing right hilar mass with peripheral right lobe collapse and atelectasis.  Changes involving the lower lobe bronchus on the right which are stable from the prior exam with some degree of right lower lobe atelectasis.  New mild to moderate right pleural effusion.  No other acute abnormality is seen.   Electronically Signed   By: Inez Catalina M.D.   On: 09/10/2014 11:19   Mr Brain Wo Contrast  09/10/2014   CLINICAL DATA:  69 year old male with current history of lung cancer  and recent onset altered mental status.  Renal insufficiency precludes post-contrast imaging at this time.  Subsequent encounter.  EXAM: MRI HEAD WITHOUT CONTRAST  TECHNIQUE: Multiplanar, multiecho pulse sequences of the brain and surrounding structures were obtained without intravenous contrast.  COMPARISON:  Head CT without contrast 1/11/ 2015, and earlier.  FINDINGS: Cerebral volume is not significantly changed. Major intracranial vascular flow voids are preserved. No midline shift, mass effect, or evidence of intracranial mass lesion.  Extensive signal changes most compatible with chronic small vessel disease, including multiple cerebral white matter and deep gray matter nuclei chronic lacunar infarcts as well as confluent bilateral cerebral white matter T2 and FLAIR hyperintensity. Multiple chronic lacunar infarcts in the brainstem, mostly the pons. Multiple chronic lacunar infarcts in both cerebellar hemispheres. Occasional chronic micro hemorrhages in the brain. No restricted  diffusion or evidence of acute infarction.  Negative pituitary and cervicomedullary junction. Grossly negative visualized cervical spine. Bone marrow signal is mildly heterogeneous but overall within normal limits. Visible internal auditory structures appear normal. Mastoids are clear. Trace paranasal sinus mucosal thickening. Postoperative changes to the left globe, otherwise negative orbits soft tissues. Visualized scalp soft tissues are within normal limits.  IMPRESSION: 1. No evidence of acute or metastatic intracranial abnormality on this noncontrast exam. 2. Advanced chronic small vessel disease, including numerous chronic lacunar infarcts and occasional chronic micro hemorrhages in the brain.   Electronically Signed   By: Lars Pinks M.D.   On: 09/10/2014 13:17    Scheduled Meds: . amLODipine  10 mg Oral Daily  . cefpodoxime  200 mg Oral Q12H  . enoxaparin (LOVENOX) injection  30 mg Subcutaneous Q24H  . feeding supplement (ENSURE COMPLETE)  237 mL Oral BID BM  . linezolid  600 mg Oral Q12H  . sodium chloride  3 mL Intravenous Q12H   Continuous Infusions: . sodium chloride       Charlynne Cousins  [facility] Pager [phone]. If 8PM-8AM, please contact night-coverage at [email or web address], password [facility] 09/12/2014, 9:23 AM  LOS: 11 days

## 2014-09-13 LAB — BASIC METABOLIC PANEL
Anion gap: 8 (ref 5–15)
BUN: 22 mg/dL (ref 6–23)
CALCIUM: 8.7 mg/dL (ref 8.4–10.5)
CO2: 22 mmol/L (ref 19–32)
Chloride: 104 mEq/L (ref 96–112)
Creatinine, Ser: 2.13 mg/dL — ABNORMAL HIGH (ref 0.50–1.35)
GFR calc non Af Amer: 31 mL/min — ABNORMAL LOW (ref >90)
GFR, EST AFRICAN AMERICAN: 35 mL/min — AB (ref >90)
GLUCOSE: 90 mg/dL (ref 70–99)
Potassium: 3.8 mmol/L (ref 3.5–5.1)
Sodium: 134 mmol/L — ABNORMAL LOW (ref 135–145)

## 2014-09-13 NOTE — Clinical Social Work Note (Addendum)
CSW received call from MD that patient was ready for discharge  CSW called and spoke with [facility] who are aware of patient being dischaged today  CSW prepared discharge packet and gave to RN  CSW met with family and pt at bedside and let them know that pt would be discharging to [place] also called [place] and spoke with him about discharge  CSW called transport for pt  No further CSW needs  CSW signing off  .Dede Query, LCSW [facility] Clinical Social Worker - Weekend Coverage cell #: [phone]

## 2014-09-13 NOTE — Care Management (Signed)
CARE MANAGEMENT NOTE 09/13/2014  Patient:  Stephen Ellis, Stephen Ellis   Account Number:  [id number]  Date Initiated:  09/13/2014  Documentation initiated by:  Apolonio Schneiders  Subjective/Objective Assessment:   Dyspnea     Action/Plan:   Anticipated DC Date:  09/13/2014   Anticipated DC Plan:  SKILLED NURSING FACILITY         Choice offered to / List presented to:             Status of service:   Medicare Important Message given?  YES (If response is "NO", the following Medicare IM given date fields will be blank) Date Medicare IM given:  09/13/2014 Medicare IM given by:  Apolonio Schneiders Date Additional Medicare IM given:   Additional Medicare IM given by:    Discharge Disposition:  [place]  Per UR Regulation:    If discussed at Long Length of Stay Meetings, dates discussed:    Comments:  09/13/14 11:00 - IM given to patient. Presenter, broadcasting BSN CCM

## 2014-09-13 NOTE — Progress Notes (Signed)
Stephen Ellis to be D/C'd Skilled nursing facility per MD order.      Medication List    STOP taking these medications        amoxicillin-clavulanate 875-125 MG per tablet  Commonly known as:  AUGMENTIN      TAKE these medications        acetaminophen-codeine 300-30 MG per tablet  Commonly known as:  TYLENOL #3  Take 1 tablet by mouth every 4 (four) hours as needed for moderate pain. q 4h prn     amLODipine 10 MG tablet  Commonly known as:  NORVASC  Take 10 mg by mouth daily.     budesonide-formoterol 160-4.5 MCG/ACT inhaler  Commonly known as:  SYMBICORT  Inhale 2 puffs into the lungs 2 (two) times daily.     ipratropium-albuterol 0.5-2.5 (3) MG/3ML Soln  Commonly known as:  DUONEB  Take 3 mLs by nebulization every 6 (six) hours.     linezolid 600 MG tablet  Commonly known as:  ZYVOX  Take 1 tablet (600 mg total) by mouth every 12 (twelve) hours.        Filed Vitals:   09/13/14 0514  BP: 138/90  Pulse: 74  Temp: 98.2 F (36.8 C)  Resp: 20    Skin clean, dry and intact without evidence of skin break down, no evidence of skin tears noted. IV catheter discontinued intact. Site without signs and symptoms of complications. Dressing and pressure applied. Pt denies pain at this time. No complaints noted.  Patient transferred to [facility] via [place].   Nonie Hoyer S 09/13/2014 11:11 AM

## 2014-09-13 NOTE — Clinical Social Work Placement (Signed)
Clinical Social Work Department CLINICAL SOCIAL WORK PLACEMENT NOTE 09/13/2014  Patient:  ZACCARY, CREECH  Account Number:  [id number] Admit date:  09/04/2014  Clinical Social Worker:  Dede Query, CLINICAL SOCIAL WORKER  Date/time:  09/13/2014 10:31 AM  Clinical Social Work is seeking post-discharge placement for this patient at the following level of care:   [place]   (*CSW will update this form in Epic as items are completed)   09/12/2014  Patient/family provided with [facility] Department of Clinical Social Work's list of facilities offering this level of care within the geographic area requested by the patient (or if unable, by the patient's family).  09/12/2014  Patient/family informed of their freedom to choose among providers that offer the needed level of care, that participate in Medicare, Medicaid or managed care program needed by the patient, have an available bed and are willing to accept the patient.  09/12/2014  Patient/family informed of [facility]' ownership interest in [facility], as well as of the fact that they are under no obligation to receive care at this facility.  PASARR submitted to EDS on  PASARR number received on   FL2 transmitted to all facilities in geographic area requested by pt/family on  09/12/2014 FL2 transmitted to all facilities within larger geographic area on   Patient informed that his/her managed care company has contracts with or will negotiate with  certain facilities, including the following:     Patient/family informed of bed offers received:  09/12/2014 Patient chooses bed at [facility] Physician recommends and patient chooses bed at    Patient to be transferred to [facility] on  09/13/2014 Patient to be transferred to facility by ambulance Patient and family notified of transfer on 09/13/2014 Name of family member notified:  [name] love  The following physician  request were entered in Epic:   Additional Comments:  .Dede Query, [place] Worker - Weekend Coverage cell #: [phone]

## 2014-09-15 ENCOUNTER — Ambulatory Visit: Payer: Medicare Other

## 2014-09-15 ENCOUNTER — Non-Acute Institutional Stay (SKILLED_NURSING_FACILITY): Payer: Medicare Other | Admitting: Internal Medicine

## 2014-09-15 DIAGNOSIS — I10 Essential (primary) hypertension: Secondary | ICD-10-CM

## 2014-09-15 DIAGNOSIS — C3491 Malignant neoplasm of unspecified part of right bronchus or lung: Secondary | ICD-10-CM

## 2014-09-15 DIAGNOSIS — N179 Acute kidney failure, unspecified: Secondary | ICD-10-CM

## 2014-09-15 DIAGNOSIS — D638 Anemia in other chronic diseases classified elsewhere: Secondary | ICD-10-CM

## 2014-09-15 DIAGNOSIS — E8809 Other disorders of plasma-protein metabolism, not elsewhere classified: Secondary | ICD-10-CM

## 2014-09-15 DIAGNOSIS — E871 Hypo-osmolality and hyponatremia: Secondary | ICD-10-CM

## 2014-09-15 DIAGNOSIS — J189 Pneumonia, unspecified organism: Secondary | ICD-10-CM

## 2014-09-15 DIAGNOSIS — J9601 Acute respiratory failure with hypoxia: Secondary | ICD-10-CM

## 2014-09-15 LAB — CULTURE, BLOOD (ROUTINE X 2)
Culture: NO GROWTH
Culture: NO GROWTH

## 2014-09-15 NOTE — Progress Notes (Signed)
MRN: [id number] Name: Stephen Ellis  Sex: male Age: 69 y.o. DOB: 07-24-1946  [place] #: Helene Kelp Facility/Room: 308 Level Of Care: SNF Provider: Inocencio Homes D Emergency Contacts: Extended Emergency Contact Information Primary Emergency Contact: Dunlap of [place] Phone: [phone] Relation: Son Secondary Emergency Contact: [name] Address: [address]          [place], [place] [address] Johnnette Litter of [place] Phone: [phone] Mobile Phone: [phone] Relation: Nephew     Allergies: Review of patient's allergies indicates no known allergies.  Chief Complaint  Patient presents with  . New Admit To SNF    HPI: Patient is 69 y.o. male who is admitted to SNF for OT/PT for strengthening after a new dx of bronchogenic CA.  Past Medical History  Diagnosis Date  . Syncope   . Dehydration   . Blind left eye   . Hypertension   . Anemia     "low Blood"  . Dementia     poor memory  . Cancer     Lung  . Lung cancer 08/12/14    Right lung    Past Surgical History  Procedure Laterality Date  . No past surgeries    . Video bronchoscopy Bilateral 07/29/2014    Procedure: VIDEO BRONCHOSCOPY WITHOUT FLUORO;  Surgeon: Tanda Rockers, MD;  Location: [facility] ENDOSCOPY;  Service: Cardiopulmonary;  Laterality: Bilateral;  . Video bronchoscopy Bilateral 08/12/2014    Procedure: VIDEO BRONCHOSCOPY WITHOUT FLUORO;  Surgeon: Wilhelmina Mcardle, MD;  Location: [facility] ENDOSCOPY;  Service: Cardiopulmonary;  Laterality: Bilateral;      Medication List       This list is accurate as of: 09/15/14 11:59 PM.  Always use your most recent med list.               acetaminophen-codeine 300-30 MG per tablet  Commonly known as:  TYLENOL #3  Take 1 tablet by mouth every 4 (four) hours as needed for moderate pain. q 4h prn     amLODipine 10 MG tablet  Commonly known as:  NORVASC  Take 10 mg by mouth daily.     budesonide-formoterol 160-4.5 MCG/ACT  inhaler  Commonly known as:  SYMBICORT  Inhale 2 puffs into the lungs 2 (two) times daily.     ipratropium-albuterol 0.5-2.5 (3) MG/3ML Soln  Commonly known as:  DUONEB  Take 3 mLs by nebulization every 6 (six) hours.     linezolid 600 MG tablet  Commonly known as:  ZYVOX  Take 1 tablet (600 mg total) by mouth every 12 (twelve) hours.        No orders of the defined types were placed in this encounter.    Immunization History  Administered Date(s) Administered  . Influenza Split 06/05/2014  . Pneumococcal Polysaccharide-23 08/09/2014    History  Substance Use Topics  . Smoking status: Former Smoker -- 1.00 packs/day for 38 years    Quit date: 06/24/2014  . Smokeless tobacco: Never Used  . Alcohol Use: No    Family history is noncontributory    Review of Systems  DATA OBTAINED: from patient GENERAL:  no fevers, fatigue, appetite changes SKIN: No itching, rash or wounds EYES: No eye pain, redness, discharge EARS: No earache, tinnitus, change in hearing NOSE: No congestion, drainage or bleeding  MOUTH/THROAT: No mouth or tooth pain, No sore throat RESPIRATORY: No cough, wheezing, SOB CARDIAC: No chest pain, palpitations, lower extremity edema  GI: No abdominal pain, No N/V/D or constipation, No heartburn  or reflux  GU: No dysuria, frequency or urgency, or incontinence  MUSCULOSKELETAL: No unrelieved bone/joint pain NEUROLOGIC: No headache, dizziness or focal weakness PSYCHIATRIC: No overt anxiety or sadness, No behavior issue.   Filed Vitals:   09/15/14 2101  BP: 134/88  Pulse: 97  Temp: 97.5 F (36.4 C)  Resp: 18    Physical Exam  GENERAL APPEARANCE: Alert, conversant,  No acute distress.  SKIN: No diaphoresis rash HEAD: Normocephalic, atraumatic  EYES: Conjunctiva/lids clear. Pupils round, reactive. EOMs intact.  EARS: External exam WNL, canals clear. Hearing grossly normal.  NOSE: No deformity or discharge.  MOUTH/THROAT: Lips w/o lesions   RESPIRATORY: Breathing is even, unlabored. Lung sounds are diffusely decreased   CARDIOVASCULAR: Heart RRR no murmurs, rubs or gallops. No peripheral edema.   GASTROINTESTINAL: Abdomen is soft, non-tender, not distended w/ normal bowel sounds. GENITOURINARY: Bladder non tender, not distended  MUSCULOSKELETAL: No abnormal joints or musculature NEUROLOGIC:  Cranial nerves 2-12 grossly intact  PSYCHIATRIC: Mood and affect appropriate to situation, no behavioral issues  Patient Active Problem List   Diagnosis Date Noted  . AKI (acute kidney injury) 09/20/2014  . Hyponatremia 09/20/2014  . Pleural effusion, right 09/16/2014  . Acute respiratory failure with hypoxia 09/06/2014  . Anemia of chronic disease 09/06/2014  . Leukopenia 09/06/2014  . HCAP (healthcare-associated pneumonia) 09/01/2014  . Essential hypertension 09/01/2014  . Bronchogenic carcinoma of right lung   . Postobstructive pneumonia 08/07/2014  . Plasma cell dyscrasia 04/19/2012    CBC    Component Value Date/Time   WBC 2.3* 09/10/2014 0420   WBC 5.1 08/25/2014 1127   RBC 3.68* 09/10/2014 0420   RBC 3.88* 08/25/2014 1127   HGB 10.7* 09/10/2014 0420   HGB 11.3* 08/25/2014 1127   HCT 32.9* 09/10/2014 0420   HCT 34.3* 08/25/2014 1127   PLT 124* 09/10/2014 0420   PLT 260 08/25/2014 1127   MCV 89.4 09/10/2014 0420   MCV 88.5 08/25/2014 1127   LYMPHSABS 0.6* 09/01/2014 0433   LYMPHSABS 1.0 08/25/2014 1127   MONOABS 0.4 09/01/2014 0433   MONOABS 0.8 08/25/2014 1127   EOSABS 0.0 09/01/2014 0433   EOSABS 0.0 08/25/2014 1127   BASOSABS 0.0 09/01/2014 0433   BASOSABS 0.0 08/25/2014 1127    CMP     Component Value Date/Time   NA 134* 09/13/2014 0523   NA 138 08/25/2014 1128   K 3.8 09/13/2014 0523   K 4.0 08/25/2014 1128   CL 104 09/13/2014 0523   CL 105 07/20/2012 1327   CO2 22 09/13/2014 0523   CO2 25 08/25/2014 1128   GLUCOSE 90 09/13/2014 0523   GLUCOSE 94 08/25/2014 1128   GLUCOSE 99 07/20/2012 1327    BUN 22 09/13/2014 0523   BUN 17.0 08/25/2014 1128   CREATININE 2.13* 09/13/2014 0523   CREATININE 1.2 08/25/2014 1128   CALCIUM 8.7 09/13/2014 0523   CALCIUM 9.9 08/25/2014 1128   PROT 9.5* 08/25/2014 1128   PROT 8.5* 08/08/2014 0435   ALBUMIN 2.3* 08/25/2014 1128   ALBUMIN 2.6* 08/08/2014 0435   AST 22 08/25/2014 1128   AST 17 08/08/2014 0435   ALT 32 08/25/2014 1128   ALT 12 08/08/2014 0435   ALKPHOS 65 08/25/2014 1128   ALKPHOS 59 08/08/2014 0435   BILITOT 0.42 08/25/2014 1128   BILITOT 0.5 08/08/2014 0435   GFRNONAA 31* 09/13/2014 0523   GFRAA 35* 09/13/2014 0523    Assessment and Plan  Acute respiratory failure with hypoxia of Bronchogenic carcinoma of right lung and  Postobstructive pneumonia: - Patient underwent last bronchoscopy on 08/12/2014. The final pathology results suspicious for malignancy. PET scan further performed on 08/14/2014 - findings consistent with primary bronchogenic carcinoma. - Started on empiric antibiotic Vanc and cefepime on 09/01/2014. Develop AKI, d/w ID rec to switch to linezolid. - Once the pt became afebrile he was d/c to faqcility. - Had simulation therapy 12/31 and RT 09/09/2014, Dr. Pablo Ledger recommended only a single fraction of radiation.  - Continue oxygen support with nasal cannula. - Continue DuoNeb every 8 hours scheduled.   Postobstructive pneumonia 2/2 suspicious RUL bronchogeinic CA; started on vanc and cefepime, but switched to linezolid when developed AKI   Bronchogenic carcinoma of right lung Bronchoscopy on 08/12/2014. The final pathology results suspicious for malignancy. PET scan further performed on 08/14/2014 - findings consistent with primary bronchogenic carcinoma; Had simulation therapy 12/31 and RT 09/09/2014, Dr. Pablo Ledger recommended only a single fraction of radiation   AKI (acute kidney injury) Worsening creatinine with an Vanc trough level. D/c vancomycin. - He was also hypokalemia and hypochloremic  question prerenal etiology. - with Fena < 1% - treated with mantainance fluids and cr improved, will need a b-met in 1 week.    Anemia of chronic disease Hb stable at 10.7    Hyponatremia                                           Likely dehdration;improved with IVF    Plasma cell dyscrasia Follow up with oncology.   Essential hypertension Continue norvasc     Hennie Duos, MD

## 2014-09-16 ENCOUNTER — Other Ambulatory Visit: Payer: Self-pay | Admitting: *Deleted

## 2014-09-16 ENCOUNTER — Telehealth: Payer: Self-pay | Admitting: *Deleted

## 2014-09-16 ENCOUNTER — Encounter: Payer: Self-pay | Admitting: *Deleted

## 2014-09-16 ENCOUNTER — Ambulatory Visit: Payer: Medicare Other | Admitting: Nutrition

## 2014-09-16 ENCOUNTER — Ambulatory Visit: Payer: Medicare Other

## 2014-09-16 ENCOUNTER — Encounter: Payer: Medicare Other | Admitting: Nutrition

## 2014-09-16 ENCOUNTER — Telehealth: Payer: Self-pay | Admitting: Internal Medicine

## 2014-09-16 ENCOUNTER — Other Ambulatory Visit: Payer: Medicare Other

## 2014-09-16 DIAGNOSIS — J90 Pleural effusion, not elsewhere classified: Secondary | ICD-10-CM | POA: Diagnosis present

## 2014-09-16 NOTE — Telephone Encounter (Signed)
Per Dr Vista Mink, pt needs chemo education today, then schedule lab/ f/u appt/ and 1st time taxol/carbo q3week will start next week.  Spoke to pt's and family member in the lobby.  They are aware of the plan.

## 2014-09-16 NOTE — Telephone Encounter (Signed)
gv adn rpinted appt sched and avs for pt for Jan

## 2014-09-16 NOTE — Progress Notes (Signed)
69 year old male diagnosed with right lung mass/lung cancer.  He is a patient of Dr. Earlie Server.  Past medical history includes blindness in the left eye, hypertension, anemia, dementia, tobacco, dehydration.  Medications include Zofran.  Labs include potassium 3.3.  Height: 6 feet 0 inches. Weight: 168 pounds December 28. Usual body weight: 185 pounds November 2015. BMI: 22.78.  Patient currently staying at [facility].  He normally lives with his sister. Patient reports a good appetite and good oral intake at nursing home. Patient states decreased oral intake was secondary to recent hospitalizations for PNA.Marland Kitchen Patient denies nausea, vomiting, constipation or diarrhea. Patient is willing to try oral nutrition supplements; prefers strawberry flavor.  Nutrition diagnosis: Unintended weight loss related to inadequate oral intake as evidenced by 13% weight loss in one month.  Intervention: Patient educated to increase protein containing foods at meals. Recommended patient request snacks between meals. Provided fact sheet on increasing calories and protein. Provided patient with samples of strawberry flavored ensure complete.  Also provided him with coupons.  Family is agreeable to purchasing. Questions were answered.  Teach back method used.  Monitoring, evaluation, goals: Patient will increase oral intake to minimize further weight loss.  Next visit: Will follow-up with patient as needed throughout treatment.  **Disclaimer: This note was dictated with voice recognition software. Similar sounding words can inadvertently be transcribed and this note may contain transcription errors which may not have been corrected upon publication of note.**

## 2014-09-17 ENCOUNTER — Ambulatory Visit: Payer: Medicare Other

## 2014-09-18 ENCOUNTER — Ambulatory Visit: Payer: Medicare Other

## 2014-09-19 ENCOUNTER — Telehealth: Payer: Self-pay | Admitting: Internal Medicine

## 2014-09-19 ENCOUNTER — Ambulatory Visit: Payer: Medicare Other | Admitting: Internal Medicine

## 2014-09-19 ENCOUNTER — Telehealth: Payer: Self-pay | Admitting: *Deleted

## 2014-09-19 ENCOUNTER — Ambulatory Visit: Payer: Medicare Other

## 2014-09-19 ENCOUNTER — Other Ambulatory Visit: Payer: Self-pay | Admitting: *Deleted

## 2014-09-19 ENCOUNTER — Other Ambulatory Visit: Payer: Medicare Other

## 2014-09-19 NOTE — Telephone Encounter (Signed)
, °

## 2014-09-19 NOTE — Progress Notes (Signed)
[facility] for lab and f/u appt todau due to transportation issues.  [name] called wanting to r/s.  New appts given for Monday 09/22/14.  Onc tx schedule filled out for appts 1:15pm lab/ 1:45pm f/u.  Asked for [name] to inform Helene Kelp to avoid any other transportation issues.  He verbalized understanding.

## 2014-09-20 ENCOUNTER — Encounter: Payer: Self-pay | Admitting: Internal Medicine

## 2014-09-20 DIAGNOSIS — N179 Acute kidney failure, unspecified: Secondary | ICD-10-CM | POA: Insufficient documentation

## 2014-09-20 DIAGNOSIS — E871 Hypo-osmolality and hyponatremia: Secondary | ICD-10-CM | POA: Insufficient documentation

## 2014-09-20 NOTE — Assessment & Plan Note (Signed)
Worsening creatinine with an Vanc trough level. D/c vancomycin. - He was also hypokalemia and hypochloremic question prerenal etiology. - with Fena < 1% - treated with mantainance fluids and cr improved, will need a b-met in 1 week.

## 2014-09-20 NOTE — Assessment & Plan Note (Signed)
of Bronchogenic carcinoma of right lung and Postobstructive pneumonia: - Patient underwent last bronchoscopy on 08/12/2014. The final pathology results suspicious for malignancy. PET scan further performed on 08/14/2014 - findings consistent with primary bronchogenic carcinoma. - Started on empiric antibiotic Vanc and cefepime on 09/01/2014. Develop AKI, d/w ID rec to switch to linezolid. - Once the pt became afebrile he was d/c to faqcility. - Had simulation therapy 12/31 and RT 09/09/2014, Dr. Pablo Ledger recommended only a single fraction of radiation.  - Continue oxygen support with nasal cannula. - Continue DuoNeb every 8 hours scheduled.

## 2014-09-20 NOTE — Assessment & Plan Note (Signed)
2/2 suspicious RUL bronchogeinic CA; started on vanc and cefepime, but switched to linezolid when developed AKI

## 2014-09-20 NOTE — Assessment & Plan Note (Signed)
Follow up with oncology

## 2014-09-20 NOTE — Assessment & Plan Note (Signed)
Continue norvasc

## 2014-09-20 NOTE — Assessment & Plan Note (Signed)
                                          Likely dehdration;improved with IVF

## 2014-09-20 NOTE — Assessment & Plan Note (Signed)
Hb stable at 10.7

## 2014-09-20 NOTE — Assessment & Plan Note (Addendum)
Bronchoscopy on 08/12/2014. The final pathology results suspicious for malignancy. PET scan further performed on 08/14/2014 - findings consistent with primary bronchogenic carcinoma; Had simulation therapy 12/31 and RT 09/09/2014, Dr. Pablo Ledger recommended only a single fraction of radiation

## 2014-09-22 ENCOUNTER — Other Ambulatory Visit (HOSPITAL_BASED_OUTPATIENT_CLINIC_OR_DEPARTMENT_OTHER): Payer: Medicare Other

## 2014-09-22 ENCOUNTER — Encounter: Payer: Self-pay | Admitting: Internal Medicine

## 2014-09-22 ENCOUNTER — Ambulatory Visit (HOSPITAL_BASED_OUTPATIENT_CLINIC_OR_DEPARTMENT_OTHER): Payer: Medicare Other | Admitting: Internal Medicine

## 2014-09-22 ENCOUNTER — Telehealth: Payer: Self-pay | Admitting: Internal Medicine

## 2014-09-22 DIAGNOSIS — E8809 Other disorders of plasma-protein metabolism, not elsewhere classified: Secondary | ICD-10-CM

## 2014-09-22 DIAGNOSIS — C3411 Malignant neoplasm of upper lobe, right bronchus or lung: Secondary | ICD-10-CM

## 2014-09-22 DIAGNOSIS — C3491 Malignant neoplasm of unspecified part of right bronchus or lung: Secondary | ICD-10-CM

## 2014-09-22 LAB — CBC WITH DIFFERENTIAL/PLATELET
BASO%: 1 % (ref 0.0–2.0)
BASOS ABS: 0 10*3/uL (ref 0.0–0.1)
EOS%: 0.7 % (ref 0.0–7.0)
Eosinophils Absolute: 0 10*3/uL (ref 0.0–0.5)
HCT: 31.1 % — ABNORMAL LOW (ref 38.4–49.9)
HGB: 9.9 g/dL — ABNORMAL LOW (ref 13.0–17.1)
LYMPH#: 0.7 10*3/uL — AB (ref 0.9–3.3)
LYMPH%: 16 % (ref 14.0–49.0)
MCH: 28.4 pg (ref 27.2–33.4)
MCHC: 31.9 g/dL — ABNORMAL LOW (ref 32.0–36.0)
MCV: 89.1 fL (ref 79.3–98.0)
MONO#: 1.1 10*3/uL — ABNORMAL HIGH (ref 0.1–0.9)
MONO%: 26 % — ABNORMAL HIGH (ref 0.0–14.0)
NEUT#: 2.3 10*3/uL (ref 1.5–6.5)
NEUT%: 56.3 % (ref 39.0–75.0)
Platelets: 186 10*3/uL (ref 140–400)
RBC: 3.49 10*6/uL — ABNORMAL LOW (ref 4.20–5.82)
RDW: 14 % (ref 11.0–14.6)
WBC: 4.2 10*3/uL (ref 4.0–10.3)

## 2014-09-22 LAB — COMPREHENSIVE METABOLIC PANEL (CC13)
ALT: 74 U/L — ABNORMAL HIGH (ref 0–55)
ANION GAP: 10 meq/L (ref 3–11)
AST: 19 U/L (ref 5–34)
Albumin: 2.5 g/dL — ABNORMAL LOW (ref 3.5–5.0)
Alkaline Phosphatase: 81 U/L (ref 40–150)
BUN: 19.7 mg/dL (ref 7.0–26.0)
CO2: 26 mEq/L (ref 22–29)
CREATININE: 1.9 mg/dL — AB (ref 0.7–1.3)
Calcium: 9.2 mg/dL (ref 8.4–10.4)
Chloride: 102 mEq/L (ref 98–109)
EGFR: 41 mL/min/{1.73_m2} — ABNORMAL LOW (ref >90)
Glucose: 144 mg/dl — ABNORMAL HIGH (ref 70–140)
POTASSIUM: 4.1 meq/L (ref 3.5–5.1)
Sodium: 138 mEq/L (ref 136–145)
TOTAL PROTEIN: 8.7 g/dL — AB (ref 6.4–8.3)
Total Bilirubin: 0.35 mg/dL (ref 0.20–1.20)

## 2014-09-22 MED ORDER — PROCHLORPERAZINE MALEATE 10 MG PO TABS: 10.0000 mg | ORAL_TABLET | Freq: Four times a day (QID) | ORAL | Status: DC | PRN

## 2014-09-22 NOTE — Progress Notes (Signed)
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 2) plasma cell dyscrasia diagnosed in 2013.  PRIOR THERAPY: Status post palliative radiotherapy to the right lung obstructing mass under the care of Dr. Pablo Ledger completed on 09/19/2014.  CURRENT THERAPY: Systemic chemotherapy with carboplatin for AUC of 5 and paclitaxel 175 MG/M2 every 3 weeks with Neulasta support. First cycle on 09/23/2014.  INTERVAL HISTORY: Stephen Ellis 69 y.o. male returns to the clinic today for follow-up visit accompanied by a family member. The patient is feeling fine today with no specific complaints. He was recently admitted to [facility] with postobstructive pneumonia and during his admission the patient was started on a course of palliative radiotherapy under the care of Dr. Pablo Ledger completed on 09/19/2014. He has improvement in his breathing and denied having any significant chest pain but continues to have mild cough with no hemoptysis. He is currently at the skilled nursing facility. He denied having any significant fever or chills, no nausea or vomiting. The patient denied having any significant weight loss or night sweats. He is here today for evaluation and discussion of his treatment options. The previous MRI of the brain showed no evidence for metastatic disease to the brain.  MEDICAL HISTORY: Past Medical History  Diagnosis Date  . Syncope   . Dehydration   . Blind left eye   . Hypertension   . Anemia     "low Blood"  . Dementia     poor memory  . Cancer     Lung  . Lung cancer  08/12/14    Right lung    ALLERGIES:  has No Known Allergies.  MEDICATIONS:  Current Outpatient Prescriptions  Medication Sig Dispense Refill  . acetaminophen-codeine (TYLENOL #3) 300-30 MG per tablet Take 1 tablet by mouth every 4 (four) hours as needed for moderate pain. q 4h prn  0  . amLODipine (NORVASC) 10 MG tablet Take 10 mg by mouth daily.    . budesonide-formoterol (SYMBICORT) 160-4.5 MCG/ACT inhaler Inhale 2 puffs into the lungs 2 (two) times daily. 1 Inhaler 12  . ipratropium-albuterol (DUONEB) 0.5-2.5 (3) MG/3ML SOLN Take 3 mLs by nebulization every 6 (six) hours. 360 mL 1  . linezolid (ZYVOX) 600 MG tablet Take 1 tablet (600 mg total) by mouth every 12 (twelve) hours. 10 tablet 0  . prochlorperazine (COMPAZINE) 10 MG tablet Take 1 tablet (10 mg total) by mouth every 6 (six) hours as needed for nausea or vomiting. 30 tablet 0   No current facility-administered medications for this visit.    SURGICAL HISTORY:  Past Surgical History  Procedure Laterality Date  . No past surgeries    . Video bronchoscopy Bilateral 07/29/2014    Procedure: VIDEO BRONCHOSCOPY WITHOUT FLUORO;  Surgeon: Tanda Rockers, MD;  Location: [facility] ENDOSCOPY;  Service: Cardiopulmonary;  Laterality: Bilateral;  . Video bronchoscopy Bilateral 08/12/2014    Procedure: VIDEO BRONCHOSCOPY WITHOUT FLUORO;  Surgeon: Wilhelmina Mcardle, MD;  Location: [facility] ENDOSCOPY;  Service: Cardiopulmonary;  Laterality: Bilateral;    REVIEW OF SYSTEMS:  Constitutional: positive for fatigue Eyes: negative Ears, nose, mouth, throat, and face: negative Respiratory:  positive for cough and dyspnea on exertion Cardiovascular: negative Gastrointestinal: negative Genitourinary:negative Integument/breast: negative Hematologic/lymphatic: negative Musculoskeletal:positive for muscle weakness Neurological: negative Behavioral/Psych: negative Endocrine: negative Allergic/Immunologic: negative   PHYSICAL EXAMINATION: General appearance:  alert, cooperative, fatigued and no distress Head: Normocephalic, without obvious abnormality, atraumatic Neck: no adenopathy, no JVD, supple, symmetrical, trachea midline and thyroid not enlarged, symmetric, no tenderness/mass/nodules Lymph nodes: Cervical, supraclavicular, and axillary nodes normal. Resp: wheezes RLL and RML Back: symmetric, no curvature. ROM normal. No CVA tenderness. Cardio: regular rate and rhythm, S1, S2 normal, no murmur, click, rub or gallop GI: soft, non-tender; bowel sounds normal; no masses,  no organomegaly Extremities: extremities normal, atraumatic, no cyanosis or edema Neurologic: Alert and oriented X 3, normal strength and tone. Normal symmetric reflexes. Normal coordination and gait  ECOG PERFORMANCE STATUS: 2 - Symptomatic, <50% confined to bed  There were no vitals taken for this visit.  LABORATORY DATA: Lab Results  Component Value Date   WBC 4.2 09/22/2014   HGB 9.9* 09/22/2014   HCT 31.1* 09/22/2014   MCV 89.1 09/22/2014   PLT 186 09/22/2014      Chemistry      Component Value Date/Time   NA 138 09/22/2014 1330   NA 134* 09/13/2014 0523   K 4.1 09/22/2014 1330   K 3.8 09/13/2014 0523   CL 104 09/13/2014 0523   CL 105 07/20/2012 1327   CO2 26 09/22/2014 1330   CO2 22 09/13/2014 0523   BUN 19.7 09/22/2014 1330   BUN 22 09/13/2014 0523   CREATININE 1.9* 09/22/2014 1330   CREATININE 2.13* 09/13/2014 0523      Component Value Date/Time   CALCIUM 9.2 09/22/2014 1330   CALCIUM 8.7 09/13/2014 0523   ALKPHOS 81 09/22/2014 1330   ALKPHOS 59 08/08/2014 0435   AST 19 09/22/2014 1330   AST 17 08/08/2014 0435   ALT 74* 09/22/2014 1330   ALT 12 08/08/2014 0435   BILITOT 0.35 09/22/2014 1330   BILITOT 0.5 08/08/2014 0435       RADIOGRAPHIC STUDIES: Dg Chest 2 View  09/01/2014   CLINICAL DATA:  Shortness of breath  EXAM: CHEST  2 VIEW  COMPARISON:  08/07/2014  FINDINGS: Progressive collapse of the right lung, completely involving the  right upper and middle lobes. There is opacity within the aerated right lower lobe, compatible with postobstructive changes. A known right perihilar mass is difficult to visualize due to the extensive parenchymal opacity. The left lung is well aerated. Heart size is normal.  IMPRESSION: Lung cancer with progressive right lung collapse, now involving the right upper and middle lobes. There is postobstructive pneumonia or pneumonitis in the right lower lobe.   Electronically Signed   By: Jorje Guild M.D.   On: 09/01/2014 04:19   Ct Chest Wo Contrast  09/10/2014   CLINICAL DATA:  History of lung carcinoma with shortness of Breath  EXAM: CT CHEST WITHOUT CONTRAST  TECHNIQUE: Multidetector CT imaging of the chest was performed following the standard protocol without IV contrast.  COMPARISON:  08/14/2014, 09/01/2014  FINDINGS: The left lung is again well aerated without focal infiltrate or sizable effusion. Persistent central right hilar mass lesion with bronchial occlusion is noted. Collapse of the right upper lobe is seen and relatively stable. Some additional peripheral atelectasis is noted in the right upper lobe which was not present on the prior exam. Some atelectatic changes are again seen in the right lower lobe also present on the prior exam but mildly increased. A  new right-sided pleural effusion of a mild-to-moderate degree is noted. Soft tissue density is noted within the bronchus intermedius and right lower lobe bronchus which may be simply related to mucous although the possibility of in growth would deserve some consideration. There is near complete occlusion of the lower lobe bronchus at this level. These changes however are stable from the prior exam.  Stable subcarinal and left hilar adenopathy is noted when compare with the prior exam.  Scanning into the upper abdomen reveals no acute abnormality. The osseous structures show degenerative change of the thoracic spine. No definitive bony metastatic  lesions are seen.  IMPRESSION: Centrally obstructing right hilar mass with peripheral right lobe collapse and atelectasis.  Changes involving the lower lobe bronchus on the right which are stable from the prior exam with some degree of right lower lobe atelectasis.  New mild to moderate right pleural effusion.  No other acute abnormality is seen.   Electronically Signed   By: Inez Catalina M.D.   On: 09/10/2014 11:19   Mr Brain Wo Contrast  09/10/2014   CLINICAL DATA:  69 year old male with current history of lung cancer and recent onset altered mental status.  Renal insufficiency precludes post-contrast imaging at this time.  Subsequent encounter.  EXAM: MRI HEAD WITHOUT CONTRAST  TECHNIQUE: Multiplanar, multiecho pulse sequences of the brain and surrounding structures were obtained without intravenous contrast.  COMPARISON:  Head CT without contrast 1/11/ 2015, and earlier.  FINDINGS: Cerebral volume is not significantly changed. Major intracranial vascular flow voids are preserved. No midline shift, mass effect, or evidence of intracranial mass lesion.  Extensive signal changes most compatible with chronic small vessel disease, including multiple cerebral white matter and deep gray matter nuclei chronic lacunar infarcts as well as confluent bilateral cerebral white matter T2 and FLAIR hyperintensity. Multiple chronic lacunar infarcts in the brainstem, mostly the pons. Multiple chronic lacunar infarcts in both cerebellar hemispheres. Occasional chronic micro hemorrhages in the brain. No restricted diffusion or evidence of acute infarction.  Negative pituitary and cervicomedullary junction. Grossly negative visualized cervical spine. Bone marrow signal is mildly heterogeneous but overall within normal limits. Visible internal auditory structures appear normal. Mastoids are clear. Trace paranasal sinus mucosal thickening. Postoperative changes to the left globe, otherwise negative orbits soft tissues. Visualized  scalp soft tissues are within normal limits.  IMPRESSION: 1. No evidence of acute or metastatic intracranial abnormality on this noncontrast exam. 2. Advanced chronic small vessel disease, including numerous chronic lacunar infarcts and occasional chronic micro hemorrhages in the brain.   Electronically Signed   By: Lars Pinks M.D.   On: 09/10/2014 13:17    ASSESSMENT AND PLAN: This is a very pleasant 69 years old African-American male with: 1) stage IV non-small cell lung cancer with neuroendocrine features: He status post short course of palliative radiotherapy to the right lung obstructing mass under the care of Dr. Pablo Ledger. And had a lengthy discussion with the patient and his family member today about his current disease status and treatment options. I discussed with the patient the palliative systemic chemotherapy versus palliative care and hospice. The patient is interested in proceeding with systemic chemotherapy. He would be treated with carboplatin for AUC of 5 and paclitaxel 175 MG/M2 with Neulasta support every 3 weeks. I reminded the patient of the adverse effect of this treatment including but not limited to alopecia, myelosuppression, nausea and vomiting, peripheral neuropathy, liver or renal dysfunction. He is expected to start the first cycle of this treatment tomorrow.  The patient would come back for follow-up visit in 3 weeks for reevaluation before starting cycle #2. I will call his pharmacy was prescription for Compazine 10 mg by mouth every 6 hours as needed for nausea. 2) plasma cell dyscrasia: Currently stable. We'll continue the patient on observation. The patient was advised to call immediately if he has any concerning symptoms in the interval. The patient voices understanding of current disease status and treatment options and is in agreement with the current care plan.  All questions were answered. The patient knows to call the clinic with any problems, questions or  concerns. We can certainly see the patient much sooner if necessary.  I spent 15 minutes counseling the patient face to face. The total time spent in the appointment was 25 minutes.  Disclaimer: This note was dictated with voice recognition software. Similar sounding words can inadvertently be transcribed and may not be corrected upon review.

## 2014-09-22 NOTE — Telephone Encounter (Signed)
Gave asv & cal. Sent mess to sch tx.

## 2014-09-22 NOTE — Telephone Encounter (Signed)
error 

## 2014-09-23 ENCOUNTER — Ambulatory Visit (HOSPITAL_BASED_OUTPATIENT_CLINIC_OR_DEPARTMENT_OTHER): Payer: Medicare Other

## 2014-09-23 DIAGNOSIS — Z5111 Encounter for antineoplastic chemotherapy: Secondary | ICD-10-CM

## 2014-09-23 DIAGNOSIS — C3411 Malignant neoplasm of upper lobe, right bronchus or lung: Secondary | ICD-10-CM | POA: Diagnosis not present

## 2014-09-23 DIAGNOSIS — C3491 Malignant neoplasm of unspecified part of right bronchus or lung: Secondary | ICD-10-CM

## 2014-09-23 MED ORDER — DIPHENHYDRAMINE HCL 50 MG/ML IJ SOLN
INTRAMUSCULAR | Status: AC
  Filled 2014-09-23: qty 1

## 2014-09-23 MED ORDER — ONDANSETRON 16 MG/50ML IVPB (CHCC)
INTRAVENOUS | Status: AC
  Filled 2014-09-23: qty 16

## 2014-09-23 MED ORDER — PACLITAXEL CHEMO INJECTION 300 MG/50ML
175.0000 mg/m2 | Freq: Once | INTRAVENOUS | Status: AC
  Administered 2014-09-23: 342 mg via INTRAVENOUS
  Filled 2014-09-23: qty 57

## 2014-09-23 MED ORDER — SODIUM CHLORIDE 0.9 % IV SOLN
Freq: Once | INTRAVENOUS | Status: AC
  Administered 2014-09-23: 10:00:00 via INTRAVENOUS

## 2014-09-23 MED ORDER — FAMOTIDINE IN NACL 20-0.9 MG/50ML-% IV SOLN
20.0000 mg | Freq: Once | INTRAVENOUS | Status: AC
  Administered 2014-09-23: 20 mg via INTRAVENOUS

## 2014-09-23 MED ORDER — ONDANSETRON 16 MG/50ML IVPB (CHCC)
16.0000 mg | Freq: Once | INTRAVENOUS | Status: AC
  Administered 2014-09-23: 16 mg via INTRAVENOUS

## 2014-09-23 MED ORDER — FAMOTIDINE IN NACL 20-0.9 MG/50ML-% IV SOLN
INTRAVENOUS | Status: AC
  Filled 2014-09-23: qty 50

## 2014-09-23 MED ORDER — DEXAMETHASONE SODIUM PHOSPHATE 20 MG/5ML IJ SOLN
INTRAMUSCULAR | Status: AC
  Filled 2014-09-23: qty 5

## 2014-09-23 MED ORDER — SODIUM CHLORIDE 0.9 % IV SOLN
331.0000 mg | Freq: Once | INTRAVENOUS | Status: AC
  Administered 2014-09-23: 330 mg via INTRAVENOUS
  Filled 2014-09-23: qty 33

## 2014-09-23 MED ORDER — DIPHENHYDRAMINE HCL 50 MG/ML IJ SOLN
50.0000 mg | Freq: Once | INTRAMUSCULAR | Status: AC
  Administered 2014-09-23: 50 mg via INTRAVENOUS

## 2014-09-23 MED ORDER — DEXAMETHASONE SODIUM PHOSPHATE 20 MG/5ML IJ SOLN
20.0000 mg | Freq: Once | INTRAMUSCULAR | Status: AC
  Administered 2014-09-23: 20 mg via INTRAVENOUS

## 2014-09-23 NOTE — Patient Instructions (Signed)
[facility] Discharge Instructions for Patients Receiving Chemotherapy   Today you received the following chemotherapy agents  Taxol and Carboplatin  To help prevent nausea and vomiting after your treatment, we encourage you to take your nausea medication Zofran as directed   If you develop nausea and vomiting that is not controlled by your nausea medication, call the clinic.   BELOW ARE SYMPTOMS THAT SHOULD BE REPORTED IMMEDIATELY:  *FEVER GREATER THAN 100.5 F  *CHILLS WITH OR WITHOUT FEVER  NAUSEA AND VOMITING THAT IS NOT CONTROLLED WITH YOUR NAUSEA MEDICATION  *UNUSUAL SHORTNESS OF BREATH  *UNUSUAL BRUISING OR BLEEDING  TENDERNESS IN MOUTH AND THROAT WITH OR WITHOUT PRESENCE OF ULCERS  *URINARY PROBLEMS  *BOWEL PROBLEMS  UNUSUAL RASH Items with * indicate a potential emergency and should be followed up as soon as possible.  Feel free to call the clinic you have any questions or concerns. The clinic phone number is [phone].

## 2014-09-23 NOTE — Progress Notes (Signed)
Pt  Accidentally pulled IV  Out in lt hand.  IV restarted in pt's  Rt hand. Pt instructed to be more careful of IV lines.

## 2014-09-23 NOTE — Progress Notes (Signed)
Thoracic Location of Tumor / Histology:Stage IIIB/IV large central right upper lobe lung cancer with neuroendocrine diferentiation.  Patient presented  Biopsies of 08/12/14 right upper lobe Diagnosis Lung, biopsy, right upper lobe - MINUTE FRAGMENTS OF HIGHLY ATYPICAL EPITHELIOID CELLS, SUSPICIOUS FOR MALIGNANCY, SEE COMMENT.  Tobacco/Marijuana/Snuff/ETOH QLR:JPVG smoking on 06/24/2014 after 38 year history of 1ppd.  Past/Anticipated interventions by cardiothoracic surgery, if KKD:PTELM bronchoscopy without fluoroscopy on 07/29/14 by Dr.Wert and on 08/12/14 by Dr. Alva Garnet   Past/Anticipated interventions by medical oncology, if RAJ:HHIDUPBD chemotherapy with carboplatin and paclitaxel every 3 weeks with neulasta support.First cycle 09/23/14.  Signs/Symptoms  Weight changes, if any:  Respiratory complaints, if any:   Hemoptysis, if any:   Pain issues, if any:  SAFETY ISSUES:  Prior radiation? 1 treatment of palliative radiation  Pacemaker/ICD? No  Possible current pregnancy?NO  Is the patient on methotrexate?NO   Current Complaints / other details:  NKDA NDKA

## 2014-09-24 ENCOUNTER — Telehealth: Payer: Self-pay | Admitting: *Deleted

## 2014-09-24 ENCOUNTER — Ambulatory Visit: Payer: Medicare Other

## 2014-09-24 NOTE — Telephone Encounter (Signed)
Called Stephen Ellis for chemotherapy F/U.  He is a resident at [facility].  Spoke with nurse Stephen Ellis who says patient is doing well.  Denies n/v.  Denies any new side effects or symptoms.  "he is extremely fatigued and lying around a lot.  Bowel and bladder is functioning well.  Eating and drinking well and I instructed to drink 64 oz minimum daily or at least the day before, of and after treatment.  [name] will advise him to drink more.  Denies questions at this time and encouraged to call if needed.  Reviewed how to call after hours in the case of an emergency.

## 2014-09-24 NOTE — Telephone Encounter (Signed)
-----   Message from Cora Collum, RN sent at 09/24/2014  8:45 AM EST ----- Regarding: Chemo follow up call Contact: [phone] Ist time Taxol and Carboplatin Dr. Julien Nordmann. Pt is resident of [facility]

## 2014-09-25 ENCOUNTER — Ambulatory Visit (HOSPITAL_COMMUNITY)
Admit: 2014-09-25 | Discharge: 2014-09-25 | Disposition: A | Discharge status: Home / Self Care | Payer: No Typology Code available for payment source | Source: Ambulatory Visit | Attending: Radiation Oncology | Admitting: Radiation Oncology

## 2014-09-25 ENCOUNTER — Ambulatory Visit (HOSPITAL_COMMUNITY)
Admission: RE | Admit: 2014-09-25 | Discharge: 2014-09-25 | Disposition: A | Discharge status: Home / Self Care | Payer: No Typology Code available for payment source | Source: Ambulatory Visit | Attending: Radiation Oncology | Admitting: Radiation Oncology

## 2014-09-25 DIAGNOSIS — C3491 Malignant neoplasm of unspecified part of right bronchus or lung: Secondary | ICD-10-CM | POA: Diagnosis not present

## 2014-09-25 NOTE — Progress Notes (Signed)
   Department of Radiation Oncology  Phone:  [phone] Fax:        [phone]   Name: HOWARD BUNTE MRN: [id number]  DOB: Jan 12, 1946  Date: 09/25/2014  Follow Up Visit Note  Diagnosis: Bronchogenic carcinoma of right lung   Staging form: Lung, AJCC 7th Edition     Clinical stage from 09/22/2014: Stage IV (T2a, N3, M1b) - Signed by Curt Bears, MD on 09/22/2014  Summary and Interval since last radiation: 1 month from 8 Gy in 1 fraction completed 09/04/14  Interval History: Marlee presents today for routine followup.  He is "feeling better". His breathing is better. He is not having hemoptysis. His nephew was not able to accompany him today.   Physical Exam:  Filed Vitals:   09/25/14 1112  BP: 121/81  Pulse: 97  Temp: 97.5 F (36.4 C)  Weight: 161 lb 12.8 oz (73.392 kg)  SpO2: 100%   No respiratory distress  IMPRESSION: Dugan is a 69 y.o. male s/p palliative radiation with good symptomatic relief  PLAN:  I will check a chest xray today to verify that his lung has opened up. I don't see a need for further RT.  I will let him proceed with chemo for now. I am happy to see him back on a prn basis. We will communicate this to his nephew.     Thea Silversmith, MD

## 2014-09-29 ENCOUNTER — Telehealth: Payer: Self-pay | Admitting: *Deleted

## 2014-09-29 ENCOUNTER — Other Ambulatory Visit (HOSPITAL_BASED_OUTPATIENT_CLINIC_OR_DEPARTMENT_OTHER): Payer: Medicare Other

## 2014-09-29 ENCOUNTER — Ambulatory Visit: Payer: Medicare Other | Admitting: Oncology

## 2014-09-29 DIAGNOSIS — C3491 Malignant neoplasm of unspecified part of right bronchus or lung: Secondary | ICD-10-CM

## 2014-09-29 DIAGNOSIS — C3411 Malignant neoplasm of upper lobe, right bronchus or lung: Secondary | ICD-10-CM

## 2014-09-29 LAB — COMPREHENSIVE METABOLIC PANEL (CC13)
ALT: 82 U/L — AB (ref 0–55)
ANION GAP: 9 meq/L (ref 3–11)
AST: 37 U/L — ABNORMAL HIGH (ref 5–34)
Albumin: 2.8 g/dL — ABNORMAL LOW (ref 3.5–5.0)
Alkaline Phosphatase: 72 U/L (ref 40–150)
BUN: 30.2 mg/dL — ABNORMAL HIGH (ref 7.0–26.0)
CHLORIDE: 102 meq/L (ref 98–109)
CO2: 24 meq/L (ref 22–29)
CREATININE: 1.8 mg/dL — AB (ref 0.7–1.3)
Calcium: 9.3 mg/dL (ref 8.4–10.4)
EGFR: 45 mL/min/{1.73_m2} — ABNORMAL LOW (ref >90)
GLUCOSE: 108 mg/dL (ref 70–140)
Potassium: 4.3 mEq/L (ref 3.5–5.1)
Sodium: 135 mEq/L — ABNORMAL LOW (ref 136–145)
Total Bilirubin: 0.41 mg/dL (ref 0.20–1.20)
Total Protein: 8.7 g/dL — ABNORMAL HIGH (ref 6.4–8.3)

## 2014-09-29 LAB — CBC WITH DIFFERENTIAL/PLATELET
BASO%: 1.5 % (ref 0.0–2.0)
Basophils Absolute: 0 10*3/uL (ref 0.0–0.1)
EOS ABS: 0 10*3/uL (ref 0.0–0.5)
EOS%: 1.5 % (ref 0.0–7.0)
HEMATOCRIT: 30.3 % — AB (ref 38.4–49.9)
HGB: 9.7 g/dL — ABNORMAL LOW (ref 13.0–17.1)
LYMPH%: 35.7 % (ref 14.0–49.0)
MCH: 28.5 pg (ref 27.2–33.4)
MCHC: 32 g/dL (ref 32.0–36.0)
MCV: 89 fL (ref 79.3–98.0)
MONO#: 0.1 10*3/uL (ref 0.1–0.9)
MONO%: 7.3 % (ref 0.0–14.0)
NEUT#: 0.7 10*3/uL — ABNORMAL LOW (ref 1.5–6.5)
NEUT%: 54 % (ref 39.0–75.0)
Platelets: 187 10*3/uL (ref 140–400)
RBC: 3.41 10*6/uL — AB (ref 4.20–5.82)
RDW: 14.4 % (ref 11.0–14.6)
WBC: 1.4 10*3/uL — ABNORMAL LOW (ref 4.0–10.3)
lymph#: 0.5 10*3/uL — ABNORMAL LOW (ref 0.9–3.3)

## 2014-09-29 NOTE — Telephone Encounter (Signed)
Nephew Stephen Ellis called.  Asked to speak with a Education officer, museum with [facility] and reporting [facility] was not aware of today's appointment, are trying to get him dressed to get here.  Notified Stephen Ellis, noted 09-22-2016 office note and patient needs weekly lab which can be done tomorrow and f/u in 3 weeks.  Stephen Noa notified.  Asked that we notify [facility].    Would like to speak with social worker to get social security benefits , food and clothing as he has lost weight.  Has a meeting scheduled at [facility] October 07, 2014.  Will notify [place] Social workers of this request.  Stephen Noa can be reached at [phone].     Stephen Ellis [phone]) and patient is en route.  Informed Stephen Ellis of the provider visit cancellation and transportation can wait as lab appointment will not take long today.

## 2014-09-30 ENCOUNTER — Non-Acute Institutional Stay (SKILLED_NURSING_FACILITY): Payer: Medicare Other | Admitting: Nurse Practitioner

## 2014-09-30 DIAGNOSIS — I10 Essential (primary) hypertension: Secondary | ICD-10-CM | POA: Diagnosis not present

## 2014-09-30 DIAGNOSIS — N179 Acute kidney failure, unspecified: Secondary | ICD-10-CM | POA: Diagnosis not present

## 2014-09-30 DIAGNOSIS — C3491 Malignant neoplasm of unspecified part of right bronchus or lung: Secondary | ICD-10-CM | POA: Diagnosis not present

## 2014-09-30 DIAGNOSIS — D638 Anemia in other chronic diseases classified elsewhere: Secondary | ICD-10-CM | POA: Diagnosis not present

## 2014-09-30 NOTE — Progress Notes (Signed)
Patient ID: Stephen Ellis, male   DOB: 27-Feb-1946, 69 y.o.   MRN: [id number]    Nursing Home Location:  [place] of Service: SNF (31)  PCP: Elizabeth Palau, MD  No Known Allergies  Chief Complaint  Patient presents with  . Discharge Note    HPI:  Patient is a 69 y.o. male seen today at [facility] for discharge home. Pt with pmh of hypertension, recently diagnosed stage IIIB/4 lung cancer who was hospitalized after worsening shortness of breath. Pt was admitted to rehab for strength training after hospitalization. Patient currently doing well with therapy, now stable to discharge home with home health.   Review of Systems:  Review of Systems  Constitutional: Negative for activity change, appetite change and unexpected weight change.  HENT: Negative for congestion and hearing loss.   Eyes: Negative.   Respiratory: Negative for cough and shortness of breath.   Cardiovascular: Negative for chest pain, palpitations and leg swelling.  Gastrointestinal: Negative for abdominal pain, diarrhea and constipation.  Genitourinary: Negative for dysuria and difficulty urinating.  Musculoskeletal: Negative for myalgias and arthralgias.  Skin: Negative for color change and wound.  Neurological: Negative for dizziness.  Psychiatric/Behavioral: Negative for behavioral problems, confusion and agitation.    Past Medical History  Diagnosis Date  . Syncope   . Dehydration   . Blind left eye   . Hypertension   . Anemia     "low Blood"  . Dementia     poor memory  . Cancer     Lung  . Lung cancer 08/12/14    Right lung   Past Surgical History  Procedure Laterality Date  . No past surgeries    . Video bronchoscopy Bilateral 07/29/2014    Procedure: VIDEO BRONCHOSCOPY WITHOUT FLUORO;  Surgeon: Tanda Rockers, MD;  Location: [facility] ENDOSCOPY;  Service: Cardiopulmonary;  Laterality: Bilateral;  . Video bronchoscopy Bilateral 08/12/2014    Procedure:  VIDEO BRONCHOSCOPY WITHOUT FLUORO;  Surgeon: Wilhelmina Mcardle, MD;  Location: [facility] ENDOSCOPY;  Service: Cardiopulmonary;  Laterality: Bilateral;   Social History:   reports that he quit smoking about 3 months ago. He has never used smokeless tobacco. He reports that he does not drink alcohol or use illicit drugs.  No family history on file.  Medications: Patient's Medications  New Prescriptions   No medications on file  Previous Medications   ACETAMINOPHEN-CODEINE (TYLENOL #3) 300-30 MG PER TABLET    Take 1 tablet by mouth every 4 (four) hours as needed for moderate pain. q 4h prn   AMLODIPINE (NORVASC) 10 MG TABLET    Take 10 mg by mouth daily.   BUDESONIDE-FORMOTEROL (SYMBICORT) 160-4.5 MCG/ACT INHALER    Inhale 2 puffs into the lungs 2 (two) times daily.   IPRATROPIUM-ALBUTEROL (DUONEB) 0.5-2.5 (3) MG/3ML SOLN    Take 3 mLs by nebulization every 6 (six) hours.   PROCHLORPERAZINE (COMPAZINE) 10 MG TABLET    Take 1 tablet (10 mg total) by mouth every 6 (six) hours as needed for nausea or vomiting.  Modified Medications   No medications on file  Discontinued Medications   LINEZOLID (ZYVOX) 600 MG TABLET    Take 1 tablet (600 mg total) by mouth every 12 (twelve) hours.     Physical Exam: Filed Vitals:   09/30/14 1906  BP: 112/73  Pulse: 95  Temp: 97.8 F (36.6 C)  Resp: 20    Physical Exam  Constitutional: He is oriented to person, place,  and time. He appears well-developed and well-nourished. No distress.  HENT:  Head: Normocephalic and atraumatic.  Mouth/Throat: Oropharynx is clear and moist. No oropharyngeal exudate.  Eyes: Conjunctivae and EOM are normal. Pupils are equal, round, and reactive to light.  Neck: Normal range of motion. Neck supple.  Cardiovascular: Normal rate, regular rhythm and normal heart sounds.   Pulmonary/Chest: Effort normal.  Decrease lung sounds throughout   Abdominal: Soft. Bowel sounds are normal.  Musculoskeletal: He exhibits no edema or  tenderness.  Neurological: He is alert and oriented to person, place, and time.  Skin: Skin is warm and dry. He is not diaphoretic.  Psychiatric: He has a normal mood and affect.    Labs reviewed: Basic Metabolic Panel:  Recent Labs  09/11/14 0500 09/12/14 0442 09/13/14 0523 09/22/14 1330 09/29/14 1042  NA 132* 134* 134* 138 135*  K 3.8 3.9 3.8 4.1 4.3  CL 104 104 104  --   --   CO2 21 22 22 26 24   GLUCOSE 90 87 90 144* 108  BUN 32* 26* 22 19.7 30.2*  CREATININE 2.64* 2.39* 2.13* 1.9* 1.8*  CALCIUM 8.6 8.9 8.7 9.2 9.3   Liver Function Tests:  Recent Labs  08/25/14 1128 09/22/14 1330 09/29/14 1042  AST 22 19 37*  ALT 32 74* 82*  ALKPHOS 65 81 72  BILITOT 0.42 0.35 0.41  PROT 9.5* 8.7* 8.7*  ALBUMIN 2.3* 2.5* 2.8*   No results for input(s): LIPASE, AMYLASE in the last 8760 hours. No results for input(s): AMMONIA in the last 8760 hours. CBC:  Recent Labs  09/01/14 0433  09/10/14 0420 09/22/14 1329 09/29/14 1041  WBC 5.4  < > 2.3* 4.2 1.4*  NEUTROABS 4.4  --   --  2.3 0.7*  HGB 11.7*  < > 10.7* 9.9* 9.7*  HCT 34.7*  < > 32.9* 31.1* 30.3*  MCV 87.6  < > 89.4 89.1 89.0  PLT 230  < > 124* 186 187  < > = values in this interval not displayed. TSH:  Recent Labs  08/08/14 0435  TSH 0.682   A1C: Lab Results  Component Value Date   HGBA1C 6.1* 08/08/2014   Lipid Panel: No results for input(s): CHOL, HDL, LDLCALC, TRIG, CHOLHDL, LDLDIRECT in the last 8760 hours.    Assessment/Plan 1. Bronchogenic carcinoma of right lung -cont supportive care, following with oncology as out pt  2. AKI (acute kidney injury) Improved and has remained stable  3. Anemia of chronic disease stable  4. Essential hypertension Stable cont current regimen   pt is stable for discharge-will need PT/OT per home health. DME needed and written for. Rx written.  will need to follow up with PCP within 2 weeks.

## 2014-10-07 ENCOUNTER — Other Ambulatory Visit: Payer: Self-pay | Admitting: Internal Medicine

## 2014-10-07 ENCOUNTER — Other Ambulatory Visit: Payer: Self-pay | Admitting: Medical Oncology

## 2014-10-07 ENCOUNTER — Telehealth: Payer: Self-pay | Admitting: Internal Medicine

## 2014-10-07 ENCOUNTER — Other Ambulatory Visit (HOSPITAL_BASED_OUTPATIENT_CLINIC_OR_DEPARTMENT_OTHER): Payer: Medicare Other

## 2014-10-07 ENCOUNTER — Encounter: Payer: Self-pay | Admitting: *Deleted

## 2014-10-07 DIAGNOSIS — E8809 Other disorders of plasma-protein metabolism, not elsewhere classified: Secondary | ICD-10-CM

## 2014-10-07 DIAGNOSIS — C3411 Malignant neoplasm of upper lobe, right bronchus or lung: Secondary | ICD-10-CM

## 2014-10-07 LAB — CBC WITH DIFFERENTIAL/PLATELET
BASO%: 0.8 % (ref 0.0–2.0)
BASOS ABS: 0 10*3/uL (ref 0.0–0.1)
EOS%: 1.7 % (ref 0.0–7.0)
Eosinophils Absolute: 0 10*3/uL (ref 0.0–0.5)
HEMATOCRIT: 29.5 % — AB (ref 38.4–49.9)
HEMOGLOBIN: 9.6 g/dL — AB (ref 13.0–17.1)
LYMPH#: 0.6 10*3/uL — AB (ref 0.9–3.3)
LYMPH%: 43.2 % (ref 14.0–49.0)
MCH: 29.3 pg (ref 27.2–33.4)
MCHC: 32.6 g/dL (ref 32.0–36.0)
MCV: 89.8 fL (ref 79.3–98.0)
MONO#: 0.3 10*3/uL (ref 0.1–0.9)
MONO%: 24.5 % — ABNORMAL HIGH (ref 0.0–14.0)
NEUT%: 29.8 % — ABNORMAL LOW (ref 39.0–75.0)
NEUTROS ABS: 0.4 10*3/uL — AB (ref 1.5–6.5)
PLATELETS: 101 10*3/uL — AB (ref 140–400)
RBC: 3.29 10*6/uL — ABNORMAL LOW (ref 4.20–5.82)
RDW: 15.1 % — ABNORMAL HIGH (ref 11.0–14.6)
WBC: 1.3 10*3/uL — ABNORMAL LOW (ref 4.0–10.3)

## 2014-10-07 LAB — COMPREHENSIVE METABOLIC PANEL (CC13)
ALK PHOS: 80 U/L (ref 40–150)
ALT: 67 U/L — ABNORMAL HIGH (ref 0–55)
AST: 26 U/L (ref 5–34)
Albumin: 2.8 g/dL — ABNORMAL LOW (ref 3.5–5.0)
Anion Gap: 9 mEq/L (ref 3–11)
BUN: 16.5 mg/dL (ref 7.0–26.0)
CALCIUM: 8.9 mg/dL (ref 8.4–10.4)
CO2: 25 mEq/L (ref 22–29)
CREATININE: 1.5 mg/dL — AB (ref 0.7–1.3)
Chloride: 106 mEq/L (ref 98–109)
EGFR: 54 mL/min/{1.73_m2} — ABNORMAL LOW (ref >90)
Glucose: 120 mg/dl (ref 70–140)
POTASSIUM: 3.8 meq/L (ref 3.5–5.1)
Sodium: 139 mEq/L (ref 136–145)
Total Bilirubin: 0.31 mg/dL (ref 0.20–1.20)
Total Protein: 8.1 g/dL (ref 6.4–8.3)

## 2014-10-07 NOTE — Telephone Encounter (Signed)
Left message to confirm Inj appt.

## 2014-10-08 ENCOUNTER — Other Ambulatory Visit: Payer: Self-pay | Admitting: Internal Medicine

## 2014-10-08 ENCOUNTER — Ambulatory Visit (HOSPITAL_BASED_OUTPATIENT_CLINIC_OR_DEPARTMENT_OTHER): Payer: Medicare Other

## 2014-10-08 DIAGNOSIS — Z5189 Encounter for other specified aftercare: Secondary | ICD-10-CM

## 2014-10-08 DIAGNOSIS — C3411 Malignant neoplasm of upper lobe, right bronchus or lung: Secondary | ICD-10-CM

## 2014-10-08 DIAGNOSIS — D72819 Decreased white blood cell count, unspecified: Secondary | ICD-10-CM

## 2014-10-08 MED ORDER — TBO-FILGRASTIM 300 MCG/0.5ML ~~LOC~~ SOSY
300.0000 ug | PREFILLED_SYRINGE | Freq: Once | SUBCUTANEOUS | Status: AC
  Administered 2014-10-08: 300 ug via SUBCUTANEOUS
  Filled 2014-10-08: qty 0.5

## 2014-10-08 NOTE — Patient Instructions (Signed)

## 2014-10-09 ENCOUNTER — Ambulatory Visit (HOSPITAL_BASED_OUTPATIENT_CLINIC_OR_DEPARTMENT_OTHER): Payer: Medicare Other

## 2014-10-09 ENCOUNTER — Other Ambulatory Visit: Payer: Self-pay | Admitting: Medical Oncology

## 2014-10-09 DIAGNOSIS — C3411 Malignant neoplasm of upper lobe, right bronchus or lung: Secondary | ICD-10-CM

## 2014-10-09 DIAGNOSIS — D72819 Decreased white blood cell count, unspecified: Secondary | ICD-10-CM

## 2014-10-09 MED ORDER — TBO-FILGRASTIM 300 MCG/0.5ML ~~LOC~~ SOSY
300.0000 ug | PREFILLED_SYRINGE | Freq: Once | SUBCUTANEOUS | Status: AC
  Administered 2014-10-09: 300 ug via SUBCUTANEOUS
  Filled 2014-10-09: qty 0.5

## 2014-10-14 ENCOUNTER — Other Ambulatory Visit: Payer: Self-pay | Admitting: Internal Medicine

## 2014-10-14 ENCOUNTER — Ambulatory Visit (HOSPITAL_BASED_OUTPATIENT_CLINIC_OR_DEPARTMENT_OTHER): Payer: Medicare Other | Admitting: Nurse Practitioner

## 2014-10-14 ENCOUNTER — Other Ambulatory Visit: Payer: Self-pay | Admitting: *Deleted

## 2014-10-14 ENCOUNTER — Other Ambulatory Visit (HOSPITAL_BASED_OUTPATIENT_CLINIC_OR_DEPARTMENT_OTHER): Payer: Medicare Other

## 2014-10-14 ENCOUNTER — Encounter: Payer: Medicare Other | Admitting: Nurse Practitioner

## 2014-10-14 ENCOUNTER — Ambulatory Visit (HOSPITAL_BASED_OUTPATIENT_CLINIC_OR_DEPARTMENT_OTHER): Payer: Medicare Other

## 2014-10-14 DIAGNOSIS — C3411 Malignant neoplasm of upper lobe, right bronchus or lung: Secondary | ICD-10-CM

## 2014-10-14 DIAGNOSIS — E8809 Other disorders of plasma-protein metabolism, not elsewhere classified: Secondary | ICD-10-CM

## 2014-10-14 DIAGNOSIS — D61810 Antineoplastic chemotherapy induced pancytopenia: Secondary | ICD-10-CM

## 2014-10-14 DIAGNOSIS — T451X5A Adverse effect of antineoplastic and immunosuppressive drugs, initial encounter: Secondary | ICD-10-CM

## 2014-10-14 DIAGNOSIS — C3491 Malignant neoplasm of unspecified part of right bronchus or lung: Secondary | ICD-10-CM

## 2014-10-14 DIAGNOSIS — Z5111 Encounter for antineoplastic chemotherapy: Secondary | ICD-10-CM

## 2014-10-14 DIAGNOSIS — E876 Hypokalemia: Secondary | ICD-10-CM

## 2014-10-14 LAB — COMPREHENSIVE METABOLIC PANEL (CC13)
ALT: 37 U/L (ref 0–55)
AST: 19 U/L (ref 5–34)
Albumin: 2.8 g/dL — ABNORMAL LOW (ref 3.5–5.0)
Alkaline Phosphatase: 83 U/L (ref 40–150)
Anion Gap: 9 mEq/L (ref 3–11)
BUN: 11.5 mg/dL (ref 7.0–26.0)
CALCIUM: 9.2 mg/dL (ref 8.4–10.4)
CHLORIDE: 106 meq/L (ref 98–109)
CO2: 23 mEq/L (ref 22–29)
CREATININE: 1.4 mg/dL — AB (ref 0.7–1.3)
EGFR: 61 mL/min/{1.73_m2} — ABNORMAL LOW (ref >90)
GLUCOSE: 98 mg/dL (ref 70–140)
Potassium: 3.4 mEq/L — ABNORMAL LOW (ref 3.5–5.1)
Sodium: 138 mEq/L (ref 136–145)
Total Bilirubin: 0.44 mg/dL (ref 0.20–1.20)
Total Protein: 8.2 g/dL (ref 6.4–8.3)

## 2014-10-14 LAB — CBC WITH DIFFERENTIAL/PLATELET
BASO%: 0.6 % (ref 0.0–2.0)
Basophils Absolute: 0 10*3/uL (ref 0.0–0.1)
EOS%: 0.5 % (ref 0.0–7.0)
Eosinophils Absolute: 0 10*3/uL (ref 0.0–0.5)
HCT: 30.7 % — ABNORMAL LOW (ref 38.4–49.9)
HEMOGLOBIN: 9.9 g/dL — AB (ref 13.0–17.1)
LYMPH%: 26.2 % (ref 14.0–49.0)
MCH: 29.4 pg (ref 27.2–33.4)
MCHC: 32.2 g/dL (ref 32.0–36.0)
MCV: 91.4 fL (ref 79.3–98.0)
MONO#: 0.9 10*3/uL (ref 0.1–0.9)
MONO%: 27.3 % — AB (ref 0.0–14.0)
NEUT#: 1.5 10*3/uL (ref 1.5–6.5)
NEUT%: 45.4 % (ref 39.0–75.0)
Platelets: 93 10*3/uL — ABNORMAL LOW (ref 140–400)
RBC: 3.36 10*6/uL — AB (ref 4.20–5.82)
RDW: 17.9 % — ABNORMAL HIGH (ref 11.0–14.6)
WBC: 3.2 10*3/uL — ABNORMAL LOW (ref 4.0–10.3)
lymph#: 0.8 10*3/uL — ABNORMAL LOW (ref 0.9–3.3)

## 2014-10-14 MED ORDER — FAMOTIDINE IN NACL 20-0.9 MG/50ML-% IV SOLN
INTRAVENOUS | Status: AC
  Filled 2014-10-14: qty 50

## 2014-10-14 MED ORDER — SODIUM CHLORIDE 0.9 % IV SOLN
Freq: Once | INTRAVENOUS | Status: AC
  Administered 2014-10-14: 10:00:00 via INTRAVENOUS

## 2014-10-14 MED ORDER — DEXAMETHASONE SODIUM PHOSPHATE 20 MG/5ML IJ SOLN
20.0000 mg | Freq: Once | INTRAMUSCULAR | Status: AC
  Administered 2014-10-14: 20 mg via INTRAVENOUS

## 2014-10-14 MED ORDER — DIPHENHYDRAMINE HCL 50 MG/ML IJ SOLN
INTRAMUSCULAR | Status: AC
  Filled 2014-10-14: qty 1

## 2014-10-14 MED ORDER — DEXAMETHASONE SODIUM PHOSPHATE 20 MG/5ML IJ SOLN
INTRAMUSCULAR | Status: AC
  Filled 2014-10-14: qty 5

## 2014-10-14 MED ORDER — FAMOTIDINE IN NACL 20-0.9 MG/50ML-% IV SOLN
20.0000 mg | Freq: Once | INTRAVENOUS | Status: AC
  Administered 2014-10-14: 20 mg via INTRAVENOUS

## 2014-10-14 MED ORDER — CARBOPLATIN CHEMO INJECTION 600 MG/60ML
404.5000 mg | Freq: Once | INTRAVENOUS | Status: AC
  Administered 2014-10-14: 400 mg via INTRAVENOUS
  Filled 2014-10-14: qty 40

## 2014-10-14 MED ORDER — PACLITAXEL CHEMO INJECTION 300 MG/50ML
175.0000 mg/m2 | Freq: Once | INTRAVENOUS | Status: AC
  Administered 2014-10-14: 342 mg via INTRAVENOUS
  Filled 2014-10-14: qty 57

## 2014-10-14 MED ORDER — AMLODIPINE BESYLATE 10 MG PO TABS: 10.0000 mg | ORAL_TABLET | Freq: Every day | ORAL | Status: DC

## 2014-10-14 MED ORDER — ONDANSETRON 16 MG/50ML IVPB (CHCC)
16.0000 mg | Freq: Once | INTRAVENOUS | Status: AC
  Administered 2014-10-14: 16 mg via INTRAVENOUS

## 2014-10-14 MED ORDER — DIPHENHYDRAMINE HCL 50 MG/ML IJ SOLN
50.0000 mg | Freq: Once | INTRAMUSCULAR | Status: AC
  Administered 2014-10-14: 50 mg via INTRAVENOUS

## 2014-10-14 MED ORDER — BUDESONIDE-FORMOTEROL FUMARATE 160-4.5 MCG/ACT IN AERO: 2.0000 | INHALATION_SPRAY | Freq: Two times a day (BID) | RESPIRATORY_TRACT | Status: DC

## 2014-10-14 MED ORDER — ONDANSETRON 16 MG/50ML IVPB (CHCC)
INTRAVENOUS | Status: AC
  Filled 2014-10-14: qty 16

## 2014-10-14 NOTE — Patient Instructions (Signed)
Stephen Ellis Discharge Instructions for Patients Receiving Chemotherapy  Today you received the following chemotherapy agents Taxol, Carboplatin  To help prevent nausea and vomiting after your treatment, we encourage you to take your nausea medication as directed.   If you develop nausea and vomiting that is not controlled by your nausea medication, call the clinic.   BELOW ARE SYMPTOMS THAT SHOULD BE REPORTED IMMEDIATELY:  *FEVER GREATER THAN 100.5 F  *CHILLS WITH OR WITHOUT FEVER  NAUSEA AND VOMITING THAT IS NOT CONTROLLED WITH YOUR NAUSEA MEDICATION  *UNUSUAL SHORTNESS OF BREATH  *UNUSUAL BRUISING OR BLEEDING  TENDERNESS IN MOUTH AND THROAT WITH OR WITHOUT PRESENCE OF ULCERS  *URINARY PROBLEMS  *BOWEL PROBLEMS  UNUSUAL RASH Items with * indicate a potential emergency and should be followed up as soon as possible.  Feel free to call the clinic you have any questions or concerns. The clinic phone number is [phone].

## 2014-10-14 NOTE — Progress Notes (Signed)
Per Dr. Julien Nordmann, okay to tx with platelets 93.

## 2014-10-15 ENCOUNTER — Encounter: Payer: Self-pay | Admitting: Nurse Practitioner

## 2014-10-15 ENCOUNTER — Ambulatory Visit (HOSPITAL_BASED_OUTPATIENT_CLINIC_OR_DEPARTMENT_OTHER): Payer: Medicare Other

## 2014-10-15 DIAGNOSIS — C3411 Malignant neoplasm of upper lobe, right bronchus or lung: Secondary | ICD-10-CM

## 2014-10-15 DIAGNOSIS — C3491 Malignant neoplasm of unspecified part of right bronchus or lung: Secondary | ICD-10-CM

## 2014-10-15 DIAGNOSIS — D61810 Antineoplastic chemotherapy induced pancytopenia: Secondary | ICD-10-CM | POA: Insufficient documentation

## 2014-10-15 DIAGNOSIS — E876 Hypokalemia: Secondary | ICD-10-CM | POA: Insufficient documentation

## 2014-10-15 DIAGNOSIS — T451X5A Adverse effect of antineoplastic and immunosuppressive drugs, initial encounter: Secondary | ICD-10-CM

## 2014-10-15 MED ORDER — PEGFILGRASTIM INJECTION 6 MG/0.6ML ~~LOC~~
6.0000 mg | PREFILLED_SYRINGE | Freq: Once | SUBCUTANEOUS | Status: AC
  Administered 2014-10-15: 6 mg via SUBCUTANEOUS
  Filled 2014-10-15: qty 0.6

## 2014-10-15 NOTE — Assessment & Plan Note (Signed)
Potassium slightly low at 3.4 today.  Patient was encouraged to push potassium-rich diet is much as possible.

## 2014-10-15 NOTE — Assessment & Plan Note (Addendum)
Patient presents to the [facility] today for to receive cycle 2 of his carboplatin/paclitaxel chemotherapy regimen.  Briefly reviewed all lab results with both patient and his family member today.  ANC has improved from 0.4 up to 1.5.  Hemoglobin remained steady at 9.9.  Platelet count has slightly dropped from 101 down to 93.  On exam today-patient was sitting up on the edge of the bed eating a ham sandwich.  He appears to be tolerating his chemotherapy fairly well.  Patient will proceed with cycle 2 today as planned.  He will return tomorrow for his Neulasta injection.  He will return on March 1 for his next cycle of chemotherapy.  He will also continue with weekly lab draws.  Also, patient was given refills of both the Symbicort inhaler and Norvasc today per his request.

## 2014-10-15 NOTE — Progress Notes (Signed)
SYMPTOM MANAGEMENT CLINIC   HPI: Stephen Ellis 69 y.o. male diagnosed with lung cancer.  Patient is status post palliative radiation therapy completed on 09/19/2014.  Patient is currently undergoing carboplatin/paclitaxel chemotherapy regimen.  Patient presents to the [facility] today to receive cycle 2 of his carboplatin/paclitaxel chemotherapy therapy regimen.  He states he's been doing fairly well recently.  He continues to reside at [facility] living facility.  He denies any issues with worsening fatigue or appetite.  He denies any recent fevers or chills.  He is requesting a refill of both his Symbicort and his Norvasc today.   HPI  CURRENT THERAPY: Upcoming Treatment Dates - LUNG Carboplatin / Paclitaxel q21d Dose Reduction Days with orders from any treatment category:  11/04/2014      SCHEDULING COMMUNICATION      diphenhydrAMINE (BENADRYL) injection 50 mg      Dexamethasone Sodium Phosphate (DECADRON) injection 20 mg      ondansetron (ZOFRAN) IVPB 16 mg      famotidine (PEPCID) IVPB 20 mg      PACLitaxel (TAXOL) 342 mg in dextrose 5 % 500 mL chemo infusion (> $RemoveBef'80mg'DOwxhOZhXW$ /m2)      CARBOplatin (PARAPLATIN) in sodium chloride 0.9 % 100 mL chemo infusion      sodium chloride 0.9 % injection 10 mL      heparin lock flush 100 unit/mL      heparin lock flush 100 unit/mL      alteplase (CATHFLO ACTIVASE) injection 2 mg      sodium chloride 0.9 % injection 3 mL      Cold Pack 1 packet      diphenhydrAMINE (BENADRYL) injection 25 mg      famotidine (PEPCID) IVPB 20 mg      0.9 %  sodium chloride infusion      methylPREDNISolone sodium succinate (SOLU-MEDROL) 125 mg/2 mL injection 125 mg      EPINEPHrine (ADRENALIN) 0.1 MG/ML injection 0.25 mg      EPINEPHrine (ADRENALIN) 0.1 MG/ML injection 0.25 mg      EPINEPHrine (ADRENALIN) injection 0.5 mg      EPINEPHrine (ADRENALIN) injection 0.5 mg      diphenhydrAMINE (BENADRYL) injection 50 mg      albuterol (PROVENTIL) (2.5 MG/3ML) 0.083%  nebulizer solution 2.5 mg      0.9 %  sodium chloride infusion      TREATMENT CONDITIONS 11/05/2014      SCHEDULING COMMUNICATION INJECTION      pegfilgrastim (NEULASTA) injection 6 mg 11/25/2014      SCHEDULING COMMUNICATION      diphenhydrAMINE (BENADRYL) injection 50 mg      Dexamethasone Sodium Phosphate (DECADRON) injection 20 mg      ondansetron (ZOFRAN) IVPB 16 mg      famotidine (PEPCID) IVPB 20 mg      PACLitaxel (TAXOL) 342 mg in dextrose 5 % 500 mL chemo infusion (> $RemoveBef'80mg'VZhBWpuwQG$ /m2)      CARBOplatin (PARAPLATIN) in sodium chloride 0.9 % 100 mL chemo infusion      sodium chloride 0.9 % injection 10 mL      heparin lock flush 100 unit/mL      heparin lock flush 100 unit/mL      alteplase (CATHFLO ACTIVASE) injection 2 mg      sodium chloride 0.9 % injection 3 mL      Cold Pack 1 packet      diphenhydrAMINE (BENADRYL) injection 25 mg      famotidine (PEPCID) IVPB 20 mg  0.9 %  sodium chloride infusion      methylPREDNISolone sodium succinate (SOLU-MEDROL) 125 mg/2 mL injection 125 mg      EPINEPHrine (ADRENALIN) 0.1 MG/ML injection 0.25 mg      EPINEPHrine (ADRENALIN) 0.1 MG/ML injection 0.25 mg      EPINEPHrine (ADRENALIN) injection 0.5 mg      EPINEPHrine (ADRENALIN) injection 0.5 mg      diphenhydrAMINE (BENADRYL) injection 50 mg      albuterol (PROVENTIL) (2.5 MG/3ML) 0.083% nebulizer solution 2.5 mg      0.9 %  sodium chloride infusion      TREATMENT CONDITIONS    ROS  Past Medical History  Diagnosis Date  . Syncope   . Dehydration   . Blind left eye   . Hypertension   . Anemia     "low Blood"  . Dementia     poor memory  . Cancer     Lung  . Lung cancer 08/12/14    Right lung    Past Surgical History  Procedure Laterality Date  . No past surgeries    . Video bronchoscopy Bilateral 07/29/2014    Procedure: VIDEO BRONCHOSCOPY WITHOUT FLUORO;  Surgeon: Tanda Rockers, MD;  Location: [facility] ENDOSCOPY;  Service: Cardiopulmonary;  Laterality: Bilateral;  . Video  bronchoscopy Bilateral 08/12/2014    Procedure: VIDEO BRONCHOSCOPY WITHOUT FLUORO;  Surgeon: Wilhelmina Mcardle, MD;  Location: [facility] ENDOSCOPY;  Service: Cardiopulmonary;  Laterality: Bilateral;    has Plasma cell dyscrasia; Postobstructive pneumonia; HCAP (healthcare-associated pneumonia); Essential hypertension; Bronchogenic carcinoma of right lung; Acute respiratory failure with hypoxia; Anemia of chronic disease; Leukopenia; Pleural effusion, right; AKI (acute kidney injury); Hyponatremia; Hypokalemia; and Pancytopenia due to antineoplastic chemotherapy on his problem list.    has No Known Allergies.    Medication List       This list is accurate as of: 10/14/14 11:59 PM.  Always use your most recent med list.               acetaminophen-codeine 300-30 MG per tablet  Commonly known as:  TYLENOL #3  Take 1 tablet by mouth every 4 (four) hours as needed for moderate pain. q 4h prn     amLODipine 10 MG tablet  Commonly known as:  NORVASC  Take 1 tablet (10 mg total) by mouth daily.     budesonide-formoterol 160-4.5 MCG/ACT inhaler  Commonly known as:  SYMBICORT  Inhale 2 puffs into the lungs 2 (two) times daily.     ipratropium-albuterol 0.5-2.5 (3) MG/3ML Soln  Commonly known as:  DUONEB  Take 3 mLs by nebulization every 6 (six) hours.     prochlorperazine 10 MG tablet  Commonly known as:  COMPAZINE  Take 1 tablet (10 mg total) by mouth every 6 (six) hours as needed for nausea or vomiting.         PHYSICAL EXAMINATION  Vitals: BP 107/81, 82, temp 98.0, 100%  Physical Exam  Constitutional: He is oriented to person, place, and time. Vital signs are normal. He appears unhealthy.  HENT:  Head: Normocephalic and atraumatic.  Mouth/Throat: Oropharynx is clear and moist.  Eyes: Conjunctivae and EOM are normal. Pupils are equal, round, and reactive to light. Right eye exhibits no discharge. Left eye exhibits no discharge. No scleral icterus.  Neck: Normal range of motion. Neck  supple. No JVD present. No tracheal deviation present. No thyromegaly present.  Cardiovascular: Normal rate, regular rhythm, normal heart sounds and intact distal pulses.   Pulmonary/Chest: Effort  normal and breath sounds normal. No respiratory distress. He has no wheezes. He has no rales. He exhibits no tenderness.  Abdominal: Soft. Bowel sounds are normal. He exhibits no distension and no mass. There is no tenderness. There is no rebound and no guarding.  Musculoskeletal: Normal range of motion. He exhibits no edema or tenderness.  Lymphadenopathy:    He has no cervical adenopathy.  Neurological: He is alert and oriented to person, place, and time.  Skin: Skin is warm and dry. No rash noted. No erythema. No pallor.  Psychiatric: Affect normal.  Nursing note and vitals reviewed.   LABORATORY DATA:. Appointment on 10/14/2014  Component Date Value Ref Range Status  . WBC 10/14/2014 3.2* 4.0 - 10.3 10e3/uL Final  . NEUT# 10/14/2014 1.5  1.5 - 6.5 10e3/uL Final  . HGB 10/14/2014 9.9* 13.0 - 17.1 g/dL Final  . HCT 10/14/2014 30.7* 38.4 - 49.9 % Final  . Platelets 10/14/2014 93* 140 - 400 10e3/uL Final  . MCV 10/14/2014 91.4  79.3 - 98.0 fL Final  . MCH 10/14/2014 29.4  27.2 - 33.4 pg Final  . MCHC 10/14/2014 32.2  32.0 - 36.0 g/dL Final  . RBC 10/14/2014 3.36* 4.20 - 5.82 10e6/uL Final  . RDW 10/14/2014 17.9* 11.0 - 14.6 % Final  . lymph# 10/14/2014 0.8* 0.9 - 3.3 10e3/uL Final  . MONO# 10/14/2014 0.9  0.1 - 0.9 10e3/uL Final  . Eosinophils Absolute 10/14/2014 0.0  0.0 - 0.5 10e3/uL Final  . Basophils Absolute 10/14/2014 0.0  0.0 - 0.1 10e3/uL Final  . NEUT% 10/14/2014 45.4  39.0 - 75.0 % Final  . LYMPH% 10/14/2014 26.2  14.0 - 49.0 % Final  . MONO% 10/14/2014 27.3* 0.0 - 14.0 % Final  . EOS% 10/14/2014 0.5  0.0 - 7.0 % Final  . BASO% 10/14/2014 0.6  0.0 - 2.0 % Final  . Sodium 10/14/2014 138  136 - 145 mEq/L Final  . Potassium 10/14/2014 3.4* 3.5 - 5.1 mEq/L Final  . Chloride  10/14/2014 106  98 - 109 mEq/L Final  . CO2 10/14/2014 23  22 - 29 mEq/L Final  . Glucose 10/14/2014 98  70 - 140 mg/dl Final  . BUN 10/14/2014 11.5  7.0 - 26.0 mg/dL Final  . Creatinine 10/14/2014 1.4* 0.7 - 1.3 mg/dL Final  . Total Bilirubin 10/14/2014 0.44  0.20 - 1.20 mg/dL Final  . Alkaline Phosphatase 10/14/2014 83  40 - 150 U/L Final  . AST 10/14/2014 19  5 - 34 U/L Final  . ALT 10/14/2014 37  0 - 55 U/L Final  . Total Protein 10/14/2014 8.2  6.4 - 8.3 g/dL Final  . Albumin 10/14/2014 2.8* 3.5 - 5.0 g/dL Final  . Calcium 10/14/2014 9.2  8.4 - 10.4 mg/dL Final  . Anion Gap 10/14/2014 9  3 - 11 mEq/L Final  . EGFR 10/14/2014 61* >90 ml/min/1.73 m2 Final   eGFR is calculated using the CKD-EPI Creatinine Equation (2009)     RADIOGRAPHIC STUDIES: No results found.  ASSESSMENT/PLAN:    Bronchogenic carcinoma of right lung Patient presents to the [facility] today for to receive cycle 2 of his carboplatin/paclitaxel chemotherapy regimen.  Briefly reviewed all lab results with both patient and his family member today.  ANC has improved from 0.4 up to 1.5.  Hemoglobin remained steady at 9.9.  Platelet count has slightly dropped from 101 down to 93.  On exam today-patient was sitting up on the edge of the bed eating a ham sandwich.  He appears to be tolerating his chemotherapy fairly well.  Patient will proceed with cycle 2 today as planned.  He will return tomorrow for his Neulasta injection.  He will return on March 1 for his next cycle of chemotherapy.  He will also continue with weekly lab draws.  Also, patient was given refills of both the Symbicort inhaler and Norvasc today per his request.   Hypokalemia Potassium slightly low at 3.4 today.  Patient was encouraged to push potassium-rich diet is much as possible.   Pancytopenia due to antineoplastic chemotherapy Chemotherapy-induced pancytopenia has improved today.  ANC is improved from 0.4 up to 1.5.  Hemoglobin remained stable  at 9.9.  Blood count has slowly decreased from 101 down to 93.  Patient denies any worsening issues with either easy bleeding or bruising.  Will continue to monitor closely.   Patient stated understanding of all instructions; and was in agreement with this plan of care. The patient knows to call the clinic with any problems, questions or concerns.   Review/collaboration with Dr. Julien Nordmann regarding all aspects of patient's visit today.   Total time spent with patient was 25 minutes;  with greater than 75 percent of that time spent in face to face counseling regarding patient's symptoms,  and coordination of care and follow up.  Disclaimer: This note was dictated with voice recognition software. Similar sounding words can inadvertently be transcribed and may not be corrected upon review.   Drue Second, NP 10/15/2014

## 2014-10-15 NOTE — Assessment & Plan Note (Signed)
Chemotherapy-induced pancytopenia has improved today.  ANC is improved from 0.4 up to 1.5.  Hemoglobin remained stable at 9.9.  Blood count has slowly decreased from 101 down to 93.  Patient denies any worsening issues with either easy bleeding or bruising.  Will continue to monitor closely.

## 2014-10-19 ENCOUNTER — Emergency Department (HOSPITAL_COMMUNITY): Payer: Medicare Other

## 2014-10-19 ENCOUNTER — Encounter (HOSPITAL_COMMUNITY): Payer: Self-pay

## 2014-10-19 ENCOUNTER — Emergency Department (HOSPITAL_COMMUNITY)
Admission: EM | Admit: 2014-10-19 | Discharge: 2014-10-19 | Disposition: A | Discharge status: Home / Self Care | Payer: Medicare Other | Attending: Emergency Medicine | Admitting: Emergency Medicine

## 2014-10-19 DIAGNOSIS — J209 Acute bronchitis, unspecified: Secondary | ICD-10-CM | POA: Diagnosis not present

## 2014-10-19 DIAGNOSIS — Z87828 Personal history of other (healed) physical injury and trauma: Secondary | ICD-10-CM | POA: Diagnosis not present

## 2014-10-19 DIAGNOSIS — F0390 Unspecified dementia without behavioral disturbance: Secondary | ICD-10-CM | POA: Insufficient documentation

## 2014-10-19 DIAGNOSIS — Z79899 Other long term (current) drug therapy: Secondary | ICD-10-CM | POA: Diagnosis not present

## 2014-10-19 DIAGNOSIS — R350 Frequency of micturition: Secondary | ICD-10-CM | POA: Insufficient documentation

## 2014-10-19 DIAGNOSIS — H5442 Blindness, left eye, normal vision right eye: Secondary | ICD-10-CM | POA: Insufficient documentation

## 2014-10-19 DIAGNOSIS — R062 Wheezing: Secondary | ICD-10-CM | POA: Diagnosis present

## 2014-10-19 DIAGNOSIS — Z85118 Personal history of other malignant neoplasm of bronchus and lung: Secondary | ICD-10-CM | POA: Insufficient documentation

## 2014-10-19 DIAGNOSIS — Z87891 Personal history of nicotine dependence: Secondary | ICD-10-CM | POA: Diagnosis not present

## 2014-10-19 DIAGNOSIS — J40 Bronchitis, not specified as acute or chronic: Secondary | ICD-10-CM

## 2014-10-19 DIAGNOSIS — R3915 Urgency of urination: Secondary | ICD-10-CM | POA: Diagnosis not present

## 2014-10-19 DIAGNOSIS — I10 Essential (primary) hypertension: Secondary | ICD-10-CM | POA: Insufficient documentation

## 2014-10-19 DIAGNOSIS — D696 Thrombocytopenia, unspecified: Secondary | ICD-10-CM | POA: Diagnosis not present

## 2014-10-19 DIAGNOSIS — R630 Anorexia: Secondary | ICD-10-CM | POA: Insufficient documentation

## 2014-10-19 DIAGNOSIS — D649 Anemia, unspecified: Secondary | ICD-10-CM | POA: Diagnosis not present

## 2014-10-19 DIAGNOSIS — Z8639 Personal history of other endocrine, nutritional and metabolic disease: Secondary | ICD-10-CM | POA: Insufficient documentation

## 2014-10-19 LAB — DIFFERENTIAL
BASOS ABS: 0 10*3/uL (ref 0.0–0.1)
Basophils Relative: 1 % (ref 0–1)
EOS PCT: 1 % (ref 0–5)
Eosinophils Absolute: 0 10*3/uL (ref 0.0–0.7)
LYMPHS ABS: 0.5 10*3/uL — AB (ref 0.7–4.0)
Lymphocytes Relative: 15 % (ref 12–46)
MONO ABS: 0.1 10*3/uL (ref 0.1–1.0)
MONOS PCT: 3 % (ref 3–12)
Neutro Abs: 2.7 10*3/uL (ref 1.7–7.7)
Neutrophils Relative %: 80 % — ABNORMAL HIGH (ref 43–77)

## 2014-10-19 LAB — URINALYSIS, ROUTINE W REFLEX MICROSCOPIC
Bilirubin Urine: NEGATIVE
Glucose, UA: NEGATIVE mg/dL
Hgb urine dipstick: NEGATIVE
KETONES UR: NEGATIVE mg/dL
Leukocytes, UA: NEGATIVE
Nitrite: NEGATIVE
PH: 6.5 (ref 5.0–8.0)
Protein, ur: NEGATIVE mg/dL
Specific Gravity, Urine: 1.016 (ref 1.005–1.030)
UROBILINOGEN UA: 0.2 mg/dL (ref 0.0–1.0)

## 2014-10-19 LAB — CBC
HEMATOCRIT: 25.7 % — AB (ref 39.0–52.0)
Hemoglobin: 8.3 g/dL — ABNORMAL LOW (ref 13.0–17.0)
MCH: 30 pg (ref 26.0–34.0)
MCHC: 32.3 g/dL (ref 30.0–36.0)
MCV: 92.8 fL (ref 78.0–100.0)
PLATELETS: 45 10*3/uL — AB (ref 150–400)
RBC: 2.77 MIL/uL — AB (ref 4.22–5.81)
RDW: 18 % — ABNORMAL HIGH (ref 11.5–15.5)
WBC: 3.3 10*3/uL — AB (ref 4.0–10.5)

## 2014-10-19 LAB — COMPREHENSIVE METABOLIC PANEL
ALT: 41 U/L (ref 0–53)
AST: 24 U/L (ref 0–37)
Albumin: 3.1 g/dL — ABNORMAL LOW (ref 3.5–5.2)
Alkaline Phosphatase: 70 U/L (ref 39–117)
Anion gap: 4 — ABNORMAL LOW (ref 5–15)
BUN: 17 mg/dL (ref 6–23)
CALCIUM: 9.8 mg/dL (ref 8.4–10.5)
CO2: 28 mmol/L (ref 19–32)
Chloride: 106 mmol/L (ref 96–112)
Creatinine, Ser: 1.24 mg/dL (ref 0.50–1.35)
GFR, EST AFRICAN AMERICAN: 68 mL/min — AB (ref >90)
GFR, EST NON AFRICAN AMERICAN: 59 mL/min — AB (ref >90)
GLUCOSE: 103 mg/dL — AB (ref 70–99)
POTASSIUM: 4.4 mmol/L (ref 3.5–5.1)
SODIUM: 138 mmol/L (ref 135–145)
Total Bilirubin: 0.8 mg/dL (ref 0.3–1.2)
Total Protein: 8 g/dL (ref 6.0–8.3)

## 2014-10-19 LAB — I-STAT CG4 LACTIC ACID, ED: Lactic Acid, Venous: 0.66 mmol/L (ref 0.5–2.0)

## 2014-10-19 MED ORDER — DOXYCYCLINE HYCLATE 100 MG PO CAPS: 100.0000 mg | ORAL_CAPSULE | Freq: Two times a day (BID) | ORAL | Status: DC

## 2014-10-19 MED ORDER — ALBUTEROL SULFATE (2.5 MG/3ML) 0.083% IN NEBU: 2.5000 mg | INHALATION_SOLUTION | Freq: Four times a day (QID) | RESPIRATORY_TRACT | Status: DC | PRN

## 2014-10-19 NOTE — ED Notes (Signed)
Patient c/o expiratory wheezing and urinary frequency x 2 days. Patient has a histosry of lung cancer.

## 2014-10-19 NOTE — ED Provider Notes (Signed)
CSN: [id number]     Arrival date & time 10/19/14  1234 History   First MD Initiated Contact with Patient 10/19/14 1421     Chief Complaint  Patient presents with  . Wheezing  . Urinary Frequency     (Consider location/radiation/quality/duration/timing/severity/associated sxs/prior Treatment) HPI Comments: Patient from home with 3 day history of wheezing per family members. In history of urinary frequency and urgency. Patient had an episode of confusion yesterday which is now resolved. Patient with history of lung cancer last chemotherapy 5 days ago. No fever. Denies any chest pain, chills, abdominal pain, nausea or vomiting. Does not have any shortness of breath now. He takes Symbicort but has not been using his nebulizer at home because he does not have medications. Denies any leg pain or leg swelling. Denies any abdominal pain. He's had urinary urgency without pain.  The history is provided by the patient and a relative.    Past Medical History  Diagnosis Date  . Syncope   . Dehydration   . Blind left eye   . Hypertension   . Anemia     "low Blood"  . Dementia     poor memory  . Cancer     Lung  . Lung cancer 08/12/14    Right lung   Past Surgical History  Procedure Laterality Date  . No past surgeries    . Video bronchoscopy Bilateral 07/29/2014    Procedure: VIDEO BRONCHOSCOPY WITHOUT FLUORO;  Surgeon: Tanda Rockers, MD;  Location: [facility] ENDOSCOPY;  Service: Cardiopulmonary;  Laterality: Bilateral;  . Video bronchoscopy Bilateral 08/12/2014    Procedure: VIDEO BRONCHOSCOPY WITHOUT FLUORO;  Surgeon: Wilhelmina Mcardle, MD;  Location: [facility] ENDOSCOPY;  Service: Cardiopulmonary;  Laterality: Bilateral;   History reviewed. No pertinent family history. History  Substance Use Topics  . Smoking status: Former Smoker -- 1.00 packs/day for 38 years    Quit date: 06/24/2014  . Smokeless tobacco: Never Used  . Alcohol Use: No    Review of Systems  Constitutional: Positive for activity  change, appetite change and fatigue.  HENT: Negative for congestion and rhinorrhea.   Respiratory: Positive for cough and shortness of breath.   Gastrointestinal: Negative for nausea, vomiting and abdominal pain.  Genitourinary: Positive for urgency. Negative for dysuria and hematuria.  Musculoskeletal: Negative for myalgias and arthralgias.  Skin: Negative for rash.  Neurological: Positive for weakness. Negative for dizziness and headaches.  A complete 10 system review of systems was obtained and all systems are negative except as noted in the HPI and PMH.      Allergies  Review of patient's allergies indicates no known allergies.  Home Medications   Prior to Admission medications   Medication Sig Start Date End Date Taking? Authorizing Provider  amLODipine (NORVASC) 10 MG tablet Take 1 tablet (10 mg total) by mouth daily. 10/14/14  Yes Drue Second, NP  budesonide-formoterol (SYMBICORT) 160-4.5 MCG/ACT inhaler Inhale 2 puffs into the lungs 2 (two) times daily. Patient not taking: Reported on 10/19/2014 10/14/14   Drue Second, NP  ipratropium-albuterol (DUONEB) 0.5-2.5 (3) MG/3ML SOLN Take 3 mLs by nebulization every 6 (six) hours. Patient not taking: Reported on 10/19/2014 08/09/14   Thurnell Lose, MD  prochlorperazine (COMPAZINE) 10 MG tablet Take 1 tablet (10 mg total) by mouth every 6 (six) hours as needed for nausea or vomiting. Patient not taking: Reported on 10/19/2014 09/22/14   Curt Bears, MD   BP 122/87 mmHg  Pulse 78  Temp(Src) 98.7 F (37.1  C) (Oral)  Resp 20  SpO2 95% Physical Exam  Constitutional: He is oriented to person, place, and time. He appears well-developed and well-nourished. No distress.  HENT:  Head: Normocephalic and atraumatic.  Mouth/Throat: Oropharynx is clear and moist. No oropharyngeal exudate.  Eyes: Conjunctivae and EOM are normal. Pupils are equal, round, and reactive to light.  Chronic L eye injury  Neck: Normal range of motion. Neck  supple.  No meningismus.  Cardiovascular: Normal rate, regular rhythm, normal heart sounds and intact distal pulses.   No murmur heard. Pulmonary/Chest: No respiratory distress. He has wheezes.  Few scattered expiratory wheezing  Abdominal: Soft. There is no tenderness. There is no rebound and no guarding.  Musculoskeletal: Normal range of motion. He exhibits no edema or tenderness.  Neurological: He is alert and oriented to person, place, and time. No cranial nerve deficit. He exhibits normal muscle tone. Coordination normal.  No ataxia on finger to nose bilaterally. No pronator drift. 5/5 strength throughout. CN 2-12 intact. Negative Romberg. Equal grip strength. Sensation intact. Gait is normal.   Skin: Skin is warm.  Psychiatric: He has a normal mood and affect. His behavior is normal.  Nursing note and vitals reviewed.   ED Course  Procedures (including critical care time) Labs Review Labs Reviewed  CBC - Abnormal; Notable for the following:    WBC 3.3 (*)    RBC 2.77 (*)    Hemoglobin 8.3 (*)    HCT 25.7 (*)    RDW 18.0 (*)    Platelets 45 (*)    All other components within normal limits  COMPREHENSIVE METABOLIC PANEL - Abnormal; Notable for the following:    Glucose, Bld 103 (*)    Albumin 3.1 (*)    GFR calc non Af Amer 59 (*)    GFR calc Af Amer 68 (*)    Anion gap 4 (*)    All other components within normal limits  CULTURE, BLOOD (ROUTINE X 2)  CULTURE, BLOOD (ROUTINE X 2)  URINALYSIS, ROUTINE W REFLEX MICROSCOPIC  I-STAT CG4 LACTIC ACID, ED    Imaging Review Dg Chest 2 View  10/19/2014   CLINICAL DATA:  Wheezing for 3 days.  Lung carcinoma  EXAM: CHEST  2 VIEW  COMPARISON:  Chest CT September 10, 2014 and chest radiograph September 25, 2014  FINDINGS: The medial aspect right upper lobe mass with adenopathy is again noted. Lungs elsewhere clear. Heart size and pulmonary vascularity are normal. No adenopathy. No bone lesions.  IMPRESSION: Persistent mass with adenopathy  in the medial aspect of the right upper lobe. Lungs elsewhere clear.   Electronically Signed   By: Lowella Grip III M.D.   On: 10/19/2014 14:39     EKG Interpretation None      MDM   Final diagnoses:  None  3 day history of intermittent wheezing and urinary frequency.  No chest pain, cough, fever.  Patient in no distress. Faint expiratory wheezing bilaterally. Chest x-ray shows persistent right upper lobe mass.  He is afebrile. Labs show worsening anemia and thrombocytopenia. No neutropenia.  Patient ambulatory maintaining saturations at 100%. Denies shortness of breath or chest pain.  D/w Dr. Earlie Server. He states anemia and thrombocytopenia are expected status post chemotherapy. Patient is afebrile in no distress. He is not neutropenic. Dr. Earlie Server feels he is stable for discharge  UA pending as well as CBC differential to ensure no neutropenia at time of sign out to Dr. Zenia Resides.  Anticipate discharge home with treatment  for bronchitis.  Nebulizers refilled.  Ezequiel Essex, MD 10/19/14 [phone]

## 2014-10-19 NOTE — ED Notes (Signed)
Pt. Unable to urinate at this time. Will collect specimen when patient voids. Nurse was notified.

## 2014-10-19 NOTE — ED Notes (Signed)
His family who are with him tell us pt. Has had urinary frequency and some wheezing x 1-2 days; and for a brief interval yesterday evening pt. Became "unlike himself and maybe a little confused".  Currently pt. Is in no distress and has faint exp. Wheezes.  Occasional dry hacky cough noted.

## 2014-10-19 NOTE — Discharge Instructions (Signed)
Acute Bronchitis Use the nebulizers as prescribed. Follow up with Dr. Earlie Server this week. Return to the ED if you develop new or worsening symptoms.  Bronchitis is inflammation of the airways that extend from the windpipe into the lungs (bronchi). The inflammation often causes mucus to develop. This leads to a cough, which is the most common symptom of bronchitis.  In acute bronchitis, the condition usually develops suddenly and goes away over time, usually in a couple weeks. Smoking, allergies, and asthma can make bronchitis worse. Repeated episodes of bronchitis may cause further lung problems.  CAUSES Acute bronchitis is most often caused by the same virus that causes a cold. The virus can spread from person to person (contagious) through coughing, sneezing, and touching contaminated objects. SIGNS AND SYMPTOMS   Cough.   Fever.   Coughing up mucus.   Body aches.   Chest congestion.   Chills.   Shortness of breath.   Sore throat.  DIAGNOSIS  Acute bronchitis is usually diagnosed through a physical exam. Your health care provider will also ask you questions about your medical history. Tests, such as chest X-rays, are sometimes done to rule out other conditions.  TREATMENT  Acute bronchitis usually goes away in a couple weeks. Oftentimes, no medical treatment is necessary. Medicines are sometimes given for relief of fever or cough. Antibiotic medicines are usually not needed but may be prescribed in certain situations. In some cases, an inhaler may be recommended to help reduce shortness of breath and control the cough. A cool mist vaporizer may also be used to help thin bronchial secretions and make it easier to clear the chest.  HOME CARE INSTRUCTIONS  Get plenty of rest.   Drink enough fluids to keep your urine clear or pale yellow (unless you have a medical condition that requires fluid restriction). Increasing fluids may help thin your respiratory secretions (sputum) and  reduce chest congestion, and it will prevent dehydration.   Take medicines only as directed by your health care provider.  If you were prescribed an antibiotic medicine, finish it all even if you start to feel better.  Avoid smoking and secondhand smoke. Exposure to cigarette smoke or irritating chemicals will make bronchitis worse. If you are a smoker, consider using nicotine gum or skin patches to help control withdrawal symptoms. Quitting smoking will help your lungs heal faster.   Reduce the chances of another bout of acute bronchitis by washing your hands frequently, avoiding people with cold symptoms, and trying not to touch your hands to your mouth, nose, or eyes.   Keep all follow-up visits as directed by your health care provider.  SEEK MEDICAL CARE IF: Your symptoms do not improve after 1 week of treatment.  SEEK IMMEDIATE MEDICAL CARE IF:  You develop an increased fever or chills.   You have chest pain.   You have severe shortness of breath.  You have bloody sputum.   You develop dehydration.  You faint or repeatedly feel like you are going to pass out.  You develop repeated vomiting.  You develop a severe headache. MAKE SURE YOU:   Understand these instructions.  Will watch your condition.  Will get help right away if you are not doing well or get worse. Document Released: 09/29/2004 Document Revised: 01/06/2014 Document Reviewed: 02/12/2013 [facility] Patient Information 2015 [place], [place]. This information is not intended to replace advice given to you by your health care provider. Make sure you discuss any questions you have with your health care provider.

## 2014-10-21 ENCOUNTER — Other Ambulatory Visit (HOSPITAL_BASED_OUTPATIENT_CLINIC_OR_DEPARTMENT_OTHER): Payer: Medicare Other

## 2014-10-21 DIAGNOSIS — E8809 Other disorders of plasma-protein metabolism, not elsewhere classified: Secondary | ICD-10-CM | POA: Diagnosis not present

## 2014-10-21 DIAGNOSIS — C3411 Malignant neoplasm of upper lobe, right bronchus or lung: Secondary | ICD-10-CM | POA: Diagnosis not present

## 2014-10-21 LAB — CBC WITH DIFFERENTIAL/PLATELET
BASO%: 0.7 % (ref 0.0–2.0)
Basophils Absolute: 0 10*3/uL (ref 0.0–0.1)
EOS ABS: 0 10*3/uL (ref 0.0–0.5)
EOS%: 2.1 % (ref 0.0–7.0)
HEMATOCRIT: 28.6 % — AB (ref 38.4–49.9)
HGB: 9.2 g/dL — ABNORMAL LOW (ref 13.0–17.1)
LYMPH%: 36.2 % (ref 14.0–49.0)
MCH: 29.5 pg (ref 27.2–33.4)
MCHC: 32 g/dL (ref 32.0–36.0)
MCV: 92 fL (ref 79.3–98.0)
MONO#: 0.1 10*3/uL (ref 0.1–0.9)
MONO%: 9.9 % (ref 0.0–14.0)
NEUT#: 0.7 10*3/uL — ABNORMAL LOW (ref 1.5–6.5)
NEUT%: 51.1 % (ref 39.0–75.0)
PLATELETS: 47 10*3/uL — AB (ref 140–400)
RBC: 3.11 10*6/uL — AB (ref 4.20–5.82)
RDW: 18.6 % — AB (ref 11.0–14.6)
WBC: 1.5 10*3/uL — AB (ref 4.0–10.3)
lymph#: 0.5 10*3/uL — ABNORMAL LOW (ref 0.9–3.3)

## 2014-10-21 LAB — COMPREHENSIVE METABOLIC PANEL (CC13)
ALBUMIN: 3.2 g/dL — AB (ref 3.5–5.0)
ALK PHOS: 77 U/L (ref 40–150)
ALT: 37 U/L (ref 0–55)
AST: 16 U/L (ref 5–34)
Anion Gap: 10 mEq/L (ref 3–11)
BUN: 17.4 mg/dL (ref 7.0–26.0)
CALCIUM: 9.9 mg/dL (ref 8.4–10.4)
CO2: 27 mEq/L (ref 22–29)
Chloride: 100 mEq/L (ref 98–109)
Creatinine: 1.3 mg/dL (ref 0.7–1.3)
EGFR: 65 mL/min/{1.73_m2} — AB (ref >90)
GLUCOSE: 110 mg/dL (ref 70–140)
POTASSIUM: 3.8 meq/L (ref 3.5–5.1)
Sodium: 137 mEq/L (ref 136–145)
Total Bilirubin: 0.73 mg/dL (ref 0.20–1.20)
Total Protein: 8.9 g/dL — ABNORMAL HIGH (ref 6.4–8.3)

## 2014-10-22 ENCOUNTER — Encounter: Payer: Self-pay | Admitting: Internal Medicine

## 2014-10-22 NOTE — Progress Notes (Signed)
Pt is approved for the $400 [facility] grant.  °

## 2014-10-25 LAB — CULTURE, BLOOD (ROUTINE X 2)
CULTURE: NO GROWTH
Culture: NO GROWTH

## 2014-10-28 ENCOUNTER — Other Ambulatory Visit (HOSPITAL_BASED_OUTPATIENT_CLINIC_OR_DEPARTMENT_OTHER): Payer: Medicare Other

## 2014-10-28 DIAGNOSIS — C3411 Malignant neoplasm of upper lobe, right bronchus or lung: Secondary | ICD-10-CM | POA: Diagnosis present

## 2014-10-28 DIAGNOSIS — E8809 Other disorders of plasma-protein metabolism, not elsewhere classified: Secondary | ICD-10-CM

## 2014-10-28 LAB — CBC WITH DIFFERENTIAL/PLATELET
BASO%: 0.2 % (ref 0.0–2.0)
Basophils Absolute: 0 10*3/uL (ref 0.0–0.1)
EOS%: 1.3 % (ref 0.0–7.0)
Eosinophils Absolute: 0.1 10*3/uL (ref 0.0–0.5)
HEMATOCRIT: 28.6 % — AB (ref 38.4–49.9)
HGB: 9.3 g/dL — ABNORMAL LOW (ref 13.0–17.1)
LYMPH%: 21.1 % (ref 14.0–49.0)
MCH: 30.9 pg (ref 27.2–33.4)
MCHC: 32.5 g/dL (ref 32.0–36.0)
MCV: 95 fL (ref 79.3–98.0)
MONO#: 0.7 10*3/uL (ref 0.1–0.9)
MONO%: 14.8 % — ABNORMAL HIGH (ref 0.0–14.0)
NEUT#: 2.8 10*3/uL (ref 1.5–6.5)
NEUT%: 62.6 % (ref 39.0–75.0)
PLATELETS: 187 10*3/uL (ref 140–400)
RBC: 3.01 10*6/uL — AB (ref 4.20–5.82)
RDW: 20 % — ABNORMAL HIGH (ref 11.0–14.6)
WBC: 4.5 10*3/uL (ref 4.0–10.3)
lymph#: 0.9 10*3/uL (ref 0.9–3.3)

## 2014-10-28 LAB — COMPREHENSIVE METABOLIC PANEL (CC13)
ALT: 20 U/L (ref 0–55)
AST: 16 U/L (ref 5–34)
Albumin: 3.1 g/dL — ABNORMAL LOW (ref 3.5–5.0)
Alkaline Phosphatase: 71 U/L (ref 40–150)
Anion Gap: 9 mEq/L (ref 3–11)
BUN: 12.7 mg/dL (ref 7.0–26.0)
CO2: 25 mEq/L (ref 22–29)
Calcium: 9.1 mg/dL (ref 8.4–10.4)
Chloride: 104 mEq/L (ref 98–109)
Creatinine: 1.4 mg/dL — ABNORMAL HIGH (ref 0.7–1.3)
EGFR: 62 mL/min/{1.73_m2} — AB (ref >90)
Glucose: 82 mg/dl (ref 70–140)
Potassium: 3.5 mEq/L (ref 3.5–5.1)
SODIUM: 138 meq/L (ref 136–145)
Total Protein: 8.7 g/dL — ABNORMAL HIGH (ref 6.4–8.3)

## 2014-11-04 ENCOUNTER — Ambulatory Visit: Payer: Medicare Other | Admitting: Nutrition

## 2014-11-04 ENCOUNTER — Other Ambulatory Visit (HOSPITAL_BASED_OUTPATIENT_CLINIC_OR_DEPARTMENT_OTHER): Payer: Medicare HMO

## 2014-11-04 ENCOUNTER — Encounter: Payer: Self-pay | Admitting: Physician Assistant

## 2014-11-04 ENCOUNTER — Ambulatory Visit (HOSPITAL_BASED_OUTPATIENT_CLINIC_OR_DEPARTMENT_OTHER): Payer: Medicare HMO

## 2014-11-04 ENCOUNTER — Telehealth: Payer: Self-pay | Admitting: Internal Medicine

## 2014-11-04 ENCOUNTER — Ambulatory Visit (HOSPITAL_BASED_OUTPATIENT_CLINIC_OR_DEPARTMENT_OTHER): Payer: Medicare HMO | Admitting: Physician Assistant

## 2014-11-04 DIAGNOSIS — E8809 Other disorders of plasma-protein metabolism, not elsewhere classified: Secondary | ICD-10-CM

## 2014-11-04 DIAGNOSIS — Z5111 Encounter for antineoplastic chemotherapy: Secondary | ICD-10-CM

## 2014-11-04 DIAGNOSIS — C3411 Malignant neoplasm of upper lobe, right bronchus or lung: Secondary | ICD-10-CM

## 2014-11-04 DIAGNOSIS — C3491 Malignant neoplasm of unspecified part of right bronchus or lung: Secondary | ICD-10-CM

## 2014-11-04 DIAGNOSIS — R062 Wheezing: Secondary | ICD-10-CM

## 2014-11-04 LAB — COMPREHENSIVE METABOLIC PANEL (CC13)
ALK PHOS: 55 U/L (ref 40–150)
ALT: 17 U/L (ref 0–55)
AST: 16 U/L (ref 5–34)
Albumin: 2.9 g/dL — ABNORMAL LOW (ref 3.5–5.0)
Anion Gap: 9 mEq/L (ref 3–11)
BUN: 16.1 mg/dL (ref 7.0–26.0)
CALCIUM: 9.3 mg/dL (ref 8.4–10.4)
CO2: 24 mEq/L (ref 22–29)
CREATININE: 1.4 mg/dL — AB (ref 0.7–1.3)
Chloride: 106 mEq/L (ref 98–109)
EGFR: 58 mL/min/{1.73_m2} — ABNORMAL LOW (ref >90)
Glucose: 89 mg/dl (ref 70–140)
Potassium: 4.3 mEq/L (ref 3.5–5.1)
Sodium: 138 mEq/L (ref 136–145)
Total Bilirubin: 0.26 mg/dL (ref 0.20–1.20)
Total Protein: 8.1 g/dL (ref 6.4–8.3)

## 2014-11-04 LAB — CBC WITH DIFFERENTIAL/PLATELET
BASO%: 0.3 % (ref 0.0–2.0)
BASOS ABS: 0 10*3/uL (ref 0.0–0.1)
EOS%: 0.1 % (ref 0.0–7.0)
Eosinophils Absolute: 0 10*3/uL (ref 0.0–0.5)
HEMATOCRIT: 27.9 % — AB (ref 38.4–49.9)
HGB: 9.1 g/dL — ABNORMAL LOW (ref 13.0–17.1)
LYMPH#: 0.8 10*3/uL — AB (ref 0.9–3.3)
LYMPH%: 18 % (ref 14.0–49.0)
MCH: 31.3 pg (ref 27.2–33.4)
MCHC: 32.6 g/dL (ref 32.0–36.0)
MCV: 96 fL (ref 79.3–98.0)
MONO#: 0.9 10*3/uL (ref 0.1–0.9)
MONO%: 20 % — AB (ref 0.0–14.0)
NEUT#: 2.7 10*3/uL (ref 1.5–6.5)
NEUT%: 61.6 % (ref 39.0–75.0)
Platelets: 304 10*3/uL (ref 140–400)
RBC: 2.9 10*6/uL — ABNORMAL LOW (ref 4.20–5.82)
RDW: 22.7 % — ABNORMAL HIGH (ref 11.0–14.6)
WBC: 4.4 10*3/uL (ref 4.0–10.3)

## 2014-11-04 MED ORDER — ONDANSETRON 16 MG/50ML IVPB (CHCC)
16.0000 mg | Freq: Once | INTRAVENOUS | Status: AC
  Administered 2014-11-04: 16 mg via INTRAVENOUS

## 2014-11-04 MED ORDER — PACLITAXEL CHEMO INJECTION 300 MG/50ML
175.0000 mg/m2 | Freq: Once | INTRAVENOUS | Status: AC
  Administered 2014-11-04: 342 mg via INTRAVENOUS
  Filled 2014-11-04: qty 57

## 2014-11-04 MED ORDER — DIPHENHYDRAMINE HCL 50 MG/ML IJ SOLN
50.0000 mg | Freq: Once | INTRAMUSCULAR | Status: AC
  Administered 2014-11-04: 50 mg via INTRAVENOUS

## 2014-11-04 MED ORDER — FAMOTIDINE IN NACL 20-0.9 MG/50ML-% IV SOLN
INTRAVENOUS | Status: AC
  Filled 2014-11-04: qty 50

## 2014-11-04 MED ORDER — DEXAMETHASONE SODIUM PHOSPHATE 20 MG/5ML IJ SOLN
20.0000 mg | Freq: Once | INTRAMUSCULAR | Status: AC
  Administered 2014-11-04: 20 mg via INTRAVENOUS

## 2014-11-04 MED ORDER — CARBOPLATIN CHEMO INJECTION 600 MG/60ML
404.5000 mg | Freq: Once | INTRAVENOUS | Status: AC
  Administered 2014-11-04: 400 mg via INTRAVENOUS
  Filled 2014-11-04: qty 40

## 2014-11-04 MED ORDER — DEXAMETHASONE SODIUM PHOSPHATE 20 MG/5ML IJ SOLN
INTRAMUSCULAR | Status: AC
Start: 2014-11-04 — End: 2014-11-04
  Filled 2014-11-04: qty 5

## 2014-11-04 MED ORDER — FAMOTIDINE IN NACL 20-0.9 MG/50ML-% IV SOLN
20.0000 mg | Freq: Once | INTRAVENOUS | Status: AC
  Administered 2014-11-04: 20 mg via INTRAVENOUS

## 2014-11-04 MED ORDER — DIPHENHYDRAMINE HCL 50 MG/ML IJ SOLN
INTRAMUSCULAR | Status: AC
  Filled 2014-11-04: qty 1

## 2014-11-04 MED ORDER — ONDANSETRON 16 MG/50ML IVPB (CHCC)
INTRAVENOUS | Status: AC
  Filled 2014-11-04: qty 16

## 2014-11-04 MED ORDER — SODIUM CHLORIDE 0.9 % IV SOLN
Freq: Once | INTRAVENOUS | Status: AC
  Administered 2014-11-04: 11:00:00 via INTRAVENOUS

## 2014-11-04 NOTE — Progress Notes (Addendum)
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 2) plasma cell dyscrasia diagnosed in 2013.  PRIOR THERAPY: Status post palliative radiotherapy to the right lung obstructing mass under the care of Dr. Pablo Ledger completed on 09/19/2014.  CURRENT THERAPY: Systemic chemotherapy with carboplatin for AUC of 5 and paclitaxel 175 MG/M2 every 3 weeks with Neulasta support. First cycle on 09/23/2014. Status post 2 cycles.  INTERVAL HISTORY: Stephen Ellis 69 y.o. male returns to the clinic today for follow-up visit accompanied by a family member. According to his family member was treated for bronchitis about 2-3 weeks ago. He continues to have some mild residual wheezing and has an inhaler for his use at home.The patient is feeling fine today with no other specific complaints. He has improvement in his breathing and denied having any significant chest pain but continues to have mild cough with no hemoptysis. He is currently at the skilled nursing facility. He denied having any significant fever or chills, no nausea or vomiting. The patient denied having any significant weight loss or night sweats. He is here today to proceed with cycle #3 of his systemic chemotherapy with carboplatin and paclitaxel with Neulasta support.   MEDICAL HISTORY: Past Medical History  Diagnosis Date  . Syncope   . Dehydration   . Blind left eye   . Hypertension   . Anemia     "low Blood"  . Dementia     poor memory  . Cancer     Lung  . Lung cancer 08/12/14    Right lung    ALLERGIES:  has No Known  Allergies.  MEDICATIONS:  Current Outpatient Prescriptions  Medication Sig Dispense Refill  . albuterol (PROVENTIL) (2.5 MG/3ML) 0.083% nebulizer solution Take 3 mLs (2.5 mg total) by nebulization every 6 (six) hours as needed for wheezing or shortness of breath. 75 mL 0  . amLODipine (NORVASC) 10 MG tablet Take 1 tablet (10 mg total) by mouth daily. 30 tablet 2  . budesonide-formoterol (SYMBICORT) 160-4.5 MCG/ACT inhaler Inhale 2 puffs into the lungs 2 (two) times daily. (Patient not taking: Reported on 10/19/2014) 1 Inhaler 12  . doxycycline (VIBRAMYCIN) 100 MG capsule Take 1 capsule (100 mg total) by mouth 2 (two) times daily. 20 capsule 0  . ipratropium-albuterol (DUONEB) 0.5-2.5 (3) MG/3ML SOLN Take 3 mLs by nebulization every 6 (six) hours. (Patient not taking: Reported on 10/19/2014) 360 mL 1  . prochlorperazine (COMPAZINE) 10 MG tablet Take 1 tablet (10 mg total) by mouth every 6 (six) hours as needed for nausea or vomiting. (Patient not taking: Reported on 10/19/2014) 30 tablet 0   No current facility-administered medications for this visit.    SURGICAL HISTORY:  Past Surgical History  Procedure Laterality Date  . No past surgeries    . Video bronchoscopy Bilateral 07/29/2014    Procedure: VIDEO BRONCHOSCOPY WITHOUT FLUORO;  Surgeon: Tanda Rockers, MD;  Location: [facility] ENDOSCOPY;  Service: Cardiopulmonary;  Laterality: Bilateral;  . Video bronchoscopy Bilateral 08/12/2014    Procedure: VIDEO BRONCHOSCOPY WITHOUT FLUORO;  Surgeon: Wilhelmina Mcardle, MD;  Location: [facility]COPY;  Service: Cardiopulmonary;  Laterality: Bilateral;  REVIEW OF SYSTEMS:  Constitutional: positive for fatigue Eyes: negative Ears, nose, mouth, throat, and face: negative Respiratory: positive for cough, dyspnea on exertion and wheezing Cardiovascular: negative Gastrointestinal: negative Genitourinary:negative Integument/breast: negative Hematologic/lymphatic: negative Musculoskeletal:positive for muscle  weakness Neurological: negative Behavioral/Psych: negative Endocrine: negative Allergic/Immunologic: negative   PHYSICAL EXAMINATION: General appearance: alert, cooperative, fatigued and no distress Head: Normocephalic, without obvious abnormality, atraumatic Neck: no adenopathy, no JVD, supple, symmetrical, trachea midline and thyroid not enlarged, symmetric, no tenderness/mass/nodules Lymph nodes: Cervical, supraclavicular, and axillary nodes normal. Resp: wheezes RLL and RML Back: symmetric, no curvature. ROM normal. No CVA tenderness. Cardio: regular rate and rhythm, S1, S2 normal, no murmur, click, rub or gallop GI: soft, non-tender; bowel sounds normal; no masses,  no organomegaly Extremities: extremities normal, atraumatic, no cyanosis or edema Neurologic: Alert and oriented X 3, normal strength and tone. Normal symmetric reflexes. Normal coordination and gait  ECOG PERFORMANCE STATUS: 2 - Symptomatic, <50% confined to bed  There were no vitals taken for this visit.  LABORATORY DATA: Lab Results  Component Value Date   WBC 4.4 11/04/2014   HGB 9.1* 11/04/2014   HCT 27.9* 11/04/2014   MCV 96.0 11/04/2014   PLT 304 11/04/2014      Chemistry      Component Value Date/Time   NA 138 11/04/2014 0949   NA 138 10/19/2014 1306   K 4.3 11/04/2014 0949   K 4.4 10/19/2014 1306   CL 106 10/19/2014 1306   CL 105 07/20/2012 1327   CO2 24 11/04/2014 0949   CO2 28 10/19/2014 1306   BUN 16.1 11/04/2014 0949   BUN 17 10/19/2014 1306   CREATININE 1.4* 11/04/2014 0949   CREATININE 1.24 10/19/2014 1306      Component Value Date/Time   CALCIUM 9.3 11/04/2014 0949   CALCIUM 9.8 10/19/2014 1306   ALKPHOS 55 11/04/2014 0949   ALKPHOS 70 10/19/2014 1306   AST 16 11/04/2014 0949   AST 24 10/19/2014 1306   ALT 17 11/04/2014 0949   ALT 41 10/19/2014 1306   BILITOT 0.26 11/04/2014 0949   BILITOT 0.8 10/19/2014 1306       RADIOGRAPHIC STUDIES: Dg Chest 2 View  10/19/2014    CLINICAL DATA:  Wheezing for 3 days.  Lung carcinoma  EXAM: CHEST  2 VIEW  COMPARISON:  Chest CT September 10, 2014 and chest radiograph September 25, 2014  FINDINGS: The medial aspect right upper lobe mass with adenopathy is again noted. Lungs elsewhere clear. Heart size and pulmonary vascularity are normal. No adenopathy. No bone lesions.  IMPRESSION: Persistent mass with adenopathy in the medial aspect of the right upper lobe. Lungs elsewhere clear.   Electronically Signed   By: Lowella Grip III M.D.   On: 10/19/2014 14:39    ASSESSMENT AND PLAN: This is a very pleasant 69 years old African-American male with: 1) stage IV non-small cell lung cancer with neuroendocrine features: He status post short course of palliative radiotherapy to the right lung obstructing mass under the care of Dr. Pablo Ledger.  He is currently being treated with carboplatin for AUC of 5 and paclitaxel 175 MG/M2 with Neulasta support every 3 weeks.status post 2 cycles.overall is tolerating his chemotherapy relatively well. The patient was discussed with and also seen by Dr. Julien Nordmann. He does have some residual mild wheezing and will receive an albuterol nebulizer treatment while he is receiving his chemotherapy. He is to continue using his home inhaler as prescribed. He will continue with weekly labs as previously  scheduled. He'll follow-up in 3 weeks with a restaging CT scan of the chest, abdomen and pelvis with contrast reevaluate his disease.  The patient was advised to call immediately if he has any concerning symptoms in the interval. The patient voices understanding of current disease status and treatment options and is in agreement with the current care plan.  All questions were answered. The patient knows to call the clinic with any problems, questions or concerns. We can certainly see the patient much sooner if necessary.  Carlton Adam, PA-C 11/04/2014  ADDENDUM: Hematology/Oncology Attending:  I had a face to  face encounter with the patient. I recommended his care plan. This is a very pleasant 69 years old African-American male with metastatic non-small cell lung cancer, squamous cell carcinoma currently undergoing systemic chemotherapy with platinum and paclitaxel is status post 2 cycles and he is tolerating his treatment fairly well. I recommended for the patient to proceed with cycle #3 today as scheduled. He would come back for follow-up visit in 3 weeks after repeating CT scan of the chest, abdomen and pelvis for restaging of his disease. He is currently on observation for the history of plasma cell dyscrasia. The patient was advised to call immediately if he has any concerning symptoms in the interval.  Disclaimer: This note was dictated with voice recognition software. Similar sounding words can inadvertently be transcribed and may not be corrected upon review. Eilleen Kempf., MD 11/08/2014

## 2014-11-04 NOTE — Telephone Encounter (Signed)
added appt pt will get sched in tx. °

## 2014-11-04 NOTE — Progress Notes (Signed)
Nutrition follow-up completed with patient during chemotherapy.  He is being treated for lung cancer.  Patient reports he has been discharged from the nursing home and is now living with his family. He has a very good appetite and thinks his oral intake has improved. Last patient weight documented was 161.8 pounds January 21, down from 168 pounds December 28. Patient has not been using oral nutrition supplements because he feels he is eating better.  Nutrition diagnosis: Unintended weight loss has continued.  Intervention: Patient was educated to continue calories and protein to promote weight maintenance and minimize weight loss. Recommended patient resume Ensure Plus if weight has decreased. Teach back method used.  Monitoring, evaluation, goals: Patient will tolerate adequate calories and protein to minimize weight loss.    Next visit: Tuesday, March 22, during chemotherapy.   **Disclaimer: This note was dictated with voice recognition software. Similar sounding words can inadvertently be transcribed and this note may contain transcription errors which may not have been corrected upon publication of note.**

## 2014-11-04 NOTE — Patient Instructions (Signed)
[facility] Discharge Instructions for Patients Receiving Chemotherapy  Today you received the following chemotherapy agents: Taxol and Carboplatin.  To help prevent nausea and vomiting after your treatment, we encourage you to take your nausea medication: Prochlorperazine. (Compazine). Take one every 6 hours as needed.   If you develop nausea and vomiting that is not controlled by your nausea medication, call the clinic.   BELOW ARE SYMPTOMS THAT SHOULD BE REPORTED IMMEDIATELY:  *FEVER GREATER THAN 100.5 F  *CHILLS WITH OR WITHOUT FEVER  NAUSEA AND VOMITING THAT IS NOT CONTROLLED WITH YOUR NAUSEA MEDICATION  *UNUSUAL SHORTNESS OF BREATH  *UNUSUAL BRUISING OR BLEEDING  TENDERNESS IN MOUTH AND THROAT WITH OR WITHOUT PRESENCE OF ULCERS  *URINARY PROBLEMS  *BOWEL PROBLEMS  UNUSUAL RASH Items with * indicate a potential emergency and should be followed up as soon as possible.  Feel free to call the clinic should you have any questions or concerns. The clinic phone number is [phone].

## 2014-11-05 ENCOUNTER — Ambulatory Visit (HOSPITAL_BASED_OUTPATIENT_CLINIC_OR_DEPARTMENT_OTHER): Payer: Medicare HMO

## 2014-11-05 DIAGNOSIS — C3411 Malignant neoplasm of upper lobe, right bronchus or lung: Secondary | ICD-10-CM | POA: Diagnosis not present

## 2014-11-05 DIAGNOSIS — Z5189 Encounter for other specified aftercare: Secondary | ICD-10-CM

## 2014-11-05 DIAGNOSIS — C3491 Malignant neoplasm of unspecified part of right bronchus or lung: Secondary | ICD-10-CM

## 2014-11-05 MED ORDER — PEGFILGRASTIM INJECTION 6 MG/0.6ML ~~LOC~~
6.0000 mg | PREFILLED_SYRINGE | Freq: Once | SUBCUTANEOUS | Status: AC
  Administered 2014-11-05: 6 mg via SUBCUTANEOUS
  Filled 2014-11-05: qty 0.6

## 2014-11-05 NOTE — Patient Instructions (Signed)
Pegfilgrastim injection What is this medicine? PEGFILGRASTIM (peg fil GRA stim) is a long-acting granulocyte colony-stimulating factor that stimulates the growth of neutrophils, a type of white blood cell important in the body's fight against infection. It is used to reduce the incidence of fever and infection in patients with certain types of cancer who are receiving chemotherapy that affects the bone marrow. This medicine may be used for other purposes; ask your health care provider or pharmacist if you have questions. COMMON BRAND NAME(S): Neulasta What should I tell my health care provider before I take this medicine? They need to know if you have any of these conditions: -latex allergy -ongoing radiation therapy -sickle cell disease -skin reactions to acrylic adhesives (On-Body Injector only) -an unusual or allergic reaction to pegfilgrastim, filgrastim, other medicines, foods, dyes, or preservatives -pregnant or trying to get pregnant -breast-feeding How should I use this medicine? This medicine is for injection under the skin. If you get this medicine at home, you will be taught how to prepare and give the pre-filled syringe or how to use the On-body Injector. Refer to the patient Instructions for Use for detailed instructions. Use exactly as directed. Take your medicine at regular intervals. Do not take your medicine more often than directed. It is important that you put your used needles and syringes in a special sharps container. Do not put them in a trash can. If you do not have a sharps container, call your pharmacist or healthcare provider to get one. Talk to your pediatrician regarding the use of this medicine in children. Special care may be needed. Overdosage: If you think you have taken too much of this medicine contact a poison control center or emergency room at once. NOTE: This medicine is only for you. Do not share this medicine with others. What if I miss a dose? It is  important not to miss your dose. Call your doctor or health care professional if you miss your dose. If you miss a dose due to an On-body Injector failure or leakage, a new dose should be administered as soon as possible using a single prefilled syringe for manual use. What may interact with this medicine? Interactions have not been studied. Give your health care provider a list of all the medicines, herbs, non-prescription drugs, or dietary supplements you use. Also tell them if you smoke, drink alcohol, or use illegal drugs. Some items may interact with your medicine. This list may not describe all possible interactions. Give your health care provider a list of all the medicines, herbs, non-prescription drugs, or dietary supplements you use. Also tell them if you smoke, drink alcohol, or use illegal drugs. Some items may interact with your medicine. What should I watch for while using this medicine? You may need blood work done while you are taking this medicine. If you are going to need a MRI, CT scan, or other procedure, tell your doctor that you are using this medicine (On-Body Injector only). What side effects may I notice from receiving this medicine? Side effects that you should report to your doctor or health care professional as soon as possible: -allergic reactions like skin rash, itching or hives, swelling of the face, lips, or tongue -dizziness -fever -pain, redness, or irritation at site where injected -pinpoint red spots on the skin -shortness of breath or breathing problems -stomach or side pain, or pain at the shoulder -swelling -tiredness -trouble passing urine Side effects that usually do not require medical attention (report to your doctor   or health care professional if they continue or are bothersome): -bone pain -muscle pain This list may not describe all possible side effects. Call your doctor for medical advice about side effects. You may report side effects to FDA at  1-800-FDA-1088. Where should I keep my medicine? Keep out of the reach of children. Store pre-filled syringes in a refrigerator between 2 and 8 degrees C (36 and 46 degrees F). Do not freeze. Keep in carton to protect from light. Throw away this medicine if it is left out of the refrigerator for more than 48 hours. Throw away any unused medicine after the expiration date. NOTE: This sheet is a summary. It may not cover all possible information. If you have questions about this medicine, talk to your doctor, pharmacist, or health care provider.  2015, Elsevier/Gold Standard. (2013-11-21 16:14:05)  

## 2014-11-06 ENCOUNTER — Telehealth: Payer: Self-pay | Admitting: *Deleted

## 2014-11-06 ENCOUNTER — Telehealth: Payer: Self-pay | Admitting: Medical Oncology

## 2014-11-06 NOTE — Telephone Encounter (Signed)
Good to know

## 2014-11-06 NOTE — Telephone Encounter (Signed)
I left message for pt to call me regarding his nausea and vomiting ( per PT) . I called his  caregiver , [name] , to please let me know if pt needs to come in for appt today .Jake Samples will call pt and call me back.

## 2014-11-06 NOTE — Telephone Encounter (Signed)
-----   Message from Patton Salles, RN sent at 11/06/2014 11:09 AM EST ----- Regarding: Extend home PT? Contact: [phone] Elta Guadeloupe, PT called to see if they can extend home PT for 2 weeks for Mr Adduci. They were supposed to D/C today, but feel he needs a few more weeks.  Please advise and I will call him.  Thanks, [employer]

## 2014-11-06 NOTE — Telephone Encounter (Signed)
[name], caregiver for Mr. Arganbright, called back at Abelina Bachelor RN's request.  He said Mr. Douthat has not had any vomiting since last night, he had a good bowel movement this morning and is taking fluids well and soup.  He does not feel Mr. Mccue needs to come into the office at this time.

## 2014-11-06 NOTE — Telephone Encounter (Signed)
Mark, PT called to see if they can extend home PT for 2 weeks. In-basket message sent to Dr Julien Nordmann and his nurse for advise.

## 2014-11-07 ENCOUNTER — Telehealth: Payer: Self-pay | Admitting: *Deleted

## 2014-11-07 NOTE — Patient Instructions (Signed)
Continue weekly labs as scheduled Follow-up in 3 weeks with a restaging CT scan of your chest, abdomen and pelvis to reevaluate your disease, prior to your next scheduled cycle of chemotherapy.

## 2014-11-07 NOTE — Telephone Encounter (Signed)
-----   Message from Ardeen Garland, RN sent at 11/07/2014  1:31 PM EST ----- Regarding: FW: Extend home PT? Contact: [phone] Please call PT ----- Message -----    From: Curt Bears, MD    Sent: 11/06/2014   5:42 PM      To: Patton Salles, RN, Diane Lillia Abed, RN Subject: RE: Extend home PT?                            OK to extend it. ----- Message -----    From: Patton Salles, RN    Sent: 11/06/2014  11:09 AM      To: Curt Bears, MD, Ardeen Garland, RN Subject: Extend home PT?                                Elta Guadeloupe, PT called to see if they can extend home PT for 2 weeks for Mr Buras. They were supposed to D/C today, but feel he needs a few more weeks.  Please advise and I will call him.  Thanks, [employer]

## 2014-11-07 NOTE — Telephone Encounter (Signed)
Call placed to [facility], [place], gave VO to extend pt's PT per MD. Elta Guadeloupe confirmed ordered and will fax over for MD's signature. No further concerns.

## 2014-11-11 ENCOUNTER — Other Ambulatory Visit (HOSPITAL_BASED_OUTPATIENT_CLINIC_OR_DEPARTMENT_OTHER): Payer: Medicare HMO

## 2014-11-11 DIAGNOSIS — C3411 Malignant neoplasm of upper lobe, right bronchus or lung: Secondary | ICD-10-CM

## 2014-11-11 DIAGNOSIS — E8809 Other disorders of plasma-protein metabolism, not elsewhere classified: Secondary | ICD-10-CM | POA: Diagnosis not present

## 2014-11-11 DIAGNOSIS — C3491 Malignant neoplasm of unspecified part of right bronchus or lung: Secondary | ICD-10-CM

## 2014-11-11 LAB — COMPREHENSIVE METABOLIC PANEL (CC13)
ALBUMIN: 3.1 g/dL — AB (ref 3.5–5.0)
ALK PHOS: 89 U/L (ref 40–150)
ALT: 16 U/L (ref 0–55)
AST: 19 U/L (ref 5–34)
Anion Gap: 8 mEq/L (ref 3–11)
BUN: 15.1 mg/dL (ref 7.0–26.0)
CO2: 25 mEq/L (ref 22–29)
CREATININE: 1.3 mg/dL (ref 0.7–1.3)
Calcium: 9.9 mg/dL (ref 8.4–10.4)
Chloride: 106 mEq/L (ref 98–109)
EGFR: 63 mL/min/{1.73_m2} — AB (ref >90)
Glucose: 98 mg/dl (ref 70–140)
Potassium: 4.1 mEq/L (ref 3.5–5.1)
Sodium: 139 mEq/L (ref 136–145)
Total Bilirubin: 0.28 mg/dL (ref 0.20–1.20)
Total Protein: 8.7 g/dL — ABNORMAL HIGH (ref 6.4–8.3)

## 2014-11-11 LAB — CBC WITH DIFFERENTIAL/PLATELET
BASO%: 0.2 % (ref 0.0–2.0)
BASOS ABS: 0 10*3/uL (ref 0.0–0.1)
EOS%: 0.1 % (ref 0.0–7.0)
Eosinophils Absolute: 0 10*3/uL (ref 0.0–0.5)
HCT: 28.9 % — ABNORMAL LOW (ref 38.4–49.9)
HGB: 9.3 g/dL — ABNORMAL LOW (ref 13.0–17.1)
LYMPH%: 10 % — AB (ref 14.0–49.0)
MCH: 31.3 pg (ref 27.2–33.4)
MCHC: 32.2 g/dL (ref 32.0–36.0)
MCV: 97.3 fL (ref 79.3–98.0)
MONO#: 1.4 10*3/uL — ABNORMAL HIGH (ref 0.1–0.9)
MONO%: 12.9 % (ref 0.0–14.0)
NEUT%: 76.8 % — ABNORMAL HIGH (ref 39.0–75.0)
NEUTROS ABS: 8.2 10*3/uL — AB (ref 1.5–6.5)
Platelets: 229 10*3/uL (ref 140–400)
RBC: 2.97 10*6/uL — AB (ref 4.20–5.82)
RDW: 21.2 % — ABNORMAL HIGH (ref 11.0–14.6)
WBC: 10.6 10*3/uL — ABNORMAL HIGH (ref 4.0–10.3)
lymph#: 1.1 10*3/uL (ref 0.9–3.3)

## 2014-11-17 ENCOUNTER — Telehealth: Payer: Self-pay | Admitting: *Deleted

## 2014-11-17 NOTE — Telephone Encounter (Signed)
Received in basket regarding patient needing information about gas card.  I called and left a vm message for patient to call me.

## 2014-11-17 NOTE — Telephone Encounter (Signed)
Patient's nephew called to get all of his appointment times and instructions for contrast for the CT scans on 11/21/14. He said that he was given two gas cards by Dr. Worthy Flank nurse when Mr. Paynter stated treatment.  He would like to have another gas card, if that is possible.  Please advise.

## 2014-11-17 NOTE — Telephone Encounter (Signed)
Stephen Ellis can you help pt with gas cards?

## 2014-11-18 ENCOUNTER — Other Ambulatory Visit (HOSPITAL_BASED_OUTPATIENT_CLINIC_OR_DEPARTMENT_OTHER): Payer: Medicare HMO

## 2014-11-18 ENCOUNTER — Telehealth: Payer: Self-pay | Admitting: Internal Medicine

## 2014-11-18 DIAGNOSIS — C3491 Malignant neoplasm of unspecified part of right bronchus or lung: Secondary | ICD-10-CM

## 2014-11-18 DIAGNOSIS — C3411 Malignant neoplasm of upper lobe, right bronchus or lung: Secondary | ICD-10-CM | POA: Diagnosis not present

## 2014-11-18 LAB — COMPREHENSIVE METABOLIC PANEL (CC13)
ALBUMIN: 3.2 g/dL — AB (ref 3.5–5.0)
ALK PHOS: 65 U/L (ref 40–150)
ALT: 18 U/L (ref 0–55)
AST: 15 U/L (ref 5–34)
Anion Gap: 10 mEq/L (ref 3–11)
BUN: 15.9 mg/dL (ref 7.0–26.0)
CO2: 25 mEq/L (ref 22–29)
Calcium: 9.4 mg/dL (ref 8.4–10.4)
Chloride: 103 mEq/L (ref 98–109)
Creatinine: 1.4 mg/dL — ABNORMAL HIGH (ref 0.7–1.3)
EGFR: 59 mL/min/{1.73_m2} — ABNORMAL LOW (ref >90)
Glucose: 131 mg/dl (ref 70–140)
Potassium: 3.9 mEq/L (ref 3.5–5.1)
SODIUM: 139 meq/L (ref 136–145)
Total Bilirubin: 0.22 mg/dL (ref 0.20–1.20)
Total Protein: 8.8 g/dL — ABNORMAL HIGH (ref 6.4–8.3)

## 2014-11-18 LAB — CBC WITH DIFFERENTIAL/PLATELET
BASO%: 0.2 % (ref 0.0–2.0)
Basophils Absolute: 0 10*3/uL (ref 0.0–0.1)
EOS%: 0 % (ref 0.0–7.0)
Eosinophils Absolute: 0 10*3/uL (ref 0.0–0.5)
HCT: 30.2 % — ABNORMAL LOW (ref 38.4–49.9)
HGB: 9.9 g/dL — ABNORMAL LOW (ref 13.0–17.1)
LYMPH%: 16 % (ref 14.0–49.0)
MCH: 32.2 pg (ref 27.2–33.4)
MCHC: 32.8 g/dL (ref 32.0–36.0)
MCV: 98.4 fL — ABNORMAL HIGH (ref 79.3–98.0)
MONO#: 0.8 10*3/uL (ref 0.1–0.9)
MONO%: 13.3 % (ref 0.0–14.0)
NEUT#: 4.4 10*3/uL (ref 1.5–6.5)
NEUT%: 70.5 % (ref 39.0–75.0)
PLATELETS: 156 10*3/uL (ref 140–400)
RBC: 3.07 10*6/uL — AB (ref 4.20–5.82)
RDW: 21.6 % — ABNORMAL HIGH (ref 11.0–14.6)
WBC: 6.3 10*3/uL (ref 4.0–10.3)
lymph#: 1 10*3/uL (ref 0.9–3.3)

## 2014-11-18 NOTE — Telephone Encounter (Signed)
Gave calendar for March. Sent patient back to have labs for blood transfusion 03/16.

## 2014-11-21 ENCOUNTER — Ambulatory Visit (HOSPITAL_COMMUNITY)
Admission: RE | Admit: 2014-11-21 | Discharge: 2014-11-21 | Disposition: A | Discharge status: Home / Self Care | Payer: Medicare HMO | Source: Ambulatory Visit | Attending: Physician Assistant | Admitting: Physician Assistant

## 2014-11-21 ENCOUNTER — Encounter (HOSPITAL_COMMUNITY): Payer: Self-pay

## 2014-11-21 DIAGNOSIS — C3490 Malignant neoplasm of unspecified part of unspecified bronchus or lung: Secondary | ICD-10-CM | POA: Insufficient documentation

## 2014-11-21 DIAGNOSIS — C3491 Malignant neoplasm of unspecified part of right bronchus or lung: Secondary | ICD-10-CM

## 2014-11-21 MED ORDER — IOHEXOL 300 MG/ML  SOLN
100.0000 mL | Freq: Once | INTRAMUSCULAR | Status: AC | PRN
  Administered 2014-11-21: 100 mL via INTRAVENOUS

## 2014-11-24 ENCOUNTER — Telehealth: Payer: Self-pay | Admitting: Internal Medicine

## 2014-11-24 ENCOUNTER — Encounter: Payer: Self-pay | Admitting: *Deleted

## 2014-11-24 ENCOUNTER — Ambulatory Visit (HOSPITAL_BASED_OUTPATIENT_CLINIC_OR_DEPARTMENT_OTHER): Payer: Medicare HMO | Admitting: Physician Assistant

## 2014-11-24 ENCOUNTER — Encounter: Payer: Self-pay | Admitting: Physician Assistant

## 2014-11-24 ENCOUNTER — Other Ambulatory Visit (HOSPITAL_BASED_OUTPATIENT_CLINIC_OR_DEPARTMENT_OTHER): Payer: Medicare HMO

## 2014-11-24 VITALS — BP 122/83 | HR 101 | Temp 98.3°F | Resp 18 | Ht 72.0 in | Wt 177.3 lb

## 2014-11-24 DIAGNOSIS — C3411 Malignant neoplasm of upper lobe, right bronchus or lung: Secondary | ICD-10-CM | POA: Diagnosis not present

## 2014-11-24 DIAGNOSIS — C3491 Malignant neoplasm of unspecified part of right bronchus or lung: Secondary | ICD-10-CM

## 2014-11-24 LAB — COMPREHENSIVE METABOLIC PANEL (CC13)
ALK PHOS: 58 U/L (ref 40–150)
ALT: 16 U/L (ref 0–55)
ANION GAP: 10 meq/L (ref 3–11)
AST: 15 U/L (ref 5–34)
Albumin: 3.3 g/dL — ABNORMAL LOW (ref 3.5–5.0)
BILIRUBIN TOTAL: 0.28 mg/dL (ref 0.20–1.20)
BUN: 15.2 mg/dL (ref 7.0–26.0)
CO2: 24 mEq/L (ref 22–29)
CREATININE: 1.6 mg/dL — AB (ref 0.7–1.3)
Calcium: 9.4 mg/dL (ref 8.4–10.4)
Chloride: 105 mEq/L (ref 98–109)
EGFR: 51 mL/min/{1.73_m2} — ABNORMAL LOW (ref >90)
Glucose: 118 mg/dl (ref 70–140)
Potassium: 4 mEq/L (ref 3.5–5.1)
SODIUM: 138 meq/L (ref 136–145)
TOTAL PROTEIN: 9.1 g/dL — AB (ref 6.4–8.3)

## 2014-11-24 LAB — CBC WITH DIFFERENTIAL/PLATELET
BASO%: 0 % (ref 0.0–2.0)
Basophils Absolute: 0 10*3/uL (ref 0.0–0.1)
EOS%: 0.3 % (ref 0.0–7.0)
Eosinophils Absolute: 0 10*3/uL (ref 0.0–0.5)
HCT: 33.1 % — ABNORMAL LOW (ref 38.4–49.9)
HGB: 10.9 g/dL — ABNORMAL LOW (ref 13.0–17.1)
LYMPH%: 22 % (ref 14.0–49.0)
MCH: 32.5 pg (ref 27.2–33.4)
MCHC: 32.9 g/dL (ref 32.0–36.0)
MCV: 98.8 fL — AB (ref 79.3–98.0)
MONO#: 0.6 10*3/uL (ref 0.1–0.9)
MONO%: 16.5 % — AB (ref 0.0–14.0)
NEUT%: 61.2 % (ref 39.0–75.0)
NEUTROS ABS: 2.4 10*3/uL (ref 1.5–6.5)
PLATELETS: 122 10*3/uL — AB (ref 140–400)
RBC: 3.35 10*6/uL — AB (ref 4.20–5.82)
RDW: 20.7 % — ABNORMAL HIGH (ref 11.0–14.6)
WBC: 3.9 10*3/uL — ABNORMAL LOW (ref 4.0–10.3)
lymph#: 0.9 10*3/uL (ref 0.9–3.3)

## 2014-11-24 NOTE — Progress Notes (Addendum)
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 2) plasma cell dyscrasia diagnosed in 2013.  PRIOR THERAPY: Status post palliative radiotherapy to the right lung obstructing mass under the care of Dr. Pablo Ledger completed on 09/19/2014.  CURRENT THERAPY: Systemic chemotherapy with carboplatin for AUC of 5 and paclitaxel 175 MG/M2 every 3 weeks with Neulasta support. First cycle on 09/23/2014. Status post 3 cycles.  INTERVAL HISTORY: Stephen Ellis 69 y.o. male returns to the clinic today for follow-up visit accompanied by a family member. According to his family member he is been having some mild wheezing which is well managed with his albuterol nebulizer treatments. He experienced 1 episode of vomiting as well as some increased frequency of bowel movements after the last cycle of chemotherapy. Otherwise he is tolerating his systemic chemotherapy with carboplatin and paclitaxel with Neulasta support relatively well. The patient is feeling fine today with no other specific complaints. He has improvement in his breathing and denied having any significant chest pain but continues to have mild cough with no hemoptysis.  He denied having any significant fever or chills, no nausea or vomiting. The patient denied having any significant weight loss or night sweats. He is here today to proceed with cycle #4 of his systemic chemotherapy with carboplatin and paclitaxel with Neulasta support. He recently had a restaging CT scan of the chest, abdomen and pelvis and presents to discuss the results.  MEDICAL  HISTORY: Past Medical History  Diagnosis Date  . Syncope   . Dehydration   . Blind left eye   . Hypertension   . Anemia     "low Blood"  . Dementia     poor memory  . Cancer     Lung  . Lung cancer 08/12/14    Right lung    ALLERGIES:  has No Known Allergies.  MEDICATIONS:  Current Outpatient Prescriptions  Medication Sig Dispense Refill  . albuterol (PROVENTIL) (2.5 MG/3ML) 0.083% nebulizer solution Take 3 mLs (2.5 mg total) by nebulization every 6 (six) hours as needed for wheezing or shortness of breath. 75 mL 0  . amLODipine (NORVASC) 10 MG tablet Take 1 tablet (10 mg total) by mouth daily. 30 tablet 2  . budesonide-formoterol (SYMBICORT) 160-4.5 MCG/ACT inhaler Inhale 2 puffs into the lungs 2 (two) times daily. 1 Inhaler 12  . ipratropium-albuterol (DUONEB) 0.5-2.5 (3) MG/3ML SOLN Take 3 mLs by nebulization every 6 (six) hours. 360 mL 1  . doxycycline (VIBRAMYCIN) 100 MG capsule Take 1 capsule (100 mg total) by mouth 2 (two) times daily. (Patient not taking: Reported on 11/24/2014) 20 capsule 0  . prochlorperazine (COMPAZINE) 10 MG tablet Take 1 tablet (10 mg total) by mouth every 6 (six) hours as needed for nausea or vomiting. (Patient not taking: Reported on 11/24/2014) 30 tablet 0   No current facility-administered medications for this visit.    SURGICAL HISTORY:  Past Surgical History  Procedure Laterality Date  . No past surgeries    . Video bronchoscopy Bilateral 07/29/2014    Procedure: VIDEO BRONCHOSCOPY WITHOUT FLUORO;  Surgeon: Tanda Rockers, MD;  Location: [facility] ENDOSCOPY;  Service: Cardiopulmonary;  Laterality: Bilateral;  . Video bronchoscopy Bilateral 08/12/2014    Procedure: VIDEO BRONCHOSCOPY WITHOUT FLUORO;  Surgeon: Wilhelmina Mcardle, MD;  Location: [facility] ENDOSCOPY;  Service: Cardiopulmonary;  Laterality: Bilateral;    REVIEW OF SYSTEMS:  Constitutional: positive for fatigue Eyes: negative Ears, nose, mouth, throat, and face: negative Respiratory: positive for  cough, dyspnea on exertion and wheezing Cardiovascular: negative Gastrointestinal: positive for nausea and vomiting Genitourinary:negative Integument/breast: negative Hematologic/lymphatic: negative Musculoskeletal:positive for muscle weakness Neurological: negative Behavioral/Psych: negative Endocrine: negative Allergic/Immunologic: negative   PHYSICAL EXAMINATION: General appearance: alert, cooperative, fatigued and no distress Head: Normocephalic, without obvious abnormality, atraumatic Neck: no adenopathy, no JVD, supple, symmetrical, trachea midline and thyroid not enlarged, symmetric, no tenderness/mass/nodules Lymph nodes: Cervical, supraclavicular, and axillary nodes normal. Resp: wheezes RLL and RML Back: symmetric, no curvature. ROM normal. No CVA tenderness. Cardio: regular rate and rhythm, S1, S2 normal, no murmur, click, rub or gallop GI: soft, non-tender; bowel sounds normal; no masses,  no organomegaly Extremities: extremities normal, atraumatic, no cyanosis or edema Neurologic: Alert and oriented X 3, normal strength and tone. Normal symmetric reflexes. Normal coordination and gait  ECOG PERFORMANCE STATUS: 2 - Symptomatic, <50% confined to bed  Blood pressure 122/83, pulse 101, temperature 98.3 F (36.8 C), temperature source Oral, resp. rate 18, height 6' (1.829 m), weight 177 lb 4.8 oz (80.423 kg), SpO2 99 %.  LABORATORY DATA: Lab Results  Component Value Date   WBC 3.9* 11/24/2014   HGB 10.9* 11/24/2014   HCT 33.1* 11/24/2014   MCV 98.8* 11/24/2014   PLT 122* 11/24/2014      Chemistry      Component Value Date/Time   NA 138 11/24/2014 0948   NA 138 10/19/2014 1306   K 4.0 11/24/2014 0948   K 4.4 10/19/2014 1306   CL 106 10/19/2014 1306   CL 105 07/20/2012 1327   CO2 24 11/24/2014 0948   CO2 28 10/19/2014 1306   BUN 15.2 11/24/2014 0948   BUN 17 10/19/2014 1306   CREATININE 1.6* 11/24/2014 0948   CREATININE 1.24 10/19/2014 1306      Component  Value Date/Time   CALCIUM 9.4 11/24/2014 0948   CALCIUM 9.8 10/19/2014 1306   ALKPHOS 58 11/24/2014 0948   ALKPHOS 70 10/19/2014 1306   AST 15 11/24/2014 0948   AST 24 10/19/2014 1306   ALT 16 11/24/2014 0948   ALT 41 10/19/2014 1306   BILITOT 0.28 11/24/2014 0948   BILITOT 0.8 10/19/2014 1306       RADIOGRAPHIC STUDIES: Ct Chest W Contrast  11/21/2014   CLINICAL DATA:  Restaging right lung non-small cell carcinoma. On going chemotherapy and radiation therapy.  EXAM: CT CHEST, ABDOMEN, AND PELVIS WITH CONTRAST  TECHNIQUE: Multidetector CT imaging of the chest, abdomen and pelvis was performed following the standard protocol during bolus administration of intravenous contrast.  CONTRAST:  177mL OMNIPAQUE IOHEXOL 300 MG/ML  SOLN  COMPARISON:  PET-CT on 08/14/2014  FINDINGS: CT CHEST FINDINGS  Mediastinum/Lymph Nodes: Central right upper lobe mass is again seen which involves the right hilum and right mainstem bronchus, as well as the mediastinum in the right paratracheal region. This mass causes postobstructive collapse of the right upper lobe, also unchanged. This mass measures approximately 4.0 x 4.9 cm on image 19/series 2, which is unchanged compared to recent PET-CT. Mild subcarinal lymphadenopathy is stable measuring 1.4 cm on image 27. Other tiny sub-cm mediastinal lymph nodes are unchanged.  Lungs/Pleura: Postobstructive right upper lobe collapse is again demonstrated due to  the centrally obstructing right upper lobe mass. No other suspicious pulmonary nodules or masses are identified. Previously seen airspace disease in the medial right lower lobe has resolved since prior exam. No evidence of pleural effusion.  Musculoskeletal/Soft Tissues: No suspicious bone lesions or other significant chest wall abnormality.  CT ABDOMEN AND PELVIS FINDINGS  Hepatobiliary: No masses or other significant abnormality identified.  Pancreas: No mass, inflammatory changes, or other significant abnormality  identified.  Spleen:  Within normal limits in size and appearance.  Adrenals:  No masses identified.  Kidneys/Urinary Tract: No evidence of masses or hydronephrosis. Small right renal cysts, and other small sub-cm mediastinal lymph nodes.  Stomach/Bowel/Peritoneum: No evidence of wall thickening, mass, or obstruction.  Vascular/Lymphatic: No pathologically enlarged lymph nodes identified. No other significant abnormality visualized. Retro aortic left renal vein incidentally noted.  Reproductive:  No mass or other significant abnormality identified.  Other:  None.  Musculoskeletal:  No suspicious bone lesions identified.  IMPRESSION: No significant change in central right upper lobe mass with direct involvement of mediastinum, hilum, and right mainstem bronchus. Stable postobstructive right upper lobe collapse.  Stable mild subcarinal mediastinal lymphadenopathy.  Near complete resolution of right lower lobe airspace disease since prior exam, consistent with resolving infectious or inflammatory process.  No radiographic evidence of metastatic disease within the abdomen or pelvis although a tiny sub-cm upper abdominal celiac axis noted showed metabolic activity on recent PET. Continued attention on follow-up imaging is recommended.   Electronically Signed   By: Earle Gell M.D.   On: 11/21/2014 12:04   Ct Abdomen Pelvis W Contrast  11/21/2014   CLINICAL DATA:  Restaging right lung non-small cell carcinoma. On going chemotherapy and radiation therapy.  EXAM: CT CHEST, ABDOMEN, AND PELVIS WITH CONTRAST  TECHNIQUE: Multidetector CT imaging of the chest, abdomen and pelvis was performed following the standard protocol during bolus administration of intravenous contrast.  CONTRAST:  180mL OMNIPAQUE IOHEXOL 300 MG/ML  SOLN  COMPARISON:  PET-CT on 08/14/2014  FINDINGS: CT CHEST FINDINGS  Mediastinum/Lymph Nodes: Central right upper lobe mass is again seen which involves the right hilum and right mainstem bronchus, as well  as the mediastinum in the right paratracheal region. This mass causes postobstructive collapse of the right upper lobe, also unchanged. This mass measures approximately 4.0 x 4.9 cm on image 19/series 2, which is unchanged compared to recent PET-CT. Mild subcarinal lymphadenopathy is stable measuring 1.4 cm on image 27. Other tiny sub-cm mediastinal lymph nodes are unchanged.  Lungs/Pleura: Postobstructive right upper lobe collapse is again demonstrated due to the centrally obstructing right upper lobe mass. No other suspicious pulmonary nodules or masses are identified. Previously seen airspace disease in the medial right lower lobe has resolved since prior exam. No evidence of pleural effusion.  Musculoskeletal/Soft Tissues: No suspicious bone lesions or other significant chest wall abnormality.  CT ABDOMEN AND PELVIS FINDINGS  Hepatobiliary: No masses or other significant abnormality identified.  Pancreas: No mass, inflammatory changes, or other significant abnormality identified.  Spleen:  Within normal limits in size and appearance.  Adrenals:  No masses identified.  Kidneys/Urinary Tract: No evidence of masses or hydronephrosis. Small right renal cysts, and other small sub-cm mediastinal lymph nodes.  Stomach/Bowel/Peritoneum: No evidence of wall thickening, mass, or obstruction.  Vascular/Lymphatic: No pathologically enlarged lymph nodes identified. No other significant abnormality visualized. Retro aortic left renal vein incidentally noted.  Reproductive:  No mass or other significant abnormality identified.  Other:  None.  Musculoskeletal:  No suspicious bone  lesions identified.  IMPRESSION: No significant change in central right upper lobe mass with direct involvement of mediastinum, hilum, and right mainstem bronchus. Stable postobstructive right upper lobe collapse.  Stable mild subcarinal mediastinal lymphadenopathy.  Near complete resolution of right lower lobe airspace disease since prior exam,  consistent with resolving infectious or inflammatory process.  No radiographic evidence of metastatic disease within the abdomen or pelvis although a tiny sub-cm upper abdominal celiac axis noted showed metabolic activity on recent PET. Continued attention on follow-up imaging is recommended.   Electronically Signed   By: Earle Gell M.D.   On: 11/21/2014 12:04    ASSESSMENT AND PLAN: This is a very pleasant 69 years old African-American male with: 1) stage IV non-small cell lung cancer with neuroendocrine features: He status post short course of palliative radiotherapy to the right lung obstructing mass under the care of Dr. Pablo Ledger.  He is currently being treated with carboplatin for AUC of 5 and paclitaxel 175 MG/M2 with Neulasta support every 3 weeks.status post 3 cycles.overall is tolerating his chemotherapy relatively well. The patient was discussed with and also seen by Dr. Julien Nordmann. His recent restaging CT scan was stable overall all with some improvement in the inflammatory process in the right lower lobe. There is a small/tiny subcentimeter upper abdominal celiac axis node that showed metabolic activity on the PET scan and will be closely followed on subsequent imaging studies. He'll proceed with cycle #4 today as scheduled. He will continue with weekly labs as scheduled and return in 3 weeks prior to the start of cycle #5.   The patient was advised to call immediately if he has any concerning symptoms in the interval. The patient voices understanding of current disease status and treatment options and is in agreement with the current care plan.  All questions were answered. The patient knows to call the clinic with any problems, questions or concerns. We can certainly see the patient much sooner if necessary.  Carlton Adam, PA-C 11/24/2014   ADDENDUM: Hematology/Oncology Attending: I had a face to face encounter with the patient. I recommended his care plan. This is a very  pleasant 69 years old African-American male with a stage IV non-small cell lung cancer with neuroendocrine differentiation is currently undergoing systemic chemotherapy with carboplatin and paclitaxel is status post 3 cycles. The patient is tolerating his treatment fairly well with no significant adverse effects except for mild fatigue. His recent CT scan of the chest, abdomen and pelvis showed no evidence for disease progression. I discussed the scan results with the patient and his nephew. I recommended for him to continue his current treatment with systemic chemotherapy with carboplatin and paclitaxel. He will proceed with cycle #4 today as a scheduled. The patient would come back for follow-up visit in 3 weeks before starting cycle #5. He was advised to call immediately if he has any concerning symptoms in the interval.  Disclaimer: This note was dictated with voice recognition software. Similar sounding words can inadvertently be transcribed and may be missed upon review. Eilleen Kempf., MD 11/24/2014

## 2014-11-24 NOTE — Patient Instructions (Signed)
Your recent restaging CT scan showed stable disease Continue with labs and chemotherapy as scheduled Follow-up in 3 weeks

## 2014-11-24 NOTE — [facility] Oncology Navigator Note (Unsigned)
Patient requested gas card assistance again.  I help patient complete form and fax.

## 2014-11-24 NOTE — Telephone Encounter (Signed)
gv and printed appt sched and avs for pt for March and April...sed added tx. °

## 2014-11-25 ENCOUNTER — Ambulatory Visit: Payer: Medicare Other | Admitting: Physician Assistant

## 2014-11-25 ENCOUNTER — Other Ambulatory Visit: Payer: Medicare Other

## 2014-11-25 ENCOUNTER — Ambulatory Visit (HOSPITAL_BASED_OUTPATIENT_CLINIC_OR_DEPARTMENT_OTHER): Payer: Medicare HMO

## 2014-11-25 ENCOUNTER — Ambulatory Visit: Payer: Medicare Other | Admitting: Nutrition

## 2014-11-25 DIAGNOSIS — Z5111 Encounter for antineoplastic chemotherapy: Secondary | ICD-10-CM

## 2014-11-25 DIAGNOSIS — C3411 Malignant neoplasm of upper lobe, right bronchus or lung: Secondary | ICD-10-CM | POA: Diagnosis not present

## 2014-11-25 DIAGNOSIS — C3491 Malignant neoplasm of unspecified part of right bronchus or lung: Secondary | ICD-10-CM

## 2014-11-25 MED ORDER — FAMOTIDINE IN NACL 20-0.9 MG/50ML-% IV SOLN
20.0000 mg | Freq: Once | INTRAVENOUS | Status: AC
  Administered 2014-11-25: 20 mg via INTRAVENOUS

## 2014-11-25 MED ORDER — SODIUM CHLORIDE 0.9 % IV SOLN
369.5000 mg | Freq: Once | INTRAVENOUS | Status: AC
  Administered 2014-11-25: 370 mg via INTRAVENOUS
  Filled 2014-11-25: qty 37

## 2014-11-25 MED ORDER — DIPHENHYDRAMINE HCL 50 MG/ML IJ SOLN
INTRAMUSCULAR | Status: AC
  Filled 2014-11-25: qty 1

## 2014-11-25 MED ORDER — SODIUM CHLORIDE 0.9 % IV SOLN
Freq: Once | INTRAVENOUS | Status: AC
  Administered 2014-11-25: 11:00:00 via INTRAVENOUS

## 2014-11-25 MED ORDER — DEXTROSE 5 % IV SOLN
175.0000 mg/m2 | Freq: Once | INTRAVENOUS | Status: AC
  Administered 2014-11-25: 342 mg via INTRAVENOUS
  Filled 2014-11-25: qty 57

## 2014-11-25 MED ORDER — DIPHENHYDRAMINE HCL 50 MG/ML IJ SOLN
50.0000 mg | Freq: Once | INTRAMUSCULAR | Status: AC
  Administered 2014-11-25: 50 mg via INTRAVENOUS

## 2014-11-25 MED ORDER — FAMOTIDINE IN NACL 20-0.9 MG/50ML-% IV SOLN
INTRAVENOUS | Status: AC
  Filled 2014-11-25: qty 50

## 2014-11-25 MED ORDER — SODIUM CHLORIDE 0.9 % IV SOLN
Freq: Once | INTRAVENOUS | Status: AC
  Administered 2014-11-25: 11:00:00 via INTRAVENOUS
  Filled 2014-11-25: qty 8

## 2014-11-25 NOTE — Patient Instructions (Signed)
[facility] Discharge Instructions for Patients Receiving Chemotherapy  Today you received the following chemotherapy agents Taxol and Paraplatin.  To help prevent nausea and vomiting after your treatment, we encourage you to take your nausea medication as prescribed by physician.   If you develop nausea and vomiting that is not controlled by your nausea medication, call the clinic.   BELOW ARE SYMPTOMS THAT SHOULD BE REPORTED IMMEDIATELY:  *FEVER GREATER THAN 100.5 F  *CHILLS WITH OR WITHOUT FEVER  NAUSEA AND VOMITING THAT IS NOT CONTROLLED WITH YOUR NAUSEA MEDICATION  *UNUSUAL SHORTNESS OF BREATH  *UNUSUAL BRUISING OR BLEEDING  TENDERNESS IN MOUTH AND THROAT WITH OR WITHOUT PRESENCE OF ULCERS  *URINARY PROBLEMS  *BOWEL PROBLEMS  UNUSUAL RASH Items with * indicate a potential emergency and should be followed up as soon as possible.  Feel free to call the clinic you have any questions or concerns. The clinic phone number is [phone].  Please show the [facility] at check-in to the Emergency Department and triage nurse.

## 2014-11-25 NOTE — Progress Notes (Signed)
Nutrition follow up completed with patient in chemotherapy for lung cancer. Patient is eating well and has no nutrition complaints.   He did have nausea and diarrhea after his treatment but that resolved quickly. Weight increased and documented as 177.3 pounds, increased from 161.8 pounds on January 21. He has not needed to drink oral nutrition supplements since his appetite and intake have improved.  Nutrition diagnosis:  Unintended weight loss resolved.  Patient encouraged to continue adequate calories and protein to promote weight maintenance/stabilization. Patient will contact me with questions or concerns. No follow up scheduled.

## 2014-11-25 NOTE — Progress Notes (Signed)
OK to treat with creat-1.6 from 11/24/14 per Dr. Julien Nordmann.

## 2014-11-26 ENCOUNTER — Ambulatory Visit: Payer: Medicare Other

## 2014-11-27 ENCOUNTER — Ambulatory Visit (HOSPITAL_BASED_OUTPATIENT_CLINIC_OR_DEPARTMENT_OTHER): Payer: Medicare HMO

## 2014-11-27 DIAGNOSIS — C3411 Malignant neoplasm of upper lobe, right bronchus or lung: Secondary | ICD-10-CM | POA: Diagnosis not present

## 2014-11-27 DIAGNOSIS — C3491 Malignant neoplasm of unspecified part of right bronchus or lung: Secondary | ICD-10-CM

## 2014-11-27 DIAGNOSIS — Z5189 Encounter for other specified aftercare: Secondary | ICD-10-CM

## 2014-11-27 MED ORDER — PEGFILGRASTIM INJECTION 6 MG/0.6ML ~~LOC~~
6.0000 mg | PREFILLED_SYRINGE | Freq: Once | SUBCUTANEOUS | Status: AC
  Administered 2014-11-27: 6 mg via SUBCUTANEOUS
  Filled 2014-11-27: qty 0.6

## 2014-11-27 NOTE — Patient Instructions (Signed)
Pegfilgrastim injection What is this medicine? PEGFILGRASTIM (peg fil GRA stim) is a long-acting granulocyte colony-stimulating factor that stimulates the growth of neutrophils, a type of white blood cell important in the body's fight against infection. It is used to reduce the incidence of fever and infection in patients with certain types of cancer who are receiving chemotherapy that affects the bone marrow. This medicine may be used for other purposes; ask your health care provider or pharmacist if you have questions. COMMON BRAND NAME(S): Neulasta What should I tell my health care provider before I take this medicine? They need to know if you have any of these conditions: -latex allergy -ongoing radiation therapy -sickle cell disease -skin reactions to acrylic adhesives (On-Body Injector only) -an unusual or allergic reaction to pegfilgrastim, filgrastim, other medicines, foods, dyes, or preservatives -pregnant or trying to get pregnant -breast-feeding How should I use this medicine? This medicine is for injection under the skin. If you get this medicine at home, you will be taught how to prepare and give the pre-filled syringe or how to use the On-body Injector. Refer to the patient Instructions for Use for detailed instructions. Use exactly as directed. Take your medicine at regular intervals. Do not take your medicine more often than directed. It is important that you put your used needles and syringes in a special sharps container. Do not put them in a trash can. If you do not have a sharps container, call your pharmacist or healthcare provider to get one. Talk to your pediatrician regarding the use of this medicine in children. Special care may be needed. Overdosage: If you think you have taken too much of this medicine contact a poison control center or emergency room at once. NOTE: This medicine is only for you. Do not share this medicine with others. What if I miss a dose? It is  important not to miss your dose. Call your doctor or health care professional if you miss your dose. If you miss a dose due to an On-body Injector failure or leakage, a new dose should be administered as soon as possible using a single prefilled syringe for manual use. What may interact with this medicine? Interactions have not been studied. Give your health care provider a list of all the medicines, herbs, non-prescription drugs, or dietary supplements you use. Also tell them if you smoke, drink alcohol, or use illegal drugs. Some items may interact with your medicine. This list may not describe all possible interactions. Give your health care provider a list of all the medicines, herbs, non-prescription drugs, or dietary supplements you use. Also tell them if you smoke, drink alcohol, or use illegal drugs. Some items may interact with your medicine. What should I watch for while using this medicine? You may need blood work done while you are taking this medicine. If you are going to need a MRI, CT scan, or other procedure, tell your doctor that you are using this medicine (On-Body Injector only). What side effects may I notice from receiving this medicine? Side effects that you should report to your doctor or health care professional as soon as possible: -allergic reactions like skin rash, itching or hives, swelling of the face, lips, or tongue -dizziness -fever -pain, redness, or irritation at site where injected -pinpoint red spots on the skin -shortness of breath or breathing problems -stomach or side pain, or pain at the shoulder -swelling -tiredness -trouble passing urine Side effects that usually do not require medical attention (report to your doctor   or health care professional if they continue or are bothersome): -bone pain -muscle pain This list may not describe all possible side effects. Call your doctor for medical advice about side effects. You may report side effects to FDA at  1-800-FDA-1088. Where should I keep my medicine? Keep out of the reach of children. Store pre-filled syringes in a refrigerator between 2 and 8 degrees C (36 and 46 degrees F). Do not freeze. Keep in carton to protect from light. Throw away this medicine if it is left out of the refrigerator for more than 48 hours. Throw away any unused medicine after the expiration date. NOTE: This sheet is a summary. It may not cover all possible information. If you have questions about this medicine, talk to your doctor, pharmacist, or health care provider.  2015, Elsevier/Gold Standard. (2013-11-21 16:14:05)  

## 2014-12-01 ENCOUNTER — Telehealth: Payer: Self-pay | Admitting: Internal Medicine

## 2014-12-01 NOTE — Telephone Encounter (Signed)
Multivitamins called to [place], 1 po daily #100.  PC to [name], nephew to inform him that the vitamins were called in.

## 2014-12-01 NOTE — Telephone Encounter (Signed)
Patients nephew called requesting Vitamins called in, due to [place] they will be paid for it prescribed by the provider.  Please advise.

## 2014-12-01 NOTE — Telephone Encounter (Signed)
PCP should refill it but if he does not have PCP it is okay to refill it under my name. Thank you

## 2014-12-02 ENCOUNTER — Other Ambulatory Visit (HOSPITAL_BASED_OUTPATIENT_CLINIC_OR_DEPARTMENT_OTHER): Payer: Medicare HMO

## 2014-12-02 DIAGNOSIS — C3411 Malignant neoplasm of upper lobe, right bronchus or lung: Secondary | ICD-10-CM

## 2014-12-02 DIAGNOSIS — C3491 Malignant neoplasm of unspecified part of right bronchus or lung: Secondary | ICD-10-CM

## 2014-12-02 LAB — CBC WITH DIFFERENTIAL/PLATELET
BASO%: 0.5 % (ref 0.0–2.0)
BASOS ABS: 0 10*3/uL (ref 0.0–0.1)
EOS%: 0.5 % (ref 0.0–7.0)
Eosinophils Absolute: 0 10*3/uL (ref 0.0–0.5)
HCT: 30.3 % — ABNORMAL LOW (ref 38.4–49.9)
HGB: 9.8 g/dL — ABNORMAL LOW (ref 13.0–17.1)
LYMPH#: 0.6 10*3/uL — AB (ref 0.9–3.3)
LYMPH%: 11.4 % — AB (ref 14.0–49.0)
MCH: 32.5 pg (ref 27.2–33.4)
MCHC: 32.5 g/dL (ref 32.0–36.0)
MCV: 99.9 fL — ABNORMAL HIGH (ref 79.3–98.0)
MONO#: 0.6 10*3/uL (ref 0.1–0.9)
MONO%: 11.6 % (ref 0.0–14.0)
NEUT#: 4.2 10*3/uL (ref 1.5–6.5)
NEUT%: 76 % — AB (ref 39.0–75.0)
PLATELETS: 85 10*3/uL — AB (ref 140–400)
RBC: 3.03 10*6/uL — ABNORMAL LOW (ref 4.20–5.82)
RDW: 20 % — AB (ref 11.0–14.6)
WBC: 5.5 10*3/uL (ref 4.0–10.3)

## 2014-12-02 LAB — COMPREHENSIVE METABOLIC PANEL (CC13)
ALK PHOS: 74 U/L (ref 40–150)
ALT: 17 U/L (ref 0–55)
AST: 15 U/L (ref 5–34)
Albumin: 3.4 g/dL — ABNORMAL LOW (ref 3.5–5.0)
Anion Gap: 10 mEq/L (ref 3–11)
BUN: 23.7 mg/dL (ref 7.0–26.0)
CALCIUM: 9.3 mg/dL (ref 8.4–10.4)
CO2: 24 mEq/L (ref 22–29)
Chloride: 103 mEq/L (ref 98–109)
Creatinine: 1.6 mg/dL — ABNORMAL HIGH (ref 0.7–1.3)
EGFR: 50 mL/min/{1.73_m2} — ABNORMAL LOW (ref >90)
GLUCOSE: 100 mg/dL (ref 70–140)
Potassium: 4.4 mEq/L (ref 3.5–5.1)
SODIUM: 137 meq/L (ref 136–145)
Total Bilirubin: 0.41 mg/dL (ref 0.20–1.20)
Total Protein: 9.2 g/dL — ABNORMAL HIGH (ref 6.4–8.3)

## 2014-12-09 ENCOUNTER — Other Ambulatory Visit (HOSPITAL_BASED_OUTPATIENT_CLINIC_OR_DEPARTMENT_OTHER): Payer: Medicare HMO

## 2014-12-09 DIAGNOSIS — C3491 Malignant neoplasm of unspecified part of right bronchus or lung: Secondary | ICD-10-CM

## 2014-12-09 DIAGNOSIS — C3411 Malignant neoplasm of upper lobe, right bronchus or lung: Secondary | ICD-10-CM

## 2014-12-09 LAB — COMPREHENSIVE METABOLIC PANEL (CC13)
ALBUMIN: 3.4 g/dL — AB (ref 3.5–5.0)
ALK PHOS: 72 U/L (ref 40–150)
ALT: 15 U/L (ref 0–55)
AST: 16 U/L (ref 5–34)
Anion Gap: 10 mEq/L (ref 3–11)
BUN: 18.1 mg/dL (ref 7.0–26.0)
CO2: 22 mEq/L (ref 22–29)
Calcium: 9.6 mg/dL (ref 8.4–10.4)
Chloride: 106 mEq/L (ref 98–109)
Creatinine: 1.6 mg/dL — ABNORMAL HIGH (ref 0.7–1.3)
EGFR: 51 mL/min/{1.73_m2} — ABNORMAL LOW (ref >90)
Glucose: 87 mg/dl (ref 70–140)
POTASSIUM: 4.1 meq/L (ref 3.5–5.1)
Sodium: 138 mEq/L (ref 136–145)
Total Bilirubin: 0.47 mg/dL (ref 0.20–1.20)
Total Protein: 9.1 g/dL — ABNORMAL HIGH (ref 6.4–8.3)

## 2014-12-09 LAB — CBC WITH DIFFERENTIAL/PLATELET
BASO%: 0 % (ref 0.0–2.0)
BASOS ABS: 0 10*3/uL (ref 0.0–0.1)
EOS%: 0.3 % (ref 0.0–7.0)
Eosinophils Absolute: 0 10*3/uL (ref 0.0–0.5)
HEMATOCRIT: 31.4 % — AB (ref 38.4–49.9)
HGB: 10.4 g/dL — ABNORMAL LOW (ref 13.0–17.1)
LYMPH%: 23.6 % (ref 14.0–49.0)
MCH: 33.3 pg (ref 27.2–33.4)
MCHC: 33.1 g/dL (ref 32.0–36.0)
MCV: 100.6 fL — AB (ref 79.3–98.0)
MONO#: 0.3 10*3/uL (ref 0.1–0.9)
MONO%: 10.4 % (ref 0.0–14.0)
NEUT#: 2 10*3/uL (ref 1.5–6.5)
NEUT%: 65.7 % (ref 39.0–75.0)
Platelets: 83 10*3/uL — ABNORMAL LOW (ref 140–400)
RBC: 3.12 10*6/uL — ABNORMAL LOW (ref 4.20–5.82)
RDW: 18.3 % — ABNORMAL HIGH (ref 11.0–14.6)
WBC: 3 10*3/uL — ABNORMAL LOW (ref 4.0–10.3)
lymph#: 0.7 10*3/uL — ABNORMAL LOW (ref 0.9–3.3)

## 2014-12-16 ENCOUNTER — Ambulatory Visit (HOSPITAL_BASED_OUTPATIENT_CLINIC_OR_DEPARTMENT_OTHER): Payer: Medicare HMO | Admitting: Physician Assistant

## 2014-12-16 ENCOUNTER — Other Ambulatory Visit (HOSPITAL_BASED_OUTPATIENT_CLINIC_OR_DEPARTMENT_OTHER): Payer: Medicare HMO

## 2014-12-16 ENCOUNTER — Encounter: Payer: Self-pay | Admitting: Physician Assistant

## 2014-12-16 ENCOUNTER — Ambulatory Visit (HOSPITAL_BASED_OUTPATIENT_CLINIC_OR_DEPARTMENT_OTHER): Payer: Medicare HMO

## 2014-12-16 VITALS — BP 111/82 | HR 94 | Temp 98.4°F | Resp 20 | Ht 72.0 in | Wt 176.2 lb

## 2014-12-16 DIAGNOSIS — C3411 Malignant neoplasm of upper lobe, right bronchus or lung: Secondary | ICD-10-CM

## 2014-12-16 DIAGNOSIS — C3491 Malignant neoplasm of unspecified part of right bronchus or lung: Secondary | ICD-10-CM

## 2014-12-16 DIAGNOSIS — Z5111 Encounter for antineoplastic chemotherapy: Secondary | ICD-10-CM | POA: Diagnosis not present

## 2014-12-16 LAB — CBC WITH DIFFERENTIAL/PLATELET
BASO%: 0.3 % (ref 0.0–2.0)
Basophils Absolute: 0 10*3/uL (ref 0.0–0.1)
EOS ABS: 0 10*3/uL (ref 0.0–0.5)
EOS%: 0 % (ref 0.0–7.0)
HEMATOCRIT: 31.3 % — AB (ref 38.4–49.9)
HGB: 10.5 g/dL — ABNORMAL LOW (ref 13.0–17.1)
LYMPH%: 31.6 % (ref 14.0–49.0)
MCH: 34.2 pg — ABNORMAL HIGH (ref 27.2–33.4)
MCHC: 33.5 g/dL (ref 32.0–36.0)
MCV: 102 fL — AB (ref 79.3–98.0)
MONO#: 0.4 10*3/uL (ref 0.1–0.9)
MONO%: 14.4 % — AB (ref 0.0–14.0)
NEUT%: 53.7 % (ref 39.0–75.0)
NEUTROS ABS: 1.6 10*3/uL (ref 1.5–6.5)
PLATELETS: 182 10*3/uL (ref 140–400)
RBC: 3.07 10*6/uL — ABNORMAL LOW (ref 4.20–5.82)
RDW: 17.1 % — ABNORMAL HIGH (ref 11.0–14.6)
WBC: 2.9 10*3/uL — AB (ref 4.0–10.3)
lymph#: 0.9 10*3/uL (ref 0.9–3.3)

## 2014-12-16 LAB — COMPREHENSIVE METABOLIC PANEL (CC13)
ALT: 18 U/L (ref 0–55)
AST: 17 U/L (ref 5–34)
Albumin: 3.3 g/dL — ABNORMAL LOW (ref 3.5–5.0)
Alkaline Phosphatase: 62 U/L (ref 40–150)
Anion Gap: 8 mEq/L (ref 3–11)
BUN: 18.1 mg/dL (ref 7.0–26.0)
CO2: 24 mEq/L (ref 22–29)
CREATININE: 1.5 mg/dL — AB (ref 0.7–1.3)
Calcium: 9.3 mg/dL (ref 8.4–10.4)
Chloride: 106 mEq/L (ref 98–109)
EGFR: 54 mL/min/{1.73_m2} — ABNORMAL LOW (ref >90)
Glucose: 83 mg/dl (ref 70–140)
Potassium: 3.8 mEq/L (ref 3.5–5.1)
Sodium: 138 mEq/L (ref 136–145)
Total Bilirubin: 0.39 mg/dL (ref 0.20–1.20)
Total Protein: 8.8 g/dL — ABNORMAL HIGH (ref 6.4–8.3)

## 2014-12-16 MED ORDER — FAMOTIDINE IN NACL 20-0.9 MG/50ML-% IV SOLN
20.0000 mg | Freq: Once | INTRAVENOUS | Status: AC
  Administered 2014-12-16: 20 mg via INTRAVENOUS

## 2014-12-16 MED ORDER — PACLITAXEL CHEMO INJECTION 300 MG/50ML
175.0000 mg/m2 | Freq: Once | INTRAVENOUS | Status: AC
  Administered 2014-12-16: 342 mg via INTRAVENOUS
  Filled 2014-12-16: qty 57

## 2014-12-16 MED ORDER — SODIUM CHLORIDE 0.9 % IV SOLN
369.5000 mg | Freq: Once | INTRAVENOUS | Status: AC
  Administered 2014-12-16: 370 mg via INTRAVENOUS
  Filled 2014-12-16: qty 37

## 2014-12-16 MED ORDER — SODIUM CHLORIDE 0.9 % IV SOLN
Freq: Once | INTRAVENOUS | Status: AC
  Administered 2014-12-16: 12:00:00 via INTRAVENOUS
  Filled 2014-12-16: qty 8

## 2014-12-16 MED ORDER — SODIUM CHLORIDE 0.9 % IV SOLN
Freq: Once | INTRAVENOUS | Status: AC
  Administered 2014-12-16: 12:00:00 via INTRAVENOUS

## 2014-12-16 MED ORDER — DIPHENHYDRAMINE HCL 50 MG/ML IJ SOLN
50.0000 mg | Freq: Once | INTRAMUSCULAR | Status: AC
  Administered 2014-12-16: 50 mg via INTRAVENOUS

## 2014-12-16 NOTE — Patient Instructions (Signed)
Continue weekly labs as scheduled Follow-up in 3 weeks prior to your next scheduled cycle of chemotherapy

## 2014-12-16 NOTE — Progress Notes (Addendum)
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 2) plasma cell dyscrasia diagnosed in 2013.  PRIOR THERAPY: Status post palliative radiotherapy to the right lung obstructing mass under the care of Dr. Pablo Ledger completed on 09/19/2014.  CURRENT THERAPY: Systemic chemotherapy with carboplatin for AUC of 5 and paclitaxel 175 MG/M2 every 3 weeks with Neulasta support. First cycle on 09/23/2014. Status post 4 cycles.  INTERVAL HISTORY: Stephen Ellis 69 y.o. male returns to the clinic today for follow-up visit accompanied by a family member. According to his family member he is been doing well. He is eating well and denied any pain. He is tolerating his systemic chemotherapy with carboplatin and paclitaxel with Neulasta support relatively well. The patient is feeling fine today with no other specific complaints. He has improvement in his breathing and denied having any significant chest pain but continues to have mild cough with no hemoptysis.  He denied having any significant fever or chills, no nausea or vomiting. The patient denied having any significant weight loss or night sweats. He is here today to proceed with cycle #5 of his systemic chemotherapy with carboplatin and paclitaxel with Neulasta support.   MEDICAL HISTORY: Past Medical History  Diagnosis Date  . Syncope   . Dehydration   . Blind left eye   . Hypertension   . Anemia     "low Blood"  . Dementia     poor memory  . Cancer     Lung  . Lung cancer 08/12/14    Right lung    ALLERGIES:  has No Known  Allergies.  MEDICATIONS:  Current Outpatient Prescriptions  Medication Sig Dispense Refill  . albuterol (PROVENTIL) (2.5 MG/3ML) 0.083% nebulizer solution Take 3 mLs (2.5 mg total) by nebulization every 6 (six) hours as needed for wheezing or shortness of breath. 75 mL 0  . amLODipine (NORVASC) 10 MG tablet Take 1 tablet (10 mg total) by mouth daily. 30 tablet 2  . budesonide-formoterol (SYMBICORT) 160-4.5 MCG/ACT inhaler Inhale 2 puffs into the lungs 2 (two) times daily. 1 Inhaler 12  . ipratropium-albuterol (DUONEB) 0.5-2.5 (3) MG/3ML SOLN Take 3 mLs by nebulization every 6 (six) hours. 360 mL 1  . prochlorperazine (COMPAZINE) 10 MG tablet Take 1 tablet (10 mg total) by mouth every 6 (six) hours as needed for nausea or vomiting. (Patient not taking: Reported on 11/24/2014) 30 tablet 0   No current facility-administered medications for this visit.   Facility-Administered Medications Ordered in Other Visits  Medication Dose Route Frequency Provider Last Rate Last Dose  . CARBOplatin (PARAPLATIN) 370 mg in sodium chloride 0.9 % 100 mL chemo infusion  370 mg Intravenous Once Curt Bears, MD      . PACLitaxel (TAXOL) 342 mg in dextrose 5 % 500 mL chemo infusion (> 80mg /m2)  175 mg/m2 (Treatment Plan Actual) Intravenous Once Curt Bears, MD 186 mL/hr at 12/16/14 1232 342 mg at 12/16/14 1232    SURGICAL HISTORY:  Past Surgical History  Procedure Laterality Date  . No past surgeries    . Video bronchoscopy Bilateral 07/29/2014    Procedure: VIDEO BRONCHOSCOPY WITHOUT FLUORO;  Surgeon:  Tanda Rockers, MD;  Location: [facility];  Service: Cardiopulmonary;  Laterality: Bilateral;  . Video bronchoscopy Bilateral 08/12/2014    Procedure: VIDEO BRONCHOSCOPY WITHOUT FLUORO;  Surgeon: Wilhelmina Mcardle, MD;  Location: [facility] ENDOSCOPY;  Service: Cardiopulmonary;  Laterality: Bilateral;    REVIEW OF SYSTEMS:  Constitutional: positive for fatigue Eyes: negative Ears, nose, mouth, throat, and face:  negative Respiratory: positive for cough and dyspnea on exertion Cardiovascular: negative Gastrointestinal: positive for nausea and vomiting Genitourinary:negative Integument/breast: negative Hematologic/lymphatic: negative Musculoskeletal:positive for muscle weakness Neurological: negative Behavioral/Psych: negative Endocrine: negative Allergic/Immunologic: negative   PHYSICAL EXAMINATION: General appearance: alert, cooperative, fatigued, no distress and using a cane to aid with ambulation Head: Normocephalic, without obvious abnormality, atraumatic Neck: no adenopathy, no JVD, supple, symmetrical, trachea midline and thyroid not enlarged, symmetric, no tenderness/mass/nodules Lymph nodes: Cervical, supraclavicular, and axillary nodes normal. Resp: clear to auscultation bilaterally Back: symmetric, no curvature. ROM normal. No CVA tenderness. Cardio: regular rate and rhythm, S1, S2 normal, no murmur, click, rub or gallop GI: soft, non-tender; bowel sounds normal; no masses,  no organomegaly Extremities: extremities normal, atraumatic, no cyanosis or edema Neurologic: Alert and oriented X 3, normal strength and tone. Normal symmetric reflexes. Normal coordination and gait  ECOG PERFORMANCE STATUS: 2 - Symptomatic, <50% confined to bed  Blood pressure 111/82, pulse 94, temperature 98.4 F (36.9 C), temperature source Oral, resp. rate 20, height 6' (1.829 m), weight 176 lb 3.2 oz (79.924 kg), SpO2 93 %.  LABORATORY DATA: Lab Results  Component Value Date   WBC 2.9* 12/16/2014   HGB 10.5* 12/16/2014   HCT 31.3* 12/16/2014   MCV 102.0* 12/16/2014   PLT 182 12/16/2014      Chemistry      Component Value Date/Time   NA 138 12/16/2014 1000   NA 138 10/19/2014 1306   K 3.8 12/16/2014 1000   K 4.4 10/19/2014 1306   CL 106 10/19/2014 1306   CL 105 07/20/2012 1327   CO2 24 12/16/2014 1000   CO2 28 10/19/2014 1306   BUN 18.1 12/16/2014 1000   BUN 17 10/19/2014 1306    CREATININE 1.5* 12/16/2014 1000   CREATININE 1.24 10/19/2014 1306      Component Value Date/Time   CALCIUM 9.3 12/16/2014 1000   CALCIUM 9.8 10/19/2014 1306   ALKPHOS 62 12/16/2014 1000   ALKPHOS 70 10/19/2014 1306   AST 17 12/16/2014 1000   AST 24 10/19/2014 1306   ALT 18 12/16/2014 1000   ALT 41 10/19/2014 1306   BILITOT 0.39 12/16/2014 1000   BILITOT 0.8 10/19/2014 1306       RADIOGRAPHIC STUDIES: Ct Chest W Contrast  11/21/2014   CLINICAL DATA:  Restaging right lung non-small cell carcinoma. On going chemotherapy and radiation therapy.  EXAM: CT CHEST, ABDOMEN, AND PELVIS WITH CONTRAST  TECHNIQUE: Multidetector CT imaging of the chest, abdomen and pelvis was performed following the standard protocol during bolus administration of intravenous contrast.  CONTRAST:  174mL OMNIPAQUE IOHEXOL 300 MG/ML  SOLN  COMPARISON:  PET-CT on 08/14/2014  FINDINGS: CT CHEST FINDINGS  Mediastinum/Lymph Nodes: Central right upper lobe mass is again seen which involves the right hilum and right mainstem bronchus, as well as the mediastinum in the right paratracheal region. This mass causes postobstructive collapse of the right upper lobe, also unchanged. This mass measures approximately 4.0 x 4.9 cm on image 19/series 2, which is unchanged compared to recent PET-CT. Mild subcarinal lymphadenopathy is stable measuring 1.4 cm on image 27. Other tiny  sub-cm mediastinal lymph nodes are unchanged.  Lungs/Pleura: Postobstructive right upper lobe collapse is again demonstrated due to the centrally obstructing right upper lobe mass. No other suspicious pulmonary nodules or masses are identified. Previously seen airspace disease in the medial right lower lobe has resolved since prior exam. No evidence of pleural effusion.  Musculoskeletal/Soft Tissues: No suspicious bone lesions or other significant chest wall abnormality.  CT ABDOMEN AND PELVIS FINDINGS  Hepatobiliary: No masses or other significant abnormality  identified.  Pancreas: No mass, inflammatory changes, or other significant abnormality identified.  Spleen:  Within normal limits in size and appearance.  Adrenals:  No masses identified.  Kidneys/Urinary Tract: No evidence of masses or hydronephrosis. Small right renal cysts, and other small sub-cm mediastinal lymph nodes.  Stomach/Bowel/Peritoneum: No evidence of wall thickening, mass, or obstruction.  Vascular/Lymphatic: No pathologically enlarged lymph nodes identified. No other significant abnormality visualized. Retro aortic left renal vein incidentally noted.  Reproductive:  No mass or other significant abnormality identified.  Other:  None.  Musculoskeletal:  No suspicious bone lesions identified.  IMPRESSION: No significant change in central right upper lobe mass with direct involvement of mediastinum, hilum, and right mainstem bronchus. Stable postobstructive right upper lobe collapse.  Stable mild subcarinal mediastinal lymphadenopathy.  Near complete resolution of right lower lobe airspace disease since prior exam, consistent with resolving infectious or inflammatory process.  No radiographic evidence of metastatic disease within the abdomen or pelvis although a tiny sub-cm upper abdominal celiac axis noted showed metabolic activity on recent PET. Continued attention on follow-up imaging is recommended.   Electronically Signed   By: Earle Gell M.D.   On: 11/21/2014 12:04   Ct Abdomen Pelvis W Contrast  11/21/2014   CLINICAL DATA:  Restaging right lung non-small cell carcinoma. On going chemotherapy and radiation therapy.  EXAM: CT CHEST, ABDOMEN, AND PELVIS WITH CONTRAST  TECHNIQUE: Multidetector CT imaging of the chest, abdomen and pelvis was performed following the standard protocol during bolus administration of intravenous contrast.  CONTRAST:  180mL OMNIPAQUE IOHEXOL 300 MG/ML  SOLN  COMPARISON:  PET-CT on 08/14/2014  FINDINGS: CT CHEST FINDINGS  Mediastinum/Lymph Nodes: Central right upper lobe  mass is again seen which involves the right hilum and right mainstem bronchus, as well as the mediastinum in the right paratracheal region. This mass causes postobstructive collapse of the right upper lobe, also unchanged. This mass measures approximately 4.0 x 4.9 cm on image 19/series 2, which is unchanged compared to recent PET-CT. Mild subcarinal lymphadenopathy is stable measuring 1.4 cm on image 27. Other tiny sub-cm mediastinal lymph nodes are unchanged.  Lungs/Pleura: Postobstructive right upper lobe collapse is again demonstrated due to the centrally obstructing right upper lobe mass. No other suspicious pulmonary nodules or masses are identified. Previously seen airspace disease in the medial right lower lobe has resolved since prior exam. No evidence of pleural effusion.  Musculoskeletal/Soft Tissues: No suspicious bone lesions or other significant chest wall abnormality.  CT ABDOMEN AND PELVIS FINDINGS  Hepatobiliary: No masses or other significant abnormality identified.  Pancreas: No mass, inflammatory changes, or other significant abnormality identified.  Spleen:  Within normal limits in size and appearance.  Adrenals:  No masses identified.  Kidneys/Urinary Tract: No evidence of masses or hydronephrosis. Small right renal cysts, and other small sub-cm mediastinal lymph nodes.  Stomach/Bowel/Peritoneum: No evidence of wall thickening, mass, or obstruction.  Vascular/Lymphatic: No pathologically enlarged lymph nodes identified. No other significant abnormality visualized. Retro aortic left renal vein incidentally noted.  Reproductive:  No mass or other significant abnormality identified.  Other:  None.  Musculoskeletal:  No suspicious bone lesions identified.  IMPRESSION: No significant change in central right upper lobe mass with direct involvement of mediastinum, hilum, and right mainstem bronchus. Stable postobstructive right upper lobe collapse.  Stable mild subcarinal mediastinal lymphadenopathy.   Near complete resolution of right lower lobe airspace disease since prior exam, consistent with resolving infectious or inflammatory process.  No radiographic evidence of metastatic disease within the abdomen or pelvis although a tiny sub-cm upper abdominal celiac axis noted showed metabolic activity on recent PET. Continued attention on follow-up imaging is recommended.   Electronically Signed   By: Earle Gell M.D.   On: 11/21/2014 12:04    ASSESSMENT AND PLAN: This is a very pleasant 69 years old African-American male with: 1) stage IV non-small cell lung cancer with neuroendocrine features: He status post short course of palliative radiotherapy to the right lung obstructing mass under the care of Dr. Pablo Ledger.  He is currently being treated with carboplatin for AUC of 5 and paclitaxel 175 MG/M2 with Neulasta support every 3 weeks, status post 4 cycles. Overall  He is tolerating his chemotherapy relatively well. The patient was discussed with and also seen by Dr. Julien Nordmann. His recent restaging CT scan was stable overall all with some improvement in the inflammatory process in the right lower lobe. There is a small/tiny subcentimeter upper abdominal celiac axis node that showed metabolic activity on the PET scan and will be closely followed on subsequent imaging studies. He'll proceed with cycle #5 today as scheduled. He will continue with weekly labs as scheduled and return in 3 weeks prior to the start of cycle #6.   The patient was advised to call immediately if he has any concerning symptoms in the interval. The patient voices understanding of current disease status and treatment options and is in agreement with the current care plan.  All questions were answered. The patient knows to call the clinic with any problems, questions or concerns. We can certainly see the patient much sooner if necessary.  Carlton Adam, PA-C 12/16/2014  ADDENDUM: Hematology/Oncology Attending: I had a face to  face encounter with the patient today. I recommended his care plan. This is a very pleasant 69 years old African-American male with stage IV non-small cell lung cancer. He is currently undergoing systemic chemotherapy with carboplatin and paclitaxel is status post 4 cycles. The patient is tolerating his treatment fairly well with no significant adverse effects. I recommended for him to proceed with cycle #5 today as scheduled. He would come back for follow-up visit in 3 weeks for reevaluation before starting cycle #6. He was advised to call immediately if he has any concerning symptoms in the interval.  Disclaimer: This note was dictated with voice recognition software. Similar sounding words can inadvertently be transcribed and may be missed upon review. Eilleen Kempf., MD 12/16/2014

## 2014-12-16 NOTE — Patient Instructions (Signed)
[facility] Discharge Instructions for Patients Receiving Chemotherapy  Today you received the following chemotherapy agents Taxol and Carboplatin.  To help prevent nausea and vomiting after your treatment, we encourage you to take your nausea medication as prescribed.   If you develop nausea and vomiting that is not controlled by your nausea medication, call the clinic.   BELOW ARE SYMPTOMS THAT SHOULD BE REPORTED IMMEDIATELY:  *FEVER GREATER THAN 100.5 F  *CHILLS WITH OR WITHOUT FEVER  NAUSEA AND VOMITING THAT IS NOT CONTROLLED WITH YOUR NAUSEA MEDICATION  *UNUSUAL SHORTNESS OF BREATH  *UNUSUAL BRUISING OR BLEEDING  TENDERNESS IN MOUTH AND THROAT WITH OR WITHOUT PRESENCE OF ULCERS  *URINARY PROBLEMS  *BOWEL PROBLEMS  UNUSUAL RASH Items with * indicate a potential emergency and should be followed up as soon as possible.  Feel free to call the clinic you have any questions or concerns. The clinic phone number is [phone].  Please show the CHEMO ALERT CARD at check-in to the Emergency Department and triage nurse.   

## 2014-12-17 ENCOUNTER — Ambulatory Visit: Payer: Medicare HMO

## 2014-12-18 ENCOUNTER — Telehealth: Payer: Self-pay | Admitting: Internal Medicine

## 2014-12-18 ENCOUNTER — Ambulatory Visit (HOSPITAL_BASED_OUTPATIENT_CLINIC_OR_DEPARTMENT_OTHER): Payer: Medicare HMO

## 2014-12-18 VITALS — BP 125/75 | HR 91 | Temp 98.3°F

## 2014-12-18 DIAGNOSIS — C3491 Malignant neoplasm of unspecified part of right bronchus or lung: Secondary | ICD-10-CM

## 2014-12-18 DIAGNOSIS — C3411 Malignant neoplasm of upper lobe, right bronchus or lung: Secondary | ICD-10-CM | POA: Diagnosis not present

## 2014-12-18 DIAGNOSIS — Z5189 Encounter for other specified aftercare: Secondary | ICD-10-CM

## 2014-12-18 MED ORDER — PEGFILGRASTIM INJECTION 6 MG/0.6ML ~~LOC~~
6.0000 mg | PREFILLED_SYRINGE | Freq: Once | SUBCUTANEOUS | Status: AC
  Administered 2014-12-18: 6 mg via SUBCUTANEOUS
  Filled 2014-12-18: qty 0.6

## 2014-12-18 NOTE — Telephone Encounter (Signed)
Patient and relative stopped by scheduling today for appointments. Patient was seen 12/16/14 and no pof has been sent. Based on previous appointments and 12/16/14 office note I added weekly labs (standing) - lab/KC 5/2 and tx 5/3. Message to AJ/MM informing them and asking that pof be sent and to let me know if f/u with [facility] is not ok. MM full and AJ out on PAL the week of 5/2. Patient relative given avs report and appointments for April and May.

## 2014-12-23 ENCOUNTER — Other Ambulatory Visit (HOSPITAL_BASED_OUTPATIENT_CLINIC_OR_DEPARTMENT_OTHER): Payer: Medicare HMO

## 2014-12-23 DIAGNOSIS — C3491 Malignant neoplasm of unspecified part of right bronchus or lung: Secondary | ICD-10-CM | POA: Diagnosis not present

## 2014-12-23 LAB — CBC WITH DIFFERENTIAL/PLATELET
BASO%: 0.1 % (ref 0.0–2.0)
Basophils Absolute: 0 10*3/uL (ref 0.0–0.1)
EOS%: 0.1 % (ref 0.0–7.0)
Eosinophils Absolute: 0 10*3/uL (ref 0.0–0.5)
HEMATOCRIT: 32.1 % — AB (ref 38.4–49.9)
HGB: 10.7 g/dL — ABNORMAL LOW (ref 13.0–17.1)
LYMPH#: 0.7 10*3/uL — AB (ref 0.9–3.3)
LYMPH%: 6.3 % — ABNORMAL LOW (ref 14.0–49.0)
MCH: 34.4 pg — AB (ref 27.2–33.4)
MCHC: 33.3 g/dL (ref 32.0–36.0)
MCV: 103.2 fL — ABNORMAL HIGH (ref 79.3–98.0)
MONO#: 1.8 10*3/uL — ABNORMAL HIGH (ref 0.1–0.9)
MONO%: 17.3 % — ABNORMAL HIGH (ref 0.0–14.0)
NEUT#: 8 10*3/uL — ABNORMAL HIGH (ref 1.5–6.5)
NEUT%: 76.2 % — ABNORMAL HIGH (ref 39.0–75.0)
Platelets: 171 10*3/uL (ref 140–400)
RBC: 3.11 10*6/uL — AB (ref 4.20–5.82)
RDW: 15.9 % — AB (ref 11.0–14.6)
WBC: 10.4 10*3/uL — ABNORMAL HIGH (ref 4.0–10.3)

## 2014-12-23 LAB — COMPREHENSIVE METABOLIC PANEL (CC13)
ALT: 16 U/L (ref 0–55)
AST: 15 U/L (ref 5–34)
Albumin: 3.4 g/dL — ABNORMAL LOW (ref 3.5–5.0)
Alkaline Phosphatase: 82 U/L (ref 40–150)
Anion Gap: 16 mEq/L — ABNORMAL HIGH (ref 3–11)
BUN: 15.8 mg/dL (ref 7.0–26.0)
CO2: 20 meq/L — AB (ref 22–29)
Calcium: 9.7 mg/dL (ref 8.4–10.4)
Chloride: 103 mEq/L (ref 98–109)
Creatinine: 1.5 mg/dL — ABNORMAL HIGH (ref 0.7–1.3)
EGFR: 57 mL/min/{1.73_m2} — AB (ref >90)
Glucose: 117 mg/dl (ref 70–140)
Potassium: 4.3 mEq/L (ref 3.5–5.1)
SODIUM: 139 meq/L (ref 136–145)
TOTAL PROTEIN: 9.2 g/dL — AB (ref 6.4–8.3)
Total Bilirubin: <0.2 mg/dL (ref 0.20–1.20)

## 2014-12-26 ENCOUNTER — Encounter (HOSPITAL_COMMUNITY): Payer: Self-pay

## 2014-12-26 ENCOUNTER — Inpatient Hospital Stay (HOSPITAL_COMMUNITY)
Admission: EM | Admit: 2014-12-26 | Discharge: 2014-12-29 | DRG: 194 | Disposition: A | Discharge status: Home Health | Payer: Medicare HMO | Attending: Internal Medicine | Admitting: Internal Medicine

## 2014-12-26 ENCOUNTER — Emergency Department (HOSPITAL_COMMUNITY): Payer: Medicare HMO

## 2014-12-26 DIAGNOSIS — N179 Acute kidney failure, unspecified: Secondary | ICD-10-CM | POA: Diagnosis present

## 2014-12-26 DIAGNOSIS — J449 Chronic obstructive pulmonary disease, unspecified: Secondary | ICD-10-CM | POA: Diagnosis present

## 2014-12-26 DIAGNOSIS — Z7951 Long term (current) use of inhaled steroids: Secondary | ICD-10-CM

## 2014-12-26 DIAGNOSIS — I129 Hypertensive chronic kidney disease with stage 1 through stage 4 chronic kidney disease, or unspecified chronic kidney disease: Secondary | ICD-10-CM | POA: Diagnosis present

## 2014-12-26 DIAGNOSIS — J189 Pneumonia, unspecified organism: Secondary | ICD-10-CM | POA: Diagnosis present

## 2014-12-26 DIAGNOSIS — Z79899 Other long term (current) drug therapy: Secondary | ICD-10-CM | POA: Diagnosis not present

## 2014-12-26 DIAGNOSIS — C3490 Malignant neoplasm of unspecified part of unspecified bronchus or lung: Secondary | ICD-10-CM | POA: Diagnosis not present

## 2014-12-26 DIAGNOSIS — N183 Chronic kidney disease, stage 3 (moderate): Secondary | ICD-10-CM | POA: Diagnosis present

## 2014-12-26 DIAGNOSIS — D7589 Other specified diseases of blood and blood-forming organs: Secondary | ICD-10-CM | POA: Diagnosis present

## 2014-12-26 DIAGNOSIS — H5442 Blindness, left eye, normal vision right eye: Secondary | ICD-10-CM | POA: Diagnosis present

## 2014-12-26 DIAGNOSIS — D638 Anemia in other chronic diseases classified elsewhere: Secondary | ICD-10-CM | POA: Diagnosis present

## 2014-12-26 DIAGNOSIS — Z87891 Personal history of nicotine dependence: Secondary | ICD-10-CM

## 2014-12-26 DIAGNOSIS — I10 Essential (primary) hypertension: Secondary | ICD-10-CM | POA: Diagnosis present

## 2014-12-26 DIAGNOSIS — Y95 Nosocomial condition: Secondary | ICD-10-CM | POA: Diagnosis present

## 2014-12-26 DIAGNOSIS — D6481 Anemia due to antineoplastic chemotherapy: Secondary | ICD-10-CM | POA: Diagnosis not present

## 2014-12-26 DIAGNOSIS — C3491 Malignant neoplasm of unspecified part of right bronchus or lung: Secondary | ICD-10-CM | POA: Diagnosis present

## 2014-12-26 DIAGNOSIS — R0602 Shortness of breath: Secondary | ICD-10-CM | POA: Diagnosis present

## 2014-12-26 DIAGNOSIS — Z9221 Personal history of antineoplastic chemotherapy: Secondary | ICD-10-CM

## 2014-12-26 DIAGNOSIS — N189 Chronic kidney disease, unspecified: Secondary | ICD-10-CM | POA: Diagnosis present

## 2014-12-26 LAB — CBC WITH DIFFERENTIAL/PLATELET
Basophils Absolute: 0 10*3/uL (ref 0.0–0.1)
Basophils Relative: 0 % (ref 0–1)
EOS PCT: 0 % (ref 0–5)
Eosinophils Absolute: 0 10*3/uL (ref 0.0–0.7)
HCT: 32.3 % — ABNORMAL LOW (ref 39.0–52.0)
Hemoglobin: 10.6 g/dL — ABNORMAL LOW (ref 13.0–17.0)
Lymphocytes Relative: 16 % (ref 12–46)
Lymphs Abs: 1.7 10*3/uL (ref 0.7–4.0)
MCH: 34.2 pg — ABNORMAL HIGH (ref 26.0–34.0)
MCHC: 32.8 g/dL (ref 30.0–36.0)
MCV: 104.2 fL — AB (ref 78.0–100.0)
MONOS PCT: 13 % — AB (ref 3–12)
Monocytes Absolute: 1.4 10*3/uL — ABNORMAL HIGH (ref 0.1–1.0)
NEUTROS PCT: 71 % (ref 43–77)
Neutro Abs: 7.7 10*3/uL (ref 1.7–7.7)
PLATELETS: 185 10*3/uL (ref 150–400)
RBC: 3.1 MIL/uL — AB (ref 4.22–5.81)
RDW: 15.6 % — ABNORMAL HIGH (ref 11.5–15.5)
WBC: 10.8 10*3/uL — AB (ref 4.0–10.5)

## 2014-12-26 LAB — BASIC METABOLIC PANEL
ANION GAP: 7 (ref 5–15)
BUN: 22 mg/dL (ref 6–23)
CALCIUM: 9.2 mg/dL (ref 8.4–10.5)
CO2: 23 mmol/L (ref 19–32)
Chloride: 104 mmol/L (ref 96–112)
Creatinine, Ser: 1.85 mg/dL — ABNORMAL HIGH (ref 0.50–1.35)
GFR, EST AFRICAN AMERICAN: 42 mL/min — AB (ref >90)
GFR, EST NON AFRICAN AMERICAN: 36 mL/min — AB (ref >90)
Glucose, Bld: 134 mg/dL — ABNORMAL HIGH (ref 70–99)
Potassium: 3.9 mmol/L (ref 3.5–5.1)
SODIUM: 134 mmol/L — AB (ref 135–145)

## 2014-12-26 LAB — COMPREHENSIVE METABOLIC PANEL
ALBUMIN: 3.6 g/dL (ref 3.5–5.2)
ALK PHOS: 78 U/L (ref 39–117)
ALT: 16 U/L (ref 0–53)
AST: 22 U/L (ref 0–37)
Anion gap: 10 (ref 5–15)
BUN: 23 mg/dL (ref 6–23)
CHLORIDE: 102 mmol/L (ref 96–112)
CO2: 22 mmol/L (ref 19–32)
Calcium: 9.2 mg/dL (ref 8.4–10.5)
Creatinine, Ser: 1.88 mg/dL — ABNORMAL HIGH (ref 0.50–1.35)
GFR calc Af Amer: 41 mL/min — ABNORMAL LOW (ref >90)
GFR calc non Af Amer: 35 mL/min — ABNORMAL LOW (ref >90)
Glucose, Bld: 131 mg/dL — ABNORMAL HIGH (ref 70–99)
POTASSIUM: 3.8 mmol/L (ref 3.5–5.1)
Sodium: 134 mmol/L — ABNORMAL LOW (ref 135–145)
Total Bilirubin: 0.2 mg/dL — ABNORMAL LOW (ref 0.3–1.2)
Total Protein: 9.8 g/dL — ABNORMAL HIGH (ref 6.0–8.3)

## 2014-12-26 LAB — INFLUENZA PANEL BY PCR (TYPE A & B)
H1N1 flu by pcr: NOT DETECTED
Influenza A By PCR: NEGATIVE
Influenza B By PCR: NEGATIVE

## 2014-12-26 MED ORDER — LINEZOLID 600 MG/300ML IV SOLN
600.0000 mg | Freq: Once | INTRAVENOUS | Status: AC
  Administered 2014-12-26: 600 mg via INTRAVENOUS
  Filled 2014-12-26: qty 300

## 2014-12-26 MED ORDER — ALBUTEROL SULFATE (2.5 MG/3ML) 0.083% IN NEBU: 2.5000 mg | INHALATION_SOLUTION | Freq: Four times a day (QID) | RESPIRATORY_TRACT | Status: DC | PRN

## 2014-12-26 MED ORDER — DEXTROSE 5 % IV SOLN
2.0000 g | INTRAVENOUS | Status: DC
  Filled 2014-12-26: qty 2

## 2014-12-26 MED ORDER — ACETAMINOPHEN 650 MG RE SUPP: 650.0000 mg | Freq: Four times a day (QID) | RECTAL | Status: DC | PRN

## 2014-12-26 MED ORDER — AZITHROMYCIN 250 MG PO TABS
500.0000 mg | ORAL_TABLET | Freq: Once | ORAL | Status: AC
  Administered 2014-12-26: 500 mg via ORAL
  Filled 2014-12-26: qty 2

## 2014-12-26 MED ORDER — PROCHLORPERAZINE MALEATE 10 MG PO TABS
10.0000 mg | ORAL_TABLET | Freq: Four times a day (QID) | ORAL | Status: DC | PRN
  Filled 2014-12-26: qty 1

## 2014-12-26 MED ORDER — DEXTROSE 5 % IV SOLN
2.0000 g | Freq: Once | INTRAVENOUS | Status: AC
  Administered 2014-12-26: 2 g via INTRAVENOUS
  Filled 2014-12-26: qty 2

## 2014-12-26 MED ORDER — IPRATROPIUM-ALBUTEROL 0.5-2.5 (3) MG/3ML IN SOLN
3.0000 mL | Freq: Three times a day (TID) | RESPIRATORY_TRACT | Status: DC
  Administered 2014-12-26 – 2014-12-29 (×9): 3 mL via RESPIRATORY_TRACT
  Filled 2014-12-26 (×8): qty 3

## 2014-12-26 MED ORDER — BUDESONIDE-FORMOTEROL FUMARATE 160-4.5 MCG/ACT IN AERO
2.0000 | INHALATION_SPRAY | Freq: Two times a day (BID) | RESPIRATORY_TRACT | Status: DC
  Administered 2014-12-26 – 2014-12-29 (×7): 2 via RESPIRATORY_TRACT
  Filled 2014-12-26: qty 6

## 2014-12-26 MED ORDER — IPRATROPIUM-ALBUTEROL 0.5-2.5 (3) MG/3ML IN SOLN
3.0000 mL | Freq: Four times a day (QID) | RESPIRATORY_TRACT | Status: DC
  Administered 2014-12-26 (×2): 3 mL via RESPIRATORY_TRACT
  Filled 2014-12-26 (×2): qty 3

## 2014-12-26 MED ORDER — PIPERACILLIN-TAZOBACTAM 3.375 G IVPB
3.3750 g | Freq: Three times a day (TID) | INTRAVENOUS | Status: DC
  Administered 2014-12-26 – 2014-12-29 (×8): 3.375 g via INTRAVENOUS
  Filled 2014-12-26 (×9): qty 50

## 2014-12-26 MED ORDER — ONDANSETRON HCL 4 MG PO TABS: 4.0000 mg | ORAL_TABLET | Freq: Four times a day (QID) | ORAL | Status: DC | PRN

## 2014-12-26 MED ORDER — ACETAMINOPHEN 325 MG PO TABS
650.0000 mg | ORAL_TABLET | Freq: Four times a day (QID) | ORAL | Status: DC | PRN
  Administered 2014-12-26: 650 mg via ORAL
  Filled 2014-12-26: qty 2

## 2014-12-26 MED ORDER — LINEZOLID 600 MG/300ML IV SOLN
600.0000 mg | Freq: Two times a day (BID) | INTRAVENOUS | Status: DC
  Administered 2014-12-26 – 2014-12-28 (×5): 600 mg via INTRAVENOUS
  Filled 2014-12-26 (×6): qty 300

## 2014-12-26 MED ORDER — ENOXAPARIN SODIUM 40 MG/0.4ML ~~LOC~~ SOLN
40.0000 mg | SUBCUTANEOUS | Status: DC
  Administered 2014-12-26 – 2014-12-29 (×4): 40 mg via SUBCUTANEOUS
  Filled 2014-12-26 (×4): qty 0.4

## 2014-12-26 MED ORDER — ONDANSETRON HCL 4 MG/2ML IJ SOLN: 4.0000 mg | Freq: Four times a day (QID) | INTRAMUSCULAR | Status: DC | PRN

## 2014-12-26 MED ORDER — VANCOMYCIN HCL 10 G IV SOLR
1500.0000 mg | Freq: Once | INTRAVENOUS | Status: DC
  Filled 2014-12-26: qty 1500

## 2014-12-26 MED ORDER — AMLODIPINE BESYLATE 10 MG PO TABS
10.0000 mg | ORAL_TABLET | Freq: Every day | ORAL | Status: DC
  Administered 2014-12-26 – 2014-12-29 (×4): 10 mg via ORAL
  Filled 2014-12-26 (×4): qty 1

## 2014-12-26 MED ORDER — SODIUM CHLORIDE 0.9 % IV SOLN
INTRAVENOUS | Status: AC
  Administered 2014-12-26: 20:00:00 via INTRAVENOUS

## 2014-12-26 NOTE — ED Notes (Signed)
Dr. Kakrakandy at bedside. 

## 2014-12-26 NOTE — ED Notes (Signed)
Patient transported to CT 

## 2014-12-26 NOTE — Progress Notes (Signed)
CARE MANAGEMENT NOTE 12/26/2014  Patient:  ASPEN, DETERDING   Account Number:  [id number]  Date Initiated:  12/26/2014  Documentation initiated by:  DAVIS,RHONDA  Subjective/Objective Assessment:   pna in a immocomprised patient -recent chemo, lung ca     Action/Plan:   home when stable   Anticipated DC Date:  12/29/2014   Anticipated DC Plan:  HOME/SELF CARE  In-house referral  NA      DC Planning Services  CM consult      PAC Choice  NA   Choice offered to / List presented to:  NA           Status of service:  In process, will continue to follow Medicare Important Message given?   (If response is "NO", the following Medicare IM given date fields will be blank) Date Medicare IM given:   Medicare IM given by:   Date Additional Medicare IM given:   Additional Medicare IM given by:    Discharge Disposition:    Per UR Regulation:  Reviewed for med. necessity/level of care/duration of stay  If discussed at [place] of Stay Meetings, dates discussed:    Comments:  December 26, 2014/Rhonda L. Rosana Hoes, RN, BSN, CCM. Case Management [place] [phone] No discharge needs present of time of review.

## 2014-12-26 NOTE — ED Notes (Signed)
Patient returned from CT

## 2014-12-26 NOTE — Progress Notes (Signed)
CSW met with pt and his nephew, Mr. Erling Cruz, this am to assist with d/c planning. Pt will return home when medically stable. Family has requested [facility] services. Family contact is : Linton Ham [phone]. Will request RNCM to assist with d/c planning.  Werner Lean LCSW [phone]

## 2014-12-26 NOTE — ED Notes (Signed)
Patient c/o nonproductive cough x 3 days.  Current being treated for lung CA.  Has been exposed to sick children this week.

## 2014-12-26 NOTE — ED Provider Notes (Signed)
CSN: [id number]     Arrival date & time 12/26/14  0962 History   First MD Initiated Contact with Patient 12/26/14 0400     Chief Complaint  Patient presents with  . Cough     (Consider location/radiation/quality/duration/timing/severity/associated sxs/prior Treatment) HPI Stephen Ellis is a 69 y.o. male with past medical history of right-sided lung cancer currently on chemotherapy. Last session was 2 weeks ago. Patient presents with nonproductive cough for the past 2 days. 3 days ago he was exposed to sick contacts and little children who tested positive for insulin so. He denies any fevers during the interval. He has rhinorrhea as well. Patient has no body aches or congestion. Nothing made his symptoms better or worse. Patient has no further complaints.  10 Systems reviewed and are negative for acute change except as noted in the HPI.    Past Medical History  Diagnosis Date  . Syncope   . Dehydration   . Blind left eye   . Hypertension   . Anemia     "low Blood"  . Dementia     poor memory  . Cancer     Lung  . Lung cancer 08/12/14    Right lung   Past Surgical History  Procedure Laterality Date  . No past surgeries    . Video bronchoscopy Bilateral 07/29/2014    Procedure: VIDEO BRONCHOSCOPY WITHOUT FLUORO;  Surgeon: Tanda Rockers, MD;  Location: [facility] ENDOSCOPY;  Service: Cardiopulmonary;  Laterality: Bilateral;  . Video bronchoscopy Bilateral 08/12/2014    Procedure: VIDEO BRONCHOSCOPY WITHOUT FLUORO;  Surgeon: Wilhelmina Mcardle, MD;  Location: [facility] ENDOSCOPY;  Service: Cardiopulmonary;  Laterality: Bilateral;   History reviewed. No pertinent family history. History  Substance Use Topics  . Smoking status: Former Smoker -- 1.00 packs/day for 38 years    Quit date: 06/24/2014  . Smokeless tobacco: Never Used  . Alcohol Use: No    Review of Systems    Allergies  Review of patient's allergies indicates no known allergies.  Home Medications   Prior to Admission  medications   Medication Sig Start Date End Date Taking? Authorizing Provider  albuterol (PROVENTIL) (2.5 MG/3ML) 0.083% nebulizer solution Take 3 mLs (2.5 mg total) by nebulization every 6 (six) hours as needed for wheezing or shortness of breath. 10/19/14   Ezequiel Essex, MD  amLODipine (NORVASC) 10 MG tablet Take 1 tablet (10 mg total) by mouth daily. 10/14/14   Susanne Borders, NP  budesonide-formoterol [facility]) 160-4.5 MCG/ACT inhaler Inhale 2 puffs into the lungs 2 (two) times daily. 10/14/14   Susanne Borders, NP  ipratropium-albuterol (DUONEB) 0.5-2.5 (3) MG/3ML SOLN Take 3 mLs by nebulization every 6 (six) hours. 08/09/14   Thurnell Lose, MD  prochlorperazine (COMPAZINE) 10 MG tablet Take 1 tablet (10 mg total) by mouth every 6 (six) hours as needed for nausea or vomiting. Patient not taking: Reported on 11/24/2014 09/22/14   Curt Bears, MD   BP 102/87 mmHg  Pulse 102  Temp(Src) 98.1 F (36.7 C) (Oral)  Resp 20  SpO2 95% Physical Exam  Constitutional: He is oriented to person, place, and time. Vital signs are normal. He appears well-developed and well-nourished.  Non-toxic appearance. He does not appear ill. No distress.  HENT:  Head: Normocephalic and atraumatic.  Nose: Nose normal.  Mouth/Throat: Oropharynx is clear and moist. No oropharyngeal exudate.  Eyes: EOM are normal. No scleral icterus.  Left eye status post posttraumatic injury. Pupil is nonreactive.  Neck: Normal range  of motion. Neck supple. No tracheal deviation, no edema, no erythema and normal range of motion present. No thyroid mass and no thyromegaly present.  Cardiovascular: Normal rate, regular rhythm, S1 normal, S2 normal, normal heart sounds, intact distal pulses and normal pulses.  Exam reveals no gallop and no friction rub.   No murmur heard. Pulses:      Radial pulses are 2+ on the right side, and 2+ on the left side.       Dorsalis pedis pulses are 2+ on the right side, and 2+ on the left side.   Pulmonary/Chest: Effort normal. No respiratory distress. He has wheezes. He has no rhonchi. He has no rales.  Rhonchorous noises heard throughout.  Abdominal: Soft. Normal appearance and bowel sounds are normal. He exhibits no distension, no ascites and no mass. There is no hepatosplenomegaly. There is no tenderness. There is no rebound, no guarding and no CVA tenderness.  Musculoskeletal: Normal range of motion. He exhibits no edema or tenderness.  Lymphadenopathy:    He has no cervical adenopathy.  Neurological: He is alert and oriented to person, place, and time. He has normal strength. No cranial nerve deficit or sensory deficit. He exhibits normal muscle tone.  Skin: Skin is warm, dry and intact. No petechiae and no rash noted. He is not diaphoretic. No erythema. No pallor.  Psychiatric: He has a normal mood and affect. His behavior is normal. Judgment normal.  Nursing note and vitals reviewed.   ED Course  Procedures (including critical care time) Labs Review Labs Reviewed  CBC WITH DIFFERENTIAL/PLATELET - Abnormal; Notable for the following:    WBC 10.8 (*)    RBC 3.10 (*)    Hemoglobin 10.6 (*)    HCT 32.3 (*)    MCV 104.2 (*)    MCH 34.2 (*)    RDW 15.6 (*)    Monocytes Relative 13 (*)    Monocytes Absolute 1.4 (*)    All other components within normal limits  BASIC METABOLIC PANEL - Abnormal; Notable for the following:    Sodium 134 (*)    Glucose, Bld 134 (*)    Creatinine, Ser 1.85 (*)    GFR calc non Af Amer 36 (*)    GFR calc Af Amer 42 (*)    All other components within normal limits  CULTURE, BLOOD (ROUTINE X 2)  CULTURE, BLOOD (ROUTINE X 2)    Imaging Review Ct Chest Wo Contrast  12/26/2014   CLINICAL DATA:  Nonproductive cough for 3 days. Ongoing chemotherapy and radiation for right lung cancer.  EXAM: CT CHEST WITHOUT CONTRAST  TECHNIQUE: Multidetector CT imaging of the chest was performed following the standard protocol without IV contrast.  COMPARISON:   11/21/2014  FINDINGS: CT CHEST FINDINGS  Mediastinum/Nodes: Heart is normal size. Aorta is normal caliber. 1.4 cm subcarinal lymph node is stable. Other small scattered subcentimeric lymph nodes in the mediastinum. No axillary adenopathy. No visible hilar adenopathy on this unenhanced study.  Lungs/Pleura: Continued postobstructive collapse of the right upper lobe. Obstructing central right upper lobe mass again noted, grossly unchanged, difficult to measure exactly will without intravenous contrast and due to the adjacent collapsed right upper lobe. New area of consolidation in the right lower lobe compatible with pneumonia. No pleural effusions.  Musculoskeletal: Chest wall soft tissues are unremarkable. No acute bony abnormality or focal bone lesion.  Upper abdomen: Imaging into the upper abdomen shows no acute findings.  IMPRESSION: New area consolidation in the right lower lobe  compatible with pneumonia.  Stable central obstructing right upper lobe mass with right upper lobe collapse. Stable subcarinal adenopathy.   Electronically Signed   By: Rolm Baptise M.D.   On: 12/26/2014 04:55     EKG Interpretation None      MDM   Final diagnoses:  None    Patient since emergency department for nontoxic cough 3 days. He is currently treated for lung cancer on chemotherapy. He has a large right-sided mass and his airway. Chest x-ray will be futile in this setting. Will obtain CT of chest to evaluate for pneumonia. Influenza swab will also be obtained.  CT scan does reveal right lower lobe pneumonia. Patient has hospital-acquired pneumonia as he was admitted in February and also had chemotherapy 2 weeks ago in the hospital. He was given vancomycin, cefepime, azithromycin for treatment. Blood cultures were drawn. Patient be admitted to try hospitalist for continued management.    Everlene Balls, MD 12/26/14 [phone]

## 2014-12-26 NOTE — Progress Notes (Signed)
ANTIBIOTIC CONSULT NOTE - INITIAL  Pharmacy Consult for cefepime Indication: HCAP  No Known Allergies    Vital Signs: Temp: 98.6 F (37 C) (04/22 0728) Temp Source: Oral (04/22 0728) BP: 126/80 mmHg (04/22 0728) Pulse Rate: 92 (04/22 0728) Intake/Output from previous day:   Intake/Output from this shift:    Labs:  Recent Labs  12/23/14 0946 12/23/14 0947 12/26/14 0438 12/26/14 0442  WBC 10.4*  --  10.8*  --   HGB 10.7*  --  10.6*  --   PLT 171  --  185  --   CREATININE  --  1.5* 1.85* 1.88*   Estimated Creatinine Clearance: 41.8 mL/min (by C-G formula based on Cr of 1.88). No results for input(s): VANCOTROUGH, VANCOPEAK, VANCORANDOM, GENTTROUGH, GENTPEAK, GENTRANDOM, TOBRATROUGH, TOBRAPEAK, TOBRARND, AMIKACINPEAK, AMIKACINTROU, AMIKACIN in the last 72 hours.   Microbiology: No results found for this or any previous visit (from the past 720 hour(s)).  Medical History: Past Medical History  Diagnosis Date  . Syncope   . Dehydration   . Blind left eye   . Hypertension   . Anemia     "low Blood"  . Dementia     poor memory  . Cancer     Lung  . Lung cancer 08/12/14    Right lung     Assessment: 69 y.o. male with history of bronchogenic carcinoma who has had his last chemotherapy 2 weeks ago presents to the ER because of persistent cough over the last 3 days. When patient has coffee also get short of breath. Denies any production of sputum. Denies any chest pain. Denies any fever or chills. In the ER patient had a CT chest done without contrast which shows developing pneumonia.  Pharmacy consulted to dose cefepime.  Scr 1.8, CrCl~42.9ms/min  Goal of Therapy:  Cefepime per renal function   Plan:  Cefepime 2gm IV q24h Follow renal function, cultures, clinical course  [facility]4/22/2016, 8:28 AM Pager [phone]

## 2014-12-26 NOTE — H&P (Addendum)
[facility] History and Physical  Stephen Ellis QMV:[id number] DOB: 09-03-1946 DOA: 12/26/2014  Referring physician: Dr.Oni. PCP: Elizabeth Palau, MD  Specialists: Dr.Mohamed. Oncologist.  Chief Complaint: Cough.  HPI: Stephen Ellis is a 69 y.o. male with history of bronchogenic carcinoma who has had his last chemotherapy 2 weeks ago presents to the ER because of persistent cough over the last 3 days. When patient has coffee also get short of breath. Denies any production of sputum. Denies any chest pain. Denies any fever or chills. In the ER patient had a CT chest done without contrast which shows developing pneumonia. Patient has been admitted for IV antibiotics. On exam patient is not in distress. Patient during last admission had elevated creatinine from vancomycin so patient was started on Zyvox and cefepime. Patient at this time is afebrile. Pneumonia is most likely postobstructive.   Review of Systems: As presented in the history of presenting illness, rest negative.  Past Medical History  Diagnosis Date  . Syncope   . Dehydration   . Blind left eye   . Hypertension   . Anemia     "low Blood"  . Dementia     poor memory  . Cancer     Lung  . Lung cancer 08/12/14    Right lung   Past Surgical History  Procedure Laterality Date  . No past surgeries    . Video bronchoscopy Bilateral 07/29/2014    Procedure: VIDEO BRONCHOSCOPY WITHOUT FLUORO;  Surgeon: Tanda Rockers, MD;  Location: [facility] ENDOSCOPY;  Service: Cardiopulmonary;  Laterality: Bilateral;  . Video bronchoscopy Bilateral 08/12/2014    Procedure: VIDEO BRONCHOSCOPY WITHOUT FLUORO;  Surgeon: Wilhelmina Mcardle, MD;  Location: [facility] ENDOSCOPY;  Service: Cardiopulmonary;  Laterality: Bilateral;   Social History:  reports that he quit smoking about 6 months ago. He has never used smokeless tobacco. He reports that he does not drink alcohol or use illicit drugs. Where does patient live home. Can patient participate in  ADLs? Yes.  No Known Allergies  Family History:  Family History  Problem Relation Age of Onset  . Hypertension Brother       Prior to Admission medications   Medication Sig Start Date End Date Taking? Authorizing Provider  acetaminophen-codeine (TYLENOL #3) 300-30 MG per tablet Take 1 tablet by mouth every 4 (four) hours as needed for moderate pain.   Yes Historical Provider, MD  albuterol (PROVENTIL) (2.5 MG/3ML) 0.083% nebulizer solution Take 3 mLs (2.5 mg total) by nebulization every 6 (six) hours as needed for wheezing or shortness of breath. 10/19/14  Yes Ezequiel Essex, MD  amLODipine (NORVASC) 10 MG tablet Take 1 tablet (10 mg total) by mouth daily. 10/14/14  Yes Susanne Borders, NP  budesonide-formoterol (SYMBICORT) 160-4.5 MCG/ACT inhaler Inhale 2 puffs into the lungs 2 (two) times daily. 10/14/14  Yes Susanne Borders, NP  ipratropium-albuterol (DUONEB) 0.5-2.5 (3) MG/3ML SOLN Take 3 mLs by nebulization every 6 (six) hours. 08/09/14  Yes Thurnell Lose, MD  prochlorperazine (COMPAZINE) 10 MG tablet Take 1 tablet (10 mg total) by mouth every 6 (six) hours as needed for nausea or vomiting. 09/22/14  Yes Curt Bears, MD    Physical Exam: Filed Vitals:   12/26/14 0344 12/26/14 0601  BP: 102/87 109/81  Pulse: 102 88  Temp: 98.1 F (36.7 C)   TempSrc: Oral   Resp: 20 20  SpO2: 95% 95%     General:  Moderately built and nourished.  Eyes: Anicteric no pallor. Left  eye opacity seen.  ENT: No discharge from ears eyes nose and mouth.  Neck: No mass felt.  Cardiovascular: S1-S2 heard.  Respiratory: No rhonchi or crepitations.  Abdomen: Soft nontender bowel sounds present.  Skin: No rash.  Musculoskeletal: No edema.  Psychiatric: Appears normal.  Neurologic: Alert awake oriented to time place and person. Moves all extremities.  Labs on Admission:  Basic Metabolic Panel:  Recent Labs Lab 12/23/14 0947 12/26/14 0438  NA 139 134*  K 4.3 3.9  CL  --  104  CO2  20* 23  GLUCOSE 117 134*  BUN 15.8 22  CREATININE 1.5* 1.85*  CALCIUM 9.7 9.2   Liver Function Tests:  Recent Labs Lab 12/23/14 0947  AST 15  ALT 16  ALKPHOS 82  BILITOT <0.20  PROT 9.2*  ALBUMIN 3.4*   No results for input(s): LIPASE, AMYLASE in the last 168 hours. No results for input(s): AMMONIA in the last 168 hours. CBC:  Recent Labs Lab 12/23/14 0946 12/26/14 0438  WBC 10.4* 10.8*  NEUTROABS 8.0* 7.7  HGB 10.7* 10.6*  HCT 32.1* 32.3*  MCV 103.2* 104.2*  PLT 171 185   Cardiac Enzymes: No results for input(s): CKTOTAL, CKMB, CKMBINDEX, TROPONINI in the last 168 hours.  BNP (last 3 results) No results for input(s): BNP in the last 8760 hours.  ProBNP (last 3 results) No results for input(s): PROBNP in the last 8760 hours.  CBG: No results for input(s): GLUCAP in the last 168 hours.  Radiological Exams on Admission: Ct Chest Wo Contrast  12/26/2014   CLINICAL DATA:  Nonproductive cough for 3 days. Ongoing chemotherapy and radiation for right lung cancer.  EXAM: CT CHEST WITHOUT CONTRAST  TECHNIQUE: Multidetector CT imaging of the chest was performed following the standard protocol without IV contrast.  COMPARISON:  11/21/2014  FINDINGS: CT CHEST FINDINGS  Mediastinum/Nodes: Heart is normal size. Aorta is normal caliber. 1.4 cm subcarinal lymph node is stable. Other small scattered subcentimeric lymph nodes in the mediastinum. No axillary adenopathy. No visible hilar adenopathy on this unenhanced study.  Lungs/Pleura: Continued postobstructive collapse of the right upper lobe. Obstructing central right upper lobe mass again noted, grossly unchanged, difficult to measure exactly will without intravenous contrast and due to the adjacent collapsed right upper lobe. New area of consolidation in the right lower lobe compatible with pneumonia. No pleural effusions.  Musculoskeletal: Chest wall soft tissues are unremarkable. No acute bony abnormality or focal bone lesion.   Upper abdomen: Imaging into the upper abdomen shows no acute findings.  IMPRESSION: New area consolidation in the right lower lobe compatible with pneumonia.  Stable central obstructing right upper lobe mass with right upper lobe collapse. Stable subcarinal adenopathy.   Electronically Signed   By: Rolm Baptise M.D.   On: 12/26/2014 04:55    Assessment/Plan Principal Problem:   Postobstructive pneumonia Active Problems:   Essential hypertension   Bronchogenic carcinoma of right lung   Anemia of chronic disease   Pneumonia   Renal failure (ARF), acute on chronic   1. Pneumonia - most likely postobstructive pneumonia. However we'll check influenza PCR. Patient had reaction to vancomycin during her stay with worsening creatinine function so patient has been started on cefepime and Zyvox. Patient was given 1 dose of Zyvox and if need further doses may have to discuss with infectious disease. 2. Acute on chronic kidney disease stage III - patient's creatinine is increased from baseline of 1.5. I'll place patient on gentle hydration. Closely follow intake output  and metabolic panel. 3. Chronic anemia - follow CBC. 4. Hypertension - continue home medications. 5. COPD - presently not wheezing. 6. 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 Patient received last chemotherapy 2 weeks ago.   DVT Prophylaxis Lovenox.  Code Status: Full code.  Family Communication: Patient's nephew Mr. Erling Cruz who is also the healthcare power of attorney.  Disposition Plan: Admit to inpatient. Likely stay 2-3 days.    Jessie Schrieber N. [facility] Pager [phone].  If 7PM-7AM, please contact night-coverage [email or web address] Password [email or web address] 12/26/2014, 6:30 AM

## 2014-12-26 NOTE — Progress Notes (Signed)
ANTIBIOTIC CONSULT NOTE - INITIAL  Pharmacy Consult for Zosyn Indication: HCAP  No Known Allergies  Patient Measurements:   Total body weight: 80kg  Vital Signs: Temp: 102.9 F (39.4 C) (04/22 1427) Temp Source: Oral (04/22 1427) BP: 123/71 mmHg (04/22 1320) Pulse Rate: 94 (04/22 1320) Intake/Output from previous day:   Intake/Output from this shift: Total I/O In: 450 [I.V.:450] Out: 2 [Urine:1; Stool:1]  Labs:  Recent Labs  12/26/14 0438 12/26/14 0442  WBC 10.8*  --   HGB 10.6*  --   PLT 185  --   CREATININE 1.85* 1.88*   Estimated Creatinine Clearance: 41.8 mL/min (by C-G formula based on Cr of 1.88). No results for input(s): VANCOTROUGH, VANCOPEAK, VANCORANDOM, GENTTROUGH, GENTPEAK, GENTRANDOM, TOBRATROUGH, TOBRAPEAK, TOBRARND, AMIKACINPEAK, AMIKACINTROU, AMIKACIN in the last 72 hours.   Microbiology: No results found for this or any previous visit (from the past 720 hour(s)).  Medical History: Past Medical History  Diagnosis Date  . Syncope   . Dehydration   . Blind left eye   . Hypertension   . Anemia     "low Blood"  . Dementia     poor memory  . Cancer     Lung  . Lung cancer 08/12/14    Right lung   Medications:  Scheduled:  . amLODipine  10 mg Oral Daily  . budesonide-formoterol  2 puff Inhalation BID  . enoxaparin (LOVENOX) injection  40 mg Subcutaneous Q24H  . ipratropium-albuterol  3 mL Nebulization TID  . linezolid  600 mg Intravenous Q12H  . piperacillin-tazobactam (ZOSYN)  IV  3.375 g Intravenous Q8H   Anti-infectives    Start     Dose/Rate Route Frequency Ordered Stop   12/27/14 0600  ceFEPIme (MAXIPIME) 2 g in dextrose 5 % 50 mL IVPB  Status:  Discontinued     2 g 100 mL/hr over 30 Minutes Intravenous Every 24 hours 12/26/14 0825 12/26/14 1653   12/26/14 1800  linezolid (ZYVOX) IVPB 600 mg     600 mg 300 mL/hr over 60 Minutes Intravenous Every 12 hours 12/26/14 1653     12/26/14 1800  piperacillin-tazobactam (ZOSYN) IVPB 3.375  g     3.375 g 12.5 mL/hr over 240 Minutes Intravenous Every 8 hours 12/26/14 1707     12/26/14 0545  vancomycin (VANCOCIN) 1,500 mg in sodium chloride 0.9 % 500 mL IVPB  Status:  Discontinued     1,500 mg 250 mL/hr over 120 Minutes Intravenous  Once 12/26/14 0526 12/26/14 0540   12/26/14 0545  linezolid (ZYVOX) IVPB 600 mg     600 mg 300 mL/hr over 60 Minutes Intravenous  Once 12/26/14 0541 12/26/14 0749   12/26/14 0530  ceFEPIme (MAXIPIME) 2 g in dextrose 5 % 50 mL IVPB     2 g 100 mL/hr over 30 Minutes Intravenous  Once 12/26/14 0526 12/26/14 0628   12/26/14 0500  azithromycin (ZITHROMAX) tablet 500 mg     500 mg Oral  Once 12/26/14 0458 12/26/14 0515     Assessment: 72 yoM to ED with SHOB/cough of 3 days duration. Hx of Lung Ca, C5 Carbo/Taxol completed 4/12 with Neulasta support 4/14. Treat for HCAP with Zyvox, and Cefepime initially. Hx of elevated SCr with Vancomycin last admit. Cefepime changed to Zosyn, pharmacy to dose.  Goal of Therapy:  Antibiotic appropriate for treatment, renal function  Plan:   Zosyn 3.375gm q8h - 4 hr infusion  Continuing Zyvox '600mg'$  IV q12  Monitor cultures, renal function  Nyoka Cowden, Nissi Doffing L  PharmD Pager [phone] 12/26/2014, 5:20 PM

## 2014-12-26 NOTE — Progress Notes (Addendum)
Patient admitted early this morning, agree with H&P. Seen and examined today, he is being hospitalized for HCAP. He is doing well, continue Zyvox (discussed with Dr. Linus Salmons) and Zosyn. Has mild AKI. His respiratory status is stable, no wheezing.   Closely monitor  Stephen M. Cruzita Lederer, MD [facility] [phone]

## 2014-12-27 LAB — COMPREHENSIVE METABOLIC PANEL
ALT: 20 U/L (ref 0–53)
AST: 20 U/L (ref 0–37)
Albumin: 2.7 g/dL — ABNORMAL LOW (ref 3.5–5.2)
Alkaline Phosphatase: 67 U/L (ref 39–117)
Anion gap: 8 (ref 5–15)
BUN: 15 mg/dL (ref 6–23)
CALCIUM: 8.9 mg/dL (ref 8.4–10.5)
CO2: 22 mmol/L (ref 19–32)
CREATININE: 1.54 mg/dL — AB (ref 0.50–1.35)
Chloride: 106 mmol/L (ref 96–112)
GFR calc non Af Amer: 45 mL/min — ABNORMAL LOW (ref >90)
GFR, EST AFRICAN AMERICAN: 52 mL/min — AB (ref >90)
Glucose, Bld: 98 mg/dL (ref 70–99)
Potassium: 4 mmol/L (ref 3.5–5.1)
Sodium: 136 mmol/L (ref 135–145)
TOTAL PROTEIN: 8.4 g/dL — AB (ref 6.0–8.3)
Total Bilirubin: 0.7 mg/dL (ref 0.3–1.2)

## 2014-12-27 LAB — CBC
HCT: 28.7 % — ABNORMAL LOW (ref 39.0–52.0)
HEMOGLOBIN: 9.4 g/dL — AB (ref 13.0–17.0)
MCH: 33.7 pg (ref 26.0–34.0)
MCHC: 32.8 g/dL (ref 30.0–36.0)
MCV: 102.9 fL — AB (ref 78.0–100.0)
Platelets: 171 10*3/uL (ref 150–400)
RBC: 2.79 MIL/uL — ABNORMAL LOW (ref 4.22–5.81)
RDW: 15.4 % (ref 11.5–15.5)
WBC: 13.3 10*3/uL — ABNORMAL HIGH (ref 4.0–10.5)

## 2014-12-27 NOTE — Progress Notes (Signed)
PROGRESS NOTE  [name] YTK:[id number] DOB: [birth year] DOA: 01/10/2015 PCP: Elizabeth Palau, MD  HPI: Stephen Ellis is a 69 y.o. male with history of bronchogenic carcinoma who has had his last chemotherapy 2 weeks ago presents to the ER because of persistent cough over the last 3 days. Admitted on 10-Jan-2023.  Subjective / 24 H Interval events - doing well this morning, breathing improved - denies chest pain, nausea/vomiting or abdominal pain  - eating well   Assessment/Plan: Principal Problem:   Postobstructive pneumonia Active Problems:   Essential hypertension   Bronchogenic carcinoma of right lung   Anemia of chronic disease   Pneumonia   Renal failure (ARF), acute on chronic   HCAP  - most likely postobstructive pneumonia in the known right upper lobe, however with new consolidation in the right lower lobe - continue Zyvox and Zosyn - febrile last night to 102.5, fever curve improved this morning - influenza negative  - Patient had reaction to vancomycin in the past with renal failure  - if cultures negative for 48h and afebrile for 24h will consider narrowing antibiotics to Levofloxacin or Augemtin  Acute on chronic kidney disease stage III  - patient's creatinine was increased from baseline of 1.5 to 1.88 on admission - Creatinine 1.54 this morning, close to baseline   Chronic anemia  - follow CBC. 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   Diet: Diet Heart Room service appropriate?: Yes; Fluid consistency:: Thin Fluids: none  DVT Prophylaxis: Lovenox  Code Status: Full Code Family Communication: no family bedside  Disposition Plan: remain inpatient, home when ready  Consultants:  None    Procedures:  None    Antibiotics Zyvox 2023/01/10 >> Zosyn Jan 10, 2023 >>   Studies  1. Ct Chest Wo Contrast 01/10/2015  New area consolidation in the right lower lobe compatible with pneumonia.  Stable central obstructing right upper lobe mass with right upper lobe collapse. Stable subcarinal adenopathy.   Objective  Filed Vitals:   01-10-2015 1440 10-Jan-2015 1854 01/10/2015 2125 12/27/14 0552  BP: 128/76  127/78 122/75  Pulse: 90  87 87  Temp: 100 F (37.8 C)  99.8 F (37.7 C) 99.9 F (37.7 C)  TempSrc: Oral  Oral Oral  Resp: '17  16 16  '$ Height: 6' (1.829 m) '6\' 2"'$  (1.88 m)    Weight: 78.926 kg (174 lb) 75.524 kg (166 lb 8 oz)    SpO2: 100%  98% 98%    Intake/Output Summary (Last 24 hours) at 12/27/14 0729 Last data filed at 12/27/14 0600  Gross per 24 hour  Intake   1650 ml  Output    152 ml  Net   1498 ml   Filed Weights   01/10/15 1440 01-10-2015 1854  Weight: 78.926 kg (174 lb) 75.524 kg (166 lb 8 oz)   Exam:  General:  NAD  HEENT: no scleral icterus  Cardiovascular: RRR without MRG, 2+ peripheral pulses, no edema  Respiratory: decreased breath sounds on right, rales present on right, no wheezing  Abdomen: soft, non tender, BS +, no guarding  MSK/Extremities: no clubbing/cyanosis, no joint swelling  Skin: no rashes  Neuro: non focal, strength 5/5 in all 4   Data Reviewed: Basic Metabolic Panel:  Recent  Labs Lab 12/23/14 0947 12/26/14 0438 12/26/14 0442 12/27/14 0403  NA 139 134* 134* 136  K 4.3 3.9 3.8 4.0  CL  --  104 102 106  CO2 20* '23 22 22  '$ GLUCOSE 117 134* 131* 98  BUN 15.'8 22 23 15  '$ CREATININE 1.5* 1.85* 1.88* 1.54*  CALCIUM 9.7 9.2 9.2 8.9   Liver Function Tests:  Recent Labs Lab 12/23/14 0947 12/26/14 0442 12/27/14 0403  AST '15 22 20  '$ ALT '16 16 20  '$ ALKPHOS 82 78 67  BILITOT <0.20 0.2* 0.7  PROT 9.2* 9.8* 8.4*  ALBUMIN 3.4* 3.6 2.7*   CBC:  Recent Labs Lab 12/23/14 0946 12/26/14 0438 12/27/14 0403  WBC 10.4* 10.8* 13.3*   NEUTROABS 8.0* 7.7  --   HGB 10.7* 10.6* 9.4*  HCT 32.1* 32.3* 28.7*  MCV 103.2* 104.2* 102.9*  PLT 171 185 171   Scheduled Meds: . amLODipine  10 mg Oral Daily  . budesonide-formoterol  2 puff Inhalation BID  . enoxaparin (LOVENOX) injection  40 mg Subcutaneous Q24H  . ipratropium-albuterol  3 mL Nebulization TID  . linezolid  600 mg Intravenous Q12H  . piperacillin-tazobactam (ZOSYN)  IV  3.375 g Intravenous Q8H   Continuous Infusions: . sodium chloride 75 mL/hr at 12/26/14 1959    Marzetta Board, MD [facility] Pager [phone]. If 7 PM - 7 AM, please contact night-coverage at [email or web address], password [facility] 12/27/2014, 7:29 AM  LOS: 1 day

## 2014-12-27 NOTE — Evaluation (Signed)
Physical Therapy Evaluation Patient Details Name: Stephen Ellis MRN: [id number] DOB: [birth year] Today's Date: 12/27/2014   History of Present Illness  69 yo male admitted with postobstructive pneumonia. Hx of bronchogenic carcinoma, syncope, blind L eye.   Clinical Impression  On eval, pt required Min assist for mobility-able to ambulate ~140 feet with use of RW. Attempted ambulation with cane (pt reports he uses this at baseline) but pt was very unsteady. Recommend HHPT with 24 hour supervision/assist and RW.     Follow Up Recommendations Home health PT; 24 hour supervision/assist    Equipment Recommendations  Rolling walker with 5" wheels    Recommendations for Other Services OT consult     Precautions / Restrictions Precautions Precautions: Fall Restrictions Weight Bearing Restrictions: No      Mobility  Bed Mobility Overal bed mobility: Needs Assistance Bed Mobility: Supine to Sit     Supine to sit: Supervision     General bed mobility comments: supervision for safety, lines  Transfers Overall transfer level: Needs assistance Equipment used: Straight cane;Rolling walker (2 wheeled) Transfers: Sit to/from [facility] Sit to Stand: Min assist Stand pivot transfers: Min assist       General transfer comment: Assist to rise, stabilize. Very unsteady with standing with cane and attempts at first few steps so switched to walker. Stand pivot from bed to recliner wtih RW  Ambulation/Gait Ambulation/Gait assistance: Min assist Ambulation Distance (Feet): 140 Feet Assistive device: Rolling walker (2 wheeled) Gait Pattern/deviations: Step-through pattern;Wide base of support;Decreased stride length;Shuffle     General Gait Details: Shuffling with initial steps and with turning/changing direction. Min assist to stabilize and maneuver wirh RW. Pt tolerated distance well.   Stairs            Wheelchair Mobility    Modified Rankin (Stroke  Patients Only)       Balance Overall balance assessment: Needs assistance         Standing balance support: Bilateral upper extremity supported;During functional activity Standing balance-Leahy Scale: Poor                               Pertinent Vitals/Pain Pain Assessment: No/denies pain    Home Living Family/patient expects to be discharged to:: Private residence Living Arrangements: Other relatives Available Help at Discharge: Available 24 hours/day (per pt report)           Home Equipment: Kasandra Knudsen - single point      Prior Function Level of Independence: Needs assistance   Gait / Transfers Assistance Needed: pt reports he uses cane for ambulation (cane in room) and that he receives assistance for ADLS/mobility-unsure if this is accurate           Hand Dominance        Extremity/Trunk Assessment   Upper Extremity Assessment: Overall WFL for tasks assessed           Lower Extremity Assessment: Generalized weakness      Cervical / Trunk Assessment: Normal  Communication   Communication: No difficulties  Cognition Arousal/Alertness: Awake/alert Behavior During Therapy: WFL for tasks assessed/performed Overall Cognitive Status: History of cognitive impairments - at baseline                      General Comments      Exercises        Assessment/Plan    PT Assessment Patient needs continued PT services  PT  Diagnosis Difficulty walking;Generalized weakness   PT Problem List Decreased strength;Decreased activity tolerance;Decreased balance;Decreased mobility;Decreased knowledge of use of DME;Decreased cognition  PT Treatment Interventions DME instruction;Gait training;Functional mobility training;Therapeutic activities;Therapeutic exercise;Patient/family education;Balance training   PT Goals (Current goals can be found in the Care Plan section) Acute Rehab PT Goals Patient Stated Goal: none stated PT Goal Formulation:  Patient unable to participate in goal setting Time For Goal Achievement: 01/17/15    Frequency Min 3X/week   Barriers to discharge        Co-evaluation               End of Session Equipment Utilized During Treatment: Gait belt Activity Tolerance: Patient tolerated treatment well Patient left: in chair;with call bell/phone within reach;with chair alarm set           Time: 9983-3825 PT Time Calculation (min) (ACUTE ONLY): 20 min   Charges:   PT Evaluation $Initial PT Evaluation Tier I: 1 Procedure     PT G Codes:        Weston Anna, MPT Pager: [phone]

## 2014-12-28 DIAGNOSIS — C3491 Malignant neoplasm of unspecified part of right bronchus or lung: Secondary | ICD-10-CM

## 2014-12-28 LAB — CBC
HEMATOCRIT: 29.2 % — AB (ref 39.0–52.0)
HEMOGLOBIN: 9.8 g/dL — AB (ref 13.0–17.0)
MCH: 33.9 pg (ref 26.0–34.0)
MCHC: 33.6 g/dL (ref 30.0–36.0)
MCV: 101 fL — ABNORMAL HIGH (ref 78.0–100.0)
Platelets: 166 10*3/uL (ref 150–400)
RBC: 2.89 MIL/uL — ABNORMAL LOW (ref 4.22–5.81)
RDW: 14.9 % (ref 11.5–15.5)
WBC: 13.1 10*3/uL — ABNORMAL HIGH (ref 4.0–10.5)

## 2014-12-28 LAB — BASIC METABOLIC PANEL
ANION GAP: 9 (ref 5–15)
BUN: 11 mg/dL (ref 6–23)
CO2: 22 mmol/L (ref 19–32)
CREATININE: 1.38 mg/dL — AB (ref 0.50–1.35)
Calcium: 9.1 mg/dL (ref 8.4–10.5)
Chloride: 101 mmol/L (ref 96–112)
GFR calc Af Amer: 60 mL/min — ABNORMAL LOW (ref >90)
GFR, EST NON AFRICAN AMERICAN: 51 mL/min — AB (ref >90)
GLUCOSE: 104 mg/dL — AB (ref 70–99)
Potassium: 3.8 mmol/L (ref 3.5–5.1)
SODIUM: 132 mmol/L — AB (ref 135–145)

## 2014-12-28 NOTE — Progress Notes (Signed)
[facility] PROGRESS NOTE Interim History: 69 year old male with past medical history of oxygen a carcinoma status post 2 week of chemotherapy presents with postobstructive pneumonia. Started on linezolid and Zosyn.   Assessment/Plan: Postobstructive pneumonia/healthcare associated pneumonia: - Started on Zyvox and Zosyn, temperature has been slowly improving, his influenza PCR was negative. - If cultures continue to be negative in the next 24 hours antibiotic regimen can be D escalated. - leukocytosis continues to have around 13. We'll continue to monitor.  Acute on chronic kidney disease: - No resolved.due to pre-renal azotemia. - Baseline creatinine 1.5.  Macrocytic anemia: - There was a mild drop in hemoglobin. We'll continue to monitor.  Essential hypertension: - No changes were made to his medication.  COPD: - Currently stable.  Bronchogenic Carcinoma and plasma cell dyscrasia: - Follow-up with oncology as an outpatient. His chemotherapy 2 weeks ago.  Code Status: Full Code Family Communication: no family bedside  Disposition Plan: remain inpatient, home when ready  Consultants:  None  Procedures:  None   Antibiotics Zyvox 01/18/2023 >> Zosyn 01-18-23 >>   Studies  1. Ct Chest Wo Contrast 01/18/2015 New area consolidation in the right lower lobe compatible with pneumonia. Stable central obstructing right upper lobe mass with right upper lobe collapse. Stable subcarinal adenopathy.   HPI/Subjective: No complaints.  Objective: Filed Vitals:   12/27/14 1430 12/27/14 2256 12/28/14 0515 12/28/14 0902  BP: 113/80  126/83   Pulse: 86  91   Temp: 98.6 F (37 C)  100 F (37.8 C)   TempSrc: Oral  Oral   Resp: 18  16   Height:      Weight:      SpO2: 96% 96% 99% 98%    Intake/Output Summary (Last 24 hours) at 12/28/14 1113 Last data filed at 12/28/14 0515  Gross per 24 hour  Intake    710 ml  Output   1700 ml  Net   -990 ml   Filed Weights     Jan 18, 2015 1440 18-Jan-2015 1854 12/27/14 0552  Weight: 78.926 kg (174 lb) 75.524 kg (166 lb 8 oz) 77.4 kg (170 lb 10.2 oz)    Exam:  General: Alert, awake, oriented x3, in no acute distress.  HEENT: No bruits, no goiter.  Heart: Regular rate and rhythm. Lungs: Good air movement, clear Abdomen: Soft, nontender, nondistended, positive bowel sounds.     Data Reviewed: Basic Metabolic Panel:  Recent Labs Lab 12/23/14 0947 01/18/15 0438 2015-01-18 0442 12/27/14 0403 12/28/14 0518  NA 139 134* 134* 136 132*  K 4.3 3.9 3.8 4.0 3.8  CL  --  104 102 106 101  CO2 20* '23 22 22 22  '$ GLUCOSE 117 134* 131* 98 104*  BUN 15.'8 22 23 15 11  '$ CREATININE 1.5* 1.85* 1.88* 1.54* 1.38*  CALCIUM 9.7 9.2 9.2 8.9 9.1   Liver Function Tests:  Recent Labs Lab 12/23/14 0947 01-18-15 0442 12/27/14 0403  AST '15 22 20  '$ ALT '16 16 20  '$ ALKPHOS 82 78 67  BILITOT <0.20 0.2* 0.7  PROT 9.2* 9.8* 8.4*  ALBUMIN 3.4* 3.6 2.7*   No results for input(s): LIPASE, AMYLASE in the last 168 hours. No results for input(s): AMMONIA in the last 168 hours. CBC:  Recent Labs Lab 12/23/14 0946 01-18-2015 0438 12/27/14 0403 12/28/14 0518  WBC 10.4* 10.8* 13.3* 13.1*  NEUTROABS 8.0* 7.7  --   --   HGB 10.7* 10.6* 9.4* 9.8*  HCT 32.1* 32.3* 28.7* 29.2*  MCV 103.2* 104.2* 102.9*  101.0*  PLT 171 185 171 166   Cardiac Enzymes: No results for input(s): CKTOTAL, CKMB, CKMBINDEX, TROPONINI in the last 168 hours. BNP (last 3 results) No results for input(s): BNP in the last 8760 hours.  ProBNP (last 3 results) No results for input(s): PROBNP in the last 8760 hours.  CBG: No results for input(s): GLUCAP in the last 168 hours.  Recent Results (from the past 240 hour(s))  Culture, blood (routine x 2)     Status: None (Preliminary result)   Collection Time: 12/26/14  5:43 AM  Result Value Ref Range Status   Specimen Description BLOOD LEFT HAND  Final   Special Requests BOTTLES DRAWN AEROBIC AND ANAEROBIC 3ML   Final   Culture   Final           BLOOD CULTURE RECEIVED NO GROWTH TO DATE CULTURE WILL BE HELD FOR 5 DAYS BEFORE ISSUING A FINAL NEGATIVE REPORT Performed at [facility]    Report Status PENDING  Incomplete  Culture, blood (routine x 2)     Status: None (Preliminary result)   Collection Time: 12/26/14  5:45 AM  Result Value Ref Range Status   Specimen Description BLOOD RIGHT ANTECUBITAL  Final   Special Requests BOTTLES DRAWN AEROBIC AND ANAEROBIC 5ML  Final   Culture   Final           BLOOD CULTURE RECEIVED NO GROWTH TO DATE CULTURE WILL BE HELD FOR 5 DAYS BEFORE ISSUING A FINAL NEGATIVE REPORT Performed at [facility]    Report Status PENDING  Incomplete     Studies: No results found.  Scheduled Meds: . amLODipine  10 mg Oral Daily  . budesonide-formoterol  2 puff Inhalation BID  . enoxaparin (LOVENOX) injection  40 mg Subcutaneous Q24H  . ipratropium-albuterol  3 mL Nebulization TID  . linezolid  600 mg Intravenous Q12H  . piperacillin-tazobactam (ZOSYN)  IV  3.375 g Intravenous Q8H   Continuous Infusions:   Time Spent: 15 min   Charlynne Cousins  [facility] Pager [phone]. If 7PM-7AM, please contact night-coverage at [email or web address], password [facility] 12/28/2014, 11:13 AM  LOS: 2 days

## 2014-12-29 DIAGNOSIS — C3490 Malignant neoplasm of unspecified part of unspecified bronchus or lung: Secondary | ICD-10-CM

## 2014-12-29 DIAGNOSIS — D6481 Anemia due to antineoplastic chemotherapy: Secondary | ICD-10-CM

## 2014-12-29 LAB — CBC
HEMATOCRIT: 28.5 % — AB (ref 39.0–52.0)
Hemoglobin: 9.6 g/dL — ABNORMAL LOW (ref 13.0–17.0)
MCH: 33.7 pg (ref 26.0–34.0)
MCHC: 33.7 g/dL (ref 30.0–36.0)
MCV: 100 fL (ref 78.0–100.0)
Platelets: 163 10*3/uL (ref 150–400)
RBC: 2.85 MIL/uL — AB (ref 4.22–5.81)
RDW: 14.7 % (ref 11.5–15.5)
WBC: 10.2 10*3/uL (ref 4.0–10.5)

## 2014-12-29 MED ORDER — AMOXICILLIN-POT CLAVULANATE 875-125 MG PO TABS
1.0000 | ORAL_TABLET | Freq: Two times a day (BID) | ORAL | Status: DC
  Filled 2014-12-29 (×2): qty 1

## 2014-12-29 MED ORDER — AMOXICILLIN-POT CLAVULANATE 875-125 MG PO TABS: 1.0000 | ORAL_TABLET | Freq: Two times a day (BID) | ORAL | Status: DC

## 2014-12-29 NOTE — Progress Notes (Signed)
Stephen Ellis   DOB:02-10-1946   ZO#:[id number]   JWJ#:[id number]  Patient Care Team: Elizabeth Palau, MD as PCP - General (Family Medicine) Curt Bears, MD as Consulting Physician (Oncology)  Subjective: 69 year old man With a history of Stage IV non-small cell lung carcinoma, admitted on 12/26/2014 with 3 day history of cough,  Shortness of breath. This x-ray was suspicious for pneumonia. CT of the chest without contrast demonstrated developing pneumonia, likely postobstructive. He was placed on IV antibiotics and supportive care with good response. Cultures are currently pending. Denies fevers, chills, night sweats, vision changes, or mucositis.   Denies any chest pain or palpitations. Denies lower extremity swelling. Denies nausea, heartburn  Denies any dysuria. Denies abnormal skin rashes, or neuropathy. Denies any bleeding issues such as epistaxis, hematemesis, hematuria or hematochezia.   DIAGNOSIS:  1. 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 2) plasma cell dyscrasia diagnosed in 2013.  PRIOR THERAPY: Status post palliative radiotherapy to the right lung obstructing mass under the care of Dr. Pablo Ledger completed on 09/19/2014.  CURRENT THERAPY: Systemic chemotherapy with carboplatin for AUC of 5 and paclitaxel 175 MG/M2 every 3 weeks with Neulasta support. First cycle on 09/23/2014. Status post 4 cycles.  Scheduled Meds: . amLODipine  10 mg Oral Daily  . amoxicillin-clavulanate  1 tablet Oral Q12H  . budesonide-formoterol  2 puff Inhalation BID  . enoxaparin (LOVENOX) injection  40 mg Subcutaneous Q24H  . ipratropium-albuterol  3 mL Nebulization TID   Continuous Infusions:  PRN Meds:acetaminophen **OR** acetaminophen, albuterol, ondansetron **OR**  ondansetron (ZOFRAN) IV, prochlorperazine   Objective:  Filed Vitals:   12/29/14 0445  BP: 129/83  Pulse: 88  Temp: 98.2 F (36.8 C)  Resp: 20      Intake/Output Summary (Last 24 hours) at 12/29/14 0942 Last data filed at 12/29/14 0605  Gross per 24 hour  Intake    600 ml  Output   1175 ml  Net   -575 ml    ECOG PERFORMANCE STATUS: 2  GENERAL:alert, no distress and comfortable, Chronically ill appearing. SKIN: skin color, texture, turgor are normal, no rashes or significant lesions EYES: Left eye opacity noted, conjunctiva are pink and non-injected, sclera clear OROPHARYNX:no exudate, no erythema and lips, buccal mucosa, and tongue normal  NECK: supple, thyroid normal size, non-tender, without nodularity LYMPH:  no palpable lymphadenopathy in the cervical, axillary or inguinal LUNGS: clear to auscultation and percussion with normal breathing effort HEART: regular rate & rhythm and no murmurs and no lower extremity edema ABDOMEN: soft, non-tender and normal bowel sounds Musculoskeletal:no cyanosis of digits and no clubbing  PSYCH: alert & oriented x 3 with fluent speech NEURO: no focal motor/sensory deficits    CBG (last 3)  No results for input(s): GLUCAP in the last 72 hours.   Labs:   Recent Labs Lab 12/23/14 0946 12/26/14 0438 12/27/14 0403 12/28/14 0518 12/29/14 0450  WBC 10.4* 10.8* 13.3* 13.1* 10.2  HGB 10.7* 10.6* 9.4* 9.8* 9.6*  HCT 32.1* 32.3* 28.7* 29.2* 28.5*  PLT 171 185 171 166 163  MCV 103.2* 104.2* 102.9* 101.0* 100.0  MCH 34.4* 34.2* 33.7 33.9 33.7  MCHC 33.3 32.8 32.8 33.6 33.7  RDW 15.9* 15.6* 15.4 14.9 14.7  LYMPHSABS 0.7* 1.7  --   --   --   MONOABS 1.8* 1.4*  --   --   --  EOSABS 0.0 0.0  --   --   --   BASOSABS 0.0 0.0  --   --   --      Chemistries:    Recent Labs Lab 12/23/14 0947 12/26/14 0438 12/26/14 0442 12/27/14 0403 12/28/14 0518  NA 139 134* 134* 136 132*  K 4.3 3.9 3.8 4.0 3.8  CL  --  104 102 106 101  CO2  20* '23 22 22 22  '$ GLUCOSE 117 134* 131* 98 104*  BUN 15.'8 22 23 15 11  '$ CREATININE 1.5* 1.85* 1.88* 1.54* 1.38*  CALCIUM 9.7 9.2 9.2 8.9 9.1  AST 15  --  22 20  --   ALT 16  --  16 20  --   ALKPHOS 82  --  78 67  --   BILITOT <0.20  --  0.2* 0.7  --     GFR Estimated Creatinine Clearance: 56.9 mL/min (by C-G formula based on Cr of 1.38).  Liver Function Tests:  Recent Labs Lab 12/23/14 0947 12/26/14 0442 12/27/14 0403  AST '15 22 20  '$ ALT '16 16 20  '$ ALKPHOS 82 78 67  BILITOT <0.20 0.2* 0.7  PROT 9.2* 9.8* 8.4*  ALBUMIN 3.4* 3.6 2.7*    Cultures pending   Imaging Studies:  No results found.  Assessment/Plan: 69 y.o.  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 Pablo Ledger. He is currently being treated with carboplatin for AUC of 5 and paclitaxel 175 MG/M2 with Neulasta support every 3 weeks, status post 5 cycles, last on 12/16/14.  Recent restaging CT scan was stable overall all with some improvement in the inflammatory process in the right lower lobe. There is a small/tiny subcentimeter upper abdominal celiac axis node that showed metabolic activity on the PET scan and will be closely followed on subsequent imaging studies. Cycle 6 is on hold due to current hospitalization  Postobstructive pneumonia-healthcare associated pneumonia COPD Continue Zyvox and Zosyn as per primary team, and supportive therapy Cultures are negative to date   Anemia Due to recent chemotherapy malnutrition dilution, possible infection No transfusion is indicated at this time Monitor counts closely Transfuse blood to maintain a Hb of 8 g or if the patient is acutely bleeding  DVT prophylaxis On Lovenox  Full Code   Other medical issues Including acute on chronic renal failure and hypertension as per admitting team     **Disclaimer: This note was dictated  with voice recognition software. Similar sounding words can inadvertently be transcribed and this note may contain transcription errors which may not have been corrected upon publication of note.** Rondel Jumbo, PA-C 12/29/2014  9:42 AM  ADDENDUM: Hematology/Oncology Attending: The patient is seen and examined. I agree with the above note. This is a very pleasant 69 years old African-American male with stage IV non-small cell lung cancer who is currently undergoing systemic chemotherapy with carboplatin and paclitaxel is status post 5 cycles. The patient is tolerating his treatment fairly well with no significant adverse effects. He presented to the emergency Department complaining of cough and shortness of breath. Chest x-ray performed at that time showed questionable pneumonia. The patient was started on IV antibiotics with Zyvox and Zosyn and tolerating the treatment well. He is feeling much better. I agree with the current treatment plan and I will arrange for the patient follow-up appointment with me after discharge for reevaluation before resuming his  systemic chemotherapy. Thank you so much for taking good care of Mr. Worlds, I will continue to follow up the patient with you and assist in his management on as-needed basis.

## 2014-12-29 NOTE — Progress Notes (Signed)
ANTIBIOTIC CONSULT NOTE - follow up  Pharmacy Consult for Zosyn Indication: HCAP  No Known Allergies  Patient Measurements: Height: '6\' 2"'$  (188 cm) Weight: 170 lb 10.2 oz (77.4 kg) IBW/kg (Calculated) : 82.2 Total body weight: 80kg  Vital Signs: Temp: 98.2 F (36.8 C) (04/25 0445) Temp Source: Oral (04/25 0445) BP: 131/86 mmHg (04/25 0943) Pulse Rate: 88 (04/25 0445) Intake/Output from previous day: 04/24 0701 - 04/25 0700 In: 840 [P.O.:840] Out: 1375 [Urine:1375] Intake/Output from this shift: Total I/O In: 240 [P.O.:240] Out: 200 [Urine:200]  Labs:  Recent Labs  12/27/14 0403 12/28/14 0518 12/29/14 0450  WBC 13.3* 13.1* 10.2  HGB 9.4* 9.8* 9.6*  PLT 171 166 163  CREATININE 1.54* 1.38*  --    Estimated Creatinine Clearance: 56.9 mL/min (by C-G formula based on Cr of 1.38). No results for input(s): VANCOTROUGH, VANCOPEAK, VANCORANDOM, GENTTROUGH, GENTPEAK, GENTRANDOM, TOBRATROUGH, TOBRAPEAK, TOBRARND, AMIKACINPEAK, AMIKACINTROU, AMIKACIN in the last 72 hours.   Microbiology: Recent Results (from the past 720 hour(s))  Culture, blood (routine x 2)     Status: None (Preliminary result)   Collection Time: 12/26/14  5:43 AM  Result Value Ref Range Status   Specimen Description BLOOD LEFT HAND  Final   Special Requests BOTTLES DRAWN AEROBIC AND ANAEROBIC 3ML  Final   Culture   Final           BLOOD CULTURE RECEIVED NO GROWTH TO DATE CULTURE WILL BE HELD FOR 5 DAYS BEFORE ISSUING A FINAL NEGATIVE REPORT Performed at [facility]    Report Status PENDING  Incomplete  Culture, blood (routine x 2)     Status: None (Preliminary result)   Collection Time: 12/26/14  5:45 AM  Result Value Ref Range Status   Specimen Description BLOOD RIGHT ANTECUBITAL  Final   Special Requests BOTTLES DRAWN AEROBIC AND ANAEROBIC 5ML  Final   Culture   Final           BLOOD CULTURE RECEIVED NO GROWTH TO DATE CULTURE WILL BE HELD FOR 5 DAYS BEFORE ISSUING A FINAL NEGATIVE  REPORT Performed at [facility]    Report Status PENDING  Incomplete    Medical History: Past Medical History  Diagnosis Date  . Syncope   . Dehydration   . Blind left eye   . Hypertension   . Anemia     "low Blood"  . Dementia     poor memory  . Cancer     Lung  . Lung cancer 08/12/14    Right lung   Medications:  Scheduled:  . amLODipine  10 mg Oral Daily  . amoxicillin-clavulanate  1 tablet Oral Q12H  . budesonide-formoterol  2 puff Inhalation BID  . enoxaparin (LOVENOX) injection  40 mg Subcutaneous Q24H  . ipratropium-albuterol  3 mL Nebulization TID   Anti-infectives    Start     Dose/Rate Route Frequency Ordered Stop   12/29/14 1000  amoxicillin-clavulanate (AUGMENTIN) 875-125 MG per tablet 1 tablet     1 tablet Oral Every 12 hours 12/29/14 0852     12/29/14 0000  amoxicillin-clavulanate (AUGMENTIN) 875-125 MG per tablet     1 tablet Oral Every 12 hours 12/29/14 0859     12/27/14 0600  ceFEPIme (MAXIPIME) 2 g in dextrose 5 % 50 mL IVPB  Status:  Discontinued     2 g 100 mL/hr over 30 Minutes Intravenous Every 24 hours 12/26/14 0825 12/26/14 1653   12/26/14 1800  linezolid (ZYVOX) IVPB 600 mg  Status:  Discontinued     600 mg 300 mL/hr over 60 Minutes Intravenous Every 12 hours 12/26/14 1653 12/29/14 0852   12/26/14 1800  piperacillin-tazobactam (ZOSYN) IVPB 3.375 g  Status:  Discontinued     3.375 g 12.5 mL/hr over 240 Minutes Intravenous Every 8 hours 12/26/14 1707 12/29/14 0852   12/26/14 0545  vancomycin (VANCOCIN) 1,500 mg in sodium chloride 0.9 % 500 mL IVPB  Status:  Discontinued     1,500 mg 250 mL/hr over 120 Minutes Intravenous  Once 12/26/14 0526 12/26/14 0540   12/26/14 0545  linezolid (ZYVOX) IVPB 600 mg     600 mg 300 mL/hr over 60 Minutes Intravenous  Once 12/26/14 0541 12/26/14 0749   12/26/14 0530  ceFEPIme (MAXIPIME) 2 g in dextrose 5 % 50 mL IVPB     2 g 100 mL/hr over 30 Minutes Intravenous  Once 12/26/14 0526 12/26/14 0628    12/26/14 0500  azithromycin (ZITHROMAX) tablet 500 mg     500 mg Oral  Once 12/26/14 0458 12/26/14 0515     Assessment: 89 yoM to ED with SHOB/cough of 3 days duration. Hx of Lung Ca, C5 Carbo/Taxol completed 4/12 with Neulasta support 4/14. Treat for HCAP with Zyvox, and Cefepime initially. Hx of elevated SCr with Vancomycin last admit. Cefepime changed to Zosyn, pharmacy to dose.  4/22 >> cefepime >> 4/22 4/22 >> Zosyn >> 4/22 >> Zyvox (ID approved) >>  Afebrile SCr stable WBC improved Plts stable  4/22 blood: ngtd 4/22 flu panel: neg  Goal of Therapy:  Antibiotic appropriate for treatment, renal function  Plan: Day 4 Abx's  Continue Zosyn 3.375gm q8h - 4 hr infusion  Continuing Zyvox '600mg'$  IV q12 per Md  Monitor cultures, renal function  Per Md, possible discharge after PT evaluation   Adrian Saran, PharmD, BCPS Pager [phone] 12/29/2014 10:57 AM

## 2014-12-29 NOTE — Progress Notes (Signed)
[facility] PROGRESS NOTE Interim History: 69 year old male with past medical history of oxygen a carcinoma status post 2 week of chemotherapy presents with postobstructive pneumonia. Started on linezolid and Zosyn.   Assessment/Plan: Postobstructive pneumonia/healthcare associated pneumonia: - Started on Zyvox and Zosyn, temperature has been slowly improving, his influenza PCR was negative. - If cultures continue to be negative in the next 24 hours antibiotic regimen can be D escalated. - White count trended down to 10.2.  -Patient remaining afebrile for the past 24 hours, we'll discontinue Zosyn and transitioned to oral Augmentin today.  Acute on chronic kidney disease: -Likely secondary to prerenal azotemia -Patient's creatinine trended down to 1.38 from 1.54 -Repeat BMP in a.m.  Macrocytic anemia: - There was a mild drop in hemoglobin. We'll continue to monitor.  Essential hypertension: - No changes were made to his medication.  COPD: - Currently stable.  Bronchogenic Carcinoma and plasma cell dyscrasia: - Follow-up with oncology as an outpatient. His chemotherapy 2 weeks ago.  Deconditioning. -Physical therapy recommending home health services with PT -Face-to-face completed  Code Status: Full Code Family Communication: no family bedside  Disposition Plan: remain inpatient, home when ready  Consultants:  None  Procedures:  None   Antibiotics Zyvox Jan 11, 2023 >> Zosyn 01/11/2023 >>   Studies  1. Ct Chest Wo Contrast 2015-01-11 New area consolidation in the right lower lobe compatible with pneumonia. Stable central obstructing right upper lobe mass with right upper lobe collapse. Stable subcarinal adenopathy.   HPI/Subjective: No complaints, she states feeling better.   Objective: Filed Vitals:   12/29/14 0800 12/29/14 0943 12/29/14 1402 12/29/14 1529  BP:  131/86 118/20   Pulse:   88   Temp:   97.8 F (36.6 C)   TempSrc:   Oral   Resp:   20    Height:      Weight:      SpO2: 98%  99% 97%    Intake/Output Summary (Last 24 hours) at 12/29/14 1852 Last data filed at 12/29/14 1553  Gross per 24 hour  Intake    360 ml  Output   1325 ml  Net   -965 ml   Filed Weights   01-11-2015 1440 Jan 11, 2015 1854 12/27/14 0552  Weight: 78.926 kg (174 lb) 75.524 kg (166 lb 8 oz) 77.4 kg (170 lb 10.2 oz)    Exam:  General: Alert, awake, oriented x3, in no acute distress.  HEENT: No bruits, no goiter.  Heart: Regular rate and rhythm. Lungs: Good air movement, clear Abdomen: Soft, nontender, nondistended, positive bowel sounds.     Data Reviewed: Basic Metabolic Panel:  Recent Labs Lab 12/23/14 0947 2015-01-11 0438 Jan 11, 2015 0442 12/27/14 0403 12/28/14 0518  NA 139 134* 134* 136 132*  K 4.3 3.9 3.8 4.0 3.8  CL  --  104 102 106 101  CO2 20* '23 22 22 22  '$ GLUCOSE 117 134* 131* 98 104*  BUN 15.'8 22 23 15 11  '$ CREATININE 1.5* 1.85* 1.88* 1.54* 1.38*  CALCIUM 9.7 9.2 9.2 8.9 9.1   Liver Function Tests:  Recent Labs Lab 12/23/14 0947 Jan 11, 2015 0442 12/27/14 0403  AST '15 22 20  '$ ALT '16 16 20  '$ ALKPHOS 82 78 67  BILITOT <0.20 0.2* 0.7  PROT 9.2* 9.8* 8.4*  ALBUMIN 3.4* 3.6 2.7*   No results for input(s): LIPASE, AMYLASE in the last 168 hours. No results for input(s): AMMONIA in the last 168 hours. CBC:  Recent Labs Lab 12/23/14 0946 2015/01/11 0438 12/27/14 0403 12/28/14  0518 12/29/14 0450  WBC 10.4* 10.8* 13.3* 13.1* 10.2  NEUTROABS 8.0* 7.7  --   --   --   HGB 10.7* 10.6* 9.4* 9.8* 9.6*  HCT 32.1* 32.3* 28.7* 29.2* 28.5*  MCV 103.2* 104.2* 102.9* 101.0* 100.0  PLT 171 185 171 166 163   Cardiac Enzymes: No results for input(s): CKTOTAL, CKMB, CKMBINDEX, TROPONINI in the last 168 hours. BNP (last 3 results) No results for input(s): BNP in the last 8760 hours.  ProBNP (last 3 results) No results for input(s): PROBNP in the last 8760 hours.  CBG: No results for input(s): GLUCAP in the last 168 hours.  Recent  Results (from the past 240 hour(s))  Culture, blood (routine x 2)     Status: None (Preliminary result)   Collection Time: 12/26/14  5:43 AM  Result Value Ref Range Status   Specimen Description BLOOD LEFT HAND  Final   Special Requests BOTTLES DRAWN AEROBIC AND ANAEROBIC 3ML  Final   Culture   Final           BLOOD CULTURE RECEIVED NO GROWTH TO DATE CULTURE WILL BE HELD FOR 5 DAYS BEFORE ISSUING A FINAL NEGATIVE REPORT Performed at [facility]    Report Status PENDING  Incomplete  Culture, blood (routine x 2)     Status: None (Preliminary result)   Collection Time: 12/26/14  5:45 AM  Result Value Ref Range Status   Specimen Description BLOOD RIGHT ANTECUBITAL  Final   Special Requests BOTTLES DRAWN AEROBIC AND ANAEROBIC 5ML  Final   Culture   Final           BLOOD CULTURE RECEIVED NO GROWTH TO DATE CULTURE WILL BE HELD FOR 5 DAYS BEFORE ISSUING A FINAL NEGATIVE REPORT Performed at [facility]    Report Status PENDING  Incomplete     Studies: No results found.  Scheduled Meds: . amLODipine  10 mg Oral Daily  . amoxicillin-clavulanate  1 tablet Oral Q12H  . budesonide-formoterol  2 puff Inhalation BID  . enoxaparin (LOVENOX) injection  40 mg Subcutaneous Q24H  . ipratropium-albuterol  3 mL Nebulization TID   Continuous Infusions:   Time Spent: 15 min   Torrey Ballinas  [facility] Pager [phone]. If 7PM-7AM, please contact night-coverage at [email or web address], password [facility] 12/29/2014, 6:52 PM  LOS: 3 days

## 2014-12-29 NOTE — Progress Notes (Signed)
Pt with order to d/c to home. No IV present.  Pt in stable condition. Discharge instructions gone over with pt and pt's brother, Nicole Kindred. Pt d/c'd to home via brother Nicole Kindred.

## 2014-12-29 NOTE — Progress Notes (Signed)
Physical Therapy Treatment Patient Details Name: Stephen Ellis MRN: [id number] DOB: [birth year] Today's Date: 12/29/2014    History of Present Illness 69 yo male admitted with postobstructive pneumonia. Hx of bronchogenic carcinoma, syncope, blind L eye.     PT Comments    Patient is very unsteady with poor gait using RW, required Mod assist. Patient is unable to state who he is going to assit him nor where he plans to go. Patient is a risk for fallls given gait  Today.  Follow Up Recommendations  Home health PT (needs 24/7 caregivers and 1:1 assist for ambulation. high fall risk.)     Equipment Recommendations  Rolling walker with 5" wheels (a 4 wheeled may be better.)    Recommendations for Other Services       Precautions / Restrictions Precautions Precautions: Fall Restrictions Weight Bearing Restrictions: No    Mobility  Bed Mobility Overal bed mobility: Needs Assistance Bed Mobility: Supine to Sit;Sit to Supine     Supine to sit: Supervision Sit to supine: Min assist   General bed mobility comments: assist with legs onto bed. noted decreased control of  L leg, tends to have extensor tone as evidenced during  sliding up in the bed,  during strong coughing./  Transfers Overall transfer level: Needs assistance Equipment used: Rolling walker (2 wheeled) Transfers: Sit to/from Stand Sit to Stand: Min assist         General transfer comment: Assist to rise, stabilize. Very unsteady with standing , L leg tends to extend out/thrust out.  Ambulation/Gait Ambulation/Gait assistance: Min assist;Mod assist Ambulation Distance (Feet): 400 Feet Assistive device: Rolling walker (2 wheeled) Gait Pattern/deviations: Step-to pattern;Decreased stance time - left;Decreased step length - left;Staggering left;Staggering right;Drifts right/left     General Gait Details: patient required frequent assist to guide the RW in forward direction, veering to the R constantly.  Decreased advancement of L leg, tends to drag it and catches on the RW leg   Stairs            Wheelchair Mobility    Modified Rankin (Stroke Patients Only)       Balance           Standing balance support: Bilateral upper extremity supported;During functional activity Standing balance-Leahy Scale: Poor                      Cognition Arousal/Alertness: Awake/alert   Overall Cognitive Status: History of cognitive impairments - at baseline                      Exercises      General Comments        Pertinent Vitals/Pain Pain Assessment: No/denies pain    Home Living                      Prior Function            PT Goals (current goals can now be found in the care plan section) Progress towards PT goals: Progressing toward goals    Frequency       PT Plan Current plan remains appropriate    Co-evaluation             End of Session Equipment Utilized During Treatment: Gait belt Activity Tolerance: Patient tolerated treatment well Patient left: in bed;with call bell/phone within reach;with bed alarm set     Time: 3790-2409 PT Time Calculation (min) (ACUTE ONLY): 23 min  Charges:  $Gait Training: 23-37 mins                    G Codes:      Claretha Cooper 12/29/2014, 1:31 PM Tresa Endo PT [phone]

## 2014-12-29 NOTE — Care Management Note (Signed)
CARE MANAGEMENT NOTE 12/29/2014  Patient:  Stephen Ellis, Stephen Ellis   Account Number:  [id number]  Date Initiated:  12/26/2014  Documentation initiated by:  DAVIS,RHONDA  Subjective/Objective Assessment:   pna in a immocomprised patient -recent chemo, lung ca     Action/Plan:   home when stable   Anticipated DC Date:  12/29/2014   Anticipated DC Plan:  [place]  In-house referral  NA      DC Planning Services  CM consult      [facility] Choice  HOME HEALTH   Choice offered to / List presented to:  C-1 Patient   DME arranged  3-N-1  Vassie Moselle      DME agency  [place] arranged  HH-2 PT  HH-1 RN  HH-3 OT      [place].   Status of service:  In process, will continue to follow Medicare Important Message given?  YES (If response is "NO", the following Medicare IM given date fields will be blank) Date Medicare IM given:  12/29/2014 Medicare IM given by:  Marney Doctor Date Additional Medicare IM given:   Additional Medicare IM given by:    Discharge Disposition:    Per UR Regulation:  Reviewed for med. necessity/level of care/duration of stay  If discussed at [place] of Stay Meetings, dates discussed:    Comments:  12/29/14 Marney Doctor RN,BSN,NCM [phone] PT is recommending HHPT and 24hr supervision. Pt is from home with Nephew. Nephew works but [employer] sister will stay with pt while he works. Pt states he has used [facility] in the past and would like to use them again. Referral for [facility] services given to [facility] rep. Nephew is requesting 3 in 1 and RW. Orders for DME faxed to [place]. No other DC needs noted.  December 26, 2014/Rhonda L. Rosana Hoes, RN, BSN, CCM. Case Management [place] [phone] No discharge needs present of time of review.

## 2014-12-29 NOTE — Discharge Summary (Signed)
Physician Discharge Summary  Stephen Ellis GYJ:[id number] DOB: [birth year] DOA: 12/26/2014  PCP: Elizabeth Palau, MD  Admit date: 12/26/2014 Discharge date: 12/29/2014  Time spent: 35 minutes  Recommendations for Outpatient Follow-up:  1. Please follow up on respiratory status, he was treated for postobstructive PNA during this hospitalization.  2. Prior to discharge, [facility] were set up   Discharge Diagnoses:  Principal Problem:   Postobstructive pneumonia Active Problems:   Essential hypertension   Bronchogenic carcinoma of right lung   Anemia of chronic disease   Pneumonia   Renal failure (ARF), acute on chronic   Discharge Condition: Stable  Diet recommendation: Heart healthy  Filed Weights   12/26/14 1440 12/26/14 1854 12/27/14 0552  Weight: 78.926 kg (174 lb) 75.524 kg (166 lb 8 oz) 77.4 kg (170 lb 10.2 oz)    History of present illness:  Stephen Ellis is a 69 y.o. male with history of bronchogenic carcinoma who has had his last chemotherapy 2 weeks ago presents to the ER because of persistent cough over the last 3 days. When patient has coffee also get short of breath. Denies any production of sputum. Denies any chest pain. Denies any fever or chills. In the ER patient had a CT chest done without contrast which shows developing pneumonia. Patient has been admitted for IV antibiotics. On exam patient is not in distress. Patient during last admission had elevated creatinine from vancomycin so patient was started on Zyvox and cefepime. Patient at this time is afebrile. Pneumonia is most likely postobstructive.   Hospital Course:  Patient is a pleasant 69 year old gentleman with a past medical history of bronchogenic carcinoma currently undergoing chemotherapy at the [facility], admitted to medicine service on 12/26/2014 when he presented to the emergency room with complaints of cough. He was worked up with a CT scan of chest without contrast  which showed new area consolidation in the right lower lobe compatible with pneumonia. Patient was treated for presumed postobstructive pneumonia with IV Zosyn. Blood cultures drawn on 12/26/2014 show no growth to date. Overall lab work showed improvement with white count trended down to 10,200 by 12/29/2014. His renal function also improved with creatinine trending down to 1.38 from 1.88. During this hospitalization patient was seen by medical oncology. Patient was also seen and evaluated by physical therapy who recommended home health services. Case manager consulted to set patient up with home health PT.    Consultations:  Case manager  Discharge Exam: Filed Vitals:   12/29/14 0445  BP: 129/83  Pulse: 88  Temp: 98.2 F (36.8 C)  Resp: 20    General: Patient is in no acute distress, sitting up states breathing easier. Cardiovascular: Regular rate rhythm normal S1-S2 no murmurs rubs gallops Respiratory: Normal respiratory effort  Discharge Instructions   Discharge Instructions    Call MD for:  difficulty breathing, headache or visual disturbances    Complete by:  As directed      Call MD for:  extreme fatigue    Complete by:  As directed      Call MD for:  hives    Complete by:  As directed      Call MD for:  persistant dizziness or light-headedness    Complete by:  As directed      Call MD for:  persistant nausea and vomiting    Complete by:  As directed      Call MD for:  redness, tenderness, or signs of infection (  pain, swelling, redness, odor or green/yellow discharge around incision site)    Complete by:  As directed      Call MD for:  severe uncontrolled pain    Complete by:  As directed      Call MD for:  temperature >100.4    Complete by:  As directed      Diet - low sodium heart healthy    Complete by:  As directed      Increase activity slowly    Complete by:  As directed           Current Discharge Medication List    START taking these medications    Details  amoxicillin-clavulanate (AUGMENTIN) 875-125 MG per tablet Take 1 tablet by mouth every 12 (twelve) hours. Qty: 14 tablet, Refills: 0      CONTINUE these medications which have NOT CHANGED   Details  acetaminophen-codeine (TYLENOL #3) 300-30 MG per tablet Take 1 tablet by mouth every 4 (four) hours as needed for moderate pain.    albuterol (PROVENTIL) (2.5 MG/3ML) 0.083% nebulizer solution Take 3 mLs (2.5 mg total) by nebulization every 6 (six) hours as needed for wheezing or shortness of breath. Qty: 75 mL, Refills: 0    amLODipine (NORVASC) 10 MG tablet Take 1 tablet (10 mg total) by mouth daily. Qty: 30 tablet, Refills: 2    budesonide-formoterol (SYMBICORT) 160-4.5 MCG/ACT inhaler Inhale 2 puffs into the lungs 2 (two) times daily. Qty: 1 Inhaler, Refills: 12    ipratropium-albuterol (DUONEB) 0.5-2.5 (3) MG/3ML SOLN Take 3 mLs by nebulization every 6 (six) hours. Qty: 360 mL, Refills: 1    prochlorperazine (COMPAZINE) 10 MG tablet Take 1 tablet (10 mg total) by mouth every 6 (six) hours as needed for nausea or vomiting. Qty: 30 tablet, Refills: 0       No Known Allergies Follow-up Information    Follow up with Elizabeth Palau, MD In 1 week.   Specialty:  Family Medicine   Contact information:   [address] [place] [address] [phone]        The results of significant diagnostics from this hospitalization (including imaging, microbiology, ancillary and laboratory) are listed below for reference.    Significant Diagnostic Studies: Ct Chest Wo Contrast  12/26/2014   CLINICAL DATA:  Nonproductive cough for 3 days. Ongoing chemotherapy and radiation for right lung cancer.  EXAM: CT CHEST WITHOUT CONTRAST  TECHNIQUE: Multidetector CT imaging of the chest was performed following the standard protocol without IV contrast.  COMPARISON:  11/21/2014  FINDINGS: CT CHEST FINDINGS  Mediastinum/Nodes: Heart is normal size. Aorta is normal caliber.  1.4 cm subcarinal lymph node is stable. Other small scattered subcentimeric lymph nodes in the mediastinum. No axillary adenopathy. No visible hilar adenopathy on this unenhanced study.  Lungs/Pleura: Continued postobstructive collapse of the right upper lobe. Obstructing central right upper lobe mass again noted, grossly unchanged, difficult to measure exactly will without intravenous contrast and due to the adjacent collapsed right upper lobe. New area of consolidation in the right lower lobe compatible with pneumonia. No pleural effusions.  Musculoskeletal: Chest wall soft tissues are unremarkable. No acute bony abnormality or focal bone lesion.  Upper abdomen: Imaging into the upper abdomen shows no acute findings.  IMPRESSION: New area consolidation in the right lower lobe compatible with pneumonia.  Stable central obstructing right upper lobe mass with right upper lobe collapse. Stable subcarinal adenopathy.   Electronically Signed   By: Rolm Baptise  M.D.   On: 12/26/2014 04:55    Microbiology: Recent Results (from the past 240 hour(s))  Culture, blood (routine x 2)     Status: None (Preliminary result)   Collection Time: 12/26/14  5:43 AM  Result Value Ref Range Status   Specimen Description BLOOD LEFT HAND  Final   Special Requests BOTTLES DRAWN AEROBIC AND ANAEROBIC 3ML  Final   Culture   Final           BLOOD CULTURE RECEIVED NO GROWTH TO DATE CULTURE WILL BE HELD FOR 5 DAYS BEFORE ISSUING A FINAL NEGATIVE REPORT Performed at [facility]    Report Status PENDING  Incomplete  Culture, blood (routine x 2)     Status: None (Preliminary result)   Collection Time: 12/26/14  5:45 AM  Result Value Ref Range Status   Specimen Description BLOOD RIGHT ANTECUBITAL  Final   Special Requests BOTTLES DRAWN AEROBIC AND ANAEROBIC 5ML  Final   Culture   Final           BLOOD CULTURE RECEIVED NO GROWTH TO DATE CULTURE WILL BE HELD FOR 5 DAYS BEFORE ISSUING A FINAL NEGATIVE REPORT Performed at  [facility]    Report Status PENDING  Incomplete     Labs: Basic Metabolic Panel:  Recent Labs Lab 12/23/14 0947 12/26/14 0438 12/26/14 0442 12/27/14 0403 12/28/14 0518  NA 139 134* 134* 136 132*  K 4.3 3.9 3.8 4.0 3.8  CL  --  104 102 106 101  CO2 20* '23 22 22 22  '$ GLUCOSE 117 134* 131* 98 104*  BUN 15.'8 22 23 15 11  '$ CREATININE 1.5* 1.85* 1.88* 1.54* 1.38*  CALCIUM 9.7 9.2 9.2 8.9 9.1   Liver Function Tests:  Recent Labs Lab 12/23/14 0947 12/26/14 0442 12/27/14 0403  AST '15 22 20  '$ ALT '16 16 20  '$ ALKPHOS 82 78 67  BILITOT <0.20 0.2* 0.7  PROT 9.2* 9.8* 8.4*  ALBUMIN 3.4* 3.6 2.7*   No results for input(s): LIPASE, AMYLASE in the last 168 hours. No results for input(s): AMMONIA in the last 168 hours. CBC:  Recent Labs Lab 12/23/14 0946 12/26/14 0438 12/27/14 0403 12/28/14 0518 12/29/14 0450  WBC 10.4* 10.8* 13.3* 13.1* 10.2  NEUTROABS 8.0* 7.7  --   --   --   HGB 10.7* 10.6* 9.4* 9.8* 9.6*  HCT 32.1* 32.3* 28.7* 29.2* 28.5*  MCV 103.2* 104.2* 102.9* 101.0* 100.0  PLT 171 185 171 166 163   Cardiac Enzymes: No results for input(s): CKTOTAL, CKMB, CKMBINDEX, TROPONINI in the last 168 hours. BNP: BNP (last 3 results) No results for input(s): BNP in the last 8760 hours.  ProBNP (last 3 results) No results for input(s): PROBNP in the last 8760 hours.  CBG: No results for input(s): GLUCAP in the last 168 hours.     SignedKelvin Cellar  [facility] 12/29/2014, 9:00 AM

## 2014-12-30 ENCOUNTER — Other Ambulatory Visit (HOSPITAL_BASED_OUTPATIENT_CLINIC_OR_DEPARTMENT_OTHER): Payer: Medicare HMO

## 2014-12-30 DIAGNOSIS — C3491 Malignant neoplasm of unspecified part of right bronchus or lung: Secondary | ICD-10-CM

## 2014-12-30 DIAGNOSIS — C3411 Malignant neoplasm of upper lobe, right bronchus or lung: Secondary | ICD-10-CM

## 2014-12-30 LAB — CBC WITH DIFFERENTIAL/PLATELET
BASO%: 0.3 % (ref 0.0–2.0)
BASOS ABS: 0 10*3/uL (ref 0.0–0.1)
EOS%: 0.2 % (ref 0.0–7.0)
Eosinophils Absolute: 0 10*3/uL (ref 0.0–0.5)
HEMATOCRIT: 31.7 % — AB (ref 38.4–49.9)
HGB: 10.7 g/dL — ABNORMAL LOW (ref 13.0–17.1)
LYMPH%: 10.5 % — AB (ref 14.0–49.0)
MCH: 34 pg — AB (ref 27.2–33.4)
MCHC: 33.9 g/dL (ref 32.0–36.0)
MCV: 100.5 fL — ABNORMAL HIGH (ref 79.3–98.0)
MONO#: 0.9 10*3/uL (ref 0.1–0.9)
MONO%: 13.6 % (ref 0.0–14.0)
NEUT#: 5 10*3/uL (ref 1.5–6.5)
NEUT%: 75.4 % — ABNORMAL HIGH (ref 39.0–75.0)
Platelets: 191 10*3/uL (ref 140–400)
RBC: 3.16 10*6/uL — ABNORMAL LOW (ref 4.20–5.82)
RDW: 15.1 % — AB (ref 11.0–14.6)
WBC: 6.6 10*3/uL (ref 4.0–10.3)
lymph#: 0.7 10*3/uL — ABNORMAL LOW (ref 0.9–3.3)

## 2014-12-30 LAB — COMPREHENSIVE METABOLIC PANEL (CC13)
ALT: 21 U/L (ref 0–55)
AST: 17 U/L (ref 5–34)
Albumin: 2.6 g/dL — ABNORMAL LOW (ref 3.5–5.0)
Alkaline Phosphatase: 66 U/L (ref 40–150)
Anion Gap: 14 mEq/L — ABNORMAL HIGH (ref 3–11)
BUN: 12.5 mg/dL (ref 7.0–26.0)
CALCIUM: 10.2 mg/dL (ref 8.4–10.4)
CHLORIDE: 102 meq/L (ref 98–109)
CO2: 19 mEq/L — ABNORMAL LOW (ref 22–29)
Creatinine: 1.4 mg/dL — ABNORMAL HIGH (ref 0.7–1.3)
EGFR: 60 mL/min/{1.73_m2} — ABNORMAL LOW (ref >90)
Glucose: 120 mg/dl (ref 70–140)
Potassium: 4.1 mEq/L (ref 3.5–5.1)
Sodium: 135 mEq/L — ABNORMAL LOW (ref 136–145)
Total Bilirubin: 0.22 mg/dL (ref 0.20–1.20)
Total Protein: 9.3 g/dL — ABNORMAL HIGH (ref 6.4–8.3)

## 2015-01-01 LAB — CULTURE, BLOOD (ROUTINE X 2)
CULTURE: NO GROWTH
Culture: NO GROWTH

## 2015-01-02 ENCOUNTER — Other Ambulatory Visit: Payer: Self-pay

## 2015-01-02 ENCOUNTER — Encounter (HOSPITAL_COMMUNITY): Payer: Self-pay | Admitting: Emergency Medicine

## 2015-01-02 ENCOUNTER — Emergency Department (HOSPITAL_COMMUNITY): Payer: Medicare HMO

## 2015-01-02 ENCOUNTER — Emergency Department (HOSPITAL_COMMUNITY)
Admission: EM | Admit: 2015-01-02 | Discharge: 2015-01-02 | Disposition: A | Discharge status: Home / Self Care | Payer: Medicare HMO | Source: Home / Self Care | Attending: Emergency Medicine | Admitting: Emergency Medicine

## 2015-01-02 DIAGNOSIS — D6481 Anemia due to antineoplastic chemotherapy: Secondary | ICD-10-CM | POA: Diagnosis present

## 2015-01-02 DIAGNOSIS — F0390 Unspecified dementia without behavioral disturbance: Secondary | ICD-10-CM

## 2015-01-02 DIAGNOSIS — C3491 Malignant neoplasm of unspecified part of right bronchus or lung: Secondary | ICD-10-CM | POA: Diagnosis not present

## 2015-01-02 DIAGNOSIS — H5442 Blindness, left eye, normal vision right eye: Secondary | ICD-10-CM | POA: Diagnosis present

## 2015-01-02 DIAGNOSIS — N179 Acute kidney failure, unspecified: Secondary | ICD-10-CM | POA: Diagnosis present

## 2015-01-02 DIAGNOSIS — Z862 Personal history of diseases of the blood and blood-forming organs and certain disorders involving the immune mechanism: Secondary | ICD-10-CM | POA: Insufficient documentation

## 2015-01-02 DIAGNOSIS — I10 Essential (primary) hypertension: Secondary | ICD-10-CM | POA: Insufficient documentation

## 2015-01-02 DIAGNOSIS — J449 Chronic obstructive pulmonary disease, unspecified: Secondary | ICD-10-CM | POA: Diagnosis present

## 2015-01-02 DIAGNOSIS — Z79899 Other long term (current) drug therapy: Secondary | ICD-10-CM

## 2015-01-02 DIAGNOSIS — Z87891 Personal history of nicotine dependence: Secondary | ICD-10-CM

## 2015-01-02 DIAGNOSIS — J9601 Acute respiratory failure with hypoxia: Secondary | ICD-10-CM | POA: Diagnosis present

## 2015-01-02 DIAGNOSIS — N182 Chronic kidney disease, stage 2 (mild): Secondary | ICD-10-CM | POA: Diagnosis present

## 2015-01-02 DIAGNOSIS — J189 Pneumonia, unspecified organism: Secondary | ICD-10-CM | POA: Diagnosis present

## 2015-01-02 DIAGNOSIS — Z7951 Long term (current) use of inhaled steroids: Secondary | ICD-10-CM

## 2015-01-02 DIAGNOSIS — J188 Other pneumonia, unspecified organism: Secondary | ICD-10-CM

## 2015-01-02 DIAGNOSIS — T451X5A Adverse effect of antineoplastic and immunosuppressive drugs, initial encounter: Secondary | ICD-10-CM | POA: Diagnosis present

## 2015-01-02 DIAGNOSIS — D7589 Other specified diseases of blood and blood-forming organs: Secondary | ICD-10-CM | POA: Diagnosis present

## 2015-01-02 DIAGNOSIS — I129 Hypertensive chronic kidney disease with stage 1 through stage 4 chronic kidney disease, or unspecified chronic kidney disease: Secondary | ICD-10-CM | POA: Diagnosis present

## 2015-01-02 DIAGNOSIS — Z85118 Personal history of other malignant neoplasm of bronchus and lung: Secondary | ICD-10-CM | POA: Insufficient documentation

## 2015-01-02 DIAGNOSIS — C3400 Malignant neoplasm of unspecified main bronchus: Secondary | ICD-10-CM | POA: Diagnosis present

## 2015-01-02 DIAGNOSIS — D72819 Decreased white blood cell count, unspecified: Secondary | ICD-10-CM | POA: Diagnosis present

## 2015-01-02 DIAGNOSIS — R0600 Dyspnea, unspecified: Secondary | ICD-10-CM | POA: Diagnosis not present

## 2015-01-02 LAB — CBC WITH DIFFERENTIAL/PLATELET
BASOS PCT: 1 % (ref 0–1)
Basophils Absolute: 0 10*3/uL (ref 0.0–0.1)
EOS ABS: 0 10*3/uL (ref 0.0–0.7)
Eosinophils Relative: 0 % (ref 0–5)
HEMATOCRIT: 29.9 % — AB (ref 39.0–52.0)
Hemoglobin: 10 g/dL — ABNORMAL LOW (ref 13.0–17.0)
Lymphocytes Relative: 22 % (ref 12–46)
Lymphs Abs: 0.9 10*3/uL (ref 0.7–4.0)
MCH: 33.9 pg (ref 26.0–34.0)
MCHC: 33.4 g/dL (ref 30.0–36.0)
MCV: 101.4 fL — AB (ref 78.0–100.0)
MONO ABS: 0.9 10*3/uL (ref 0.1–1.0)
Monocytes Relative: 20 % — ABNORMAL HIGH (ref 3–12)
Neutro Abs: 2.4 10*3/uL (ref 1.7–7.7)
Neutrophils Relative %: 57 % (ref 43–77)
Platelets: 220 10*3/uL (ref 150–400)
RBC: 2.95 MIL/uL — ABNORMAL LOW (ref 4.22–5.81)
RDW: 14.4 % (ref 11.5–15.5)
WBC: 4.2 10*3/uL (ref 4.0–10.5)

## 2015-01-02 LAB — BASIC METABOLIC PANEL
Anion gap: 8 (ref 5–15)
BUN: 17 mg/dL (ref 6–23)
CHLORIDE: 105 mmol/L (ref 96–112)
CO2: 24 mmol/L (ref 19–32)
Calcium: 9.8 mg/dL (ref 8.4–10.5)
Creatinine, Ser: 1.25 mg/dL (ref 0.50–1.35)
GFR calc Af Amer: 67 mL/min — ABNORMAL LOW (ref >90)
GFR calc non Af Amer: 58 mL/min — ABNORMAL LOW (ref >90)
GLUCOSE: 95 mg/dL (ref 70–99)
POTASSIUM: 4.6 mmol/L (ref 3.5–5.1)
Sodium: 137 mmol/L (ref 135–145)

## 2015-01-02 LAB — I-STAT TROPONIN, ED: TROPONIN I, POC: 0 ng/mL (ref 0.00–0.08)

## 2015-01-02 MED ORDER — AMOXICILLIN-POT CLAVULANATE 875-125 MG PO TABS
1.0000 | ORAL_TABLET | Freq: Once | ORAL | Status: AC
  Administered 2015-01-02: 1 via ORAL
  Filled 2015-01-02: qty 1

## 2015-01-02 MED ORDER — AMOXICILLIN-POT CLAVULANATE 875-125 MG PO TABS: 1.0000 | ORAL_TABLET | Freq: Two times a day (BID) | ORAL | Status: DC

## 2015-01-02 NOTE — Discharge Instructions (Signed)
Please fill the Augmentin and start taking this medication in the morning.  Get rechecked immediately if you develop fevers or worsening breathing.    Pneumonia Pneumonia is an infection of the lungs.  CAUSES Pneumonia may be caused by bacteria or a virus. Usually, these infections are caused by breathing infectious particles into the lungs (respiratory tract). SIGNS AND SYMPTOMS   Cough.  Fever.  Chest pain.  Increased rate of breathing.  Wheezing.  Mucus production. DIAGNOSIS  If you have the common symptoms of pneumonia, your health care provider will typically confirm the diagnosis with a chest X-ray. The X-ray will show an abnormality in the lung (pulmonary infiltrate) if you have pneumonia. Other tests of your blood, urine, or sputum may be done to find the specific cause of your pneumonia. Your health care provider may also do tests (blood gases or pulse oximetry) to see how well your lungs are working. TREATMENT  Some forms of pneumonia may be spread to other people when you cough or sneeze. You may be asked to wear a mask before and during your exam. Pneumonia that is caused by bacteria is treated with antibiotic medicine. Pneumonia that is caused by the influenza virus may be treated with an antiviral medicine. Most other viral infections must run their course. These infections will not respond to antibiotics.  HOME CARE INSTRUCTIONS   Cough suppressants may be used if you are losing too much rest. However, coughing protects you by clearing your lungs. You should avoid using cough suppressants if you can.  Your health care provider may have prescribed medicine if he or she thinks your pneumonia is caused by bacteria or influenza. Finish your medicine even if you start to feel better.  Your health care provider may also prescribe an expectorant. This loosens the mucus to be coughed up.  Take medicines only as directed by your health care provider.  Do not smoke. Smoking is a  common cause of bronchitis and can contribute to pneumonia. If you are a smoker and continue to smoke, your cough may last several weeks after your pneumonia has cleared.  A cold steam vaporizer or humidifier in your room or home may help loosen mucus.  Coughing is often worse at night. Sleeping in a semi-upright position in a recliner or using a couple pillows under your head will help with this.  Get rest as you feel it is needed. Your body will usually let you know when you need to rest. PREVENTION A pneumococcal shot (vaccine) is available to prevent a common bacterial cause of pneumonia. This is usually suggested for:  People over 48 years old.  Patients on chemotherapy.  People with chronic lung problems, such as bronchitis or emphysema.  People with immune system problems. If you are over 65 or have a high risk condition, you may receive the pneumococcal vaccine if you have not received it before. In some countries, a routine influenza vaccine is also recommended. This vaccine can help prevent some cases of pneumonia.You may be offered the influenza vaccine as part of your care. If you smoke, it is time to quit. You may receive instructions on how to stop smoking. Your health care provider can provide medicines and counseling to help you quit. SEEK MEDICAL CARE IF: You have a fever. SEEK IMMEDIATE MEDICAL CARE IF:   Your illness becomes worse. This is especially true if you are elderly or weakened from any other disease.  You cannot control your cough with suppressants and are  losing sleep.  You begin coughing up blood.  You develop pain which is getting worse or is uncontrolled with medicines.  Any of the symptoms which initially brought you in for treatment are getting worse rather than better.  You develop shortness of breath or chest pain. MAKE SURE YOU:   Understand these instructions.  Will watch your condition.  Will get help right away if you are not doing well  or get worse. Document Released: 08/22/2005 Document Revised: 01/06/2014 Document Reviewed: 11/11/2010 [facility] Patient Information 2015 [place], [place]. This information is not intended to replace advice given to you by your health care provider. Make sure you discuss any questions you have with your health care provider.

## 2015-01-02 NOTE — ED Notes (Signed)
RN drawing labs 

## 2015-01-02 NOTE — ED Notes (Addendum)
Pt from home via [facility] c/o SOB. Pt has recent dx of PNA and was d/c from hospital 4/22 for the same. Pt has hx of Lung Ca. Pt received home albuterol neb approx 1 hr PTA. Pt also has hx of short term memory loss and is oriented to his baseline per family. Pt in NAD. EMS reports that she placed O2 for comfort per pt request.

## 2015-01-02 NOTE — ED Provider Notes (Signed)
CSN: [id number]     Arrival date & time 01/02/15  1221 History   First MD Initiated Contact with Patient 01/02/15 1257     Chief Complaint  Patient presents with  . Shortness of Breath  . Pneumonia     Patient is a 69 y.o. male presenting with shortness of breath and pneumonia. The history is provided by the patient. No language interpreter was used.  Shortness of Breath Pneumonia Associated symptoms include shortness of breath.   Stephen Ellis presents for evaluation of cough.  Level V caveat due to dementia.  He states that he had a lot of coughing last night and this morning.  He denies fevers, chest pain, abdominal pain, vomiting, diarrhea, leg edema.  He was recently hospitalized for pneumonia and states that he has been compliant with his medications.  He lives with Stephen Ellis, who helps him.  Stephen Ellis is currently working and not available for additional hx.  Sxs are moderate, waxing and waning, resolved.    Past Medical History  Diagnosis Date  . Syncope   . Dehydration   . Blind left eye   . Hypertension   . Anemia     "low Blood"  . Dementia     poor memory  . Cancer     Lung  . Lung cancer 08/12/14    Right lung   Past Surgical History  Procedure Laterality Date  . No past surgeries    . Video bronchoscopy Bilateral 07/29/2014    Procedure: VIDEO BRONCHOSCOPY WITHOUT FLUORO;  Surgeon: Tanda Rockers, MD;  Location: [facility] ENDOSCOPY;  Service: Cardiopulmonary;  Laterality: Bilateral;  . Video bronchoscopy Bilateral 08/12/2014    Procedure: VIDEO BRONCHOSCOPY WITHOUT FLUORO;  Surgeon: Wilhelmina Mcardle, MD;  Location: [facility] ENDOSCOPY;  Service: Cardiopulmonary;  Laterality: Bilateral;   Family History  Problem Relation Age of Onset  . Hypertension Brother    History  Substance Use Topics  . Smoking status: Former Smoker -- 1.00 packs/day for 38 years    Quit date: 06/24/2014  . Smokeless tobacco: Never Used  . Alcohol Use: No    Review of Systems  Respiratory: Positive for  shortness of breath.   All other systems reviewed and are negative.     Allergies  Review of patient's allergies indicates no known allergies.  Home Medications   Prior to Admission medications   Medication Sig Start Date End Date Taking? Authorizing Provider  albuterol (PROVENTIL) (2.5 MG/3ML) 0.083% nebulizer solution Take 3 mLs (2.5 mg total) by nebulization every 6 (six) hours as needed for wheezing or shortness of breath. 10/19/14  Yes Ezequiel Essex, MD  amLODipine (NORVASC) 10 MG tablet Take 1 tablet (10 mg total) by mouth daily. 10/14/14  Yes Susanne Borders, NP  budesonide-formoterol (SYMBICORT) 160-4.5 MCG/ACT inhaler Inhale 2 puffs into the lungs 2 (two) times daily. 10/14/14  Yes Susanne Borders, NP  ipratropium-albuterol (DUONEB) 0.5-2.5 (3) MG/3ML SOLN Take 3 mLs by nebulization every 6 (six) hours. Patient taking differently: Take 3 mLs by nebulization every 6 (six) hours as needed (SOB, wheezing).  08/09/14  Yes Thurnell Lose, MD  Probiotic Product (PROBIOTIC DAILY PO) Take 1 tablet by mouth daily.   Yes Historical Provider, MD  amoxicillin-clavulanate (AUGMENTIN) 875-125 MG per tablet Take 1 tablet by mouth every 12 (twelve) hours. Patient not taking: Reported on 01/02/2015 12/29/14   Kelvin Cellar, MD  prochlorperazine (COMPAZINE) 10 MG tablet Take 1 tablet (10 mg total) by mouth every 6 (six) hours  as needed for nausea or vomiting. Patient not taking: Reported on 01/02/2015 09/22/14   Curt Bears, MD   BP 115/79 mmHg  Pulse 96  Temp(Src) 98 F (36.7 C) (Oral)  Resp 18  SpO2 95% Physical Exam  Constitutional: He is oriented to person, place, and time. He appears well-developed and well-nourished.  HENT:  Head: Normocephalic and atraumatic.  Eyes:  Irregular left pupil, deviated to the left  Cardiovascular: Normal rate and regular rhythm.   No murmur heard. Pulmonary/Chest: Effort normal and breath sounds normal. No respiratory distress.  Abdominal: Soft. There  is no tenderness. There is no rebound and no guarding.  Musculoskeletal: He exhibits no edema or tenderness.  Neurological: He is alert and oriented to person, place, and time.  Skin: Skin is warm and dry.  Psychiatric: He has a normal mood and affect. His behavior is normal.  Nursing note and vitals reviewed.   ED Course  Procedures (including critical care time) Labs Review Labs Reviewed  BASIC METABOLIC PANEL - Abnormal; Notable for the following:    GFR calc non Af Amer 58 (*)    GFR calc Af Amer 67 (*)    All other components within normal limits  CBC WITH DIFFERENTIAL/PLATELET - Abnormal; Notable for the following:    RBC 2.95 (*)    Hemoglobin 10.0 (*)    HCT 29.9 (*)    MCV 101.4 (*)    Monocytes Relative 20 (*)    All other components within normal limits  Randolm Idol, ED    Imaging Review Dg Chest 2 View  01/02/2015   CLINICAL DATA:  68 year old male with cough and shortness of Breath. Current history of lung cancer with obstructing right suprahilar mass. Subsequent encounter.  EXAM: CHEST  2 VIEW  COMPARISON:  Chest CT 12/26/2014 and earlier.  FINDINGS: Mildly lower lung volumes. Abnormal confluent right lower lobe opacity better demonstrated on 12/26/2014. No associated pleural effusion identified.  Abnormal right mediastinal contour with medial right lung collapse again noted and appears radiographically progressed since 10/19/2014. No pneumothorax or pulmonary edema. Stable left lung. Stable visualized osseous structures.  IMPRESSION: 1. Right lower lobe pneumonia. No pleural effusion identified. See also chest CT 12/26/2014. 2. Right suprahilar lung mass with medial upper lobe collapse appears radiographically progressed since February.   Electronically Signed   By: Genevie Ann M.D.   On: 01/02/2015 14:52     EKG Interpretation None      MDM   Final diagnoses:  Postobstructive pneumonia    Patient here for evaluation of cough. Reviewed records from prior  hospitalization. He was hospitalized for a postobstructive pneumonia. He was started on Augmentin at the time of discharge. Chest x-ray appears stable compared to prior CT scan several days ago. Additional history available from patient's family member. It appears the patient did not get his antibiotics filled due to not getting the prescription at time of discharge. Patient has been started on Augmentin in the emergency department with plan to DC home on Augmentin for 7 days. Patient is nontoxic appearing in the emergency department with no respiratory distress. Labs are improved from recent labs during his hospitalization. Discussed home care for a postobstructive pneumonia as well as return precautions. Patient has a follow-up appointment with Dr. Earlie Server with oncology on Monday.    Quintella Reichert, MD 01/02/15 1626

## 2015-01-02 NOTE — Progress Notes (Addendum)
EDP, Dr Ralene Bathe inquired about pt d/c medications from 12/29/14--dx med only for augmentin Cm confirmed with [facility] outpatient pharmacist and [facility] on [address] that pt did not fill rx for augmentin.  Last filled med at [facility] with symicort on 12/30/14 and [facility]15/14  CM spoke with pt and male family member to confirm pt had no Rx sent home with him on 12/29/14 but was told to continue listed home meds Male called in nebulizer and pending being filled at outpatient pharmacy.  Dr Ralene Bathe updated   CM confirmed pt [facility] by [facility] on 12/30/14 and Pt/OT saw pt on 12/31/14 Pt d/c with orders for [facility], PT/OT --Scheduled to be seen by nursing in 01/03/15   1618 CM spoke with Charlett Nose at [facility] who states family need to consult with Dr Earlie Server because pt received assist from "green card" with cost of medications No nebulizer rx has been filled and is not ready for pick up at this time.  Dr Ralene Bathe present and may be available to write order for neb and augmentin.  Pt male family updated

## 2015-01-02 NOTE — ED Notes (Signed)
Attempted to obtain labs with IV start, no blood would pull back. Phlebotomy notified

## 2015-01-02 NOTE — ED Notes (Signed)
Bed: YJ09 Expected date:  Expected time:  Means of arrival:  Comments: EMS-asthma

## 2015-01-04 ENCOUNTER — Encounter (HOSPITAL_COMMUNITY): Payer: Self-pay | Admitting: Emergency Medicine

## 2015-01-04 ENCOUNTER — Inpatient Hospital Stay (HOSPITAL_COMMUNITY)
Admission: EM | Admit: 2015-01-04 | Discharge: 2015-01-09 | DRG: 180 | Disposition: A | Discharge status: Skilled Nursing Facility | Payer: Medicare HMO | Attending: Family Medicine | Admitting: Family Medicine

## 2015-01-04 ENCOUNTER — Other Ambulatory Visit (HOSPITAL_COMMUNITY): Payer: Self-pay

## 2015-01-04 ENCOUNTER — Other Ambulatory Visit: Payer: Self-pay

## 2015-01-04 ENCOUNTER — Emergency Department (HOSPITAL_COMMUNITY): Payer: Medicare HMO

## 2015-01-04 DIAGNOSIS — R0600 Dyspnea, unspecified: Secondary | ICD-10-CM | POA: Diagnosis present

## 2015-01-04 DIAGNOSIS — T451X5A Adverse effect of antineoplastic and immunosuppressive drugs, initial encounter: Secondary | ICD-10-CM | POA: Diagnosis present

## 2015-01-04 DIAGNOSIS — J9600 Acute respiratory failure, unspecified whether with hypoxia or hypercapnia: Secondary | ICD-10-CM | POA: Diagnosis present

## 2015-01-04 DIAGNOSIS — D72819 Decreased white blood cell count, unspecified: Secondary | ICD-10-CM | POA: Diagnosis present

## 2015-01-04 DIAGNOSIS — J9601 Acute respiratory failure with hypoxia: Secondary | ICD-10-CM | POA: Diagnosis present

## 2015-01-04 DIAGNOSIS — E871 Hypo-osmolality and hyponatremia: Secondary | ICD-10-CM | POA: Diagnosis present

## 2015-01-04 DIAGNOSIS — C3411 Malignant neoplasm of upper lobe, right bronchus or lung: Secondary | ICD-10-CM

## 2015-01-04 DIAGNOSIS — I129 Hypertensive chronic kidney disease with stage 1 through stage 4 chronic kidney disease, or unspecified chronic kidney disease: Secondary | ICD-10-CM | POA: Diagnosis present

## 2015-01-04 DIAGNOSIS — E8809 Other disorders of plasma-protein metabolism, not elsewhere classified: Secondary | ICD-10-CM | POA: Diagnosis present

## 2015-01-04 DIAGNOSIS — D7589 Other specified diseases of blood and blood-forming organs: Secondary | ICD-10-CM | POA: Diagnosis present

## 2015-01-04 DIAGNOSIS — J449 Chronic obstructive pulmonary disease, unspecified: Secondary | ICD-10-CM | POA: Diagnosis present

## 2015-01-04 DIAGNOSIS — E876 Hypokalemia: Secondary | ICD-10-CM | POA: Diagnosis present

## 2015-01-04 DIAGNOSIS — C3401 Malignant neoplasm of right main bronchus: Secondary | ICD-10-CM | POA: Diagnosis not present

## 2015-01-04 DIAGNOSIS — H5442 Blindness, left eye, normal vision right eye: Secondary | ICD-10-CM | POA: Diagnosis present

## 2015-01-04 DIAGNOSIS — N189 Chronic kidney disease, unspecified: Secondary | ICD-10-CM

## 2015-01-04 DIAGNOSIS — N182 Chronic kidney disease, stage 2 (mild): Secondary | ICD-10-CM | POA: Diagnosis present

## 2015-01-04 DIAGNOSIS — T17908S Unspecified foreign body in respiratory tract, part unspecified causing other injury, sequela: Secondary | ICD-10-CM

## 2015-01-04 DIAGNOSIS — N179 Acute kidney failure, unspecified: Secondary | ICD-10-CM | POA: Diagnosis present

## 2015-01-04 DIAGNOSIS — Z87891 Personal history of nicotine dependence: Secondary | ICD-10-CM | POA: Diagnosis not present

## 2015-01-04 DIAGNOSIS — D61810 Antineoplastic chemotherapy induced pancytopenia: Secondary | ICD-10-CM | POA: Diagnosis present

## 2015-01-04 DIAGNOSIS — C3491 Malignant neoplasm of unspecified part of right bronchus or lung: Secondary | ICD-10-CM | POA: Diagnosis present

## 2015-01-04 DIAGNOSIS — J90 Pleural effusion, not elsewhere classified: Secondary | ICD-10-CM | POA: Diagnosis present

## 2015-01-04 DIAGNOSIS — D638 Anemia in other chronic diseases classified elsewhere: Secondary | ICD-10-CM | POA: Diagnosis not present

## 2015-01-04 DIAGNOSIS — D6481 Anemia due to antineoplastic chemotherapy: Secondary | ICD-10-CM | POA: Diagnosis present

## 2015-01-04 DIAGNOSIS — F0390 Unspecified dementia without behavioral disturbance: Secondary | ICD-10-CM | POA: Diagnosis present

## 2015-01-04 DIAGNOSIS — J189 Pneumonia, unspecified organism: Secondary | ICD-10-CM | POA: Diagnosis present

## 2015-01-04 DIAGNOSIS — C3400 Malignant neoplasm of unspecified main bronchus: Secondary | ICD-10-CM | POA: Diagnosis present

## 2015-01-04 DIAGNOSIS — C3410 Malignant neoplasm of upper lobe, unspecified bronchus or lung: Secondary | ICD-10-CM | POA: Diagnosis present

## 2015-01-04 LAB — D-DIMER, QUANTITATIVE: D-Dimer, Quant: 1.66 ug/mL-FEU — ABNORMAL HIGH (ref 0.00–0.48)

## 2015-01-04 LAB — CBC WITH DIFFERENTIAL/PLATELET
BASOS ABS: 0 10*3/uL (ref 0.0–0.1)
BASOS PCT: 0 % (ref 0–1)
EOS PCT: 0 % (ref 0–5)
Eosinophils Absolute: 0 10*3/uL (ref 0.0–0.7)
HCT: 32.3 % — ABNORMAL LOW (ref 39.0–52.0)
Hemoglobin: 10.8 g/dL — ABNORMAL LOW (ref 13.0–17.0)
Lymphocytes Relative: 12 % (ref 12–46)
Lymphs Abs: 0.8 10*3/uL (ref 0.7–4.0)
MCH: 33.6 pg (ref 26.0–34.0)
MCHC: 33.4 g/dL (ref 30.0–36.0)
MCV: 100.6 fL — ABNORMAL HIGH (ref 78.0–100.0)
Monocytes Absolute: 0.7 10*3/uL (ref 0.1–1.0)
Monocytes Relative: 10 % (ref 3–12)
Neutro Abs: 5.4 10*3/uL (ref 1.7–7.7)
Neutrophils Relative %: 78 % — ABNORMAL HIGH (ref 43–77)
PLATELETS: 210 10*3/uL (ref 150–400)
RBC: 3.21 MIL/uL — AB (ref 4.22–5.81)
RDW: 14.4 % (ref 11.5–15.5)
WBC: 6.8 10*3/uL (ref 4.0–10.5)

## 2015-01-04 LAB — BLOOD GAS, ARTERIAL
Acid-base deficit: 1.9 mmol/L (ref 0.0–2.0)
BICARBONATE: 21 meq/L (ref 20.0–24.0)
Drawn by: 331471
FIO2: 1 %
O2 Saturation: 92.8 %
PATIENT TEMPERATURE: 98.6
PO2 ART: 72.6 mmHg — AB (ref 80.0–100.0)
TCO2: 19.3 mmol/L (ref 0–100)
pCO2 arterial: 31.3 mmHg — ABNORMAL LOW (ref 35.0–45.0)
pH, Arterial: 7.443 (ref 7.350–7.450)

## 2015-01-04 LAB — BASIC METABOLIC PANEL
ANION GAP: 8 (ref 5–15)
BUN: 18 mg/dL (ref 6–20)
CO2: 22 mmol/L (ref 22–32)
CREATININE: 1.34 mg/dL — AB (ref 0.61–1.24)
Calcium: 10 mg/dL (ref 8.9–10.3)
Chloride: 108 mmol/L (ref 101–111)
GFR calc non Af Amer: 53 mL/min — ABNORMAL LOW (ref >60)
Glucose, Bld: 111 mg/dL — ABNORMAL HIGH (ref 70–99)
Potassium: 4 mmol/L (ref 3.5–5.1)
Sodium: 138 mmol/L (ref 135–145)

## 2015-01-04 LAB — TROPONIN I

## 2015-01-04 LAB — MRSA PCR SCREENING: MRSA by PCR: NEGATIVE

## 2015-01-04 MED ORDER — PROCHLORPERAZINE MALEATE 10 MG PO TABS: 10.0000 mg | ORAL_TABLET | Freq: Four times a day (QID) | ORAL | Status: DC | PRN

## 2015-01-04 MED ORDER — PIPERACILLIN-TAZOBACTAM 3.375 G IVPB
3.3750 g | Freq: Three times a day (TID) | INTRAVENOUS | Status: DC
  Administered 2015-01-04 – 2015-01-06 (×5): 3.375 g via INTRAVENOUS
  Filled 2015-01-04 (×5): qty 50

## 2015-01-04 MED ORDER — VANCOMYCIN HCL IN DEXTROSE 1-5 GM/200ML-% IV SOLN
1000.0000 mg | INTRAVENOUS | Status: DC
  Administered 2015-01-04: 1000 mg via INTRAVENOUS
  Filled 2015-01-04: qty 200

## 2015-01-04 MED ORDER — HEPARIN SODIUM (PORCINE) 5000 UNIT/ML IJ SOLN
5000.0000 [IU] | Freq: Three times a day (TID) | INTRAMUSCULAR | Status: DC
  Administered 2015-01-04 – 2015-01-09 (×15): 5000 [IU] via SUBCUTANEOUS
  Filled 2015-01-04 (×14): qty 1

## 2015-01-04 MED ORDER — SODIUM CHLORIDE 0.9 % IV SOLN
INTRAVENOUS | Status: DC
  Administered 2015-01-04 – 2015-01-07 (×5): via INTRAVENOUS

## 2015-01-04 MED ORDER — METHYLPREDNISOLONE SODIUM SUCC 125 MG IJ SOLR
80.0000 mg | Freq: Four times a day (QID) | INTRAMUSCULAR | Status: DC
  Administered 2015-01-04 – 2015-01-06 (×8): 80 mg via INTRAVENOUS
  Filled 2015-01-04 (×2): qty 2
  Filled 2015-01-04: qty 1.28
  Filled 2015-01-04: qty 2
  Filled 2015-01-04 (×2): qty 1.28
  Filled 2015-01-04 (×2): qty 2
  Filled 2015-01-04: qty 1.28

## 2015-01-04 MED ORDER — ALBUTEROL SULFATE (2.5 MG/3ML) 0.083% IN NEBU: 2.5000 mg | INHALATION_SOLUTION | Freq: Four times a day (QID) | RESPIRATORY_TRACT | Status: DC | PRN

## 2015-01-04 MED ORDER — DEXTROSE 5 % IV SOLN
1.0000 g | Freq: Three times a day (TID) | INTRAVENOUS | Status: DC
  Filled 2015-01-04: qty 1

## 2015-01-04 MED ORDER — DEXTROSE 5 % IV SOLN
2.0000 g | INTRAVENOUS | Status: AC
  Administered 2015-01-04: 2 g via INTRAVENOUS
  Filled 2015-01-04: qty 2

## 2015-01-04 MED ORDER — VANCOMYCIN HCL IN DEXTROSE 750-5 MG/150ML-% IV SOLN: 750.0000 mg | Freq: Two times a day (BID) | INTRAVENOUS | Status: DC

## 2015-01-04 MED ORDER — BUDESONIDE-FORMOTEROL FUMARATE 160-4.5 MCG/ACT IN AERO
2.0000 | INHALATION_SPRAY | Freq: Two times a day (BID) | RESPIRATORY_TRACT | Status: DC
  Administered 2015-01-04 – 2015-01-09 (×10): 2 via RESPIRATORY_TRACT
  Filled 2015-01-04: qty 6

## 2015-01-04 MED ORDER — IOHEXOL 350 MG/ML SOLN
100.0000 mL | Freq: Once | INTRAVENOUS | Status: AC | PRN
  Administered 2015-01-04: 100 mL via INTRAVENOUS

## 2015-01-04 NOTE — ED Notes (Addendum)
While Dr Verlon Au was at bedside, pt started to cough. Pt desat to 89% on 2  Liters. Per Dr Verlon Au, pt placed on NRB and is now at 100%. Respiratory called to do ABG

## 2015-01-04 NOTE — Progress Notes (Addendum)
  Radiation Oncology         [phone]) [phone] ________________________________  Name: Stephen Ellis  MRN: [id number]  Date: 01/04/2015  DOB: 20-Jun-1946  Chart Note:  I spoke with Dr. Verlon Au regarding this patient and reviewed his most recent findings and wanted to take a minute to document my impression.  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 He was treated for this using a single fraction of radiation by one of my colleagues, Dr. Pablo Ledger on 09/04/2014. At this point, it appears that he may benefit from additional radiation to the right hilar mass to help palliate postobstructive pneumonia and prevent further worsening of his airway. We will coordinate evaluation and possible CT simulation for palliative radiotherapy on 01/05/15.  ________________________________  Sheral Apley. Tammi Klippel, M.D.

## 2015-01-04 NOTE — ED Notes (Signed)
Patient transported to CT 

## 2015-01-04 NOTE — ED Notes (Addendum)
Pt has recently treated for pneumonia. Hx of lung ca. Was here a few days ago, was given IV ABX and discharged with abx prescription. Pt reports abx is not helping and is feeling worse. Pt not on chemo. Pt speaking in full sentences with no difficulty. Pt having the same chest pain he had a few days ago.

## 2015-01-04 NOTE — ED Provider Notes (Addendum)
CSN: [id number]     Arrival date & time 01/04/15  6295 History   First MD Initiated Contact with Patient 01/04/15 [phone]     Chief Complaint  Patient presents with  . Chest Pain     (Consider location/radiation/quality/duration/timing/severity/associated sxs/prior Treatment) HPI... Level V caveat for dementia. Patient has known lung cancer. Patient seen at [facility] emergency department on 01/02/2015 and placed on Augmentin for presumptive pneumonia. He is now more dyspneic and tachypneic. No fevers, sweats, chills, rusty sputum. He is currently taking chemotherapy for his lung cancer.  Past Medical History  Diagnosis Date  . Syncope   . Dehydration   . Blind left eye   . Hypertension   . Anemia     "low Blood"  . Dementia     poor memory  . Cancer     Lung  . Lung cancer 08/12/14    Right lung   Past Surgical History  Procedure Laterality Date  . No past surgeries    . Video bronchoscopy Bilateral 07/29/2014    Procedure: VIDEO BRONCHOSCOPY WITHOUT FLUORO;  Surgeon: Tanda Rockers, MD;  Location: [facility] ENDOSCOPY;  Service: Cardiopulmonary;  Laterality: Bilateral;  . Video bronchoscopy Bilateral 08/12/2014    Procedure: VIDEO BRONCHOSCOPY WITHOUT FLUORO;  Surgeon: Wilhelmina Mcardle, MD;  Location: [facility] ENDOSCOPY;  Service: Cardiopulmonary;  Laterality: Bilateral;   Family History  Problem Relation Age of Onset  . Hypertension Brother    History  Substance Use Topics  . Smoking status: Former Smoker -- 1.00 packs/day for 38 years    Quit date: 06/24/2014  . Smokeless tobacco: Never Used  . Alcohol Use: No    Review of Systems  Unable to perform ROS: Acuity of condition      Allergies  Review of patient's allergies indicates no known allergies.  Home Medications   Prior to Admission medications   Medication Sig Start Date End Date Taking? Authorizing Provider  albuterol (PROVENTIL) (2.5 MG/3ML) 0.083% nebulizer solution Take 3 mLs (2.5 mg total) by nebulization every 6  (six) hours as needed for wheezing or shortness of breath. 10/19/14  Yes Ezequiel Essex, MD  amLODipine (NORVASC) 10 MG tablet Take 1 tablet (10 mg total) by mouth daily. 10/14/14  Yes Susanne Borders, NP  amoxicillin-clavulanate (AUGMENTIN) 875-125 MG per tablet Take 1 tablet by mouth every 12 (twelve) hours. 01/02/15  Yes Quintella Reichert, MD  budesonide-formoterol [facility]) 160-4.5 MCG/ACT inhaler Inhale 2 puffs into the lungs 2 (two) times daily. 10/14/14  Yes Susanne Borders, NP  ipratropium-albuterol (DUONEB) 0.5-2.5 (3) MG/3ML SOLN Take 3 mLs by nebulization every 6 (six) hours. Patient taking differently: Take 3 mLs by nebulization every 6 (six) hours as needed (SOB, wheezing).  08/09/14  Yes Thurnell Lose, MD  Multiple Vitamin (MULTIVITAMIN WITH MINERALS) TABS tablet Take 1 tablet by mouth daily.   Yes Historical Provider, MD  Probiotic Product (PROBIOTIC DAILY PO) Take 1 tablet by mouth daily.   Yes Historical Provider, MD  prochlorperazine (COMPAZINE) 10 MG tablet Take 1 tablet (10 mg total) by mouth every 6 (six) hours as needed for nausea or vomiting. Patient not taking: Reported on 01/02/2015 09/22/14   Curt Bears, MD   BP 106/84 mmHg  Pulse 97  Temp(Src) 98.1 F (36.7 C) (Oral)  Resp 20  SpO2 95% Physical Exam  Constitutional: He is oriented to person, place, and time.  Tachypnea and dyspnea but no acute distress  HENT:  Head: Normocephalic and atraumatic.  Eyes: Conjunctivae and  EOM are normal. Pupils are equal, round, and reactive to light.  Neck: Normal range of motion. Neck supple.  Cardiovascular: Normal rate and regular rhythm.   Pulmonary/Chest:  Tachypnea  Abdominal: Soft. Bowel sounds are normal.  Musculoskeletal: Normal range of motion.  Neurological: He is alert and oriented to person, place, and time.  Skin: Skin is warm and dry.  Psychiatric: He has a normal mood and affect. His behavior is normal.  Nursing note and vitals reviewed.   ED Course   Procedures (including critical care time) Labs Review Labs Reviewed  CBC WITH DIFFERENTIAL/PLATELET - Abnormal; Notable for the following:    RBC 3.21 (*)    Hemoglobin 10.8 (*)    HCT 32.3 (*)    MCV 100.6 (*)    Neutrophils Relative % 78 (*)    All other components within normal limits  BASIC METABOLIC PANEL - Abnormal; Notable for the following:    Glucose, Bld 111 (*)    Creatinine, Ser 1.34 (*)    GFR calc non Af Amer 53 (*)    All other components within normal limits  D-DIMER, QUANTITATIVE - Abnormal; Notable for the following:    D-Dimer, Quant 1.66 (*)    All other components within normal limits  TROPONIN I    Imaging Review Dg Chest 2 View  01/02/2015   CLINICAL DATA:  69 year old male with cough and shortness of Breath. Current history of lung cancer with obstructing right suprahilar mass. Subsequent encounter.  EXAM: CHEST  2 VIEW  COMPARISON:  Chest CT 12/26/2014 and earlier.  FINDINGS: Mildly lower lung volumes. Abnormal confluent right lower lobe opacity better demonstrated on 12/26/2014. No associated pleural effusion identified.  Abnormal right mediastinal contour with medial right lung collapse again noted and appears radiographically progressed since 10/19/2014. No pneumothorax or pulmonary edema. Stable left lung. Stable visualized osseous structures.  IMPRESSION: 1. Right lower lobe pneumonia. No pleural effusion identified. See also chest CT 12/26/2014. 2. Right suprahilar lung mass with medial upper lobe collapse appears radiographically progressed since February.   Electronically Signed   By: Genevie Ann M.D.   On: 01/02/2015 14:52   Ct Angio Chest Pe W/cm &/or Wo Cm  01/04/2015   CLINICAL DATA:  Evaluate for pulmonary embolus. Central chest pain. D-dimer 1.66. Recent treatment for pneumonia. History of lung cancer.  EXAM: CT ANGIOGRAPHY CHEST WITH CONTRAST  TECHNIQUE: Multidetector CT imaging of the chest was performed using the standard protocol during bolus  administration of intravenous contrast. Multiplanar CT image reconstructions and MIPs were obtained to evaluate the vascular anatomy.  CONTRAST:  158m OMNIPAQUE IOHEXOL 350 MG/ML SOLN  COMPARISON:  CT of the chest on 12/26/2014  FINDINGS: Heart: Heart is normal in size. No pericardial effusion. No significant coronary artery calcifications.  Vascular structures: Pulmonary arteries are well opacified. No evidence for acute pulmonary embolus. There is poor opacification of the right posterior pulmonary vein. This may be related to shunting or significant consolidation within the right lower lobe. It is not possible to exclude tumor thrombus into the pulmonary vein.  Mediastinum/thyroid: Sub cm low density areas within the right lobe of the thyroid. Subcarinal lymph node is persistent. There is increased density in the right hilar region possibly representing adenopathy or mass. Soft tissue density in the region of the right mainstem bronchus is 2.6 x 2.6 cm.  Lungs/Airways: There is persistent consolidation within the right lower lobe. There is increased density in the right hilar region. There is masslike encroachment upon  the right mainstem bronchus. New consolidation is identified within the right upper lobe. The left mainstem bronchus and left lung remain clear.  Upper abdomen: Unremarkable.  Chest wall/osseous structures: No suspicious lytic or blastic lesions are identified.  Review of the MIP images confirms the above findings.  IMPRESSION: 1. Technically adequate exam showing no pulmonary embolus. 2. New soft tissue density in the right perihilar region with occlusion of the right mainstem bronchus. Findings may be related to tumor or infection occluding the bronchus. There is new associated consolidation of the right upper lobe and persistent consolidation of the right lower lobe accounting for the patient's symptoms. 3. Tumor thrombus versus poor opacification of the right posterior pulmonary vein. See  above.   Electronically Signed   By: Nolon Nations M.D.   On: 01/04/2015 12:40     EKG Interpretation None      MDM   Final diagnoses:  Healthcare-associated pneumonia  Bronchogenic carcinoma of right lung    CT angio of chest reveals a new soft tissue density in the right perihilar region with occlusion of the right mainstem bronchus which may be related to tumor or infection occluding the bronchus. Additionally there is a new consolidation in the right upper lobe and persistent consolidation of the right lower lobe. Rx IV vancomycin, IV Maxipime. Admit to general medicine.    Nat Christen, MD 01/04/15 1315  Nat Christen, MD 01/04/15 [phone]

## 2015-01-04 NOTE — Progress Notes (Signed)
ANTIBIOTIC CONSULT NOTE - INITIAL  Pharmacy Consult for Vancomycin & Cefepime Indication: pneumonia  No Known Allergies  Patient Measurements: Height: 74 inches Weight : 77.4 kg  Vital Signs: Temp: 98.1 F (36.7 C) (05/01 0931) Temp Source: Oral (05/01 0931) BP: 106/84 mmHg (05/01 0931) Pulse Rate: 97 (05/01 1115) Intake/Output from previous day:   Intake/Output from this shift:    Labs:  Recent Labs  01/02/15 1416 01/04/15 1015  WBC 4.2 6.8  HGB 10.0* 10.8*  PLT 220 210  CREATININE 1.25 1.34*   Estimated Creatinine Clearance: 58.6 mL/min (by C-G formula based on Cr of 1.34). No results for input(s): VANCOTROUGH, VANCOPEAK, VANCORANDOM, GENTTROUGH, GENTPEAK, GENTRANDOM, TOBRATROUGH, TOBRAPEAK, TOBRARND, AMIKACINPEAK, AMIKACINTROU, AMIKACIN in the last 72 hours.   Microbiology: Recent Results (from the past 720 hour(s))  Culture, blood (routine x 2)     Status: None   Collection Time: 12/26/14  5:43 AM  Result Value Ref Range Status   Specimen Description BLOOD LEFT HAND  Final   Special Requests BOTTLES DRAWN AEROBIC AND ANAEROBIC 3ML  Final   Culture   Final    NO GROWTH 5 DAYS Performed at [facility]    Report Status 01/01/2015 FINAL  Final  Culture, blood (routine x 2)     Status: None   Collection Time: 12/26/14  5:45 AM  Result Value Ref Range Status   Specimen Description BLOOD RIGHT ANTECUBITAL  Final   Special Requests BOTTLES DRAWN AEROBIC AND ANAEROBIC 5ML  Final   Culture   Final    NO GROWTH 5 DAYS Performed at [facility]    Report Status 01/01/2015 FINAL  Final    Medical History: Past Medical History  Diagnosis Date  . Syncope   . Dehydration   . Blind left eye   . Hypertension   . Anemia     "low Blood"  . Dementia     poor memory  . Cancer     Lung  . Lung cancer 08/12/14    Right lung    Medications:  Scheduled:   Infusions:  . ceFEPime (MAXIPIME) IV 2 g (01/04/15 1317)  . vancomycin      Assessment:  69 yr male s/p recent treatment for pneumonia.  Presents with c/o of feeling worse despite outpatient antibiotics.  H/O lung cancer  CrCl (n) = 54 ml/min  CT shows new consolidation in right upper lobe; persistent consolidation in right lower lobe and new soft tissue density in right perihilar area  Pharmacy consulted to dose vancomycin and cefepime for pneumonia   Goal of Therapy:  Vancomycin trough level 15-20 mcg/ml  Plan:  Measure antibiotic drug levels at steady state Follow up culture results   Vancomycin 1gm IV x 1 then '750mg'$  IV q12h  Cefepime 2gm IV x 1 then 1gm IV q8h  Eugen Jeansonne, Toribio Harbour, PharmD 01/04/2015,1:17 PM

## 2015-01-04 NOTE — H&P (Signed)
[facility] History and Physical  Stephen Ellis ZYS:[id number] DOB: 1946/07/26 DOA: 01/04/2015  Referring physician: Dr. Lacinda Axon PCP: Elizabeth Palau, MD  Specialists: Spoke to Dr. Nelda Marseille, CCM Spoke to Dr. Marin Olp Oncology  Chief Complaint: Dyspnea  HPI:  69 y/o ? 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  Prior smoker till 2015-ippd x 50 yrs--not on home O2, Anemia of malignancy, Htn He has been Rx 08/2014 for post-obs PNA and underwent at the time Bronch which confirmed   He was admitted again 4/22-4/25/15--Nephew wasn't aware of the Augmentin Rx? It is unclear what the patient was taking on discharge from the hospital at that time Patient resented back to the emergency room for/29 and  and was found to have post-Obt pna again and given prescriptions for amoxicillin to take at home   He has had some SOB and DOE since ~ 1 week prior to ED visit 01/04/15.  He completed his prior Abx and was given Amoxicillin PO bid  Since that time has had ? WOB  nio f/chills Only mild sputum Mild cp started started this morning --this was associated with CP.  He took Tylenol 3 that usually held with the CP under control Appetite is ?   Bun/Creat 17/1.25-->18/1.34 Hb 10 range CT chest=New masses and occlusion R bronchus  ROS -melena, -dark vomit -+Dyruai, no foul smell Passing stool reg No rash, no HA,  +sob     Past Medical History  Diagnosis Date  . Syncope   . Dehydration   . Blind left eye   . Hypertension   . Anemia     "low Blood"  . Dementia     poor memory  . Cancer     Lung  . Lung cancer 08/12/14    Right lung   Past Surgical History  Procedure Laterality Date  . No past surgeries    . Video bronchoscopy Bilateral 07/29/2014    Procedure: VIDEO BRONCHOSCOPY WITHOUT FLUORO;  Surgeon: Tanda Rockers, MD;  Location: [facility] ENDOSCOPY;  Service: Cardiopulmonary;  Laterality:  Bilateral;  . Video bronchoscopy Bilateral 08/12/2014    Procedure: VIDEO BRONCHOSCOPY WITHOUT FLUORO;  Surgeon: Wilhelmina Mcardle, MD;  Location: [facility] ENDOSCOPY;  Service: Cardiopulmonary;  Laterality: Bilateral;   Social History:  History   Social History Narrative   Patient lives with his nephew at home for the past year to year and a half. Patient's nephew is her healthcare power of attorney   Patient has a sister who is also involved in the patient's care    No Known Allergies  Family History  Problem Relation Age of Onset  . Hypertension Brother     Prior to Admission medications   Medication Sig Start Date End Date Taking? Authorizing Provider  albuterol (PROVENTIL) (2.5 MG/3ML) 0.083% nebulizer solution Take 3 mLs (2.5 mg total) by nebulization every 6 (six) hours as needed for wheezing or shortness of breath. 10/19/14  Yes Ezequiel Essex, MD  amLODipine (NORVASC) 10 MG tablet Take 1 tablet (10 mg total) by mouth daily. 10/14/14  Yes Susanne Borders, NP  amoxicillin-clavulanate (AUGMENTIN) 875-125 MG per tablet Take 1 tablet by mouth every 12 (twelve) hours. 01/02/15  Yes Quintella Reichert, MD  budesonide-formoterol [facility]) 160-4.5 MCG/ACT inhaler Inhale 2 puffs into the lungs 2 (two) times daily. 10/14/14  Yes Susanne Borders, NP  ipratropium-albuterol (DUONEB) 0.5-2.5 (3) MG/3ML SOLN Take 3 mLs by nebulization every 6 (six) hours.  Patient taking differently: Take 3 mLs by nebulization every 6 (six) hours as needed (SOB, wheezing).  08/09/14  Yes Thurnell Lose, MD  Multiple Vitamin (MULTIVITAMIN WITH MINERALS) TABS tablet Take 1 tablet by mouth daily.   Yes Historical Provider, MD  Probiotic Product (PROBIOTIC DAILY PO) Take 1 tablet by mouth daily.   Yes Historical Provider, MD  prochlorperazine (COMPAZINE) 10 MG tablet Take 1 tablet (10 mg total) by mouth every 6 (six) hours as needed for nausea or vomiting. Patient not taking: Reported on 01/02/2015 09/22/14   Curt Bears, MD    Physical Exam: Filed Vitals:   01/04/15 1045 01/04/15 1100 01/04/15 1115 01/04/15 1324  BP:    107/83  Pulse: 98 97 97 93  Temp:    98.3 F (36.8 C)  TempSrc:    Oral  Resp: 31 32 20 20  SpO2: 95% 95% 95% 100%    increased work of breathing, , continued cough   Left eye has changes consistent with some type of injury in the past Throat is soft and supple Patient has bitemporal wasting He is frail He is slightly tachycardic I had decreased lung sounds throughout without wheeze Abdomen soft nontender nondistended  Labs on Admission:  Basic Metabolic Panel:  Recent Labs Lab 12/30/14 1002 01/02/15 1416 01/04/15 1015  NA 135* 137 138  K 4.1 4.6 4.0  CL  --  105 108  CO2 19* 24 22  GLUCOSE 120 95 111*  BUN 12.'5 17 18  '$ CREATININE 1.4* 1.25 1.34*  CALCIUM 10.2 9.8 10.0   Liver Function Tests:  Recent Labs Lab 12/30/14 1002  AST 17  ALT 21  ALKPHOS 66  BILITOT 0.22  PROT 9.3*  ALBUMIN 2.6*   No results for input(s): LIPASE, AMYLASE in the last 168 hours. No results for input(s): AMMONIA in the last 168 hours. CBC:  Recent Labs Lab 12/29/14 0450 12/30/14 1001 01/02/15 1416 01/04/15 1015  WBC 10.2 6.6 4.2 6.8  NEUTROABS  --  5.0 2.4 5.4  HGB 9.6* 10.7* 10.0* 10.8*  HCT 28.5* 31.7* 29.9* 32.3*  MCV 100.0 100.5* 101.4* 100.6*  PLT 163 191 220 210   Cardiac Enzymes:  Recent Labs Lab 01/04/15 1015  TROPONINI <0.03    BNP (last 3 results) No results for input(s): BNP in the last 8760 hours.  ProBNP (last 3 results) No results for input(s): PROBNP in the last 8760 hours.  CBG: No results for input(s): GLUCAP in the last 168 hours.  Radiological Exams on Admission: Dg Chest 2 View  01/02/2015   CLINICAL DATA:  69 year old male with cough and shortness of Breath. Current history of lung cancer with obstructing right suprahilar mass. Subsequent encounter.  EXAM: CHEST  2 VIEW  COMPARISON:  Chest CT 12/26/2014 and earlier.  FINDINGS: Mildly lower  lung volumes. Abnormal confluent right lower lobe opacity better demonstrated on 12/26/2014. No associated pleural effusion identified.  Abnormal right mediastinal contour with medial right lung collapse again noted and appears radiographically progressed since 10/19/2014. No pneumothorax or pulmonary edema. Stable left lung. Stable visualized osseous structures.  IMPRESSION: 1. Right lower lobe pneumonia. No pleural effusion identified. See also chest CT 12/26/2014. 2. Right suprahilar lung mass with medial upper lobe collapse appears radiographically progressed since February.   Electronically Signed   By: Genevie Ann M.D.   On: 01/02/2015 14:52   Ct Angio Chest Pe W/cm &/or Wo Cm  01/04/2015   CLINICAL DATA:  Evaluate for pulmonary embolus. Central chest pain.  D-dimer 1.66. Recent treatment for pneumonia. History of lung cancer.  EXAM: CT ANGIOGRAPHY CHEST WITH CONTRAST  TECHNIQUE: Multidetector CT imaging of the chest was performed using the standard protocol during bolus administration of intravenous contrast. Multiplanar CT image reconstructions and MIPs were obtained to evaluate the vascular anatomy.  CONTRAST:  19m OMNIPAQUE IOHEXOL 350 MG/ML SOLN  COMPARISON:  CT of the chest on 12/26/2014  FINDINGS: Heart: Heart is normal in size. No pericardial effusion. No significant coronary artery calcifications.  Vascular structures: Pulmonary arteries are well opacified. No evidence for acute pulmonary embolus. There is poor opacification of the right posterior pulmonary vein. This may be related to shunting or significant consolidation within the right lower lobe. It is not possible to exclude tumor thrombus into the pulmonary vein.  Mediastinum/thyroid: Sub cm low density areas within the right lobe of the thyroid. Subcarinal lymph node is persistent. There is increased density in the right hilar region possibly representing adenopathy or mass. Soft tissue density in the region of the right mainstem bronchus is  2.6 x 2.6 cm.  Lungs/Airways: There is persistent consolidation within the right lower lobe. There is increased density in the right hilar region. There is masslike encroachment upon the right mainstem bronchus. New consolidation is identified within the right upper lobe. The left mainstem bronchus and left lung remain clear.  Upper abdomen: Unremarkable.  Chest wall/osseous structures: No suspicious lytic or blastic lesions are identified.  Review of the MIP images confirms the above findings.  IMPRESSION: 1. Technically adequate exam showing no pulmonary embolus. 2. New soft tissue density in the right perihilar region with occlusion of the right mainstem bronchus. Findings may be related to tumor or infection occluding the bronchus. There is new associated consolidation of the right upper lobe and persistent consolidation of the right lower lobe accounting for the patient's symptoms. 3. Tumor thrombus versus poor opacification of the right posterior pulmonary vein. See above.   Electronically Signed   By: ENolon NationsM.D.   On: 01/04/2015 12:40    EKG: Independently reviewed. EKG confirms sinus tachycardia, there are some nonspecific ST-T wave elevations probably related to the rate  Assessment/Plan Principal Problem:   Lung cancer, main bronchus-I discussed the patient's images with pulmonary critical care who confirmed that this is a new spread of cancer invading this area. Per Dr. YNelda Marseille these findings have been present probably since January- But may have spread since then. Patient is relatively hypoxic and coughing continuously He remains a full CODE STATUS so we will ask critical care to see the patient in consult for assistance in terms of respiratory management--might require Bipap depndant on ABG Although he does not have a wheeze, we could try Solu-Medrol 80 every 6 hourly Keep NPO for now I had a direct conversation with the nephew who confirms that if patient was to get sicker than  he would want to be intubated. Patient may benefit from XRT to see if this area is amenable to Rx- Dr. MTammi Klippelbelieves patient may benefit from XRT and will see patient in consult 01/05/15 Dr. MEarlie Serverwill see the patient in consult in am    Postobstructive pneumonia- we will start Zosyn IV for now.  Cycle cbc + diff in am   Acute respiratory failure with hypoxia-see above discussion-Get ABG.  Appreciate consultant input in advance   Anemia of malignancy-stable     AKI (acute kidney injury)-mild-Start saline 75 cc.hour for now   I spent 45 min separate from direct  patient care coordinating this patient's care between multiple consultants Full code Stepdown   Time spent: 95 minutes  Verlon Au [facility] Pager [phone]  If 7PM-7AM, please contact night-coverage [email or web address] Password [email or web address] 01/04/2015, 1:50 PM

## 2015-01-04 NOTE — ED Notes (Signed)
Dr Verlon Au at bedside

## 2015-01-04 NOTE — ED Notes (Signed)
Suction setup for pt to suction mucous when he coughs

## 2015-01-04 NOTE — Progress Notes (Signed)
ANTIBIOTIC CONSULT NOTE - INITIAL  Pharmacy Consult for Zosyn Indication: pneumonia  No Known Allergies  Patient Measurements: Height: 74 inches Weight : 77.4 kg  Vital Signs: Temp: 98.3 F (36.8 C) (05/01 1324) Temp Source: Oral (05/01 1324) BP: 107/83 mmHg (05/01 1324) Pulse Rate: 107 (05/01 1415) Intake/Output from previous day:   Intake/Output from this shift:    Labs:  Recent Labs  01/02/15 1416 01/04/15 1015  WBC 4.2 6.8  HGB 10.0* 10.8*  PLT 220 210  CREATININE 1.25 1.34*   Estimated Creatinine Clearance: 58.6 mL/min (by C-G formula based on Cr of 1.34). No results for input(s): VANCOTROUGH, VANCOPEAK, VANCORANDOM, GENTTROUGH, GENTPEAK, GENTRANDOM, TOBRATROUGH, TOBRAPEAK, TOBRARND, AMIKACINPEAK, AMIKACINTROU, AMIKACIN in the last 72 hours.   Microbiology: Recent Results (from the past 720 hour(s))  Culture, blood (routine x 2)     Status: None   Collection Time: 12/26/14  5:43 AM  Result Value Ref Range Status   Specimen Description BLOOD LEFT HAND  Final   Special Requests BOTTLES DRAWN AEROBIC AND ANAEROBIC 3ML  Final   Culture   Final    NO GROWTH 5 DAYS Performed at [facility]    Report Status 01/01/2015 FINAL  Final  Culture, blood (routine x 2)     Status: None   Collection Time: 12/26/14  5:45 AM  Result Value Ref Range Status   Specimen Description BLOOD RIGHT ANTECUBITAL  Final   Special Requests BOTTLES DRAWN AEROBIC AND ANAEROBIC 5ML  Final   Culture   Final    NO GROWTH 5 DAYS Performed at [facility]    Report Status 01/01/2015 FINAL  Final    Medical History: Past Medical History  Diagnosis Date  . Syncope   . Dehydration   . Blind left eye   . Hypertension   . Anemia     "low Blood"  . Dementia     poor memory  . Cancer     Lung  . Lung cancer 08/12/14    Right lung    Medications:  Scheduled:  . methylPREDNISolone (SOLU-MEDROL) injection  80 mg Intravenous Q6H   Infusions:  . sodium chloride     . piperacillin-tazobactam (ZOSYN)  IV     Assessment:  69 yr male s/p recent treatment for pneumonia.  Presents with c/o of feeling worse despite outpatient antibiotics.  H/O lung cancer  CrCl (n) = 54 ml/min  CT shows new consolidation in right upper lobe; persistent consolidation in right lower lobe and new soft tissue density in right perihilar area  Pharmacy initially consulted to dose vancomycin and cefepime for pneumonia; upon admission antibiotics changed:  Cefepime and Vancomycin d/c'ed and patient to receive Zosyn with pharmacy dosing  Patient received Vancomycin 1gm IV x 1 @ 13:53 and Cefepime 2gm IV x 1 @ 13:17   Goal of Therapy:  Eradication of infection  Plan:  Measure antibiotic drug levels at steady state Follow up culture results   Zosyn 3.375gm IV q8h (each dose infused over 4 hrs)  Brailon Don, Toribio Harbour, PharmD 01/04/2015,2:55 PM

## 2015-01-04 NOTE — Progress Notes (Signed)
Hand off report received from Manuela Schwartz, [place].

## 2015-01-05 ENCOUNTER — Encounter (HOSPITAL_COMMUNITY): Payer: Self-pay | Admitting: *Deleted

## 2015-01-05 ENCOUNTER — Ambulatory Visit
Admit: 2015-01-05 | Discharge: 2015-01-05 | Disposition: A | Discharge status: Home / Self Care | Payer: Medicare HMO | Attending: Radiation Oncology | Admitting: Radiation Oncology

## 2015-01-05 ENCOUNTER — Ambulatory Visit: Payer: Medicare HMO | Admitting: Oncology

## 2015-01-05 ENCOUNTER — Other Ambulatory Visit: Payer: Medicare HMO

## 2015-01-05 ENCOUNTER — Encounter: Payer: Self-pay | Admitting: Radiation Oncology

## 2015-01-05 DIAGNOSIS — C3491 Malignant neoplasm of unspecified part of right bronchus or lung: Secondary | ICD-10-CM

## 2015-01-05 LAB — CBC
HCT: 31.5 % — ABNORMAL LOW (ref 39.0–52.0)
Hemoglobin: 10.3 g/dL — ABNORMAL LOW (ref 13.0–17.0)
MCH: 33.1 pg (ref 26.0–34.0)
MCHC: 32.7 g/dL (ref 30.0–36.0)
MCV: 101.3 fL — ABNORMAL HIGH (ref 78.0–100.0)
PLATELETS: 186 10*3/uL (ref 150–400)
RBC: 3.11 MIL/uL — AB (ref 4.22–5.81)
RDW: 14.4 % (ref 11.5–15.5)
WBC: 16.2 10*3/uL — ABNORMAL HIGH (ref 4.0–10.5)

## 2015-01-05 LAB — COMPREHENSIVE METABOLIC PANEL
ALT: 25 U/L (ref 17–63)
AST: 17 U/L (ref 15–41)
Albumin: 2.7 g/dL — ABNORMAL LOW (ref 3.5–5.0)
Alkaline Phosphatase: 49 U/L (ref 38–126)
Anion gap: 9 (ref 5–15)
BILIRUBIN TOTAL: 0.8 mg/dL (ref 0.3–1.2)
BUN: 26 mg/dL — AB (ref 6–20)
CHLORIDE: 108 mmol/L (ref 101–111)
CO2: 21 mmol/L — ABNORMAL LOW (ref 22–32)
Calcium: 9.8 mg/dL (ref 8.9–10.3)
Creatinine, Ser: 2.06 mg/dL — ABNORMAL HIGH (ref 0.61–1.24)
GFR calc Af Amer: 37 mL/min — ABNORMAL LOW (ref >60)
GFR calc non Af Amer: 32 mL/min — ABNORMAL LOW (ref >60)
Glucose, Bld: 155 mg/dL — ABNORMAL HIGH (ref 70–99)
POTASSIUM: 4.4 mmol/L (ref 3.5–5.1)
Sodium: 138 mmol/L (ref 135–145)
TOTAL PROTEIN: 8.6 g/dL — AB (ref 6.5–8.1)

## 2015-01-05 LAB — PROTIME-INR
INR: 1.29 (ref 0.00–1.49)
Prothrombin Time: 16.2 seconds — ABNORMAL HIGH (ref 11.6–15.2)

## 2015-01-05 NOTE — Progress Notes (Signed)
Stephen Ellis VHQ:[id number] DOB: 1945-11-05 DOA: 01/04/2015 PCP: Elizabeth Palau, MD  Brief narrative: 69 y/o ? 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 Prior smoker till 2015-ippd x 50 yrs--not on home O2, Anemia of malignancy, Htn He has been Rx 08/2014 for post-obs PNA and underwent at the time Bronch which confirmed   Due to ? SOB, wheeze and chest discomfort, came to Ed While in Ed desaturated and admitted O/N to SDU  Past medical history-As per Problem list Chart reviewed as below- reviewed  Consultants:  Rad-ONC  Med-ONc aware  Pulm telephone curbsided on admit  Procedures:  none  Antibiotics:  Zosyn 5/2    Subjective  Better Starrving as was NPO No other issues like CP, N,V    Objective    Interim History:  Telemetry: sinus   Objective: Filed Vitals:   01/05/15 0411 01/05/15 0500 01/05/15 0600 01/05/15 0800  BP: 106/73  93/76   Pulse:  90 88   Temp:    98.1 F (36.7 C)  TempSrc:    Oral  Resp: _0 Height:      Weight:      SpO2:  100% 100%     Intake/Output Summary (Last 24 hours) at 01/05/15 0823 Last data filed at 01/05/15 0600  Gross per 24 hour  Intake    900 ml  Output    300 ml  Net    600 ml    Exam:  General: L eye post op  Cardiovascular: s1 s 2no m/r/g Respiratory: clear-no wheeze Abdomen: soft, NT, ND Skinno LE edema Neuro intact  Data Reviewed: Basic Metabolic Panel:  Recent Labs Lab 12/30/14 1002  01/02/15 1416 01/04/15 1015 01/05/15 0354  NA 135*  --  137 138 138  K 4.1  < > 4.6 4.0 4.4  CL  --   --  105 108 108  CO2 19*  --  24 22 21*  GLUCOSE 120  --  95 111* 155*  BUN 12.5  --  17 18 26*  CREATININE 1.4*  --  1.25 1.34* 2.06*  CALCIUM 10.2  --  9.8 10.0 9.8  < > = values in this interval not displayed. Liver Function Tests:  Recent Labs Lab 12/30/14 1002 01/05/15 0354  AST 17 17  ALT 21 25    ALKPHOS 66 49  BILITOT 0.22 0.8  PROT 9.3* 8.6*  ALBUMIN 2.6* 2.7*   No results for input(s): LIPASE, AMYLASE in the last 168 hours. No results for input(s): AMMONIA in the last 168 hours. CBC:  Recent Labs Lab 12/30/14 1001 01/02/15 1416 01/04/15 1015 01/05/15 0354  WBC 6.6 4.2 6.8 16.2*  NEUTROABS 5.0 2.4 5.4  --   HGB 10.7* 10.0* 10.8* 10.3*  HCT 31.7* 29.9* 32.3* 31.5*  MCV 100.5* 101.4* 100.6* 101.3*  PLT 191 220 210 186   Cardiac Enzymes:  Recent Labs Lab 01/04/15 1015  TROPONINI <0.03   BNP: Invalid input(s): POCBNP CBG: No results for input(s): GLUCAP in the last 168 hours.  Recent Results (from the past 240 hour(s))  MRSA PCR Screening     Status: None   Collection Time: 01/04/15  4:11 PM  Result Value Ref Range Status   MRSA by PCR NEGATIVE NEGATIVE Final    Comment:        The GeneXpert MRSA Assay (FDA approved for NASAL specimens only), is one component of a comprehensive MRSA  colonization surveillance program. It is not intended to diagnose MRSA infection nor to guide or monitor treatment for MRSA infections.      Studies:              All Imaging reviewed and is as per above notation   Scheduled Meds: . budesonide-formoterol  2 puff Inhalation BID  . heparin  5,000 Units Subcutaneous 3 times per day  . methylPREDNISolone (SOLU-MEDROL) injection  80 mg Intravenous Q6H  . piperacillin-tazobactam (ZOSYN)  IV  3.375 g Intravenous Q8H   Continuous Infusions: . sodium chloride 75 mL/hr at 01/04/15 2300     Assessment/Plan:  1. Acute resp fail 2/2 to Progression Lung Ca +Post-obst PNA-Appreciate Oncology assistance-for XRT this admit?  Continue empiric Zosyn for now.  Continue Empiric Solu-medrol with quick taper in 1-2 days.  CBC + diff in am.  From my standpoint is safe to eat currently and we will start a diet 2. 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 COPD-Continue Inhalers with budesonide 4. AKI-Renal  function worsening-continue IV saline 75 cc/hr-rpt labs in am 5. Mild Met acidosis-2/2 to #3-monitor 6. Anemia of malignancy-Monitor trends   Code Status: Full Family Communication: LM with kenneth on his Cell Disposition Plan: needs XRT and then potential slow taper Abx   Verneita Griffes, MD  [facility] Pager [phone] 01/05/2015, 8:23 AM    LOS: 1 day

## 2015-01-05 NOTE — Progress Notes (Signed)
  Radiation Oncology         [phone] ________________________________  Name: Stephen Ellis MRN: [id number]  Date: 01/04/2015  DOB: Oct 07, 1945  INPATIENT  SIMULATION AND TREATMENT PLANNING NOTE    ICD-9-CM ICD-10-CM   1. Healthcare-associated pneumonia 486 J18.9   2. Bronchogenic carcinoma of right lung 162.9 C34.91     DIAGNOSIS:  69 year old gentleman with recurrent neuroendocrine carcinoma right upper lung with postobstructive pneumonia  NARRATIVE:  The patient was brought to the [place].  Identity was confirmed.  All relevant records and images related to the planned course of therapy were reviewed.  The patient freely provided informed written consent to proceed with treatment after reviewing the details related to the planned course of therapy. The consent form was witnessed and verified by the simulation staff.  Then, the patient was set-up in a stable reproducible  supine position for radiation therapy.  CT images were obtained.  Surface markings were placed.  The CT images were loaded into the planning software.  Then the target and avoidance structures were contoured.  Treatment planning then occurred.  The radiation prescription was entered and confirmed.  Then, I designed and supervised the construction of a total of 5 medically necessary complex treatment devices including multileaf collimator apertures from the anterior, posterior, left anterior oblique, right posterior oblique, and right partial ARC shielding radiation from critical structures including the lungs heart and spinal cord while covering the target volume..  I have requested : 3D Simulation  I have requested a DVH of the following structures: Left lung, right lung, spinal cord, heart, esophagus, and target volumes.  I have ordered:Nutrition Consult  PLAN:  The patient will receive 35 Gy in 14 fractions.  ________________________________  Sheral Apley Tammi Klippel, M.D.

## 2015-01-05 NOTE — Care Management Note (Signed)
Case Management Note  Patient Details  Name: ADRICK KESTLER MRN: [id number] Date of Birth: [birth year]  Subjective/Objective:        PNA recurrent            Action/Plan:   Home    Expected Discharge Date:       [id number]           Expected Discharge Plan:     In-House Referral:     Discharge planning Services     Post Acute Care Choice:    Choice offered to:     DME Arranged:    DME Agency:     HH Arranged:    [place] Agency:     Status of Service:     Medicare Important Message Given:    Date Medicare IM Given:    Medicare IM give by:    Date Additional Medicare IM Given:    Additional Medicare Important Message give by:     If discussed at [place] of Stay Meetings, dates discussed:    Additional Comments:  Leeroy Cha, RN 01/05/2015, 10:42 AM

## 2015-01-05 NOTE — Consult Note (Signed)
Radiation Oncology         [phone]) [phone] ________________________________  Initial inpatient established patient visit  Name: Stephen Ellis MRN: [id number]  Date: 01/04/2015  DOB: 03-22-1946  LP:[id number] VINCENT, MD  No ref. provider found   REFERRING PHYSICIAN: Dr. 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 Healthcare-associated pneumonia 486 J18.9   2. Bronchogenic carcinoma of right lung 162.9 C34.91     HISTORY OF PRESENT ILLNESS::Stephen Ellis is a 69 y.o. male who was diagnosed with a large central right upper lung mass with mediastinal and bilateral hilar lymphadenopathy in December 2015. Bronchoscopy at that time revealed cancer with neuroendocrine differentiation. Because of presenting postobstructive pneumonia, the patient received a single fraction of 8 Gy from my colleague, Dr. Pablo Ledger on 09/04/2014. Since that time, the patient has been receiving carboplatin with Taxol. His course has been notable for recurrences of postobstructive pneumonia. He was admitted to the hospital last week with cough and shortness of breath. Repeat CT confirms persistent postobstructive pneumonia. The patient has, been referred today to discuss possible reirradiation to the chest for the purpose of palliation.  PREVIOUS RADIATION THERAPY: Yes as above  PAST MEDICAL HISTORY:  has a past medical history of Syncope; Dehydration; Blind left eye; Hypertension; Anemia; Dementia; Cancer; and Lung cancer (08/12/14).    PAST SURGICAL HISTORY: Past Surgical History  Procedure Laterality Date  . No past surgeries    . Video bronchoscopy Bilateral 07/29/2014    Procedure: VIDEO BRONCHOSCOPY WITHOUT FLUORO;  Surgeon: Tanda Rockers, MD;  Location: [facility] ENDOSCOPY;  Service: Cardiopulmonary;  Laterality: Bilateral;  . Video bronchoscopy Bilateral 08/12/2014    Procedure: VIDEO  BRONCHOSCOPY WITHOUT FLUORO;  Surgeon: Wilhelmina Mcardle, MD;  Location: [facility] ENDOSCOPY;  Service: Cardiopulmonary;  Laterality: Bilateral;    FAMILY HISTORY: family history includes Hypertension in his brother.  SOCIAL HISTORY:  reports that he quit smoking about 6 months ago. He has never used smokeless tobacco. He reports that he does not drink alcohol or use illicit drugs.  ALLERGIES: Review of patient's allergies indicates no known allergies.  MEDICATIONS:  Current Facility-Administered Medications  Medication Dose Route Frequency Provider Last Rate Last Dose  . 0.9 %  sodium chloride infusion   Intravenous Continuous Nita Sells, MD 75 mL/hr at 01/04/15 2300    . albuterol (PROVENTIL) (2.5 MG/3ML) 0.083% nebulizer solution 2.5 mg  2.5 mg Nebulization Q6H PRN Nita Sells, MD      . budesonide-formoterol (SYMBICORT) 160-4.5 MCG/ACT inhaler 2 puff  2 puff Inhalation BID Nita Sells, MD   2 puff at 01/04/15 2007  . heparin injection 5,000 Units  5,000 Units Subcutaneous 3 times per day Nita Sells, MD   5,000 Units at 01/05/15 0600  . methylPREDNISolone sodium succinate (SOLU-MEDROL) 125 mg/2 mL injection 80 mg  80 mg Intravenous Q6H Nita Sells, MD   80 mg at 01/05/15 0300  . piperacillin-tazobactam (ZOSYN) IVPB 3.375 g  3.375 g Intravenous Q8H Leann T Poindexter, RPH   3.375 g at 01/05/15 0600  . prochlorperazine (COMPAZINE) tablet 10 mg  10 mg Oral Q6H PRN Nita Sells, MD        REVIEW OF SYSTEMS:  A 15 point review of systems is documented in the electronic medical record. This was obtained by the nursing staff. However, I reviewed this with the patient to discuss relevant findings and make appropriate changes.  Pertinent items are  noted in HPI.   PHYSICAL EXAM:  height is 6' (1.829 m) and weight is 163 lb 9.3 oz (74.2 kg). His oral temperature is 98.1 F (36.7 C). His blood pressure is 93/76 and his pulse is 88. His respiration is 30 and  oxygen saturation is 100%.   Per his hospitalist:  Filed Vitals:   01/05/15 0411 01/05/15 0500 01/05/15 0600 01/05/15 0800  BP: 106/73  93/76   Pulse:  90 88   Temp:    98.1 F (36.7 C)  TempSrc:    Oral  Resp: '27 21 30   '$ Height:      Weight:      SpO2:  100% 100%     Intake/Output Summary (Last 24 hours) at 01/05/15 7253 Last data filed at 01/05/15 0600  Gross per 24 hour  Intake  900 ml  Output  300 ml  Net  600 ml    Exam:  General: L eye post op  Cardiovascular: s1 s 2no m/r/g Respiratory: clear-no wheeze Abdomen: soft, NT, ND Skinno LE edema Neuro intact       KPS = 50  100 - Normal; no complaints; no evidence of disease. 90   - Able to carry on normal activity; minor signs or symptoms of disease. 80   - Normal activity with effort; some signs or symptoms of disease. 38   - Cares for self; unable to carry on normal activity or to do active work. 60   - Requires occasional assistance, but is able to care for most of his personal needs. 50   - Requires considerable assistance and frequent medical care. 63   - Disabled; requires special care and assistance. 72   - Severely disabled; hospital admission is indicated although death not imminent. 72   - Very sick; hospital admission necessary; active supportive treatment necessary. 10   - Moribund; fatal processes progressing rapidly. 0     - Dead  Karnofsky DA, Abelmann [place], Craver LS and Burchenal [facility] [phone]) The use of the nitrogen mustards in the palliative treatment of carcinoma: with particular reference to bronchogenic carcinoma Cancer 1 634-56  LABORATORY DATA:  Lab Results  Component Value Date   WBC 16.2* 01/05/2015   HGB 10.3* 01/05/2015   HCT 31.5* 01/05/2015   MCV 101.3* 01/05/2015   PLT 186 01/05/2015   Lab Results  Component Value Date   NA 138 01/05/2015   K 4.4 01/05/2015   CL 108 01/05/2015   CO2 21* 01/05/2015   Lab Results    Component Value Date   ALT 25 01/05/2015   AST 17 01/05/2015   ALKPHOS 49 01/05/2015   BILITOT 0.8 01/05/2015     RADIOGRAPHY: Dg Chest 2 View  01/02/2015   CLINICAL DATA:  69 year old male with cough and shortness of Breath. Current history of lung cancer with obstructing right suprahilar mass. Subsequent encounter.  EXAM: CHEST  2 VIEW  COMPARISON:  Chest CT 12/26/2014 and earlier.  FINDINGS: Mildly lower lung volumes. Abnormal confluent right lower lobe opacity better demonstrated on 12/26/2014. No associated pleural effusion identified.  Abnormal right mediastinal contour with medial right lung collapse again noted and appears radiographically progressed since 10/19/2014. No pneumothorax or pulmonary edema. Stable left lung. Stable visualized osseous structures.  IMPRESSION: 1. Right lower lobe pneumonia. No pleural effusion identified. See also chest CT 12/26/2014. 2. Right suprahilar lung mass with medial upper lobe collapse appears radiographically progressed since February.   Electronically Signed   By: Genevie Ann  M.D.   On: 01/02/2015 14:52   Ct Chest Wo Contrast  12/26/2014   CLINICAL DATA:  Nonproductive cough for 3 days. Ongoing chemotherapy and radiation for right lung cancer.  EXAM: CT CHEST WITHOUT CONTRAST  TECHNIQUE: Multidetector CT imaging of the chest was performed following the standard protocol without IV contrast.  COMPARISON:  11/21/2014  FINDINGS: CT CHEST FINDINGS  Mediastinum/Nodes: Heart is normal size. Aorta is normal caliber. 1.4 cm subcarinal lymph node is stable. Other small scattered subcentimeric lymph nodes in the mediastinum. No axillary adenopathy. No visible hilar adenopathy on this unenhanced study.  Lungs/Pleura: Continued postobstructive collapse of the right upper lobe. Obstructing central right upper lobe mass again noted, grossly unchanged, difficult to measure exactly will without intravenous contrast and due to the adjacent collapsed right upper lobe. New area  of consolidation in the right lower lobe compatible with pneumonia. No pleural effusions.  Musculoskeletal: Chest wall soft tissues are unremarkable. No acute bony abnormality or focal bone lesion.  Upper abdomen: Imaging into the upper abdomen shows no acute findings.  IMPRESSION: New area consolidation in the right lower lobe compatible with pneumonia.  Stable central obstructing right upper lobe mass with right upper lobe collapse. Stable subcarinal adenopathy.   Electronically Signed   By: Rolm Baptise M.D.   On: 12/26/2014 04:55   Ct Angio Chest Pe W/cm &/or Wo Cm  01/04/2015   CLINICAL DATA:  Evaluate for pulmonary embolus. Central chest pain. D-dimer 1.66. Recent treatment for pneumonia. History of lung cancer.  EXAM: CT ANGIOGRAPHY CHEST WITH CONTRAST  TECHNIQUE: Multidetector CT imaging of the chest was performed using the standard protocol during bolus administration of intravenous contrast. Multiplanar CT image reconstructions and MIPs were obtained to evaluate the vascular anatomy.  CONTRAST:  137m OMNIPAQUE IOHEXOL 350 MG/ML SOLN  COMPARISON:  CT of the chest on 12/26/2014  FINDINGS: Heart: Heart is normal in size. No pericardial effusion. No significant coronary artery calcifications.  Vascular structures: Pulmonary arteries are well opacified. No evidence for acute pulmonary embolus. There is poor opacification of the right posterior pulmonary vein. This may be related to shunting or significant consolidation within the right lower lobe. It is not possible to exclude tumor thrombus into the pulmonary vein.  Mediastinum/thyroid: Sub cm low density areas within the right lobe of the thyroid. Subcarinal lymph node is persistent. There is increased density in the right hilar region possibly representing adenopathy or mass. Soft tissue density in the region of the right mainstem bronchus is 2.6 x 2.6 cm.  Lungs/Airways: There is persistent consolidation within the right lower lobe. There is increased  density in the right hilar region. There is masslike encroachment upon the right mainstem bronchus. New consolidation is identified within the right upper lobe. The left mainstem bronchus and left lung remain clear.  Upper abdomen: Unremarkable.  Chest wall/osseous structures: No suspicious lytic or blastic lesions are identified.  Review of the MIP images confirms the above findings.  IMPRESSION: 1. Technically adequate exam showing no pulmonary embolus. 2. New soft tissue density in the right perihilar region with occlusion of the right mainstem bronchus. Findings may be related to tumor or infection occluding the bronchus. There is new associated consolidation of the right upper lobe and persistent consolidation of the right lower lobe accounting for the patient's symptoms. 3. Tumor thrombus versus poor opacification of the right posterior pulmonary vein. See above.   Electronically Signed   By: ENolon NationsM.D.   On: 01/04/2015 12:40  IMPRESSION: This patient is a very nice 69 year old gentleman with recurrent neuroendocrine carcinoma right upper lung with postobstructive pneumonia. He may benefit from an abbreviated course of palliative radiation for palliation of postobstructive pneumonia and prevention of further airway compromise.  PLAN:Today, I talked to the patient and his brother Stephen Ellis by phone about the findings and work-up thus far.  We discussed the natural history of recurrent lung cancer and postobstructive pneumonia and general treatment, highlighting the role of radiotherapy in the management.  We discussed the available radiation techniques, and focused on the details of logistics and delivery.  We reviewed the anticipated acute and late sequelae associated with radiation in this setting.  The patient was encouraged to ask questions that I answered to the best of my ability.  The patient would like to proceed with radiation and will be scheduled for CT simulation.  I spent 30  minutes minutes face to face with the patient and more than 50% of that time was spent in counseling and/or coordination of care.   ------------------------------------------------  Sheral Apley. Tammi Klippel, M.D.

## 2015-01-05 NOTE — Progress Notes (Signed)
Jan 05, 2015/Levina Boyack L. Rosana Hoes, RN, BSN, CCM. Case Management [place] [phone] No discharge needs present of time of review.

## 2015-01-05 NOTE — Progress Notes (Signed)
[facility]  Patient Status: Active  [facility] is providing the following services: HHRN/PT/OT  If patient discharges after hours, please call [phone].   [name] 01/05/2015, 11:34 AM

## 2015-01-05 NOTE — Care Management Note (Signed)
Case Management Note  Patient Details  Name: SHLOIMY MICHALSKI MRN: [id number] Date of Birth: 07-06-1946  Subjective/Objective:    69 y/o ? 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  Prior smoker till 2015-ippd x 50 yrs--not on home O2, Anemia of malignancy, Htn He has been Rx 08/2014 for post-obs PNA and underwent at the time Bronch which confirmed    He was admitted again 4/22-4/25/15--Nephew wasn't aware of the Augmentin Rx? It is unclear what the patient was taking on discharge from the hospital at that time Patient resented back to the emergency room for/29 and  and was found to have post-Obt pna again and given prescriptions for amoxicillin to take at home. He has had some SOB and DOE since ~ 1 week prior to ED visit 01/04/15.  He completed his prior Abx and was given Amoxicillin PO bid  Since that time has had ? WOB  nio f/chills Only mild sputum Mild cp started started this morning --this was associated with CP.  He took Tylenol 3 that usually held with the CP under control Appetite is ? Bun/Creat 17/1.25-->18/1.34 Hb 10 range CT chest=New masses and occlusion R bronchus              Mohammedali, Bedoy #[id number] (CSN: [id number]) (69 y.o. M) (Adm: 01/04/15)    [id number]      Vital Sign Min/Max (last 24 hours)    Value Min Max   Temp 98.1 F (36.7 C) 101 F (38.3 C)   Pulse Rate 37 110   ECG Heart Rate 87 123   Resp 9 48   BP: Systolic 89 mmHg 060 mmHg   BP: Diastolic 54 mmHg 90 mmHg   SpO2 87 % 100 %       Action/Plan:   Expected Discharge Date:                  Expected Discharge Plan:     In-House Referral:     Discharge planning Services     Post Acute Care Choice:    Choice offered to:     DME Arranged:    DME Agency:     HH Arranged:    HH Agency:     Status of Service:     Medicare Important Message Given:    Date Medicare IM Given:    Medicare IM  give by:    Date Additional Medicare IM Given:    Additional Medicare Important Message give by:     If discussed at [place] of Stay Meetings, dates discussed:    Additional Comments:  Leeroy Cha, RN 01/05/2015, 10:35 AM

## 2015-01-06 ENCOUNTER — Ambulatory Visit: Payer: Medicare HMO

## 2015-01-06 ENCOUNTER — Ambulatory Visit
Admit: 2015-01-06 | Discharge: 2015-01-06 | Disposition: A | Discharge status: Home / Self Care | Payer: Medicare HMO | Attending: Radiation Oncology | Admitting: Radiation Oncology

## 2015-01-06 DIAGNOSIS — D72819 Decreased white blood cell count, unspecified: Secondary | ICD-10-CM

## 2015-01-06 DIAGNOSIS — C3410 Malignant neoplasm of upper lobe, unspecified bronchus or lung: Secondary | ICD-10-CM

## 2015-01-06 DIAGNOSIS — J9600 Acute respiratory failure, unspecified whether with hypoxia or hypercapnia: Secondary | ICD-10-CM

## 2015-01-06 LAB — BASIC METABOLIC PANEL
Anion gap: 5 (ref 5–15)
BUN: 28 mg/dL — ABNORMAL HIGH (ref 6–20)
CALCIUM: 9.4 mg/dL (ref 8.9–10.3)
CO2: 21 mmol/L — ABNORMAL LOW (ref 22–32)
Chloride: 108 mmol/L (ref 101–111)
Creatinine, Ser: 1.47 mg/dL — ABNORMAL HIGH (ref 0.61–1.24)
GFR, EST AFRICAN AMERICAN: 55 mL/min — AB (ref >60)
GFR, EST NON AFRICAN AMERICAN: 48 mL/min — AB (ref >60)
Glucose, Bld: 162 mg/dL — ABNORMAL HIGH (ref 70–99)
POTASSIUM: 4 mmol/L (ref 3.5–5.1)
SODIUM: 134 mmol/L — AB (ref 135–145)

## 2015-01-06 MED ORDER — PREDNISONE 10 MG PO TABS
60.0000 mg | ORAL_TABLET | Freq: Every day | ORAL | Status: DC
  Administered 2015-01-07 – 2015-01-09 (×3): 60 mg via ORAL
  Filled 2015-01-06 (×3): qty 1

## 2015-01-06 MED ORDER — AMOXICILLIN-POT CLAVULANATE 875-125 MG PO TABS
1.0000 | ORAL_TABLET | Freq: Two times a day (BID) | ORAL | Status: DC
  Administered 2015-01-06 – 2015-01-09 (×7): 1 via ORAL
  Filled 2015-01-06 (×7): qty 1

## 2015-01-06 NOTE — Progress Notes (Signed)
Stephen Ellis DTO:[id number] DOB: 03-01-1946 DOA: 01/04/2015 PCP: Elizabeth Palau, MD  Brief narrative: 69 y/o ? 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 Prior smoker till 2015-ippd x 50 yrs--not on home O2, Anemia of malignancy, Htn He has been Rx 08/2014 for post-obs PNA and underwent at the time Bronch which confirmed   Due to ? SOB, wheeze and chest discomfort, came to Ed While in Ed desaturated and admitted O/N to SDU  Past medical history-As per Problem list Chart reviewed as below- reviewed  Consultants:  Rad-ONC  Med-ONc aware  Pulm telephone curbsided on admit  Procedures:  none  Antibiotics:  Zosyn 5/2    Subjective   Doing fair no discomfort or chills or fever Looks and feels much better he states No nausea or vomiting    Objective    Interim History:  Telemetry: sinus   Objective: Filed Vitals:   01/05/15 1400 01/05/15 2102 01/06/15 0503 01/06/15 0749  BP: 115/79 125/83 120/67   Pulse:  74 65   Temp:  97.9 F (36.6 C) 97.3 F (36.3 C)   TempSrc:  Oral Oral   Resp: 32 18 18   Height:      Weight:      SpO2:  98% 99% 97%    Intake/Output Summary (Last 24 hours) at 01/06/15 1206 Last data filed at 01/06/15 1025  Gross per 24 hour  Intake   2005 ml  Output    600 ml  Net   1405 ml    Exam:  General: L eye post op  Cardiovascular: s1 s 2no m/r/g Respiratory: clear-no wheeze Abdomen: soft, NT, ND Skinno LE edema Neuro intact  Data Reviewed: Basic Metabolic Panel:  Recent Labs Lab 01/02/15 1416 01/04/15 1015 01/05/15 0354  NA 137 138 138  K 4.6 4.0 4.4  CL 105 108 108  CO2 24 22 21*  GLUCOSE 95 111* 155*  BUN 17 18 26*  CREATININE 1.25 1.34* 2.06*  CALCIUM 9.8 10.0 9.8   Liver Function Tests:  Recent Labs Lab 01/05/15 0354  AST 17  ALT 25  ALKPHOS 49  BILITOT 0.8  PROT 8.6*  ALBUMIN 2.7*   No results for  input(s): LIPASE, AMYLASE in the last 168 hours. No results for input(s): AMMONIA in the last 168 hours. CBC:  Recent Labs Lab 01/02/15 1416 01/04/15 1015 01/05/15 0354  WBC 4.2 6.8 16.2*  NEUTROABS 2.4 5.4  --   HGB 10.0* 10.8* 10.3*  HCT 29.9* 32.3* 31.5*  MCV 101.4* 100.6* 101.3*  PLT 220 210 186   Cardiac Enzymes:  Recent Labs Lab 01/04/15 1015  TROPONINI <0.03   BNP: Invalid input(s): POCBNP CBG: No results for input(s): GLUCAP in the last 168 hours.  Recent Results (from the past 240 hour(s))  MRSA PCR Screening     Status: None   Collection Time: 01/04/15  4:11 PM  Result Value Ref Range Status   MRSA by PCR NEGATIVE NEGATIVE Final    Comment:        The GeneXpert MRSA Assay (FDA approved for NASAL specimens only), is one component of a comprehensive MRSA colonization surveillance program. It is not intended to diagnose MRSA infection nor to guide or monitor treatment for MRSA infections.      Studies:              All Imaging reviewed and is as per above notation   Scheduled Meds: .  budesonide-formoterol  2 puff Inhalation BID  . heparin  5,000 Units Subcutaneous 3 times per day  . methylPREDNISolone (SOLU-MEDROL) injection  80 mg Intravenous Q6H  . piperacillin-tazobactam (ZOSYN)  IV  3.375 g Intravenous Q8H   Continuous Infusions: . sodium chloride 75 mL/hr at 01/06/15 0109     Assessment/Plan:  1. Acute resp fail 2/2 to Progression Lung Ca +Post-obst PNA-Appreciate Oncology assistance-for XRT this admit?   Zosyn-->augmentin 01/06/15  Solu-medrol -->po prednisone 60 daily on 01/05/14.  CBC + diff in am.   2. 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 COPD-Continue Inhalers with budesonide 4. AKI-Renal function worsening-Patient did not eat or drink much on 5/2--? continue IV saline 75-->125  cc/hr-rpt labs in am--if no better consider Renal US/further work-up 5. Mild Met acidosis-2/2 to #3-monitor  6. Anemia  of malignancy-Monitor trends c CBc in am   Code Status: Full Family Communication:  Disposition Plan: needs XRT and then potential slow taper Abx   Verneita Griffes, MD  [facility] Pager [phone] 01/06/2015, 12:06 PM    LOS: 2 days

## 2015-01-06 NOTE — Progress Notes (Signed)
[facility] Therapy Treatment Record Phone [phone]   Radiation Therapy was administered to Stephen Ellis on: 01/06/2015  11:11 AM and was treatment # 2 out of a planned course of 14 treatments.

## 2015-01-06 NOTE — Progress Notes (Signed)
PT Cancellation Note  Patient Details Name: Stephen Ellis MRN: [id number] DOB: 10-21-1945   Cancelled Treatment:    Reason Eval/Treat Not Completed: Patient at procedure or test/unavailable (XRT); check back next day or as schedule permits   [facility] 01/06/2015, 11:16 AM

## 2015-01-06 NOTE — Care Management Note (Signed)
Case Management Note  Patient Details  Name: Stephen Ellis MRN: [id number] Date of Birth: May 21, 1946  Subjective/Objective: 69 y/o m admitted w/PNA.                    Action/Plan:From home.Active w/[facility] HHRN/PT/OT   Expected Discharge Date:  01/09/15               Expected Discharge Plan:  [place]  In-House Referral:     Discharge planning Services  CM Consult  Post Acute Care Choice:  Home Health [facility] HHRN/PT/OT) Choice offered to:     DME Arranged:    DME Agency:     HH Arranged:    HH Agency:     Status of Service:  In process, will continue to follow  Medicare Important Message Given:    Date Medicare IM Given:    Medicare IM give by:    Date Additional Medicare IM Given:    Additional Medicare Important Message give by:     If discussed at [place] of Stay Meetings, dates discussed:    Additional Comments:Recommend resumeing HHRN/PT/OT.  Dessa Phi, RN 01/06/2015, 4:22 PM

## 2015-01-07 ENCOUNTER — Ambulatory Visit: Payer: Medicare HMO

## 2015-01-07 ENCOUNTER — Ambulatory Visit
Admit: 2015-01-07 | Discharge: 2015-01-07 | Disposition: A | Discharge status: Home / Self Care | Payer: Medicare HMO | Attending: Radiation Oncology | Admitting: Radiation Oncology

## 2015-01-07 DIAGNOSIS — N179 Acute kidney failure, unspecified: Secondary | ICD-10-CM

## 2015-01-07 DIAGNOSIS — J9601 Acute respiratory failure with hypoxia: Secondary | ICD-10-CM

## 2015-01-07 DIAGNOSIS — J189 Pneumonia, unspecified organism: Secondary | ICD-10-CM

## 2015-01-07 DIAGNOSIS — C3491 Malignant neoplasm of unspecified part of right bronchus or lung: Principal | ICD-10-CM

## 2015-01-07 LAB — BASIC METABOLIC PANEL
Anion gap: 9 (ref 5–15)
BUN: 20 mg/dL (ref 6–20)
CO2: 24 mmol/L (ref 22–32)
CREATININE: 1.22 mg/dL (ref 0.61–1.24)
Calcium: 10.1 mg/dL (ref 8.9–10.3)
Chloride: 102 mmol/L (ref 101–111)
GFR calc non Af Amer: 60 mL/min — ABNORMAL LOW (ref >60)
Glucose, Bld: 115 mg/dL — ABNORMAL HIGH (ref 70–99)
POTASSIUM: 3.5 mmol/L (ref 3.5–5.1)
Sodium: 135 mmol/L (ref 135–145)

## 2015-01-07 NOTE — Progress Notes (Signed)
PROGRESS NOTE  Stephen Ellis:[id number] DOB: 09-22-1945 DOA: 01/04/2015 PCP: Elizabeth Palau, MD Curt Bears, MD as Consulting Physician (Oncology)  Summary: 69 year old man with stage IV non-small cell lung cancer currently undergoing chemotherapy recently hospitalized for postobstructive pneumonia, discharged on Augmentin who presented 5/1 with shortness of breath and dyspnea on exertion, mild sputum production. CT chest revealed new soft tissue density right perihilar region with occlusion of the right mainstem bronchus. New consolidation seen right upper lobe, persistent consolidation right lower lobe. He was admitted for radiation oncology and oncology consultation and started on antibiotics for possible postobstructive pneumonia.  According to chart family was unaware of Augmentin prescription, patient may not been taking this medication.  Assessment/Plan: 1. Acute hypoxic respiratory failure. Resolved. Multifactorial including lung cancer, postobstructive pneumonia, suspect underlying COPD. 2. Stage IV non-small cell lung carcinoma undergoing chemotherapy with radiographic evidence of progression. Started palliative radiation therapy 5/2. Total course planned 14 treatments. 3. Recurrent postobstructive pneumonia. Transitioned to oral antibiotics. No hypoxia. Respiratory status appears stable. Suspect radiation therapy will be key component in resolution. 4. Acute kidney injury. Resolved. Excellent urine output 3.8 L. Thought secondary to poor oral intake. 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 5. Non-AG metabolic acidosis. Resolved. Secondary to acute kidney injury. 6. Suspect underlying COPD, not documented in chart. Appears clinically stable. 7. Anemia secondary to chemotherapy. Stable. Consider transfusion for hemoglobin less than 8. 8. Plasma cell dyscrasia diagnosed 2013 9. Tobacco dependence in  remission. 10. Dementia. 11. Recent hospitalization with discharge 4/25, treated for postobstructive pneumonia, acute renal failure. Discharged on Augmentin. Evaluated by physical therapy and discharged with home health physical therapy.   Improving, resp status stable, continue abx and radiation treatments. Renal function has improved.   Anticipate return home 5/5, resume home health.  Code Status: full code DVT prophylaxis: heparin Family Communication: I tried to reach [employer] by telephone at 3 numbers provided--no answer. Disposition Plan: HH PT, RN, OT  Murray Hodgkins, MD  [facility]  Pager [phone] If 7PM-7AM, please contact night-coverage at [email or web address], password [facility] 01/07/2015, 9:48 AM  LOS: 3 days   Consultants:  Radiation oncology  Oncology  Procedures:  Radiation therapy  Antibiotics:  Zosyn 5/2 >> 5/3  Augmentin 5/3 >>  HPI/Subjective: Feels good, breathing fine, no complaints.  Objective: Filed Vitals:   01/06/15 1451 01/06/15 2040 01/07/15 0531 01/07/15 0748  BP: 118/80 134/96 138/88   Pulse: 78 78 88   Temp: 97.6 F (36.4 C) 98.2 F (36.8 C) 97.9 F (36.6 C)   TempSrc: Oral Oral Oral   Resp: '18 18 18   '$ Height:      Weight:      SpO2: 98% 100% 100% 93%    Intake/Output Summary (Last 24 hours) at 01/07/15 0948 Last data filed at 01/07/15 0700  Gross per 24 hour  Intake   3330 ml  Output   3850 ml  Net   -520 ml     Filed Weights   01/04/15 1600 01/05/15 0400  Weight: 74 kg (163 lb 2.3 oz) 74.2 kg (163 lb 9.3 oz)    Exam:     Afebrile, vital signs are stable. No hypoxia. General:  Appears calm and comfortable Cardiovascular: RRR, no m/r/g.  Telemetry: SR, no arrhythmias  Respiratory: CTA bilaterally, no w/r/r. Normal respiratory effort. Psychiatric: grossly normal mood and affect, speech fluent and appropriate  New data reviewed:  Urine output 3.85 L  Oral intake 1 L  Creatinine normal, BMP unremarkable,  CO2 normal  Pertinent data since admission:  CT chest findings discussed with Dr. Julien Nordmann 5/4.  Pending data:    Scheduled Meds: . amoxicillin-clavulanate  1 tablet Oral Q12H  . budesonide-formoterol  2 puff Inhalation BID  . heparin  5,000 Units Subcutaneous 3 times per day  . predniSONE  60 mg Oral QAC breakfast   Continuous Infusions: . sodium chloride 125 mL/hr at 01/07/15 0534    Principal Problem:   Postobstructive pneumonia Active Problems:   Plasma cell dyscrasia   Bronchogenic carcinoma of right lung   Acute respiratory failure with hypoxia   Anemia of chronic disease   Leukopenia   AKI (acute kidney injury)   Lung cancer, main bronchus   Time spent 25 minutes

## 2015-01-07 NOTE — Evaluation (Signed)
Physical Therapy Evaluation Patient Details Name: Stephen Ellis MRN: [id number] DOB: 02-Mar-1946 Today's Date: 01/07/2015   History of Present Illness  69 yo male admitted  for radiation oncology/oncology consultation  and  antibiotics for possible postobstructive pneumonia.   very recent adm for post obstructive pna;   Hx of bronchogenic carcinoma, syncope, blind L eye.   Clinical Impression  Pt admitted with above diagnosis. Pt currently with functional limitations due to the deficits listed below (see PT Problem List).  Pt will benefit from skilled PT to increase their independence and safety with mobility to allow discharge to the venue listed below.   Pt much weaker and requiring more assist than last (very recent ) adm; Pt requiring mod assist for basic mobility/transfers, is at risk for falls, and would certainly need 67TI assist at home;   Will continue to follow for needs.    Follow Up Recommendations SNF (?-vs HHPT with 24hr care?)    Equipment Recommendations  Rolling walker with 5" wheels    Recommendations for Other Services       Precautions / Restrictions Precautions Precautions: Fall      Mobility  Bed Mobility Overal bed mobility: Needs Assistance Bed Mobility: Supine to Sit     Supine to sit: Supervision;Min guard     General bed mobility comments: min/guard for trunk coming forward adn for safety; pt moves very quickly  Transfers Overall transfer level: Needs assistance Equipment used: Rolling walker (2 wheeled) Transfers: Sit to/from Stand Sit to Stand: Mod assist Stand pivot transfers: Mod assist       General transfer comment: Assist to rise, stabilize. Very unsteady with standing, appears to have extensor tone LLE (?)  Ambulation/Gait Ambulation/Gait assistance: Mod assist Ambulation Distance (Feet): 5 Feet Assistive device: Straight cane Gait Pattern/deviations: Step-to pattern;Decreased step length - left;Staggering left;Staggering right     General Gait Details: pt very unsteady, repeatedly asked to sit down; pivotal steps, then took a few steps backwards to chair with mod assist to maintain balance/prevent fall  Stairs            Wheelchair Mobility    Modified Rankin (Stroke Patients Only)       Balance Overall balance assessment: Needs assistance Sitting-balance support: Feet supported;No upper extremity supported Sitting balance-Leahy Scale: Fair     Standing balance support: Single extremity supported Standing balance-Leahy Scale: Poor                               Pertinent Vitals/Pain Pain Assessment: No/denies pain HR 90s  O2 sats 96-100% on RA    Home Living Family/patient expects to be discharged to:: Private residence Living Arrangements: Non-relatives/Friends Available Help at Discharge: Available 24 hours/day         Home Layout: One level Home Equipment: Cane - single point Additional Comments: pt is difficult to understand; states "Stephen Ellis" (pt friend) helps him as needed; no family or friends present during eval    Prior Function Level of Independence: Needs assistance   Gait / Transfers Assistance Needed: pt reports he uses cane for ambulation (cane in room) and that he receives assistance for ADLS/mobility-unsure if this is accurate           Hand Dominance        Extremity/Trunk Assessment   Upper Extremity Assessment: Defer to OT evaluation;Generalized weakness           Lower Extremity Assessment: Generalized weakness  Communication      Cognition Arousal/Alertness: Awake/alert Behavior During Therapy: WFL for tasks assessed/performed Overall Cognitive Status: History of cognitive impairments - at baseline                      General Comments      Exercises        Assessment/Plan    PT Assessment Patient needs continued PT services  PT Diagnosis Difficulty walking;Generalized weakness   PT Problem List Decreased  strength;Decreased activity tolerance;Decreased balance;Decreased mobility;Decreased knowledge of use of DME;Decreased cognition  PT Treatment Interventions DME instruction;Gait training;Functional mobility training;Therapeutic activities;Therapeutic exercise;Patient/family education;Balance training   PT Goals (Current goals can be found in the Care Plan section) Acute Rehab PT Goals Patient Stated Goal: none stated PT Goal Formulation: Patient unable to participate in goal setting Time For Goal Achievement: 01/21/15 Potential to Achieve Goals: Good    Frequency Min 3X/week   Barriers to discharge        Co-evaluation               End of Session Equipment Utilized During Treatment: Gait belt Activity Tolerance: Patient limited by fatigue Patient left: in chair;with call bell/phone within reach;with family/visitor present Nurse Communication: Mobility status         Time: 5041-3643 PT Time Calculation (min) (ACUTE ONLY): 23 min   Charges:     PT Treatments $Therapeutic Activity: 8-22 mins   PT G Codes:        Zamantha Strebel 02/05/2015, 1:55 PM

## 2015-01-07 NOTE — Progress Notes (Signed)
[facility] Therapy Treatment Record Phone [phone]   Radiation Therapy was administered to Stephen Ellis on: 01/07/2015  4:02 PM and was treatment # 3 out of a planned course of 14 treatments.

## 2015-01-08 ENCOUNTER — Ambulatory Visit
Admit: 2015-01-08 | Discharge: 2015-01-08 | Disposition: A | Discharge status: Home / Self Care | Payer: Medicare HMO | Attending: Radiation Oncology | Admitting: Radiation Oncology

## 2015-01-08 MED ORDER — GUAIFENESIN-DM 100-10 MG/5ML PO SYRP
5.0000 mL | ORAL_SOLUTION | ORAL | Status: DC | PRN
  Administered 2015-01-08: 5 mL via ORAL
  Filled 2015-01-08: qty 10

## 2015-01-08 NOTE — Clinical Social Work Note (Signed)
Clinical Social Work Assessment  Patient Details  Name: Stephen Ellis MRN: [id number] Date of Birth: [birth year]  Date of referral:  01/08/15               Reason for consult:  Facility Placement                Permission sought to share information with:  Facility Sport and exercise psychologist, Family Supports Permission granted to share information::  Yes, Verbal Permission Granted  Name::        Agency::     Relationship::     Contact Information:     Housing/Transportation Living arrangements for the past 2 months:  Single Family Home Source of Information:  Patient, Other (Comment Required) (patient's nephew, Stephen Ellis via phone) Patient Interpreter Needed:  None Criminal Activity/Legal Involvement Pertinent to Current Situation/Hospitalization:    Significant Relationships:  Other Family Members Lives with:  Self Do you feel safe going back to the place where you live?  No Need for family participation in patient care:  Yes (Comment)  Care giving concerns:  CSW reviewed PT evaluation recommending SNF at discharge.    Social Worker assessment / plan:  CSW spoke with patient at bedside & nephew, Stephen Ellis via phone re: SNF placement. CSW provided SNF bed offers - nephew chose [facility] as it is right around the corner from their home.   Employment status:  Retired Nurse, adult, Medicaid In [place] PT Recommendations:  [place] / Referral to community resources:  [place]  Patient/Family's Response to care:  Patient is agreeable with plan for SNF, realizes that he needs increased care/supervision before returning home.   Patient/Family's Understanding of and Emotional Response to Diagnosis, Current Treatment, and Prognosis:  Patient's nephew & sister are concerned about him coming home but needing transportation to radiation - is agreeable with plan for SNF until he's strong enough to return home. CSW informed  Stephen Ellis at [facility] that patient is accepting their bed offer & has started on [facility] authorization.   Emotional Assessment Appearance:  Appears stated age Attitude/Demeanor/Rapport:    Affect (typically observed):  Calm, Pleasant, Accepting Orientation:  Oriented to Self, Oriented to Place, Oriented to  Time, Oriented to Situation Alcohol / Substance use:    Psych involvement (Current and /or in the community):  No (Comment)  Discharge Needs  Concerns to be addressed:    Readmission within the last 30 days:    Current discharge risk:    Barriers to Discharge:      Stephen Brooking, LCSW 01/08/2015, 11:52 AM

## 2015-01-08 NOTE — Clinical Social Work Placement (Signed)
   CLINICAL SOCIAL WORK PLACEMENT  NOTE  Date:  01/08/2015  Patient Details  Name: Stephen Ellis MRN: [id number] Date of Birth: April 18, 1946  Clinical Social Work is seeking post-discharge placement for this patient at the [place] level of care (*CSW will initial, date and re-position this form in  chart as items are completed):  Yes   Patient/family provided with [place] Work Department's list of facilities offering this level of care within the geographic area requested by the patient (or if unable, by the patient's family).  Yes   Patient/family informed of their freedom to choose among providers that offer the needed level of care, that participate in Medicare, Medicaid or managed care program needed by the patient, have an available bed and are willing to accept the patient.  Yes   Patient/family informed of [facility]'s ownership interest in [facility] and [facility], as well as of the fact that they are under no obligation to receive care at these facilities.  PASRR submitted to EDS on 01/08/15     PASRR number received on 01/08/15     Existing PASRR number confirmed on       FL2 transmitted to all facilities in geographic area requested by pt/family on 01/08/15     FL2 transmitted to all facilities within larger geographic area on       Patient informed that his/her managed care company has contracts with or will negotiate with certain facilities, including the following:        Yes   Patient/family informed of bed offers received.  Patient chooses bed at [facility]     Physician recommends and patient chooses bed at      Patient to be transferred to [facility] on  .  Patient to be transferred to facility by       Patient family notified on   of transfer.  Name of family member notified:        PHYSICIAN       Additional Comment:    _______________________________________________ Standley Brooking,  LCSW 01/08/2015, 11:55 AM

## 2015-01-08 NOTE — Care Management Note (Signed)
Case Management Note  Patient Details  Name: Stephen Ellis MRN: [id number] Date of Birth: [birth year]  Subjective/Objective:                    Action/Plan:d/c snf.   Expected Discharge Date:  01/09/15               Expected Discharge Plan:  Skilled Nursing Facility  In-House Referral:  Clinical Social Work  Discharge planning Services  CM Consult  Post Acute Care Choice:    Choice offered to:     DME Arranged:    DME Agency:     HH Arranged:    [place] Agency:     Status of Service:  In process, will continue to follow  Medicare Important Message Given:  Yes Date Medicare IM Given:  01/07/15 Medicare IM give by:  Dessa Phi Date Additional Medicare IM Given:    Additional Medicare Important Message give by:     If discussed at [place] of Stay Meetings, dates discussed:    Additional Comments:  Dessa Phi, RN 01/08/2015, 4:06 PM

## 2015-01-08 NOTE — Progress Notes (Signed)
PROGRESS NOTE  Stephen Ellis DJM:[id number] DOB: 1946/03/21 DOA: 01/04/2015 PCP: Elizabeth Palau, MD Curt Bears, MD as Consulting Physician (Oncology)  Summary: 69 year old man with stage IV non-small cell lung cancer currently undergoing chemotherapy recently hospitalized for postobstructive pneumonia, discharged on Augmentin who presented 5/1 with shortness of breath and dyspnea on exertion, mild sputum production. CT chest revealed new soft tissue density right perihilar region with occlusion of the right mainstem bronchus. New consolidation seen right upper lobe, persistent consolidation right lower lobe. He was admitted for radiation oncology and oncology consultation and started on antibiotics for possible postobstructive pneumonia.  According to chart family was unaware of Augmentin prescription, patient may not been taking this medication.  Assessment/Plan: 1. Acute hypoxic respiratory failure. Multifactorial including lung cancer, postobstructive pneumonia, suspect underlying COPD. resolved. 2. Stage IV non-small cell lung carcinoma undergoing chemotherapy with radiographic evidence of progression. Started palliative radiation therapy 5/2. Total course planned 14 treatments. 3. Recurrent postobstructive pneumonia. Continues to improve. On oral antibiotics. No hypoxia. Suspect radiation therapy will be key component in resolution. 4. Acute kidney injury. Resolved. Thought secondary to poor oral intake. 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 5. Non-AG metabolic acidosis. Resolved. Secondary to acute kidney injury. 6. Suspect underlying COPD, not documented in chart. Stable. 7. Anemia secondary to chemotherapy. Stable. Consider transfusion for hemoglobin less than 8. 8. Plasma cell dyscrasia diagnosed 2013 9. Tobacco dependence in remission. 10. Dementia. 11. Recent hospitalization with discharge 4/25, treated for  postobstructive pneumonia, acute renal failure. Discharged on Augmentin. Evaluated by physical therapy and discharged with home health physical therapy.   Continues to improve. Continue oral antibiotics.   Chronic issues appear stable.  Now skilled nursing facility has been recommended. Anticipate transfer to skilled nursing facility when insurance approval obtained, anticipate 5/6.  Code Status: full code DVT prophylaxis: heparin Family Communication: Discussed with [name] by telephone 5/5. Disposition Plan:   Murray Hodgkins, MD  [facility]  Pager [phone] If 7PM-7AM, please contact night-coverage at [email or web address], password [facility] 01/08/2015, 5:56 PM  LOS: 4 days   Consultants:  Radiation oncology  Oncology  Physical therapy: Skilled nursing facility.  Procedures:  Radiation therapy  Antibiotics:  Zosyn 5/2 >> 5/3  Augmentin 5/3 >> 5/8  HPI/Subjective: No complaints. Feeling fine. Breathing fine.  Objective: Filed Vitals:   01/07/15 2247 01/08/15 0604 01/08/15 0938 01/08/15 1434  BP:  121/94  116/72  Pulse:  78  74  Temp:  98 F (36.7 C)  98.2 F (36.8 C)  TempSrc:  Oral  Oral  Resp:  18  18  Height:      Weight:      SpO2: 95% 99% 94% 96%    Intake/Output Summary (Last 24 hours) at 01/08/15 1756 Last data filed at 01/08/15 1416  Gross per 24 hour  Intake    720 ml  Output   1900 ml  Net  -1180 ml     Filed Weights   01/04/15 1600 01/05/15 0400  Weight: 74 kg (163 lb 2.3 oz) 74.2 kg (163 lb 9.3 oz)    Exam:     Afebrile, VSS. No hypoxia. General:  Appears calm and comfortable Cardiovascular: RRR, no m/r/g. No LE edema. Respiratory: CTA bilaterally, no w/r/r. Normal respiratory effort. Psychiatric: grossly normal mood and affect, speech fluent and appropriate  New data reviewed:  Urine output 3.85 L  Oral intake 1 L  Creatinine normal, BMP unremarkable, CO2 normal  Pertinent data since  admission:  CT chest findings  discussed with Dr. Julien Nordmann 5/4.  Pending data:    Scheduled Meds: . amoxicillin-clavulanate  1 tablet Oral Q12H  . budesonide-formoterol  2 puff Inhalation BID  . heparin  5,000 Units Subcutaneous 3 times per day  . predniSONE  60 mg Oral QAC breakfast   Continuous Infusions:    Principal Problem:   Postobstructive pneumonia Active Problems:   Plasma cell dyscrasia   Bronchogenic carcinoma of right lung   Acute respiratory failure with hypoxia   Anemia of chronic disease   Leukopenia   AKI (acute kidney injury)   Lung cancer, main bronchus   Time spent 20 minutes

## 2015-01-09 ENCOUNTER — Ambulatory Visit
Admit: 2015-01-09 | Discharge: 2015-01-09 | Disposition: A | Discharge status: Home / Self Care | Payer: Medicare HMO | Attending: Radiation Oncology | Admitting: Radiation Oncology

## 2015-01-09 ENCOUNTER — Ambulatory Visit
Admit: 2015-01-09 | Discharge: 2015-01-09 | Disposition: A | Discharge status: Home / Self Care | Payer: Medicare Other | Attending: Radiation Oncology | Admitting: Radiation Oncology

## 2015-01-09 ENCOUNTER — Telehealth: Payer: Self-pay | Admitting: Internal Medicine

## 2015-01-09 DIAGNOSIS — C3401 Malignant neoplasm of right main bronchus: Secondary | ICD-10-CM

## 2015-01-09 DIAGNOSIS — Z510 Encounter for antineoplastic radiation therapy: Secondary | ICD-10-CM | POA: Insufficient documentation

## 2015-01-09 DIAGNOSIS — C3491 Malignant neoplasm of unspecified part of right bronchus or lung: Secondary | ICD-10-CM

## 2015-01-09 NOTE — Progress Notes (Signed)
  Radiation Oncology         [phone]) [phone] ________________________________  Name: Stephen Ellis MRN: [id number]  Date: 01/09/2015  DOB: [birth year]    Weekly Radiation Therapy Management    ICD-9-CM ICD-10-CM   1. Lung cancer, main bronchus, right 162.2 C34.01     Current Dose: 12.5 Gy     Planned Dose:  35 Gy  Narrative . . . . . . . . The patient presents for routine under treatment assessment. The patient respiratory status is stable Set-up films were reviewed. The chart was checked.  Physical Findings. . . . Weight essentially stable.  No significant changes.  Radiographic findings:  The patient's daily imaging reveals that the right lower lung had collapsed, and has now reexpanded this led to a new CT simulation today to account for the anatomic change.  Impression . . . . . . . The patient is tolerating radiation.  Plan . . . . . . . . . . . . Continue treatment as planned. We will adjust the radiation treatment area based on the tumor regression.  This document serves as a record of services personally performed by Tyler Pita, MD. It was created on his behalf by Jeralene Peters, a trained medical scribe. The creation of this record is based on the scribe's personal observations and the provider's statements to them. This document has been checked and approved by the attending provider.       ________________________________  Sheral Apley. Tammi Klippel, M.D.

## 2015-01-09 NOTE — Care Management Note (Signed)
Case Management Note  Patient Details  Name: VAUGHAN GARFINKLE MRN: [id number] Date of Birth: 31-Dec-1945  Subjective/Objective:                    Action/Plan:   Expected Discharge Date:  01/09/15               Expected Discharge Plan:  [place]  In-House Referral:  Clinical Social Work  Discharge planning Services  CM Consult  Post Acute Care Choice:    Choice offered to:     DME Arranged:    DME Agency:     HH Arranged:    [place] Agency:     Status of Service:  Completed, signed off  Medicare Important Message Given:  Yes Date Medicare IM Given:  01/07/15 Medicare IM give by:  Dessa Phi Date Additional Medicare IM Given:  01/09/15 Additional Medicare Important Message give by:  Dessa Phi  If discussed at Long Length of Stay Meetings, dates discussed:    Additional Comments:  Dessa Phi, RN 01/09/2015, 1:09 PM

## 2015-01-09 NOTE — Progress Notes (Signed)
Report called to [facility] and given to [name]. Pt was D/C to [facility] and transported via [place]. Family made aware.  Stephen Ellis

## 2015-01-09 NOTE — Discharge Summary (Signed)
Physician Discharge Summary  Stephen Ellis NLG:[id number] DOB: Jan 12, 1946 DOA: 01/04/2015  PCP: Elizabeth Palau, MD  Admit date: 01/04/2015 Discharge date: 01/09/2015  Recommendations for Outpatient Follow-up:  1. Radiation therapy for recurrent lung cancer 2. Recommend revisiting goals of cancer and prognosis, deferred to Dr. Julien Nordmann 3. Advanced stage IV NSCLC 4. Resolution of postobstuctive pneumonia.   Follow-up Information    Follow up with Elizabeth Palau, MD In 2 weeks.   Specialty:  Family Medicine   Contact information:   [address] [place] [address] [phone]       Follow up with Eilleen Kempf., MD. Schedule an appointment as soon as possible for a visit in 1 week.   Specialty:  Oncology   Contact information:   [address] [place] [address] [phone]       Follow up with Lora Paula, MD.   Specialty:  Radiation Oncology   Why:  for daily radiation therapy, call office   Contact information:   [address] [place] [address] [phone]      Discharge Diagnoses:  1. Acute hypoxic respiratory failure  Stage IV non-small cell lung carcinoma   Recurrent postobstructive pneumonia   Acute kidney injury   Non-AG metabolic acidosis   Suspect underlying COPD   Anemia secondary to chemotherapy   Tobacco dependence in remission.  Dementia.  Discharge Condition: improved Disposition: SNF  Diet recommendation: regular  Filed Weights   01/04/15 1600 01/05/15 0400  Weight: 74 kg (163 lb 2.3 oz) 74.2 kg (163 lb 9.3 oz)    History of present illness:  69 year old man with stage IV non-small cell lung cancer currently undergoing chemotherapy recently hospitalized for postobstructive pneumonia, discharged on Augmentin who presented 5/1 with shortness of breath and dyspnea on exertion, mild sputum production. CT chest revealed new soft tissue density right perihilar region with occlusion of  the right mainstem bronchus. New consolidation seen right upper lobe, persistent consolidation right lower lobe. He was admitted for radiation oncology and oncology consultation and started on antibiotics for possible postobstructive pneumonia.  Hospital Course:  Stephen Ellis was treated with abx and radiation therapy with quick resolution of hypoxia. He rapidly stabilized. Dr. Verlon Au discussed CT findings with PCCM as documented and I reviewed CT findings with Dr. Raynelle Chary. Julien Nordmann recommended radiation but no other treatment. Patient's hospitalization was uncomplicated. SNF was recommended and agreed to by patient and family.  1. Acute hypoxic respiratory failure. Resolved. Multifactorial including lung cancer, postobstructive pneumonia, suspect underlying COPD.  2. Stage IV non-small cell lung carcinoma undergoing chemotherapy with radiographic evidence of progression. Started palliative radiation therapy 5/2. Total course planned 14 treatments. CT findings were discussed with his oncologist Dr. Julien Nordmann who recommended radiation but had no further recommendations. 3. Recurrent postobstructive pneumonia. Clinically resolved. On oral antibiotics. No hypoxia. Suspect radiation therapy will be key component in resolution. 4. Acute kidney injury. Resolved. Thought secondary to poor oral intake. 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 5. Non-AG metabolic acidosis. Resolved. Secondary to acute kidney injury. 6. Suspect underlying COPD, not documented in chart. Stable. 7. Anemia secondary to chemotherapy. Stable.  8. Plasma cell dyscrasia diagnosed 2013 9. Tobacco dependence in remission. 10. Dementia. 11. Recent hospitalization with discharge 4/25, treated for postobstructive pneumonia, acute renal failure. Discharged on Augmentin. Evaluated by physical therapy and discharged with home health physical therapy.   Consultants:  Radiation  oncology  Oncology by telephone  Physical therapy:  Skilled nursing facility.  Procedures:  Radiation therapy  Antibiotics:  Zosyn 5/2 >> 5/3  Augmentin 5/3 >> 5/8  Discharge Instructions   Current Discharge Medication List    CONTINUE these medications which have NOT CHANGED   Details  albuterol (PROVENTIL) (2.5 MG/3ML) 0.083% nebulizer solution Take 3 mLs (2.5 mg total) by nebulization every 6 (six) hours as needed for wheezing or shortness of breath. Qty: 75 mL, Refills: 0    amLODipine (NORVASC) 10 MG tablet Take 1 tablet (10 mg total) by mouth daily. Qty: 30 tablet, Refills: 2    amoxicillin-clavulanate (AUGMENTIN) 875-125 MG per tablet Take 1 tablet by mouth every 12 (twelve) hours. Qty: 14 tablet, Refills: 0    budesonide-formoterol (SYMBICORT) 160-4.5 MCG/ACT inhaler Inhale 2 puffs into the lungs 2 (two) times daily. Qty: 1 Inhaler, Refills: 12    ipratropium-albuterol (DUONEB) 0.5-2.5 (3) MG/3ML SOLN Take 3 mLs by nebulization every 6 (six) hours. Qty: 360 mL, Refills: 1    Multiple Vitamin (MULTIVITAMIN WITH MINERALS) TABS tablet Take 1 tablet by mouth daily.    Probiotic Product (PROBIOTIC DAILY PO) Take 1 tablet by mouth daily.    prochlorperazine (COMPAZINE) 10 MG tablet Take 1 tablet (10 mg total) by mouth every 6 (six) hours as needed for nausea or vomiting. Qty: 30 tablet, Refills: 0       No Known Allergies  The results of significant diagnostics from this hospitalization (including imaging, microbiology, ancillary and laboratory) are listed below for reference.    Significant Diagnostic Studies: Dg Chest 2 View  01/02/2015   CLINICAL DATA:  69 year old male with cough and shortness of Breath. Current history of lung cancer with obstructing right suprahilar mass. Subsequent encounter.  EXAM: CHEST  2 VIEW  COMPARISON:  Chest CT 12/26/2014 and earlier.  FINDINGS: Mildly lower lung volumes. Abnormal confluent right lower lobe opacity better  demonstrated on 12/26/2014. No associated pleural effusion identified.  Abnormal right mediastinal contour with medial right lung collapse again noted and appears radiographically progressed since 10/19/2014. No pneumothorax or pulmonary edema. Stable left lung. Stable visualized osseous structures.  IMPRESSION: 1. Right lower lobe pneumonia. No pleural effusion identified. See also chest CT 12/26/2014. 2. Right suprahilar lung mass with medial upper lobe collapse appears radiographically progressed since February.   Electronically Signed   By: Genevie Ann M.D.   On: 01/02/2015 14:52   Ct Chest Wo Contrast  12/26/2014   CLINICAL DATA:  Nonproductive cough for 3 days. Ongoing chemotherapy and radiation for right lung cancer.  EXAM: CT CHEST WITHOUT CONTRAST  TECHNIQUE: Multidetector CT imaging of the chest was performed following the standard protocol without IV contrast.  COMPARISON:  11/21/2014  FINDINGS: CT CHEST FINDINGS  Mediastinum/Nodes: Heart is normal size. Aorta is normal caliber. 1.4 cm subcarinal lymph node is stable. Other small scattered subcentimeric lymph nodes in the mediastinum. No axillary adenopathy. No visible hilar adenopathy on this unenhanced study.  Lungs/Pleura: Continued postobstructive collapse of the right upper lobe. Obstructing central right upper lobe mass again noted, grossly unchanged, difficult to measure exactly will without intravenous contrast and due to the adjacent collapsed right upper lobe. New area of consolidation in the right lower lobe compatible with pneumonia. No pleural effusions.  Musculoskeletal: Chest wall soft tissues are unremarkable. No acute bony abnormality or focal bone lesion.  Upper abdomen: Imaging into the upper abdomen shows no acute findings.  IMPRESSION: New area consolidation in the right lower lobe compatible with pneumonia.  Stable central obstructing right upper  lobe mass with right upper lobe collapse. Stable subcarinal adenopathy.   Electronically  Signed   By: Rolm Baptise M.D.   On: 12/26/2014 04:55   Ct Angio Chest Pe W/cm &/or Wo Cm  01/04/2015   CLINICAL DATA:  Evaluate for pulmonary embolus. Central chest pain. D-dimer 1.66. Recent treatment for pneumonia. History of lung cancer.  EXAM: CT ANGIOGRAPHY CHEST WITH CONTRAST  TECHNIQUE: Multidetector CT imaging of the chest was performed using the standard protocol during bolus administration of intravenous contrast. Multiplanar CT image reconstructions and MIPs were obtained to evaluate the vascular anatomy.  CONTRAST:  146m OMNIPAQUE IOHEXOL 350 MG/ML SOLN  COMPARISON:  CT of the chest on 12/26/2014  FINDINGS: Heart: Heart is normal in size. No pericardial effusion. No significant coronary artery calcifications.  Vascular structures: Pulmonary arteries are well opacified. No evidence for acute pulmonary embolus. There is poor opacification of the right posterior pulmonary vein. This may be related to shunting or significant consolidation within the right lower lobe. It is not possible to exclude tumor thrombus into the pulmonary vein.  Mediastinum/thyroid: Sub cm low density areas within the right lobe of the thyroid. Subcarinal lymph node is persistent. There is increased density in the right hilar region possibly representing adenopathy or mass. Soft tissue density in the region of the right mainstem bronchus is 2.6 x 2.6 cm.  Lungs/Airways: There is persistent consolidation within the right lower lobe. There is increased density in the right hilar region. There is masslike encroachment upon the right mainstem bronchus. New consolidation is identified within the right upper lobe. The left mainstem bronchus and left lung remain clear.  Upper abdomen: Unremarkable.  Chest wall/osseous structures: No suspicious lytic or blastic lesions are identified.  Review of the MIP images confirms the above findings.  IMPRESSION: 1. Technically adequate exam showing no pulmonary embolus. 2. New soft tissue density  in the right perihilar region with occlusion of the right mainstem bronchus. Findings may be related to tumor or infection occluding the bronchus. There is new associated consolidation of the right upper lobe and persistent consolidation of the right lower lobe accounting for the patient's symptoms. 3. Tumor thrombus versus poor opacification of the right posterior pulmonary vein. See above.   Electronically Signed   By: ENolon NationsM.D.   On: 01/04/2015 12:40    Microbiology: Recent Results (from the past 240 hour(s))  MRSA PCR Screening     Status: None   Collection Time: 01/04/15  4:11 PM  Result Value Ref Range Status   MRSA by PCR NEGATIVE NEGATIVE Final    Comment:        The GeneXpert MRSA Assay (FDA approved for NASAL specimens only), is one component of a comprehensive MRSA colonization surveillance program. It is not intended to diagnose MRSA infection nor to guide or monitor treatment for MRSA infections.      Labs: Basic Metabolic Panel:  Recent Labs Lab 01/04/15 1015 01/05/15 0354 01/06/15 1210 01/07/15 0934  NA 138 138 134* 135  K 4.0 4.4 4.0 3.5  CL 108 108 108 102  CO2 22 21* 21* 24  GLUCOSE 111* 155* 162* 115*  BUN 18 26* 28* 20  CREATININE 1.34* 2.06* 1.47* 1.22  CALCIUM 10.0 9.8 9.4 10.1   Liver Function Tests:  Recent Labs Lab 01/05/15 0354  AST 17  ALT 25  ALKPHOS 49  BILITOT 0.8  PROT 8.6*  ALBUMIN 2.7*   CBC:  Recent Labs Lab 01/04/15 1015 01/05/15 0354  WBC 6.8 16.2*  NEUTROABS 5.4  --   HGB 10.8* 10.3*  HCT 32.3* 31.5*  MCV 100.6* 101.3*  PLT 210 186   Cardiac Enzymes:  Recent Labs Lab 01/04/15 1015  TROPONINI <0.03    Principal Problem:   Postobstructive pneumonia Active Problems:   Plasma cell dyscrasia   Bronchogenic carcinoma of right lung   Acute respiratory failure with hypoxia   Anemia of chronic disease   Leukopenia   AKI (acute kidney injury)   Lung cancer, main bronchus   Time coordinating  discharge: 35 minutes  Signed:  Murray Hodgkins, MD [facility] 01/09/2015, 2:32 PM

## 2015-01-09 NOTE — Progress Notes (Signed)
Physical Therapy Treatment Patient Details Name: Stephen Ellis MRN: [id number] DOB: 01/26/1946 Today's Date: 01/09/2015    History of Present Illness 69 yo male admitted  for radiation oncology/oncology consultation  and  antibiotics for possible postobstructive pneumonia.   very recent adm for post obstructive pna;   Hx of bronchogenic carcinoma, syncope, blind L eye.     PT Comments    Pt progressing, incr gait today but continues to be at risk for falls; needs SNF  Follow Up Recommendations  SNF     Equipment Recommendations  Rolling walker with 5" wheels    Recommendations for Other Services OT consult     Precautions / Restrictions Precautions Precautions: Fall Restrictions Weight Bearing Restrictions: No    Mobility  Bed Mobility Overal bed mobility: Needs Assistance Bed Mobility: Supine to Sit     Supine to sit: Modified independent (Device/Increase time)     General bed mobility comments: HOB elevated 30*, did not use rails  Transfers Overall transfer level: Needs assistance Equipment used: Rolling walker (2 wheeled) Transfers: Sit to/from Stand Sit to Stand: Mod assist         General transfer comment: pt unsteady with standing, requires assist to rise and to control descent; multi-modal cues for hand placement  Ambulation/Gait Ambulation/Gait assistance: Min assist Ambulation Distance (Feet): 260 Feet Assistive device: Rolling walker (2 wheeled) Gait Pattern/deviations: Step-through pattern;Trunk flexed;Wide base of support;Drifts right/left Gait velocity: pt has incr difficulty and requires incr assist with  backward amb; he continues to be at risk for falls   General Gait Details: multi-modal cues adn assist for RW safety, position, and  obstacle negotiation; pt  repeatedly bumps into objects on his right side (blind in left eye); have encouraged visual scanning; pt reports he is "supposed  to wear glasses but doesn't"   Stairs             Wheelchair Mobility    Modified Rankin (Stroke Patients Only)       Balance                                    Cognition Arousal/Alertness: Awake/alert Behavior During Therapy: WFL for tasks assessed/performed Overall Cognitive Status: History of cognitive impairments - at baseline                      Exercises      General Comments        Pertinent Vitals/Pain      Home Living                      Prior Function            PT Goals (current goals can now be found in the care plan section) Acute Rehab PT Goals Patient Stated Goal: none stated PT Goal Formulation: Patient unable to participate in goal setting Time For Goal Achievement: 01/21/15 Potential to Achieve Goals: Good Progress towards PT goals: Progressing toward goals    Frequency  Min 3X/week    PT Plan Current plan remains appropriate    Co-evaluation             End of Session Equipment Utilized During Treatment: Gait belt Activity Tolerance: Patient tolerated treatment well Patient left: in chair;with call bell/phone within reach;with chair alarm set     Time: 3267-1245 PT Time Calculation (min) (ACUTE ONLY): 16 min  Charges:  $Gait Training: 8-22 mins                    G Codes:      Diannia Hogenson 01/30/2015, 10:13 AM

## 2015-01-09 NOTE — Clinical Social Work Placement (Signed)
   CLINICAL SOCIAL WORK PLACEMENT  NOTE  Date:  01/09/2015  Patient Details  Name: Stephen Ellis MRN: [id number] Date of Birth: [birth year]  Clinical Social Work is seeking post-discharge placement for this patient at the [place] level of care (*CSW will initial, date and re-position this form in  chart as items are completed):  Yes   Patient/family provided with [place] Work Department's list of facilities offering this level of care within the geographic area requested by the patient (or if unable, by the patient's family).  Yes   Patient/family informed of their freedom to choose among providers that offer the needed level of care, that participate in Medicare, Medicaid or managed care program needed by the patient, have an available bed and are willing to accept the patient.  Yes   Patient/family informed of [facility]'s ownership interest in [facility] and [facility], as well as of the fact that they are under no obligation to receive care at these facilities.  PASRR submitted to EDS on 01/08/15     PASRR number received on 01/08/15     Existing PASRR number confirmed on       FL2 transmitted to all facilities in geographic area requested by pt/family on 01/08/15     FL2 transmitted to all facilities within larger geographic area on       Patient informed that his/her managed care company has contracts with or will negotiate with certain facilities, including the following:        Yes   Patient/family informed of bed offers received.  Patient chooses bed at [facility]     Physician recommends and patient chooses bed at      Patient to be transferred to [facility] on 01/09/15.  Patient to be transferred to facility by [facility]     Patient family notified on 01/09/15 of transfer.  Name of family member notified:  message left for patient's nephew, [name]      PHYSICIAN       Additional Comment:     _______________________________________________ Standley Brooking, LCSW 01/09/2015, 3:05 PM

## 2015-01-09 NOTE — Progress Notes (Addendum)
PROGRESS NOTE  Stephen Ellis WSF:[id number] DOB: 20-Dec-1945 DOA: 01/04/2015 PCP: Elizabeth Palau, MD Curt Bears, MD as Consulting Physician (Oncology)  Summary: 69 year old man with stage IV non-small cell lung cancer currently undergoing chemotherapy recently hospitalized for postobstructive pneumonia, discharged on Augmentin who presented 5/1 with shortness of breath and dyspnea on exertion, mild sputum production. CT chest revealed new soft tissue density right perihilar region with occlusion of the right mainstem bronchus. New consolidation seen right upper lobe, persistent consolidation right lower lobe. He was admitted for radiation oncology and oncology consultation and started on antibiotics for possible postobstructive pneumonia.  Assessment/Plan: 1. Acute hypoxic respiratory failure. Resolved. Multifactorial including lung cancer, postobstructive pneumonia, suspect underlying COPD.   2. Stage IV non-small cell lung carcinoma undergoing chemotherapy with radiographic evidence of progression. Started palliative radiation therapy 5/2. Total course planned 14 treatments. CT findings were discussed with his oncologist Dr. Julien Nordmann who recommended radiation but had no further recommendations. 3. Recurrent postobstructive pneumonia. Clinically resolved. On oral antibiotics. No hypoxia. Suspect radiation therapy will be key component in resolution. 4. Acute kidney injury. Resolved. Thought secondary to poor oral intake. 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 5. Non-AG metabolic acidosis. Resolved. Secondary to acute kidney injury. 6. Suspect underlying COPD, not documented in chart. Stable. 7. Anemia secondary to chemotherapy. Stable.  8. Plasma cell dyscrasia diagnosed 2013 9. Tobacco dependence in remission. 10. Dementia. 11. Recent hospitalization with discharge 4/25, treated for postobstructive pneumonia, acute renal  failure. Discharged on Augmentin. Evaluated by physical therapy and discharged with home health physical therapy.   Overall continues to improve  Finish abx  Transfer to SNF today  Discussed with nephew K. Love, agreeable to transfer  Murray Hodgkins, MD  [facility]  Pager [phone] If 7PM-7AM, please contact night-coverage at [email or web address], password [facility] 01/09/2015, 2:22 PM  LOS: 5 days   Consultants:  Radiation oncology  Oncology  Physical therapy: Skilled nursing facility.  Procedures:  Radiation therapy  Antibiotics:  Zosyn 5/2 >> 5/3  Augmentin 5/3 >> 5/8  HPI/Subjective: Doing well, no complaints. Breathing fine.  Objective: Filed Vitals:   01/08/15 2211 01/09/15 0502 01/09/15 0856 01/09/15 1354  BP:  121/89  128/94  Pulse:  77  82  Temp:  97.7 F (36.5 C)  97.5 F (36.4 C)  TempSrc:  Oral  Oral  Resp:  18  16  Height:      Weight:      SpO2: 96% 98% 96% 99%    Intake/Output Summary (Last 24 hours) at 01/09/15 1422 Last data filed at 01/09/15 1357  Gross per 24 hour  Intake    480 ml  Output    625 ml  Net   -145 ml     Filed Weights   01/04/15 1600 01/05/15 0400  Weight: 74 kg (163 lb 2.3 oz) 74.2 kg (163 lb 9.3 oz)    Exam:     Afebrile, vitals stable. No hypoxia. General:  Appears comfortable, calm. Cardiovascular: Regular rate and rhythm, no murmur, rub or gallop. No lower extremity edema. Respiratory: Clear to auscultation bilaterally, no wheezes, rales or rhonchi. Normal respiratory effort. Psychiatric: grossly normal mood and affect, speech fluent and appropriate  New data reviewed:  No new labs  Pertinent data since admission:  CT chest findings discussed with Dr. Julien Nordmann 5/4.  Pending data:    Scheduled Meds: . amoxicillin-clavulanate  1 tablet Oral Q12H  . budesonide-formoterol  2 puff Inhalation BID  .  heparin  5,000 Units Subcutaneous 3 times per day  . predniSONE  60 mg Oral QAC breakfast    Continuous Infusions:    Principal Problem:   Postobstructive pneumonia Active Problems:   Plasma cell dyscrasia   Bronchogenic carcinoma of right lung   Acute respiratory failure with hypoxia   Anemia of chronic disease   Leukopenia   AKI (acute kidney injury)   Lung cancer, main bronchus

## 2015-01-09 NOTE — Telephone Encounter (Signed)
Pt called to cx labs...ok and aware

## 2015-01-09 NOTE — Progress Notes (Signed)
  Radiation Oncology         [phone]) [phone] ________________________________  Name: Stephen Ellis  MRN: [id number]  Date: 01/09/2015  DOB: 02-14-1946  Chart Note:  The patient returned to [place] today for repeat of his CT imaging due to increased aeration of the right lung following some initial tumor regression. Repeat planning will be performed in order to account for his altered internal anatomy.  ________________________________  Sheral Apley Tammi Klippel, M.D.

## 2015-01-12 ENCOUNTER — Ambulatory Visit
Admission: RE | Admit: 2015-01-12 | Discharge: 2015-01-12 | Disposition: A | Discharge status: Home / Self Care | Payer: Medicare Other | Source: Ambulatory Visit | Attending: Radiation Oncology | Admitting: Radiation Oncology

## 2015-01-12 DIAGNOSIS — Z510 Encounter for antineoplastic radiation therapy: Secondary | ICD-10-CM | POA: Diagnosis not present

## 2015-01-12 DIAGNOSIS — C3401 Malignant neoplasm of right main bronchus: Secondary | ICD-10-CM | POA: Diagnosis present

## 2015-01-13 ENCOUNTER — Other Ambulatory Visit: Payer: Medicare HMO

## 2015-01-13 ENCOUNTER — Ambulatory Visit
Admission: RE | Admit: 2015-01-13 | Discharge: 2015-01-13 | Disposition: A | Discharge status: Home / Self Care | Payer: Medicare HMO | Source: Ambulatory Visit | Attending: Radiation Oncology | Admitting: Radiation Oncology

## 2015-01-13 ENCOUNTER — Encounter: Payer: Self-pay | Admitting: *Deleted

## 2015-01-13 DIAGNOSIS — Z510 Encounter for antineoplastic radiation therapy: Secondary | ICD-10-CM | POA: Diagnosis not present

## 2015-01-13 NOTE — Progress Notes (Signed)
Pt recently hospitalized, missed lab, NP and chemo appt. Per MD pof to scheduling to r/s these appts for next week 5/17 Pof sent

## 2015-01-14 ENCOUNTER — Telehealth: Payer: Self-pay | Admitting: Internal Medicine

## 2015-01-14 ENCOUNTER — Telehealth: Payer: Self-pay | Admitting: *Deleted

## 2015-01-14 ENCOUNTER — Ambulatory Visit
Admit: 2015-01-14 | Discharge: 2015-01-14 | Disposition: A | Discharge status: Home / Self Care | Payer: Medicare Other | Attending: Radiation Oncology | Admitting: Radiation Oncology

## 2015-01-14 DIAGNOSIS — Z510 Encounter for antineoplastic radiation therapy: Secondary | ICD-10-CM | POA: Diagnosis not present

## 2015-01-14 NOTE — Telephone Encounter (Signed)
Per staff message and POF I have scheduled appts. Advised scheduler of appts. JMW  

## 2015-01-14 NOTE — Telephone Encounter (Signed)
Confirmed appointment. Sent to RN to verify Chemo and Radation.

## 2015-01-15 ENCOUNTER — Ambulatory Visit
Admit: 2015-01-15 | Discharge: 2015-01-15 | Disposition: A | Discharge status: Home / Self Care | Payer: Medicare Other | Attending: Radiation Oncology | Admitting: Radiation Oncology

## 2015-01-15 DIAGNOSIS — Z510 Encounter for antineoplastic radiation therapy: Secondary | ICD-10-CM | POA: Diagnosis not present

## 2015-01-16 ENCOUNTER — Ambulatory Visit
Admission: RE | Admit: 2015-01-16 | Discharge: 2015-01-16 | Disposition: A | Discharge status: Home / Self Care | Payer: Medicare HMO | Source: Ambulatory Visit | Attending: Radiation Oncology | Admitting: Radiation Oncology

## 2015-01-16 ENCOUNTER — Telehealth: Payer: Self-pay | Admitting: *Deleted

## 2015-01-16 ENCOUNTER — Ambulatory Visit
Admit: 2015-01-16 | Discharge: 2015-01-16 | Disposition: A | Discharge status: Home / Self Care | Payer: Medicare Other | Attending: Radiation Oncology | Admitting: Radiation Oncology

## 2015-01-16 VITALS — BP 105/77 | HR 99 | Temp 97.6°F | Wt 163.2 lb

## 2015-01-16 DIAGNOSIS — Z510 Encounter for antineoplastic radiation therapy: Secondary | ICD-10-CM | POA: Diagnosis not present

## 2015-01-16 DIAGNOSIS — C3491 Malignant neoplasm of unspecified part of right bronchus or lung: Secondary | ICD-10-CM

## 2015-01-16 NOTE — Telephone Encounter (Signed)
MR.LOVE FEELS THE CHEMOTHERAPY IS MAKING THE PT. TOO WEAK AND HE DOES NOT WANT THE PT. TO RECEIVE THE CHEMOTHERAPY ON 01/22/15. MR.LOVE WILL BE AT THE PT.'S 01/22/15 APPOINTMENT. SUGGESTED THAT MR.LOVE WRITE DOWN HIS CONCERNS TO DISCUSS WITH DR.MOHAMED AT PT'S APPOINTMENT. HE VOICES UNDERSTANDING.

## 2015-01-16 NOTE — Progress Notes (Signed)
Weekly assessment of radiation to right chest wall.completed 10 of 14 treatments.Denies pain or shortness of breath.Has non-productive cough.Mild discoloration of posterior back.Given biafine to apply if needed.Informed Yancey Flemings caretaker here with patient from [facility].

## 2015-01-16 NOTE — Progress Notes (Signed)
   Department of Radiation Oncology  Phone:  [phone] Fax:        [phone]  Weekly Treatment Note    Name: Stephen Ellis Date: 01/16/2015 MRN: [id number] DOB: May 30, 1946   Current dose: 25 Gy  Current fraction:10   MEDICATIONS: Current Outpatient Prescriptions  Medication Sig Dispense Refill  . albuterol (PROVENTIL) (2.5 MG/3ML) 0.083% nebulizer solution Take 3 mLs (2.5 mg total) by nebulization every 6 (six) hours as needed for wheezing or shortness of breath. 75 mL 0  . amLODipine (NORVASC) 10 MG tablet Take 1 tablet (10 mg total) by mouth daily. 30 tablet 2  . amoxicillin-clavulanate (AUGMENTIN) 875-125 MG per tablet Take 1 tablet by mouth every 12 (twelve) hours. 14 tablet 0  . budesonide-formoterol (SYMBICORT) 160-4.5 MCG/ACT inhaler Inhale 2 puffs into the lungs 2 (two) times daily. 1 Inhaler 12  . ipratropium-albuterol (DUONEB) 0.5-2.5 (3) MG/3ML SOLN Take 3 mLs by nebulization every 6 (six) hours. (Patient taking differently: Take 3 mLs by nebulization every 6 (six) hours as needed (SOB, wheezing). ) 360 mL 1  . Multiple Vitamin (MULTIVITAMIN WITH MINERALS) TABS tablet Take 1 tablet by mouth daily.    . Probiotic Product (PROBIOTIC DAILY PO) Take 1 tablet by mouth daily.    . prochlorperazine (COMPAZINE) 10 MG tablet Take 1 tablet (10 mg total) by mouth every 6 (six) hours as needed for nausea or vomiting. (Patient not taking: Reported on 01/02/2015) 30 tablet 0   No current facility-administered medications for this encounter.     ALLERGIES: Review of patient's allergies indicates no known allergies.   LABORATORY DATA:  Lab Results  Component Value Date   WBC 16.2* 01/05/2015   HGB 10.3* 01/05/2015   HCT 31.5* 01/05/2015   MCV 101.3* 01/05/2015   PLT 186 01/05/2015   Lab Results  Component Value Date   NA 135 01/07/2015   K 3.5 01/07/2015   CL 102 01/07/2015   CO2 24 01/07/2015   Lab Results  Component Value Date   ALT 25 01/05/2015   AST 17  01/05/2015   ALKPHOS 49 01/05/2015   BILITOT 0.8 01/05/2015     NARRATIVE: Stephen Ellis was seen today for weekly treatment management. The chart was checked and the patient's films were reviewed.  Patient is doing well. No problems currently he states. No esophagitis.  PHYSICAL EXAMINATION: vitals were not taken for this visit.     Alert, NAD  ASSESSMENT: The patient is doing satisfactorily with treatment.  PLAN: We will continue with the patient's radiation treatment as planned.        This document serves as a record of services personally performed by Kyung Rudd, MD. It was created on his behalf by Derek Mound, a trained medical scribe. The creation of this record is based on the scribe's personal observations and the provider's statements to them. This document has been checked and approved by the attending provider.

## 2015-01-19 ENCOUNTER — Ambulatory Visit
Admit: 2015-01-19 | Discharge: 2015-01-19 | Disposition: A | Discharge status: Home / Self Care | Payer: Medicare Other | Attending: Radiation Oncology | Admitting: Radiation Oncology

## 2015-01-19 DIAGNOSIS — Z510 Encounter for antineoplastic radiation therapy: Secondary | ICD-10-CM | POA: Diagnosis not present

## 2015-01-20 ENCOUNTER — Ambulatory Visit
Admit: 2015-01-20 | Discharge: 2015-01-20 | Disposition: A | Discharge status: Home / Self Care | Payer: Medicare HMO | Attending: Radiation Oncology | Admitting: Radiation Oncology

## 2015-01-20 ENCOUNTER — Telehealth: Payer: Self-pay | Admitting: Physician Assistant

## 2015-01-20 ENCOUNTER — Other Ambulatory Visit (HOSPITAL_BASED_OUTPATIENT_CLINIC_OR_DEPARTMENT_OTHER): Payer: Medicare HMO

## 2015-01-20 DIAGNOSIS — C3491 Malignant neoplasm of unspecified part of right bronchus or lung: Secondary | ICD-10-CM

## 2015-01-20 DIAGNOSIS — C3411 Malignant neoplasm of upper lobe, right bronchus or lung: Secondary | ICD-10-CM | POA: Diagnosis not present

## 2015-01-20 DIAGNOSIS — Z510 Encounter for antineoplastic radiation therapy: Secondary | ICD-10-CM | POA: Diagnosis not present

## 2015-01-20 LAB — COMPREHENSIVE METABOLIC PANEL (CC13)
ALK PHOS: 61 U/L (ref 40–150)
ALT: 57 U/L — AB (ref 0–55)
ANION GAP: 11 meq/L (ref 3–11)
AST: 25 U/L (ref 5–34)
Albumin: 2.6 g/dL — ABNORMAL LOW (ref 3.5–5.0)
BILIRUBIN TOTAL: 0.29 mg/dL (ref 0.20–1.20)
BUN: 16.4 mg/dL (ref 7.0–26.0)
CALCIUM: 9.6 mg/dL (ref 8.4–10.4)
CO2: 24 mEq/L (ref 22–29)
Chloride: 103 mEq/L (ref 98–109)
Creatinine: 1.3 mg/dL (ref 0.7–1.3)
EGFR: 67 mL/min/{1.73_m2} — ABNORMAL LOW (ref >90)
Glucose: 114 mg/dl (ref 70–140)
Potassium: 3.9 mEq/L (ref 3.5–5.1)
Sodium: 138 mEq/L (ref 136–145)
Total Protein: 8.2 g/dL (ref 6.4–8.3)

## 2015-01-20 LAB — CBC WITH DIFFERENTIAL/PLATELET
BASO%: 0.9 % (ref 0.0–2.0)
Basophils Absolute: 0 10*3/uL (ref 0.0–0.1)
EOS%: 1.7 % (ref 0.0–7.0)
Eosinophils Absolute: 0 10*3/uL (ref 0.0–0.5)
HEMATOCRIT: 35.1 % — AB (ref 38.4–49.9)
HGB: 11.6 g/dL — ABNORMAL LOW (ref 13.0–17.1)
LYMPH%: 16.3 % (ref 14.0–49.0)
MCH: 33.5 pg — AB (ref 27.2–33.4)
MCHC: 33.1 g/dL (ref 32.0–36.0)
MCV: 101.2 fL — ABNORMAL HIGH (ref 79.3–98.0)
MONO#: 0.4 10*3/uL (ref 0.1–0.9)
MONO%: 14.3 % — ABNORMAL HIGH (ref 0.0–14.0)
NEUT#: 1.7 10*3/uL (ref 1.5–6.5)
NEUT%: 66.8 % (ref 39.0–75.0)
PLATELETS: 82 10*3/uL — AB (ref 140–400)
RBC: 3.47 10*6/uL — AB (ref 4.20–5.82)
RDW: 14.2 % (ref 11.0–14.6)
WBC: 2.6 10*3/uL — ABNORMAL LOW (ref 4.0–10.3)
lymph#: 0.4 10*3/uL — ABNORMAL LOW (ref 0.9–3.3)

## 2015-01-20 NOTE — Telephone Encounter (Signed)
Pt came in for labs per CNA but his apt was for 05/19, went ahead and schedule pt for labs today while he was here and per KD in radiation pt can come on down after labs.... KJ

## 2015-01-21 ENCOUNTER — Ambulatory Visit
Admit: 2015-01-21 | Discharge: 2015-01-21 | Disposition: A | Discharge status: Home / Self Care | Payer: Medicare Other | Attending: Radiation Oncology | Admitting: Radiation Oncology

## 2015-01-21 DIAGNOSIS — Z510 Encounter for antineoplastic radiation therapy: Secondary | ICD-10-CM | POA: Diagnosis not present

## 2015-01-22 ENCOUNTER — Encounter: Payer: Self-pay | Admitting: Physician Assistant

## 2015-01-22 ENCOUNTER — Ambulatory Visit
Admit: 2015-01-22 | Discharge: 2015-01-22 | Disposition: A | Discharge status: Home / Self Care | Payer: Medicare HMO | Attending: Radiation Oncology | Admitting: Radiation Oncology

## 2015-01-22 ENCOUNTER — Other Ambulatory Visit: Payer: Self-pay | Admitting: Physician Assistant

## 2015-01-22 ENCOUNTER — Encounter: Payer: Self-pay | Admitting: Radiation Oncology

## 2015-01-22 ENCOUNTER — Telehealth: Payer: Self-pay | Admitting: Internal Medicine

## 2015-01-22 ENCOUNTER — Ambulatory Visit: Payer: Medicare HMO | Admitting: Physician Assistant

## 2015-01-22 ENCOUNTER — Ambulatory Visit: Payer: Medicare HMO

## 2015-01-22 ENCOUNTER — Other Ambulatory Visit: Payer: Medicare HMO

## 2015-01-22 ENCOUNTER — Ambulatory Visit
Admission: RE | Admit: 2015-01-22 | Discharge: 2015-01-22 | Disposition: A | Discharge status: Home / Self Care | Payer: Medicare HMO | Source: Ambulatory Visit | Attending: Radiation Oncology | Admitting: Radiation Oncology

## 2015-01-22 VITALS — BP 119/84 | HR 79 | Resp 16 | Wt 164.8 lb

## 2015-01-22 DIAGNOSIS — C3491 Malignant neoplasm of unspecified part of right bronchus or lung: Secondary | ICD-10-CM

## 2015-01-22 DIAGNOSIS — Z510 Encounter for antineoplastic radiation therapy: Secondary | ICD-10-CM | POA: Diagnosis not present

## 2015-01-22 NOTE — Telephone Encounter (Signed)
lvm fo rpt regarding to [place] appt.Marland KitchenMarland Kitchen

## 2015-01-22 NOTE — Progress Notes (Signed)
Chemotherapy is being discontinued at the request of the patient and his nephew who is his healthcare power of attorney. Dr. Julien Nordmann aware of the requests and is in agreement. Patient will complete his course of radiation therapy. We will have him return in approximately 2 months with a restaging CT scan of the chest, abdomen and pelvis with contrast to reevaluate his disease.  Carlton Adam, PA-C 01/22/2015

## 2015-01-22 NOTE — Progress Notes (Signed)
  Radiation Oncology         [phone]) [phone] ________________________________  Name: Stephen Ellis MRN: [id number]  Date: 01/22/2015  DOB: 31-May-1946    Weekly Radiation Therapy Management    ICD-9-CM ICD-10-CM   1. Bronchogenic carcinoma of right lung 162.9 C34.91     Current Dose: 35 Gy     Planned Dose:  35 Gy  Narrative . . . . . . . . The patient presents for routine under treatment assessment.                                   Weight and vitals stable. Denies pain. Denies cough, shortness of breath, or pain associated with swallowing. One month follow up appointment card given. Understand to continue Radiaplex bid for the next two week. Mild hyperpigmentation of treatment field noted. He has a rash below the belt in his pelvic region.                                 Set-up films were reviewed.                                 The chart was checked. Physical Findings. . .  weight is 164 lb 12.8 oz (74.753 kg). His blood pressure is 119/84 and his pulse is 79. His respiration is 16 and oxygen saturation is 100%. . Weight essentially stable.  No significant changes. Erythema on his back in the radiation field. Impression . . . . . . . The patient tolerated radiation.  His CTs show improved aeration of the right lung. Plan . . . . . . . . . . . . Follow up in one month.  Continue topical cream from Dr. Julien Nordmann for rash.   This document serves as a record of services personally performed by Tyler Pita, MD. It was created on his behalf by Arlyce Harman, a trained medical scribe. The creation of this record is based on the scribe's personal observations and the provider's statements to them. This document has been checked and approved by the attending provider.     ________________________________  Sheral Apley. Tammi Klippel, M.D.

## 2015-01-22 NOTE — Progress Notes (Signed)
Weight and vitals stable. Denies pain. Denies cough, shortness of breath, or pain associated with swallowing. One month follow up appointment card given. Understand to continue Radiaplex bid for the next two week. Mild hyperpigmentation of treatment field noted.

## 2015-01-25 NOTE — Progress Notes (Signed)
  Radiation Oncology         [phone]) [phone] ________________________________  Name: Stephen Ellis MRN: [id number]  Date: 01/22/2015  DOB: 1946-09-02  End of Treatment Note  Diagnosis:    69 year old gentleman with recurrent neuroendocrine carcinoma right upper lung with postobstructive pneumonia     Indication for treatment:  Palliation       Radiation treatment dates:   01/05/2015-01/22/2015  Site/dose:   The patient's tumor in the right upper lung was treated to 35 Gy in 14 fractions of 2.5 Gy  Beams/energy:   The patient was treated using a 3 field technique employing anterior and posterior beams in addition to a right sided partial arc field with dynamic collimation to centralize the radiation dose. This is a  3-D conformal radiation treatment. Image guidance after the first 5 fractions revealed complete aeration of the right lung after complete white out so the patient was re-planned.  Narrative: The patient tolerated radiation treatment relatively well.   His respiratory status remained stable or improved during radiation.  Plan: The patient has completed radiation treatment. The patient will return to radiation oncology clinic for routine followup in one month. I advised him to call or return sooner if he has any questions or concerns related to his recovery or treatment. ________________________________  Sheral Apley. Tammi Klippel, M.D.

## 2015-02-10 NOTE — Progress Notes (Signed)
  Radiation Oncology         [phone]) [phone] ________________________________  Name: Stephen Ellis MRN: [id number]  Date: 01/05/2015  DOB: [birth year]  COMPLEX SIMULATION NOTE  NARRATIVE:  The patient's image guided CT studies show that his initially completely opacified right hemithorax has now aerated completely changing the target volume for his right lung cancer. The patient underwent simulation today for ongoing radiation therapy.  A new CT study set was employed for the purpose of treatment planning.  The target and avoidance structures were contoured again and in some cases modified.  Treatment planning then occurred.  The radiation boost prescription was entered and confirmed.  A total of 3 complex treatment devices were fabricated in the form of multi-leaf collimators to shape radiation around the targets while maximally excluding nearby normal structures. I have requested : 3D Simulation  I have requested a DVH of the following structures: Right lung, left lung, heart, spinal cord, and targets.Marland Kitchen   PLAN:  This modified radiation beam arrangement is intended to continue the current radiation dose to a total 35 Gy in 14 fractions for a total cumulative dose of 2.5 Gy.  ------------------------------------------------  Sheral Apley. Tammi Klippel, M.D.

## 2015-02-11 ENCOUNTER — Encounter: Payer: Self-pay | Admitting: *Deleted

## 2015-02-19 ENCOUNTER — Ambulatory Visit
Admission: RE | Admit: 2015-02-19 | Discharge: 2015-02-19 | Disposition: A | Discharge status: Home / Self Care | Payer: Medicare HMO | Source: Ambulatory Visit | Attending: Radiation Oncology | Admitting: Radiation Oncology

## 2015-02-19 ENCOUNTER — Encounter: Payer: Self-pay | Admitting: Radiation Oncology

## 2015-02-19 VITALS — BP 121/86 | HR 81 | Temp 98.7°F | Resp 16 | Wt 164.0 lb

## 2015-02-19 DIAGNOSIS — C3491 Malignant neoplasm of unspecified part of right bronchus or lung: Secondary | ICD-10-CM

## 2015-02-19 NOTE — Progress Notes (Signed)
Radiation Oncology         [phone]) [phone] ________________________________  Name: Stephen Ellis MRN: [id number]  Date: 02/19/2015  DOB: 1946/03/09  Follow-Up Visit Note  CC: Elizabeth Palau, MD  Robbie Lis, MD  Diagnosis:   69 year old gentleman with recurrent Neuroendocrine Carcinoma right upper lung with postobstructive pneumonia  Interval Since Last Radiation:  1 month  Narrative:  The patient returns today for routine follow-up. Weight and vitals stable. Denies pain. Reports dry cough. Denies painful or difficulty swallowing. Denies hemoptysis. Denies headache, dizziness, nausea, vomiting, diarrhea. Resides at [facility]                              ALLERGIES:  has No Known Allergies.  Meds: Current Outpatient Prescriptions  Medication Sig Dispense Refill  . albuterol (PROVENTIL) (2.5 MG/3ML) 0.083% nebulizer solution Take 3 mLs (2.5 mg total) by nebulization every 6 (six) hours as needed for wheezing or shortness of breath. 75 mL 0  . amLODipine (NORVASC) 10 MG tablet Take 1 tablet (10 mg total) by mouth daily. 30 tablet 2  . guaiFENesin (ROBITUSSIN) 100 MG/5ML liquid Take 200 mg by mouth 3 (three) times daily as needed for cough.    . Multiple Vitamins-Minerals (CERTAGEN PO) Take by mouth.    . Probiotic Product (PROBIOTIC DAILY PO) Take 1 tablet by mouth daily.    . prochlorperazine (COMPAZINE) 10 MG tablet Take 1 tablet (10 mg total) by mouth every 6 (six) hours as needed for nausea or vomiting. 30 tablet 0  . budesonide-formoterol (SYMBICORT) 160-4.5 MCG/ACT inhaler Inhale 2 puffs into the lungs 2 (two) times daily. (Patient not taking: Reported on 02/19/2015) 1 Inhaler 12  . ipratropium-albuterol (DUONEB) 0.5-2.5 (3) MG/3ML SOLN Take 3 mLs by nebulization every 6 (six) hours. (Patient not taking: Reported on 02/19/2015) 360 mL 1   No current facility-administered medications for this encounter.    Physical Findings: The patient is in no acute distress. Patient is  alert and oriented.  weight is 164 lb (74.39 kg). His oral temperature is 98.7 F (37.1 C). His blood pressure is 121/86 and his pulse is 81. His respiration is 16 and oxygen saturation is 96%. .  No significant changes.  Lab Findings: Lab Results  Component Value Date   WBC 2.6* 01/20/2015   WBC 16.2* 01/05/2015   HGB 11.6* 01/20/2015   HGB 10.3* 01/05/2015   HCT 35.1* 01/20/2015   HCT 31.5* 01/05/2015   PLT 82* 01/20/2015   PLT 186 01/05/2015    Lab Results  Component Value Date   NA 138 01/20/2015   NA 135 01/07/2015   K 3.9 01/20/2015   K 3.5 01/07/2015   CHLORIDE 103 01/20/2015   CO2 24 01/20/2015   CO2 24 01/07/2015   GLUCOSE 114 01/20/2015   GLUCOSE 115* 01/07/2015   GLUCOSE 99 07/20/2012   BUN 16.4 01/20/2015   BUN 20 01/07/2015   CREATININE 1.3 01/20/2015   CREATININE 1.22 01/07/2015   BILITOT 0.29 01/20/2015   BILITOT 0.8 01/05/2015   ALKPHOS 61 01/20/2015   ALKPHOS 49 01/05/2015   AST 25 01/20/2015   AST 17 01/05/2015   ALT 57* 01/20/2015   ALT 25 01/05/2015   PROT 8.2 01/20/2015   PROT 8.6* 01/05/2015   ALBUMIN 2.6* 01/20/2015   ALBUMIN 2.7* 01/05/2015   CALCIUM 9.6 01/20/2015   CALCIUM 10.1 01/07/2015   ANIONGAP 11 01/20/2015   ANIONGAP 9 01/07/2015  Impression:  The patient is recovering from the effects of radiation.    Plan:  Chest CT has been ordered by medical oncology to assess response. Patient will f/u with radiation oncology as needed.  This document serves as a record of services personally performed by Tyler Pita, MD. It was created on his behalf by Jeralene Peters, a trained medical scribe. The creation of this record is based on the scribe's personal observations and the provider's statements to them. This document has been checked and approved by the attending provider.       _____________________________________  Sheral Apley. Tammi Klippel, M.D.

## 2015-02-19 NOTE — Progress Notes (Signed)
Weight and vitals stable. Denies pain. Reports dry cough. Denies painful or difficulty swallowing. Denies hemoptysis. Denies headache, dizziness, nausea, vomiting, diarrhea. Resides at [facility].

## 2015-02-27 ENCOUNTER — Other Ambulatory Visit: Payer: Self-pay | Admitting: Internal Medicine

## 2015-02-27 ENCOUNTER — Other Ambulatory Visit: Payer: Self-pay | Admitting: Nurse Practitioner

## 2015-03-04 ENCOUNTER — Telehealth: Payer: Self-pay | Admitting: *Deleted

## 2015-03-04 NOTE — Telephone Encounter (Signed)
Stephen Ellis with [facility] called.  Patient recently discharged from nursing home with referrals for Home Health PT,OT and nursing services.  Patient is to set up PCP service with Dr. Lucianne Lei on 03/12/15.  They will need an MD to oversee for the period of time until 03/12/15 for any questions or problems.  They are wondering if [name] would be willing to help with this until 03/12/15 when he sees [name].  Patient has already has Nursing and PT visits this week with no problems or questions.  Stephen Ellis's call back is [phone].  Will route this to [name] and his nurses.

## 2015-03-04 NOTE — Telephone Encounter (Signed)
OK as long as no massive paper work is needed. Will take care of his medical needs only.

## 2015-03-05 NOTE — Telephone Encounter (Signed)
Called and spoke with Stephen Ellis.  Let her know that Dr. Julien Nordmann has agreed to this "as long as there is no massive paper work needed. He will take care of his medical needs only."  She appreciated the call back.

## 2015-03-12 ENCOUNTER — Encounter (HOSPITAL_COMMUNITY): Payer: Self-pay | Admitting: Emergency Medicine

## 2015-03-12 ENCOUNTER — Inpatient Hospital Stay (HOSPITAL_COMMUNITY)
Admission: EM | Admit: 2015-03-12 | Discharge: 2015-03-17 | DRG: 871 | Disposition: A | Discharge status: Skilled Nursing Facility | Payer: Medicare HMO | Attending: Internal Medicine | Admitting: Internal Medicine

## 2015-03-12 ENCOUNTER — Emergency Department (HOSPITAL_COMMUNITY): Payer: Medicare HMO

## 2015-03-12 DIAGNOSIS — R69 Illness, unspecified: Secondary | ICD-10-CM

## 2015-03-12 DIAGNOSIS — R509 Fever, unspecified: Secondary | ICD-10-CM

## 2015-03-12 DIAGNOSIS — R627 Adult failure to thrive: Secondary | ICD-10-CM | POA: Diagnosis present

## 2015-03-12 DIAGNOSIS — N183 Chronic kidney disease, stage 3 unspecified: Secondary | ICD-10-CM

## 2015-03-12 DIAGNOSIS — E860 Dehydration: Secondary | ICD-10-CM | POA: Diagnosis present

## 2015-03-12 DIAGNOSIS — C3491 Malignant neoplasm of unspecified part of right bronchus or lung: Secondary | ICD-10-CM | POA: Diagnosis present

## 2015-03-12 DIAGNOSIS — A419 Sepsis, unspecified organism: Secondary | ICD-10-CM | POA: Diagnosis not present

## 2015-03-12 DIAGNOSIS — J189 Pneumonia, unspecified organism: Secondary | ICD-10-CM | POA: Diagnosis present

## 2015-03-12 DIAGNOSIS — E440 Moderate protein-calorie malnutrition: Secondary | ICD-10-CM | POA: Insufficient documentation

## 2015-03-12 DIAGNOSIS — I129 Hypertensive chronic kidney disease with stage 1 through stage 4 chronic kidney disease, or unspecified chronic kidney disease: Secondary | ICD-10-CM | POA: Diagnosis present

## 2015-03-12 DIAGNOSIS — E876 Hypokalemia: Secondary | ICD-10-CM | POA: Diagnosis present

## 2015-03-12 DIAGNOSIS — Z923 Personal history of irradiation: Secondary | ICD-10-CM

## 2015-03-12 DIAGNOSIS — Z7951 Long term (current) use of inhaled steroids: Secondary | ICD-10-CM

## 2015-03-12 DIAGNOSIS — D696 Thrombocytopenia, unspecified: Secondary | ICD-10-CM | POA: Diagnosis present

## 2015-03-12 DIAGNOSIS — Z8249 Family history of ischemic heart disease and other diseases of the circulatory system: Secondary | ICD-10-CM

## 2015-03-12 DIAGNOSIS — Z9221 Personal history of antineoplastic chemotherapy: Secondary | ICD-10-CM

## 2015-03-12 DIAGNOSIS — N179 Acute kidney failure, unspecified: Secondary | ICD-10-CM

## 2015-03-12 DIAGNOSIS — Z87891 Personal history of nicotine dependence: Secondary | ICD-10-CM

## 2015-03-12 DIAGNOSIS — I10 Essential (primary) hypertension: Secondary | ICD-10-CM | POA: Diagnosis present

## 2015-03-12 DIAGNOSIS — J690 Pneumonitis due to inhalation of food and vomit: Secondary | ICD-10-CM | POA: Diagnosis present

## 2015-03-12 LAB — CBC WITH DIFFERENTIAL/PLATELET
Basophils Absolute: 0 10*3/uL (ref 0.0–0.1)
Basophils Relative: 0 % (ref 0–1)
Eosinophils Absolute: 0 10*3/uL (ref 0.0–0.7)
Eosinophils Relative: 0 % (ref 0–5)
HEMATOCRIT: 40.7 % (ref 39.0–52.0)
HEMOGLOBIN: 13.9 g/dL (ref 13.0–17.0)
LYMPHS ABS: 0.7 10*3/uL (ref 0.7–4.0)
LYMPHS PCT: 9 % — AB (ref 12–46)
MCH: 32.6 pg (ref 26.0–34.0)
MCHC: 34.2 g/dL (ref 30.0–36.0)
MCV: 95.3 fL (ref 78.0–100.0)
MONO ABS: 0.6 10*3/uL (ref 0.1–1.0)
Monocytes Relative: 7 % (ref 3–12)
Neutro Abs: 7.4 10*3/uL (ref 1.7–7.7)
Neutrophils Relative %: 84 % — ABNORMAL HIGH (ref 43–77)
Platelets: 164 10*3/uL (ref 150–400)
RBC: 4.27 MIL/uL (ref 4.22–5.81)
RDW: 12.5 % (ref 11.5–15.5)
WBC: 8.7 10*3/uL (ref 4.0–10.5)

## 2015-03-12 LAB — URINALYSIS, ROUTINE W REFLEX MICROSCOPIC
Glucose, UA: NEGATIVE mg/dL
Hgb urine dipstick: NEGATIVE
Ketones, ur: 15 mg/dL — AB
LEUKOCYTES UA: NEGATIVE
NITRITE: NEGATIVE
PH: 5 (ref 5.0–8.0)
Protein, ur: 30 mg/dL — AB
SPECIFIC GRAVITY, URINE: 1.028 (ref 1.005–1.030)
Urobilinogen, UA: 0.2 mg/dL (ref 0.0–1.0)

## 2015-03-12 LAB — COMPREHENSIVE METABOLIC PANEL
ALBUMIN: 3.9 g/dL (ref 3.5–5.0)
ALK PHOS: 63 U/L (ref 38–126)
ALT: 14 U/L — AB (ref 17–63)
AST: 24 U/L (ref 15–41)
Anion gap: 11 (ref 5–15)
BUN: 24 mg/dL — ABNORMAL HIGH (ref 6–20)
CO2: 25 mmol/L (ref 22–32)
CREATININE: 1.81 mg/dL — AB (ref 0.61–1.24)
Calcium: 10.4 mg/dL — ABNORMAL HIGH (ref 8.9–10.3)
Chloride: 101 mmol/L (ref 101–111)
GFR calc Af Amer: 43 mL/min — ABNORMAL LOW (ref >60)
GFR calc non Af Amer: 37 mL/min — ABNORMAL LOW (ref >60)
GLUCOSE: 120 mg/dL — AB (ref 65–99)
Potassium: 4 mmol/L (ref 3.5–5.1)
SODIUM: 137 mmol/L (ref 135–145)
Total Bilirubin: 0.8 mg/dL (ref 0.3–1.2)
Total Protein: 11 g/dL — ABNORMAL HIGH (ref 6.5–8.1)

## 2015-03-12 LAB — URINE MICROSCOPIC-ADD ON

## 2015-03-12 MED ORDER — SODIUM CHLORIDE 0.9 % IV BOLUS (SEPSIS)
500.0000 mL | Freq: Once | INTRAVENOUS | Status: AC
  Administered 2015-03-12: 500 mL via INTRAVENOUS

## 2015-03-12 MED ORDER — SODIUM CHLORIDE 0.9 % IV SOLN
INTRAVENOUS | Status: DC
  Administered 2015-03-12: 100 mL/h via INTRAVENOUS

## 2015-03-12 NOTE — ED Notes (Signed)
Pt states last chemo treatment in April, last radiation in May.

## 2015-03-12 NOTE — ED Provider Notes (Signed)
CSN: [id number]     Arrival date & time 03/12/15  1741 History   First MD Initiated Contact with Patient 03/12/15 1845     Chief Complaint  Patient presents with  . Fever  . Dehydration     (Consider location/radiation/quality/duration/timing/severity/associated sxs/prior Treatment) HPI The patient has a history of lung cancer. He had radiation therapy which impacted his esophageal function. Last chemotherapy and radiation was in April and May respectively. Family members report the patient has seemed less energetic over these past 2 days. He's been sleeping more than usual. He has known difficulty swallowing due to radiation complications, however the past couple of days he's had increased difficulty with oral intake. In morning seemed to have some difficulty swallowing larger vitamin pills, coughed/choked on his water with the pills. Patient's son concerned for aspiration. Later aroung lunch was able to eat small amounts of variety of foods at [employer] (beans/ckicken/collards in small amount). Routine PCP f/u in afternoon with new PCP (Dr. Darlyne Russian). In office temp 100.3 and patient coughed/vomited up small amount of clear material that son reports looked like saliva. Past Medical History  Diagnosis Date  . Syncope   . Dehydration   . Blind left eye   . Hypertension   . Anemia     "low Blood"  . Dementia     poor memory  . Cancer     Lung  . Lung cancer 08/12/14    Right lung   Past Surgical History  Procedure Laterality Date  . No past surgeries    . Video bronchoscopy Bilateral 07/29/2014    Procedure: VIDEO BRONCHOSCOPY WITHOUT FLUORO;  Surgeon: Tanda Rockers, MD;  Location: [facility] ENDOSCOPY;  Service: Cardiopulmonary;  Laterality: Bilateral;  . Video bronchoscopy Bilateral 08/12/2014    Procedure: VIDEO BRONCHOSCOPY WITHOUT FLUORO;  Surgeon: Wilhelmina Mcardle, MD;  Location: [facility] ENDOSCOPY;  Service: Cardiopulmonary;  Laterality: Bilateral;   Family History  Problem Relation  Age of Onset  . Hypertension Brother    History  Substance Use Topics  . Smoking status: Former Smoker -- 1.00 packs/day for 38 years    Quit date: 06/24/2014  . Smokeless tobacco: Never Used  . Alcohol Use: No    Review of Systems 10 Systems reviewed and are negative for acute change except as noted in the HPI.    Allergies  Review of patient's allergies indicates no known allergies.  Home Medications   Prior to Admission medications   Medication Sig Start Date End Date Taking? Authorizing Provider  acetaminophen (TYLENOL) 325 MG tablet Take 325-650 mg by mouth every 6 (six) hours as needed for moderate pain or fever.   Yes Historical Provider, MD  albuterol (PROVENTIL) (2.5 MG/3ML) 0.083% nebulizer solution Take 3 mLs (2.5 mg total) by nebulization every 6 (six) hours as needed for wheezing or shortness of breath. 10/19/14  Yes Ezequiel Essex, MD  amLODipine (NORVASC) 10 MG tablet Take 1 tablet (10 mg total) by mouth daily. 10/14/14  Yes Susanne Borders, NP  budesonide-formoterol (SYMBICORT) 160-4.5 MCG/ACT inhaler Inhale 2 puffs into the lungs 2 (two) times daily. 10/14/14  Yes Susanne Borders, NP  guaiFENesin (ROBITUSSIN) 100 MG/5ML liquid Take 200 mg by mouth 3 (three) times daily as needed for cough.   Yes Historical Provider, MD  ipratropium-albuterol (DUONEB) 0.5-2.5 (3) MG/3ML SOLN Take 3 mLs by nebulization every 6 (six) hours. 08/09/14  Yes Thurnell Lose, MD  Multiple Vitamins-Minerals (CENTROVITE) TABS TAKE 1 TABLET BY MOUTH ONCE  DAILY 02/27/15  Yes Curt Bears, MD  Multiple Vitamins-Minerals (CERTAGEN PO) Take by mouth.   Yes Historical Provider, MD  Probiotic Product (PROBIOTIC DAILY PO) Take 1 tablet by mouth daily.   Yes Historical Provider, MD  prochlorperazine (COMPAZINE) 10 MG tablet TAKE 1 TABLET EVERY 6 HOURS AS NEEDED FOR NAUSEA OR VOMITING 02/27/15  Yes Curt Bears, MD   BP 124/85 mmHg  Pulse 102  Temp(Src) 98 F (36.7 C) (Oral)  Resp 27  SpO2  92% Physical Exam  Constitutional:  The patient is thin. He is alert and nontoxic. He does not have respiratory distress.  HENT:  Head: Normocephalic and atraumatic.  Mouth/Throat: Oropharynx is clear and moist.  Posterior oropharynx is clear. No pooling of secretions. Respirations are normal.  Eyes: EOM are normal.  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  Neck: Neck supple.  Cardiovascular: Normal rate, regular rhythm, normal heart sounds and intact distal pulses.   Pulmonary/Chest: Effort normal. No stridor. No respiratory distress. He has no wheezes. He has no rales.  Breath sounds in the right upper lobe sounds transmitted. Otherwise no gross wheeze rhonchi or rail.  Abdominal: Soft. Bowel sounds are normal. He exhibits no distension. There is no tenderness.  Musculoskeletal: Normal range of motion.  Lymphadenopathy:    He has no cervical adenopathy.  Neurological: He is alert. He exhibits normal muscle tone.  Skin: Skin is warm and dry.  Psychiatric: He has a normal mood and affect.    ED Course  Procedures (including critical care time) Labs Review Labs Reviewed  CBC WITH DIFFERENTIAL/PLATELET - Abnormal; Notable for the following:    Neutrophils Relative % 84 (*)    Lymphocytes Relative 9 (*)    All other components within normal limits  COMPREHENSIVE METABOLIC PANEL - Abnormal; Notable for the following:    Glucose, Bld 120 (*)    BUN 24 (*)    Creatinine, Ser 1.81 (*)    Calcium 10.4 (*)    Total Protein 11.0 (*)    ALT 14 (*)    GFR calc non Af Amer 37 (*)    GFR calc Af Amer 43 (*)    All other components within normal limits  URINALYSIS, ROUTINE W REFLEX MICROSCOPIC (NOT AT [facility] - Abnormal; Notable for the following:    Color, Urine AMBER (*)    Bilirubin Urine SMALL (*)    Ketones, ur 15 (*)    Protein, ur 30 (*)    All other components within normal limits  CULTURE, BLOOD (ROUTINE X 2)  CULTURE, BLOOD (ROUTINE X 2)  URINE CULTURE  URINE MICROSCOPIC-ADD  ON    Imaging Review Dg Chest 2 View  03/12/2015   CLINICAL DATA:  Current history of right upper lobe lung cancer, presenting with acute fever and dehydration.  EXAM: CHEST  2 VIEW  COMPARISON:  CT chest 01/04/2015 and earlier. Chest x-rays 01/02/2015 and earlier.  FINDINGS: AP semi-erect and lateral images were obtained. Cardiac silhouette normal in size, unchanged. Central right upper lobe mass extending into the right hilum with postobstructive atelectasis involving the right upper lobe, unchanged. Persistent or recurrent patchy airspace opacities in the right lower lobe. Lungs remain clear otherwise. No pleural effusions. Pulmonary vascularity normal. No pneumothorax. Degenerative changes and DISH involving the thoracic spine.  IMPRESSION: 1. Residual or recurrent pneumonia involving the right lower lobe since the 01/02/2015 chest x-ray. 2. Stable central right upper lobe lung mass extending into the right hilum with postobstructive atelectasis involving the right  upper lobe.   Electronically Signed   By: Evangeline Dakin M.D.   On: 03/12/2015 18:22     EKG Interpretation None      MDM   Final diagnoses:  Aspiration pneumonia, unspecified aspiration pneumonia type  Severe comorbid illness       Charlesetta Shanks, MD 03/13/15 [phone]

## 2015-03-12 NOTE — ED Notes (Signed)
Pt with Hx of lung cancer sent from cancer center for fever and dehydration. Pt unable to verbalize due to past treatments, but family member states that pt is mentally aware. Pt oral temperature 100 degrees F; however, pt was unable to hold thermometer in mouth well.

## 2015-03-12 NOTE — ED Notes (Signed)
Bed: OE69 Expected date:  Expected time:  Means of arrival:  Comments: TR 2 FEVER/IMMUNOCOMPROMISE

## 2015-03-12 NOTE — ED Notes (Signed)
Gina from poison control called and got an update on patient.

## 2015-03-12 NOTE — ED Notes (Signed)
Requested patient to urinate. Patient's diaper was changed.

## 2015-03-13 ENCOUNTER — Encounter (HOSPITAL_COMMUNITY): Payer: Self-pay

## 2015-03-13 DIAGNOSIS — J690 Pneumonitis due to inhalation of food and vomit: Secondary | ICD-10-CM | POA: Diagnosis not present

## 2015-03-13 DIAGNOSIS — N179 Acute kidney failure, unspecified: Secondary | ICD-10-CM

## 2015-03-13 DIAGNOSIS — E440 Moderate protein-calorie malnutrition: Secondary | ICD-10-CM | POA: Diagnosis present

## 2015-03-13 DIAGNOSIS — Z8249 Family history of ischemic heart disease and other diseases of the circulatory system: Secondary | ICD-10-CM | POA: Diagnosis not present

## 2015-03-13 DIAGNOSIS — I10 Essential (primary) hypertension: Secondary | ICD-10-CM | POA: Diagnosis not present

## 2015-03-13 DIAGNOSIS — E860 Dehydration: Secondary | ICD-10-CM | POA: Diagnosis present

## 2015-03-13 DIAGNOSIS — A419 Sepsis, unspecified organism: Secondary | ICD-10-CM | POA: Diagnosis present

## 2015-03-13 DIAGNOSIS — E876 Hypokalemia: Secondary | ICD-10-CM | POA: Diagnosis present

## 2015-03-13 DIAGNOSIS — D696 Thrombocytopenia, unspecified: Secondary | ICD-10-CM | POA: Diagnosis present

## 2015-03-13 DIAGNOSIS — N183 Chronic kidney disease, stage 3 unspecified: Secondary | ICD-10-CM

## 2015-03-13 DIAGNOSIS — C3491 Malignant neoplasm of unspecified part of right bronchus or lung: Secondary | ICD-10-CM | POA: Diagnosis present

## 2015-03-13 DIAGNOSIS — Z923 Personal history of irradiation: Secondary | ICD-10-CM | POA: Diagnosis not present

## 2015-03-13 DIAGNOSIS — I129 Hypertensive chronic kidney disease with stage 1 through stage 4 chronic kidney disease, or unspecified chronic kidney disease: Secondary | ICD-10-CM | POA: Diagnosis present

## 2015-03-13 DIAGNOSIS — R627 Adult failure to thrive: Secondary | ICD-10-CM | POA: Diagnosis present

## 2015-03-13 DIAGNOSIS — Z7951 Long term (current) use of inhaled steroids: Secondary | ICD-10-CM | POA: Diagnosis not present

## 2015-03-13 DIAGNOSIS — R509 Fever, unspecified: Secondary | ICD-10-CM | POA: Diagnosis present

## 2015-03-13 DIAGNOSIS — Z87891 Personal history of nicotine dependence: Secondary | ICD-10-CM | POA: Diagnosis not present

## 2015-03-13 DIAGNOSIS — Z9221 Personal history of antineoplastic chemotherapy: Secondary | ICD-10-CM | POA: Diagnosis not present

## 2015-03-13 LAB — LEGIONELLA ANTIGEN, URINE

## 2015-03-13 LAB — CBC
HCT: 35.3 % — ABNORMAL LOW (ref 39.0–52.0)
Hemoglobin: 11.7 g/dL — ABNORMAL LOW (ref 13.0–17.0)
MCH: 32.1 pg (ref 26.0–34.0)
MCHC: 33.1 g/dL (ref 30.0–36.0)
MCV: 97 fL (ref 78.0–100.0)
PLATELETS: 138 10*3/uL — AB (ref 150–400)
RBC: 3.64 MIL/uL — ABNORMAL LOW (ref 4.22–5.81)
RDW: 12.6 % (ref 11.5–15.5)
WBC: 9.8 10*3/uL (ref 4.0–10.5)

## 2015-03-13 LAB — BASIC METABOLIC PANEL
Anion gap: 7 (ref 5–15)
BUN: 21 mg/dL — ABNORMAL HIGH (ref 6–20)
CHLORIDE: 106 mmol/L (ref 101–111)
CO2: 25 mmol/L (ref 22–32)
Calcium: 9.4 mg/dL (ref 8.9–10.3)
Creatinine, Ser: 1.66 mg/dL — ABNORMAL HIGH (ref 0.61–1.24)
GFR calc Af Amer: 48 mL/min — ABNORMAL LOW (ref >60)
GFR calc non Af Amer: 41 mL/min — ABNORMAL LOW (ref >60)
Glucose, Bld: 107 mg/dL — ABNORMAL HIGH (ref 65–99)
POTASSIUM: 3.7 mmol/L (ref 3.5–5.1)
SODIUM: 138 mmol/L (ref 135–145)

## 2015-03-13 LAB — MRSA PCR SCREENING: MRSA by PCR: NEGATIVE

## 2015-03-13 LAB — STREP PNEUMONIAE URINARY ANTIGEN: STREP PNEUMO URINARY ANTIGEN: NEGATIVE

## 2015-03-13 MED ORDER — CETYLPYRIDINIUM CHLORIDE 0.05 % MT LIQD
7.0000 mL | Freq: Two times a day (BID) | OROMUCOSAL | Status: DC
  Administered 2015-03-14 – 2015-03-17 (×6): 7 mL via OROMUCOSAL

## 2015-03-13 MED ORDER — SENNOSIDES-DOCUSATE SODIUM 8.6-50 MG PO TABS: 1.0000 | ORAL_TABLET | Freq: Every evening | ORAL | Status: DC | PRN

## 2015-03-13 MED ORDER — SODIUM CHLORIDE 0.9 % IV SOLN
INTRAVENOUS | Status: DC
  Administered 2015-03-13: 22:00:00 via INTRAVENOUS

## 2015-03-13 MED ORDER — SODIUM CHLORIDE 0.9 % IV SOLN: INTRAVENOUS | Status: DC

## 2015-03-13 MED ORDER — ACETAMINOPHEN 325 MG PO TABS
650.0000 mg | ORAL_TABLET | Freq: Four times a day (QID) | ORAL | Status: DC | PRN
  Administered 2015-03-15 (×2): 650 mg via ORAL
  Filled 2015-03-13 (×2): qty 2

## 2015-03-13 MED ORDER — ALBUTEROL SULFATE (2.5 MG/3ML) 0.083% IN NEBU: 2.5000 mg | INHALATION_SOLUTION | RESPIRATORY_TRACT | Status: DC | PRN

## 2015-03-13 MED ORDER — HEPARIN SODIUM (PORCINE) 5000 UNIT/ML IJ SOLN
5000.0000 [IU] | Freq: Three times a day (TID) | INTRAMUSCULAR | Status: DC
  Administered 2015-03-13 – 2015-03-17 (×13): 5000 [IU] via SUBCUTANEOUS
  Filled 2015-03-13 (×17): qty 1

## 2015-03-13 MED ORDER — BUDESONIDE-FORMOTEROL FUMARATE 160-4.5 MCG/ACT IN AERO
2.0000 | INHALATION_SPRAY | Freq: Two times a day (BID) | RESPIRATORY_TRACT | Status: DC
  Administered 2015-03-13 – 2015-03-17 (×9): 2 via RESPIRATORY_TRACT
  Filled 2015-03-13: qty 6

## 2015-03-13 MED ORDER — ADULT MULTIVITAMIN W/MINERALS CH
1.0000 | ORAL_TABLET | Freq: Every day | ORAL | Status: DC
  Administered 2015-03-14 – 2015-03-17 (×4): 1 via ORAL
  Filled 2015-03-13 (×5): qty 1

## 2015-03-13 MED ORDER — OXYCODONE HCL 5 MG PO TABS: 5.0000 mg | ORAL_TABLET | ORAL | Status: DC | PRN

## 2015-03-13 MED ORDER — RESOURCE THICKENUP CLEAR PO POWD
ORAL | Status: DC | PRN
  Filled 2015-03-13: qty 125

## 2015-03-13 MED ORDER — PIPERACILLIN-TAZOBACTAM 3.375 G IVPB 30 MIN
3.3750 g | Freq: Once | INTRAVENOUS | Status: AC
  Administered 2015-03-13: 3.375 g via INTRAVENOUS
  Filled 2015-03-13: qty 50

## 2015-03-13 MED ORDER — CHLORHEXIDINE GLUCONATE 0.12 % MT SOLN
15.0000 mL | Freq: Two times a day (BID) | OROMUCOSAL | Status: DC
  Administered 2015-03-13 – 2015-03-16 (×3): 15 mL via OROMUCOSAL
  Filled 2015-03-13 (×11): qty 15

## 2015-03-13 MED ORDER — ACETAMINOPHEN 650 MG RE SUPP: 650.0000 mg | Freq: Four times a day (QID) | RECTAL | Status: DC | PRN

## 2015-03-13 MED ORDER — PIPERACILLIN-TAZOBACTAM 3.375 G IVPB
3.3750 g | Freq: Three times a day (TID) | INTRAVENOUS | Status: DC
  Administered 2015-03-13 – 2015-03-17 (×13): 3.375 g via INTRAVENOUS
  Filled 2015-03-13 (×15): qty 50

## 2015-03-13 MED ORDER — ONDANSETRON HCL 4 MG/2ML IJ SOLN
4.0000 mg | Freq: Four times a day (QID) | INTRAMUSCULAR | Status: DC | PRN
  Administered 2015-03-13: 4 mg via INTRAVENOUS
  Filled 2015-03-13: qty 2

## 2015-03-13 MED ORDER — ONDANSETRON HCL 4 MG PO TABS: 4.0000 mg | ORAL_TABLET | Freq: Four times a day (QID) | ORAL | Status: DC | PRN

## 2015-03-13 NOTE — H&P (Signed)
[facility]          History and Physical    PCP:   Elizabeth Palau, MD   EDP: Lissa Morales, M.D.  Chief Complaint:  Per her nephew has been having difficulty swallowing, lethargic  HPI: Patient is a 69 year old man with history of lung cancer status post chemotherapy in April and radiation that finished in May with a history of postobstructive pneumonia and difficulty swallowing who presents to the hospital today with the above complaints. Patient's nephew, [name], is his healthcare power of attorney and is at bedside and provides most of the information as patient is having difficulty talking. [name] states that for the past day and a half his "demeanor has been off" he seems more sleepy than usual. He has been having difficulty eating for the past 3-4 days. Today they went to see a new primary care physician and in the physician's office he was noted to be slumped over the chair and have a temp of 100.3. They decided to bring him into the hospital for evaluation where a chest x-ray shows residual or recurrent pneumonia involving the right lower lobe. Stable central right upper lobe lung mass extending into the right hilum with postobstructive atelectasis involving the right upper lobe. He is currently hemodynamically stable and maintaining good oxygen saturations on room air. He has some renal failure above his baseline with a creatinine of 1.81. We have been asked to admit him for further evaluation and management.  Allergies:  No Known Allergies    Past Medical History  Diagnosis Date  . Syncope   . Dehydration   . Blind left eye   . Hypertension   . Anemia     "low Blood"  . Dementia     poor memory  . Cancer     Lung  . Lung cancer 08/12/14    Right lung    Past Surgical History  Procedure Laterality Date  . No past surgeries    . Video bronchoscopy Bilateral 07/29/2014    Procedure: VIDEO BRONCHOSCOPY WITHOUT FLUORO;  Surgeon:  Tanda Rockers, MD;  Location: [facility] ENDOSCOPY;  Service: Cardiopulmonary;  Laterality: Bilateral;  . Video bronchoscopy Bilateral 08/12/2014    Procedure: VIDEO BRONCHOSCOPY WITHOUT FLUORO;  Surgeon: Wilhelmina Mcardle, MD;  Location: [facility] ENDOSCOPY;  Service: Cardiopulmonary;  Laterality: Bilateral;    Prior to Admission medications   Medication Sig Start Date End Date Taking? Authorizing Provider  acetaminophen (TYLENOL) 325 MG tablet Take 325-650 mg by mouth every 6 (six) hours as needed for moderate pain or fever.   Yes Historical Provider, MD  albuterol (PROVENTIL) (2.5 MG/3ML) 0.083% nebulizer solution Take 3 mLs (2.5 mg total) by nebulization every 6 (six) hours as needed for wheezing or shortness of breath. 10/19/14  Yes Ezequiel Essex, MD  amLODipine (NORVASC) 10 MG tablet Take 1 tablet (10 mg total) by mouth daily. 10/14/14  Yes Susanne Borders, NP  budesonide-formoterol (SYMBICORT) 160-4.5 MCG/ACT inhaler Inhale 2 puffs into the lungs 2 (two) times daily. 10/14/14  Yes Susanne Borders, NP  guaiFENesin (ROBITUSSIN) 100 MG/5ML liquid Take 200 mg by mouth 3 (three) times daily as needed for cough.   Yes Historical Provider, MD  ipratropium-albuterol (DUONEB) 0.5-2.5 (3) MG/3ML SOLN Take 3 mLs by nebulization every 6 (six) hours. 08/09/14  Yes Thurnell Lose, MD  Multiple Vitamins-Minerals (CENTROVITE) TABS TAKE 1 TABLET BY MOUTH ONCE DAILY 02/27/15  Yes Curt Bears, MD  Multiple Vitamins-Minerals (CERTAGEN PO) Take by mouth.   Yes Historical Provider, MD  Probiotic Product (PROBIOTIC DAILY PO) Take 1 tablet by mouth daily.   Yes Historical Provider, MD  prochlorperazine (COMPAZINE) 10 MG tablet TAKE 1 TABLET EVERY 6 HOURS AS NEEDED FOR NAUSEA OR VOMITING 02/27/15  Yes Curt Bears, MD    Social History:  reports that he quit smoking about 8 months ago. He has never used smokeless tobacco. He reports that he does not drink alcohol or use illicit drugs.  Family History  Problem Relation Age of  Onset  . Hypertension Brother     Review of Systems:  Unable to obtain given her current mental state and difficulty talking  Physical Exam: Blood pressure 124/85, pulse 102, temperature 98 F (36.7 C), temperature source Oral, resp. rate 27, SpO2 92 %. General: Awake, can answer very simple questions although is extremely difficult to understand. HEENT: Normocephalic, atraumatic, pupils equal round and reactive to light, extraocular movements intact, very dry mucous membranes with cracked lips and tongue. Neck: Supple, no JVD, bruits, no goiter. Cardiovascular: Tachycardic, regular, no murmurs, rubs or gallops. Lungs: Decreased breath sounds in the right lung fields both upper and lower, no wheezes or crackles. Abdomen: Soft, nontender, nondistended, positive bowel sounds. Extremities: No clubbing, cyanosis or edema, positive pulses. Neurologic: Moves all 4 spontaneously.  Labs on Admission:  Results for orders placed or performed during the hospital encounter of 03/12/15 (from the past 48 hour(s))  CBC WITH DIFFERENTIAL     Status: Abnormal   Collection Time: 03/12/15  6:11 PM  Result Value Ref Range   WBC 8.7 4.0 - 10.5 K/uL   RBC 4.27 4.22 - 5.81 MIL/uL   Hemoglobin 13.9 13.0 - 17.0 g/dL   HCT 40.7 39.0 - 52.0 %   MCV 95.3 78.0 - 100.0 fL   MCH 32.6 26.0 - 34.0 pg   MCHC 34.2 30.0 - 36.0 g/dL   RDW 12.5 11.5 - 15.5 %   Platelets 164 150 - 400 K/uL   Neutrophils Relative % 84 (H) 43 - 77 %   Neutro Abs 7.4 1.7 - 7.7 K/uL   Lymphocytes Relative 9 (L) 12 - 46 %   Lymphs Abs 0.7 0.7 - 4.0 K/uL   Monocytes Relative 7 3 - 12 %   Monocytes Absolute 0.6 0.1 - 1.0 K/uL   Eosinophils Relative 0 0 - 5 %   Eosinophils Absolute 0.0 0.0 - 0.7 K/uL   Basophils Relative 0 0 - 1 %   Basophils Absolute 0.0 0.0 - 0.1 K/uL  Comprehensive metabolic panel     Status: Abnormal   Collection Time: 03/12/15  6:11 PM  Result Value Ref Range   Sodium 137 135 - 145 mmol/L   Potassium 4.0 3.5 -  5.1 mmol/L   Chloride 101 101 - 111 mmol/L   CO2 25 22 - 32 mmol/L   Glucose, Bld 120 (H) 65 - 99 mg/dL   BUN 24 (H) 6 - 20 mg/dL   Creatinine, Ser 1.81 (H) 0.61 - 1.24 mg/dL   Calcium 10.4 (H) 8.9 - 10.3 mg/dL   Total Protein 11.0 (H) 6.5 - 8.1 g/dL   Albumin 3.9 3.5 - 5.0 g/dL   AST 24 15 - 41 U/L   ALT 14 (L) 17 - 63 U/L   Alkaline Phosphatase 63 38 - 126 U/L   Total Bilirubin 0.8 0.3 - 1.2 mg/dL   GFR calc non Af Amer 37 (  L) >60 mL/min   GFR calc Af Amer 43 (L) >60 mL/min    Comment: (NOTE) The eGFR has been calculated using the CKD EPI equation. This calculation has not been validated in all clinical situations. eGFR's persistently <60 mL/min signify possible Chronic Kidney Disease.    Anion gap 11 5 - 15  Urinalysis with microscopic     Status: Abnormal   Collection Time: 03/12/15  8:23 PM  Result Value Ref Range   Color, Urine AMBER (A) YELLOW    Comment: BIOCHEMICALS MAY BE AFFECTED BY COLOR   APPearance CLEAR CLEAR   Specific Gravity, Urine 1.028 1.005 - 1.030   pH 5.0 5.0 - 8.0   Glucose, UA NEGATIVE NEGATIVE mg/dL   Hgb urine dipstick NEGATIVE NEGATIVE   Bilirubin Urine SMALL (A) NEGATIVE   Ketones, ur 15 (A) NEGATIVE mg/dL   Protein, ur 30 (A) NEGATIVE mg/dL   Urobilinogen, UA 0.2 0.0 - 1.0 mg/dL   Nitrite NEGATIVE NEGATIVE   Leukocytes, UA NEGATIVE NEGATIVE  Urine microscopic-add on     Status: None   Collection Time: 03/12/15  8:23 PM  Result Value Ref Range   RBC / HPF 0-2 <3 RBC/hpf   Bacteria, UA RARE RARE   Urine-Other MUCOUS PRESENT     Radiological Exams on Admission: Dg Chest 2 View  03/12/2015   CLINICAL DATA:  Current history of right upper lobe lung cancer, presenting with acute fever and dehydration.  EXAM: CHEST  2 VIEW  COMPARISON:  CT chest 01/04/2015 and earlier. Chest x-rays 01/02/2015 and earlier.  FINDINGS: AP semi-erect and lateral images were obtained. Cardiac silhouette normal in size, unchanged. Central right upper lobe mass extending  into the right hilum with postobstructive atelectasis involving the right upper lobe, unchanged. Persistent or recurrent patchy airspace opacities in the right lower lobe. Lungs remain clear otherwise. No pleural effusions. Pulmonary vascularity normal. No pneumothorax. Degenerative changes and DISH involving the thoracic spine.  IMPRESSION: 1. Residual or recurrent pneumonia involving the right lower lobe since the 01/02/2015 chest x-ray. 2. Stable central right upper lobe lung mass extending into the right hilum with postobstructive atelectasis involving the right upper lobe.   Electronically Signed   By: Evangeline Dakin M.D.   On: 03/12/2015 18:22    Assessment/Plan Principal Problem:   Aspiration pneumonia Active Problems:   Bronchogenic carcinoma of right lung   Postobstructive pneumonia   Essential hypertension   CKD (chronic kidney disease) stage 3, GFR 30-59 ml/min   ARF (acute renal failure)    Aspiration pneumonia  -This likely accounts for his demeanor as well as his temperature. -Aspiration is a most likely source of his pneumonia given location on x-ray and history of difficulty swallowing, although postobstructive pneumonia is also a possibility. -We'll place on Zosyn to cover anaerobic pathogens. -We'll request speech therapy evaluation for swallowing assessment. -Blood cultures and sputum cultures have been ordered. -Strep pneumo and legionella urine antigens have been ordered.  Bronchogenic cancer of the right lung -Have notified Dr. Mckinley Jewel via Epic of patient's admission. -He is on a "holiday" from treatment at the moment. -Chest x-ray shows that mass is stable.  Acute on chronic kidney disease stage III -It appears baseline creatinine is between 1.2 and 1.3. -Creatinine was 1.8 on admission, suspect related to prerenal azotemia given decreased oral intake. -Placed on normal saline 100 mL an hour and recheck renal function in the morning.  Hypertension -We'll  hold amlodipine for now.  DVT prophylaxis -Subcutaneous heparin.  CODE STATUS -Full code as discussed with patient and nephew at bedside.    Time Spent on Admission: 85 minutes  [facility] Pager: [phone] 03/13/2015, 12:33 AM

## 2015-03-13 NOTE — Progress Notes (Signed)
Pt seen and examined at bedside. Please see earlier admission note by Dr. Jerilee Hoh as pt was admitted after midnight. Pt was admitted for evaluation of difficulty swallowing, AMS, ? Aspiration PNA. Agree with Zosyn for ABX coverage. Pt is clinically stable this AM, no specific concerns, we are awaiting for SLP. Repeat BMP and CBC in AM.  Faye Ramsay, MD  [facility] Pager [phone] Cell [phone]  If 7PM-7AM, please contact night-coverage [email or web address] Password [email or web address]

## 2015-03-13 NOTE — Progress Notes (Signed)
ANTIBIOTIC CONSULT NOTE - INITIAL  Pharmacy Consult for zosyn Indication: asp. PNA  No Known Allergies  Patient Measurements: Height: '5\' 8"'$  (172.7 cm) (manually measure him) Weight: 155 lb 3.3 oz (70.4 kg) IBW/kg (Calculated) : 68.4 Adjusted Body Weight:   Vital Signs: Temp: 98.4 F (36.9 C) (07/08 0114) Temp Source: Axillary (07/08 0114) BP: 121/81 mmHg (07/08 0114) Pulse Rate: 91 (07/08 0114) Intake/Output from previous day: 07/07 0701 - 07/08 0700 In: 500 [IV Piggyback:500] Out: 200 [Urine:200] Intake/Output from this shift: Total I/O In: 500 [IV Piggyback:500] Out: 200 [Urine:200]  Labs:  Recent Labs  03/12/15 1811  WBC 8.7  HGB 13.9  PLT 164  CREATININE 1.81*   Estimated Creatinine Clearance: 38.3 mL/min (by C-G formula based on Cr of 1.81). No results for input(s): VANCOTROUGH, VANCOPEAK, VANCORANDOM, GENTTROUGH, GENTPEAK, GENTRANDOM, TOBRATROUGH, TOBRAPEAK, TOBRARND, AMIKACINPEAK, AMIKACINTROU, AMIKACIN in the last 72 hours.   Microbiology: No results found for this or any previous visit (from the past 720 hour(s)).  Medical History: Past Medical History  Diagnosis Date  . Syncope   . Dehydration   . Blind left eye   . Hypertension   . Anemia     "low Blood"  . Dementia     poor memory  . Cancer     Lung  . Lung cancer 08/12/14    Right lung    Medications:  Anti-infectives    Start     Dose/Rate Route Frequency Ordered Stop   03/13/15 0600  piperacillin-tazobactam (ZOSYN) IVPB 3.375 g     3.375 g 12.5 mL/hr over 240 Minutes Intravenous 3 times per day 03/13/15 0253     03/13/15 0015  piperacillin-tazobactam (ZOSYN) IVPB 3.375 g     3.375 g 100 mL/hr over 30 Minutes Intravenous  Once 03/13/15 0008 03/13/15 0044     Assessment: Patient with Asp. PNA, very poor renal function. First dose of antibiotics already given in ED.   Goal of Therapy:  Zosyn based on renal function   Plan:  Follow up culture results  Zosyn 3.375g IV Q8H  infused over 4hrs.   Tyler Deis, Shea Stakes Crowford 03/13/2015,2:55 AM

## 2015-03-13 NOTE — ED Notes (Signed)
Called to give report to nurse. Secretary stated nurse will call back.

## 2015-03-13 NOTE — Progress Notes (Addendum)
Initial Nutrition Assessment  DOCUMENTATION CODES:  Non-severe (moderate) malnutrition in context of chronic illness  INTERVENTION: - Will order Magic cup BID with meals, each supplement provides 290 kcal and 9 grams of protein - RD will continue to monitor for needs  NUTRITION DIAGNOSIS:  Increased nutrient needs related to cancer and cancer related treatments as evidenced by estimated needs.  GOAL:  Patient will meet greater than or equal to 90% of their needs  MONITOR:  PO intake, Supplement acceptance, Weight trends, Labs, I & O's  REASON FOR ASSESSMENT:  Consult Assessment of nutrition requirement/status  ASSESSMENT: 69 year old man with history of lung cancer status post chemotherapy in April and radiation that finished in May with a history of postobstructive pneumonia and difficulty swallowing.  For the past day and a half his "demeanor has been off" he seems more sleepy than usual. He has been having difficulty eating for the past 3-4 days.   Pt seen for consult. BMI indicates normal weight status. Pt mumbles mainly and unable to provide detailed information more than 1-2 words. No visitors present.   Diet advanced per SLP recommendations this AM; dysphagia 1 with nectar-thick liquids; pt received chocolate milk and butter milk with no thickeners for lunch. Will order Resource Thicken Up. Will also order Magic Cup BID to supplement.   Physical assessment shows mild to moderate muscle and fat wasting; severe muscle wasting noted to knee cap. Per weight hx review, pt has lost 9 lbs (5% body weight) in the past 3 weeks which is significant for time frame.  Likely not meeting needs PTA. Medications reviewed. Labs reviewed; BUN/creatinine elevated, GFR: 48.  Height:  Ht Readings from Last 1 Encounters:  03/13/15 '5\' 8"'$  (1.727 m)    Weight:  Wt Readings from Last 1 Encounters:  03/13/15 155 lb 3.3 oz (70.4 kg)    Ideal Body Weight:  70 kg (kg)  Wt Readings from  Last 10 Encounters:  03/13/15 155 lb 3.3 oz (70.4 kg)  02/19/15 164 lb (74.39 kg)  01/22/15 164 lb 12.8 oz (74.753 kg)  01/16/15 163 lb 3.2 oz (74.027 kg)  01/05/15 163 lb 9.3 oz (74.2 kg)  12/27/14 170 lb 10.2 oz (77.4 kg)  12/16/14 176 lb 3.2 oz (79.924 kg)  11/24/14 177 lb 4.8 oz (80.423 kg)  09/25/14 161 lb 12.8 oz (73.392 kg)  09/01/14 168 lb (76.204 kg)    BMI:  Body mass index is 23.6 kg/(m^2).  Estimated Nutritional Needs:  Kcal:  2100-2300  Protein:  80-90 grams  Fluid:  2.2 L/day  Skin:  Reviewed, no issues  Diet Order:  DIET - DYS 1 Room service appropriate?: Yes; Fluid consistency:: Nectar Thick  EDUCATION NEEDS:  No education needs identified at this time   Intake/Output Summary (Last 24 hours) at 03/13/15 1302 Last data filed at 03/13/15 0650  Gross per 24 hour  Intake 1148.33 ml  Output    300 ml  Net 848.33 ml    Last BM:  PTA    Jarome Matin, RD, LDN Inpatient Clinical Dietitian Pager # [phone] After hours/weekend pager # [phone]   ADDENDUM: Last chemo was in 12/2014 and last radiation was in 01/2015.

## 2015-03-13 NOTE — Evaluation (Signed)
Clinical/Bedside Swallow Evaluation Patient Details  Name: Stephen Ellis MRN: [id number] Date of Birth: 1946/05/14  Today's Date: 03/13/2015 Time: SLP Start Time (ACUTE ONLY): 1478 SLP Stop Time (ACUTE ONLY): 0943 SLP Time Calculation (min) (ACUTE ONLY): 18 min  Past Medical History:  Past Medical History  Diagnosis Date  . Syncope   . Dehydration   . Blind left eye   . Hypertension   . Anemia     "low Blood"  . Dementia     poor memory  . Cancer     Lung  . Lung cancer 08/12/14    Right lung   Past Surgical History:  Past Surgical History  Procedure Laterality Date  . Video bronchoscopy Bilateral 07/29/2014    Procedure: VIDEO BRONCHOSCOPY WITHOUT FLUORO;  Surgeon: Tanda Rockers, MD;  Location: [facility] ENDOSCOPY;  Service: Cardiopulmonary;  Laterality: Bilateral;  . Video bronchoscopy Bilateral 08/12/2014    Procedure: VIDEO BRONCHOSCOPY WITHOUT FLUORO;  Surgeon: Wilhelmina Mcardle, MD;  Location: [facility] ENDOSCOPY;  Service: Cardiopulmonary;  Laterality: Bilateral;   HPI:  Patient is a 69 year old man with history of lung cancer status post chemotherapy in April and radiation that finished in May with a history of postobstructive pneumonia and difficulty swallowing who presents to the hospital with c/o increased difficulty swallowing and lethargy. CXR shows residual or recurrent RLL PNA.   Assessment / Plan / Recommendation Clinical Impression  Pt has delayed reflexive coughing with even small sips of thin liquids. Given that swallow initiation occurs swiftly, question if this delayed response could be indicative of esophageal component including retrograde flow and or thin liquid residuals in the pharynx due to pressure imbalances. Per MD note, XRT has impacted esophageal function although no additional notes or imaging found to ellaborate on this further.   No overt signs of aspiration are observed with pureed solids and nectar thick liquids, although after several trials of each pt began  to have increasingly prolonged oral holding. Eventually, pt began orally expectorating boluses, stating that he "can't swallow". Again, question if this could be related to esophageal component with build up of positive pressure resulting in increased difficulty swallowing.   For now, would initiate a pureed diet and nectar thick liquids to facilitate oropharyngeal and esophageal clearance, utilizing aspiration and esophageal precautions. Would allow small amounts of PO at a time. MD may wish to consider additional esophageal w/u - SLP to follow for diet tolerance and to determine if MBS is indicated.    Aspiration Risk  Moderate    Diet Recommendation Dysphagia 1 (Puree);Nectar   Medication Administration: Crushed with puree Compensations: Slow rate;Small sips/bites;Follow solids with liquid    Other  Recommendations Recommended Consults: Consider esophageal assessment Oral Care Recommendations: Oral care BID Other Recommendations: Order thickener from pharmacy;Prohibited food (jello, ice cream, thin soups);Remove water pitcher   Follow Up Recommendations       Frequency and Duration min 2x/week  2 weeks   Pertinent Vitals/Pain n/a    SLP Swallow Goals     Swallow Study Prior Functional Status       General Other Pertinent Information: Patient is a 69 year old man with history of lung cancer status post chemotherapy in April and radiation that finished in May with a history of postobstructive pneumonia and difficulty swallowing who presents to the hospital with c/o increased difficulty swallowing and lethargy. CXR shows residual or recurrent RLL PNA. Type of Study: Bedside swallow evaluation Previous Swallow Assessment: none in chart Diet Prior to this Study: NPO  Temperature Spikes Noted: Yes (100) Respiratory Status: Room air History of Recent Intubation: No Behavior/Cognition: Alert;Cooperative;Requires cueing Oral Cavity - Dentition: Missing dentition Self-Feeding  Abilities: Able to feed self;Needs assist Patient Positioning: Upright in bed Baseline Vocal Quality: Normal    Oral/Motor/Sensory Function Overall Oral Motor/Sensory Function: Appears within functional limits for tasks assessed (during functional tasks)   Ice Chips Ice chips: Not tested   Thin Liquid Thin Liquid: Impaired Presentation: Cup;Self Fed;Straw Oral Phase Functional Implications: Oral holding Pharyngeal  Phase Impairments: Cough - Delayed    Nectar Thick Nectar Thick Liquid: Impaired Presentation: Cup;Self Fed Oral phase functional implications: Oral holding   Honey Thick Honey Thick Liquid: Not tested   Puree Puree: Impaired Presentation: Spoon Oral Phase Functional Implications: Oral holding   Solid   Solid: Not tested      [name], M.A. CCC-SLP [phone]  [name] 03/13/2015,9:57 AM

## 2015-03-13 NOTE — Progress Notes (Signed)
Patient just arrived from ED.  Son left before able to help answer questions.  Patient only mumbling at this time and unable to answer admission history questions.  Will complete admission history when son visits tomorrow.Stephen Ellis

## 2015-03-14 DIAGNOSIS — E440 Moderate protein-calorie malnutrition: Secondary | ICD-10-CM | POA: Insufficient documentation

## 2015-03-14 LAB — BASIC METABOLIC PANEL WITH GFR
Anion gap: 9 (ref 5–15)
BUN: 14 mg/dL (ref 6–20)
CO2: 23 mmol/L (ref 22–32)
Calcium: 9.1 mg/dL (ref 8.9–10.3)
Chloride: 104 mmol/L (ref 101–111)
Creatinine, Ser: 1.34 mg/dL — ABNORMAL HIGH (ref 0.61–1.24)
GFR calc Af Amer: >60 mL/min
GFR calc non Af Amer: 53 mL/min — ABNORMAL LOW
Glucose, Bld: 107 mg/dL — ABNORMAL HIGH (ref 65–99)
Potassium: 3.5 mmol/L (ref 3.5–5.1)
Sodium: 136 mmol/L (ref 135–145)

## 2015-03-14 LAB — URINE CULTURE: Culture: NO GROWTH

## 2015-03-14 LAB — CBC
HEMATOCRIT: 33.9 % — AB (ref 39.0–52.0)
HEMOGLOBIN: 11 g/dL — AB (ref 13.0–17.0)
MCH: 31.3 pg (ref 26.0–34.0)
MCHC: 32.4 g/dL (ref 30.0–36.0)
MCV: 96.6 fL (ref 78.0–100.0)
Platelets: 137 10*3/uL — ABNORMAL LOW (ref 150–400)
RBC: 3.51 MIL/uL — ABNORMAL LOW (ref 4.22–5.81)
RDW: 12.7 % (ref 11.5–15.5)
WBC: 9.8 10*3/uL (ref 4.0–10.5)

## 2015-03-14 NOTE — Progress Notes (Signed)
Patient ID: Stephen Ellis, male   DOB: 02-29-1946, 69 y.o.   MRN: [id number]  [facility] PROGRESS NOTE  Stephen Ellis:[id number] DOB: Jul 19, 1946 DOA: 03/12/2015 PCP: Elizabeth Palau, MD   Brief narrative:    69 year old man with history of lung cancer status post chemotherapy in April and radiation that finished in May with a history of postobstructive pneumonia and difficulty swallowing who presented to [facility] ED for evaluation of progressive failure to thrive, poor oral intake, difficulty with swallowing. At the PCP office, pt was noted to have Tmax 100.4F, looked dehydrated on exam and was referred to the ED for an evaluation. CXR in ED revealed PNA, concern of aspiration PNA, pt started on Zosyn and admission requested.   Assessment/Plan:    Sepsis secondary to Aspiration pneumonia  - please note that pt has met criteria for sepsis on admission with HR up to 107 and RR up to 27 with suspected source of aspiration PNA, regardless of the fact he has been afebrile - pt is clinically responding to the treatment but remains with low grade fevers of 99.2 F - continue zosyn day #2 - follow up on sputum cultures, urine legionella and strep pneumo - please note that blood cultures were not collected at the time of the admission and no reason for this now as pt is improving and has been on ABX - continue dys I diet as recommended based on SLP evaluation, pt tolerating well so far   Bronchogenic cancer of the right lung - notified Dr. Mckinley Jewel via Epic of patient's admission. - pt is on a "holiday" from treatment at the moment.  Acute on chronic kidney disease stage III - It appears baseline creatinine is between 1.2 and 1.3. - Creatinine was 1.8 on admission, suspect related to prerenal azotemia given decreased oral intake. - pt placed on IVF and has responded well, Cr is trending down   Acute thrombocytopenia - possibly related to acute illness - monitor closely    Hypertension - norvasc has been on hod for now   DVT prophylaxis - Subcutaneous heparin.  Code Status: Full.  Family Communication:  plan of care discussed with the patient Disposition Plan: Home when stable.   IV access:  Peripheral IV  Procedures and diagnostic studies:    Dg Chest 2 View 03/12/2015   Residual or recurrent pneumonia involving the right lower lobe since the 01/02/2015 chest x-ray. 2. Stable central right upper lobe lung mass extending into the right hilum with postobstructive atelectasis involving the right upper lobe.     Medical Consultants:  None  Other Consultants:  None  IAnti-Infectives:   Zosyn 7/8 -->  Faye Ramsay, MD  [facility] Pager [phone]  If 7PM-7AM, please contact night-coverage [email or web address] Password [email or web address] 03/14/2015, 1:54 PM   LOS: 1 day   HPI/Subjective: No events overnight.   Objective: Filed Vitals:   03/13/15 2100 03/13/15 2118 03/14/15 0658 03/14/15 0836  BP: 128/95  123/91   Pulse: 89  88   Temp: 99.1 F (37.3 C)  99.2 F (37.3 C)   TempSrc: Oral  Oral   Resp: 20  20   Height:      Weight:      SpO2: 97% 93% 97% 95%    Intake/Output Summary (Last 24 hours) at 03/14/15 1354 Last data filed at 03/14/15 0900  Gross per 24 hour  Intake 2257.08 ml  Output   1250 ml  Net 1007.08 ml    Exam:   General:  Pt is alert, follows commands appropriately, not in acute distress  Cardiovascular: Regular rate and rhythm, S1/S2, no murmurs, no rubs, no gallops  Respiratory: Clear to auscultation bilaterally, no wheezing, rhonchi at the right base   Abdomen: Soft, non tender, non distended, bowel sounds present, no guarding   Data Reviewed: Basic Metabolic Panel:  Recent Labs Lab 03/12/15 1811 03/13/15 0355 03/14/15 0444  NA 137 138 136  K 4.0 3.7 3.5  CL 101 106 104  CO2 $Re'25 25 23  'Zsl$ GLUCOSE 120* 107* 107*  BUN 24* 21* 14  CREATININE 1.81* 1.66* 1.34*  CALCIUM 10.4* 9.4 9.1   Liver Function  Tests:  Recent Labs Lab 03/12/15 1811  AST 24  ALT 14*  ALKPHOS 63  BILITOT 0.8  PROT 11.0*  ALBUMIN 3.9   CBC:  Recent Labs Lab 03/12/15 1811 03/13/15 0355 03/14/15 0444  WBC 8.7 9.8 9.8  NEUTROABS 7.4  --   --   HGB 13.9 11.7* 11.0*  HCT 40.7 35.3* 33.9*  MCV 95.3 97.0 96.6  PLT 164 138* 137*    Recent Results (from the past 240 hour(s))  Culture, blood (routine x 2)     Status: None (Preliminary result)   Collection Time: 03/12/15  6:00 PM  Result Value Ref Range Status   Specimen Description BLOOD LEFT ANTECUBITAL  Final   Special Requests BOTTLES DRAWN AEROBIC AND ANAEROBIC 5CC EACH  Final   Culture   Final    NO GROWTH 2 DAYS Performed at [facility]    Report Status PENDING  Incomplete  Culture, blood (routine x 2)     Status: None (Preliminary result)   Collection Time: 03/12/15  6:30 PM  Result Value Ref Range Status   Specimen Description BLOOD RIGHT ANTECUBITAL  Final   Special Requests BOTTLES DRAWN AEROBIC AND ANAEROBIC 5ML  Final   Culture   Final    NO GROWTH 2 DAYS Performed at [facility]    Report Status PENDING  Incomplete  Urine culture     Status: None   Collection Time: 03/12/15  8:24 PM  Result Value Ref Range Status   Specimen Description URINE, CATHETERIZED  Final   Special Requests NONE  Final   Culture   Final    NO GROWTH 2 DAYS Performed at [facility]    Report Status 03/14/2015 FINAL  Final  MRSA PCR Screening     Status: None   Collection Time: 03/13/15  1:35 AM  Result Value Ref Range Status   MRSA by PCR NEGATIVE NEGATIVE Final    Comment:        The GeneXpert MRSA Assay (FDA approved for NASAL specimens only), is one component of a comprehensive MRSA colonization surveillance program. It is not intended to diagnose MRSA infection nor to guide or monitor treatment for MRSA infections.      Scheduled Meds: . antiseptic oral rinse  7 mL Mouth Rinse q12n4p  . budesonide-formoterol  2  puff Inhalation BID  . chlorhexidine  15 mL Mouth Rinse BID  . heparin  5,000 Units Subcutaneous 3 times per day  . multivitamin with minerals  1 tablet Oral Daily  . piperacillin-tazobactam (ZOSYN)  IV  3.375 g Intravenous 3 times per day   Continuous Infusions: . sodium chloride 75 mL/hr at 03/13/15 2140

## 2015-03-14 NOTE — Progress Notes (Signed)
Speech Language Pathology Treatment: Dysphagia  Patient Details Name: Stephen Ellis MRN: [id number] DOB: [birth year] Today's Date: 03/14/2015 Time: 9480-1655 SLP Time Calculation (min) (ACUTE ONLY): 15 min  Assessment / Plan / Recommendation Clinical Impression  The patient was seen for follow up ST to assess tolerance to the current diet.  The patient's intake has been very variable.  Today he refused to eat his lunch.  He continues to spike temps and his lungs continue to be poor.  The patient was given dysphagia 1 texture and he held the material in his mouth and eventually orally expectorated the material b/c he was afraid to swallow.  He was also given thin liquids without obvious s/s of aspiration via spoon and cup sips.  The patient reports most of his issues with foods rather then liquids.  Recommend continue with dysphagia 1 and upgrade to thin liquids.  Consider liquid supplements.  MD please consider esophageal work up to fully assess esophageal function.  ST will continue to follow and assess for need for MBS.     HPI Other Pertinent Information: Patient is a 68 year old man with history of lung cancer status post chemotherapy in April and radiation that finished in May with a history of postobstructive pneumonia and difficulty swallowing who presents to the hospital with c/o increased difficulty swallowing and lethargy. CXR shows residual or recurrent RLL PNA.   Pertinent Vitals Pain Assessment: No/denies pain  SLP Plan  Continue with current plan of care    Recommendations Diet recommendations: Dysphagia 1 (puree);Thin liquid Liquids provided via: Cup;No straw Medication Administration:  (crushed with ice cream) Supervision: Staff to assist with self feeding Compensations: Slow rate;Small sips/bites;Follow solids with liquid Postural Changes and/or Swallow Maneuvers: Seated upright 90 degrees;Upright 30-60 min after meal              Plan: Continue with current plan of care     GO     Lamar Sprinkles 03/14/2015, 3:21 PM  Shelly Flatten, [place], [facility] SLP [phone]

## 2015-03-14 NOTE — Evaluation (Signed)
Physical Therapy Evaluation Patient Details Name: Stephen Ellis MRN: [id number] DOB: [birth year] Today's Date: 03/14/2015   History of Present Illness  69 yo male adm 03/12/15 with difficulty  with speech, lethargy; PMHx: HTN, syncope, blind L eye  Clinical Impression  Pt admitted with above diagnosis. Pt currently with functional limitations due to the deficits listed below (see PT Problem List).  Pt will benefit from skilled PT to increase their independence and safety with mobility to allow discharge to the venue listed below.   Pt known to me from previous adm; He is certainly more dysarthric, ataxic and appears to be having word finding issues as well; Recommending SNF at this time, no family present at time of eval, will follow     Follow Up Recommendations SNF    Equipment Recommendations  None recommended by PT    Recommendations for Other Services       Precautions / Restrictions Precautions Precautions: Fall      Mobility  Bed Mobility Overal bed mobility: Needs Assistance Bed Mobility: Supine to Sit     Supine to sit: Mod assist;HOB elevated     General bed mobility comments: incr time, tactile cues to facilitate LE movement  Transfers Overall transfer level: Needs assistance   Transfers: Sit to/from Stand;Stand Pivot Transfers Sit to Stand: +2 physical assistance;Min assist;Mod assist;+2 safety/equipment Stand pivot transfers: Min assist;Mod assist;+2 physical assistance;+2 safety/equipment       General transfer comment: cues for safety, back up to surface  Ambulation/Gait                Stairs            Wheelchair Mobility    Modified Rankin (Stroke Patients Only)       Balance Overall balance assessment: Needs assistance   Sitting balance-Leahy Scale: Fair       Standing balance-Leahy Scale: Poor                               Pertinent Vitals/Pain Pain Assessment: No/denies pain    Home Living  Family/patient expects to be discharged to:: Skilled nursing facility Living Arrangements: Other (Comment)               Additional Comments: pt extremely dysarthric; pt familiar to me from previous adm and his dysarthria is much worse at this time, he seems to be having word finding difficulty as well;     Prior Function                 Hand Dominance        Extremity/Trunk Assessment   Upper Extremity Assessment: Defer to OT evaluation           Lower Extremity Assessment: Generalized weakness;RLE deficits/detail;LLE deficits/detail RLE Deficits / Details: ataxic moevement LLE Deficits / Details: ataxic movement     Communication   Communication: No difficulties  Cognition Arousal/Alertness: Awake/alert Behavior During Therapy: WFL for tasks assessed/performed Overall Cognitive Status: Difficult to assess Area of Impairment: Following commands;Problem solving       Following Commands: Follows one step commands with increased time     Problem Solving: Difficulty sequencing;Requires verbal cues;Requires tactile cues General Comments: limited verbal communication    General Comments      Exercises        Assessment/Plan    PT Assessment Patient needs continued PT services  PT Diagnosis Difficulty walking   PT Problem List Decreased  strength;Decreased activity tolerance;Decreased mobility;Decreased coordination;Decreased balance  PT Treatment Interventions DME instruction;Gait training;Functional mobility training;Therapeutic activities;Therapeutic exercise;Patient/family education   PT Goals (Current goals can be found in the Care Plan section) Acute Rehab PT Goals Patient Stated Goal: unable to state PT Goal Formulation: Patient unable to participate in goal setting Time For Goal Achievement: 03/28/15 Potential to Achieve Goals: Good    Frequency Min 3X/week   Barriers to discharge        Co-evaluation               End of  Session   Activity Tolerance: Patient tolerated treatment well Patient left: with call bell/phone within reach           Time: 1150-1211 PT Time Calculation (min) (ACUTE ONLY): 21 min   Charges:   PT Evaluation $Initial PT Evaluation Tier I: 1 Procedure     PT G CodesKenyon Ana 03/15/2015, 12:55 PM

## 2015-03-15 ENCOUNTER — Inpatient Hospital Stay (HOSPITAL_COMMUNITY): Payer: Medicare HMO

## 2015-03-15 LAB — CBC
HCT: 33.2 % — ABNORMAL LOW (ref 39.0–52.0)
Hemoglobin: 11 g/dL — ABNORMAL LOW (ref 13.0–17.0)
MCH: 31.8 pg (ref 26.0–34.0)
MCHC: 33.1 g/dL (ref 30.0–36.0)
MCV: 96 fL (ref 78.0–100.0)
Platelets: 130 10*3/uL — ABNORMAL LOW (ref 150–400)
RBC: 3.46 MIL/uL — AB (ref 4.22–5.81)
RDW: 12.5 % (ref 11.5–15.5)
WBC: 9.5 10*3/uL (ref 4.0–10.5)

## 2015-03-15 LAB — BASIC METABOLIC PANEL
ANION GAP: 10 (ref 5–15)
BUN: 12 mg/dL (ref 6–20)
CO2: 23 mmol/L (ref 22–32)
Calcium: 9.1 mg/dL (ref 8.9–10.3)
Chloride: 102 mmol/L (ref 101–111)
Creatinine, Ser: 1.25 mg/dL — ABNORMAL HIGH (ref 0.61–1.24)
GFR calc Af Amer: >60 mL/min (ref >60)
GFR calc non Af Amer: 58 mL/min — ABNORMAL LOW (ref >60)
Glucose, Bld: 105 mg/dL — ABNORMAL HIGH (ref 65–99)
POTASSIUM: 3.3 mmol/L — AB (ref 3.5–5.1)
Sodium: 135 mmol/L (ref 135–145)

## 2015-03-15 MED ORDER — SACCHAROMYCES BOULARDII 250 MG PO CAPS
250.0000 mg | ORAL_CAPSULE | Freq: Two times a day (BID) | ORAL | Status: DC
  Administered 2015-03-15 – 2015-03-17 (×4): 250 mg via ORAL
  Filled 2015-03-15 (×5): qty 1

## 2015-03-15 MED ORDER — POTASSIUM CHLORIDE CRYS ER 20 MEQ PO TBCR
40.0000 meq | EXTENDED_RELEASE_TABLET | Freq: Once | ORAL | Status: AC
  Administered 2015-03-15: 40 meq via ORAL
  Filled 2015-03-15: qty 2

## 2015-03-15 NOTE — Progress Notes (Signed)
Pt with axillary temp of 101.0 this morning with respiratory rate 32. No wheezing and denies SOB, O2 sat 98% on RA. Tylenol '650mg'$  given at 0515 and will follow up temp after an hour. Paged on call NP to report symptoms and received order for CXR. Will continue to monitor. Hortencia Conradi RN

## 2015-03-15 NOTE — Clinical Social Work Note (Signed)
Clinical Social Work Assessment  Patient Details  Name: Stephen Ellis MRN: [id number] Date of Birth: 1945-11-12  Date of referral:  03/15/15               Reason for consult:  Facility Placement                Permission sought to share information with:  Facility Sport and exercise psychologist, Family Supports Permission granted to share information::  Yes, Verbal Permission Granted  Name::        Agency::     Relationship::     Contact Information:     Housing/Transportation Living arrangements for the past 2 months:  Single Family Home Source of Information:  Adult Children Patient Interpreter Needed:   (Pt cannot speak/ vocal cords burnt with radiation) Criminal Activity/Legal Involvement Pertinent to Current Situation/Hospitalization:    Significant Relationships:  Adult Children Lives with:  Adult Children Do you feel safe going back to the place where you live?    Need for family participation in patient care:  Yes (Comment)  Care giving concerns:  Pt's son does not want pt to go to [facility]   Social Worker assessment / plan:  CSW met with pt and his son at bedside.  CSW explained role and prompted pt's son to discuss pt needs and history.  CSW explained SNF protocol and encouraged pt's son to discuss any concerns, questions and thoughts related to pt's health and discharge needs.  CSW will send pt information to SNF's in [employer] area.  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  Pt's son stated it is not permanent.  Pt's son stated that pt has been to both [facility] and [name] past year and had bad experience at [facility].  Pt's son cannot handle pt at home so he is grateful for rehab stating that he improved when [facility] this [facility].  Patient/Family's Understanding of and Emotional Response to Diagnosis, Current Treatment, and Prognosis:  Pt's son appears to be involved and knows pt's health history and current needs.  Pt appeared frustrated when he could not speak to CSW but more calm with son's input.  Emotional Assessment Appearance:  Appears stated age Attitude/Demeanor/Rapport:  Apprehensive Affect (typically observed):  Apprehensive Orientation:  Oriented to Self, Oriented to Place, Oriented to Situation Alcohol / Substance use:    Psych involvement (Current and /or in the community):     Discharge Needs  Concerns to be addressed:    Readmission within the last 30 days:    Current discharge risk:    Barriers to Discharge:  No Barriers Identified   Stephen Jews, LCSW 03/15/2015, 5:03 PM

## 2015-03-15 NOTE — Progress Notes (Signed)
Patient ID: Stephen Ellis, male   DOB: 05-25-1946, 69 y.o.   MRN: [id number]  [facility] PROGRESS NOTE  Stephen Ellis OVF:[id number] DOB: 1945-11-06 DOA: 03/12/2015 PCP: Elizabeth Palau, MD   Brief narrative:    69 year old man with history of lung cancer status post chemotherapy in April and radiation that finished in May with a history of postobstructive pneumonia and difficulty swallowing who presented to [facility] ED for evaluation of progressive failure to thrive, poor oral intake, difficulty with swallowing. At the PCP office, pt was noted to have Tmax 100.26F, looked dehydrated on exam and was referred to the ED for an evaluation. CXR in ED revealed PNA, concern of aspiration PNA, pt started on Zosyn and admission requested.   Assessment/Plan:    Sepsis secondary to Aspiration pneumonia  - please note that pt has met criteria for sepsis on admission with HR up to 107 and RR up to 27 with suspected source of aspiration PNA, regardless of the fact he has been afebrile - pt is clinically responding to the treatment but remains with low grade fevers of 100F - continue zosyn day #3 - urine legionella and strep pneumo negative  - continue dys I diet as recommended based on SLP evaluation, pt tolerating well so far   Bronchogenic cancer of the right lung - notified Dr. Mckinley Jewel via Epic of patient's admission. - pt is on a "holiday" from treatment at the moment.  Acute on chronic kidney disease stage III - It appears baseline creatinine is between 1.2 and 1.3. - Creatinine was 1.8 on admission, suspect related to prerenal azotemia given decreased oral intake. - pt placed on IVF and has responded well, Cr is trending down   Hypokalemia - supplement and repeat BMP in AM  Acute thrombocytopenia - possibly related to acute illness - monitor closely   Hypertension - norvasc has been on hod for now  - BP reasonable stable while inpatient   DVT prophylaxis - Subcutaneous  heparin.  Code Status: Full.  Family Communication:  plan of care discussed with the patient Disposition Plan: Home when stable.   IV access:  Peripheral IV  Procedures and diagnostic studies:    Dg Chest 2 View 03/12/2015   Residual or recurrent pneumonia involving the right lower lobe since the 01/02/2015 chest x-ray. 2. Stable central right upper lobe lung mass extending into the right hilum with postobstructive atelectasis involving the right upper lobe.     Medical Consultants:  None  Other Consultants:  None  IAnti-Infectives:   Zosyn 7/8 -->  Faye Ramsay, MD  [facility] Pager [phone]  If 7PM-7AM, please contact night-coverage [email or web address] Password [email or web address] 03/15/2015, 1:42 PM   LOS: 2 days   HPI/Subjective: No events overnight.   Objective: Filed Vitals:   03/14/15 2038 03/15/15 0515 03/15/15 0622 03/15/15 1043  BP: 139/93 137/85    Pulse: 102 99    Temp: 100 F (37.8 C) 101 F (38.3 C) 98.5 F (36.9 C)   TempSrc: Oral Axillary Axillary   Resp: 20 18    Height:      Weight:      SpO2: 100% 98%  95%    Intake/Output Summary (Last 24 hours) at 03/15/15 1342 Last data filed at 03/15/15 1336  Gross per 24 hour  Intake    120 ml  Output   1000 ml  Net   -880 ml    Exam:   General:  Pt is alert, follows commands appropriately, not in acute distress,  non verbal   Cardiovascular: Regular rate and rhythm, S1/S2, no murmurs, no rubs, no gallops  Respiratory: Clear to auscultation bilaterally, no wheezing, rhonchi at the right base still present but overall better   Abdomen: Soft, non tender, non distended, bowel sounds present, no guarding   Data Reviewed: Basic Metabolic Panel:  Recent Labs Lab 03/12/15 1811 03/13/15 0355 03/14/15 0444 03/15/15 0431  NA 137 138 136 135  K 4.0 3.7 3.5 3.3*  CL 101 106 104 102  CO2 $Re'25 25 23 23  'BFH$ GLUCOSE 120* 107* 107* 105*  BUN 24* 21* 14 12  CREATININE 1.81* 1.66* 1.34* 1.25*  CALCIUM 10.4* 9.4 9.1 9.1    Liver Function Tests:  Recent Labs Lab 03/12/15 1811  AST 24  ALT 14*  ALKPHOS 63  BILITOT 0.8  PROT 11.0*  ALBUMIN 3.9   CBC:  Recent Labs Lab 03/12/15 1811 03/13/15 0355 03/14/15 0444 03/15/15 0431  WBC 8.7 9.8 9.8 9.5  NEUTROABS 7.4  --   --   --   HGB 13.9 11.7* 11.0* 11.0*  HCT 40.7 35.3* 33.9* 33.2*  MCV 95.3 97.0 96.6 96.0  PLT 164 138* 137* 130*    Recent Results (from the past 240 hour(s))  Culture, blood (routine x 2)     Status: None (Preliminary result)   Collection Time: 03/12/15  6:00 PM  Result Value Ref Range Status   Specimen Description BLOOD LEFT ANTECUBITAL  Final   Special Requests BOTTLES DRAWN AEROBIC AND ANAEROBIC 5CC EACH  Final   Culture   Final    NO GROWTH 2 DAYS Performed at [facility]    Report Status PENDING  Incomplete  Culture, blood (routine x 2)     Status: None (Preliminary result)   Collection Time: 03/12/15  6:30 PM  Result Value Ref Range Status   Specimen Description BLOOD RIGHT ANTECUBITAL  Final   Special Requests BOTTLES DRAWN AEROBIC AND ANAEROBIC 5ML  Final   Culture   Final    NO GROWTH 2 DAYS Performed at [facility]    Report Status PENDING  Incomplete  Urine culture     Status: None   Collection Time: 03/12/15  8:24 PM  Result Value Ref Range Status   Specimen Description URINE, CATHETERIZED  Final   Special Requests NONE  Final   Culture   Final    NO GROWTH 2 DAYS Performed at [facility]    Report Status 03/14/2015 FINAL  Final  MRSA PCR Screening     Status: None   Collection Time: 03/13/15  1:35 AM  Result Value Ref Range Status   MRSA by PCR NEGATIVE NEGATIVE Final    Comment:        The GeneXpert MRSA Assay (FDA approved for NASAL specimens only), is one component of a comprehensive MRSA colonization surveillance program. It is not intended to diagnose MRSA infection nor to guide or monitor treatment for MRSA infections.      Scheduled Meds: . antiseptic  oral rinse  7 mL Mouth Rinse q12n4p  . budesonide-formoterol  2 puff Inhalation BID  . chlorhexidine  15 mL Mouth Rinse BID  . heparin  5,000 Units Subcutaneous 3 times per day  . multivitamin with minerals  1 tablet Oral Daily  . piperacillin-tazobactam (ZOSYN)  IV  3.375 g Intravenous 3 times per day  . potassium chloride  40 mEq Oral Once   Continuous Infusions: . sodium chloride 30 mL/hr at 03/14/15 1635

## 2015-03-16 ENCOUNTER — Inpatient Hospital Stay (HOSPITAL_COMMUNITY): Payer: Medicare HMO

## 2015-03-16 LAB — URINALYSIS, ROUTINE W REFLEX MICROSCOPIC
Bilirubin Urine: NEGATIVE
Glucose, UA: NEGATIVE mg/dL
Hgb urine dipstick: NEGATIVE
Ketones, ur: NEGATIVE mg/dL
Leukocytes, UA: NEGATIVE
Nitrite: NEGATIVE
PH: 6 (ref 5.0–8.0)
Protein, ur: 100 mg/dL — AB
SPECIFIC GRAVITY, URINE: 1.03 (ref 1.005–1.030)
Urobilinogen, UA: 1 mg/dL (ref 0.0–1.0)

## 2015-03-16 LAB — LACTIC ACID, PLASMA
LACTIC ACID, VENOUS: 0.9 mmol/L (ref 0.5–2.0)
Lactic Acid, Venous: 1.1 mmol/L (ref 0.5–2.0)

## 2015-03-16 LAB — URINE MICROSCOPIC-ADD ON

## 2015-03-16 NOTE — Progress Notes (Signed)
Speech Language Pathology Treatment: Dysphagia  Patient Details Name: Stephen Ellis MRN: [id number] DOB: 01/12/1946 Today's Date: 03/16/2015 Time: 1200-1229 SLP Time Calculation (min) (ACUTE ONLY): 29 min  Assessment / Plan / Recommendation Clinical Impression  Pt inconsistent historian from chart review.  Currently he reports difficulties with drink more than liquids.  Pt reports liquids lodge in his esophagus but are not refluxed *per direct ? Cue.  Note her informed prior SLP that he has more problems with foods than drinks.   Oral coordination/manipulation difficulties noted resulting in significantly delayed transiting.  Pt requested assist to eat foods but held his own cup for liquid consumption.  Per my request, pt did inform SLP when ready for next bolus, which he completed with min assistance.  No s/s of aspiration noted with intake pt accepted.  Suspect pt's oral and possible esophageal deficits are main source of aspiration risk.  Strategies in place to mitigate risk.   Pt consumed only a few boluses of milk and potatoes/meat before stating his was done- he denies displeasure with food but admitted to lack of appetite. Left Magic cup, icecream, tea and milk for pt consumption later.    Educated pt  to importance of controlling rate of caregiver po presentation to decrease aspiration risk.   SLP advised  pt to significantly increased aspiration risk with reliance on others for feeding exacerbated by his delay.  He nodded his head in understanding.  Modified sign posted in pt's room to delay in swallow and informed RN to findings of today's session.      HPI Other Pertinent Information: Patient is a 69 year old man with history of lung cancer status post chemotherapy in April and radiation that finished in May with a history of postobstructive pneumonia and difficulty swallowing who presents to the hospital with c/o increased difficulty swallowing and lethargy. CXR shows residual or  recurrent RLL PNA.   Pertinent Vitals Pain Assessment: No/denies pain  SLP Plan  Continue with current plan of care    Recommendations Diet recommendations: Dysphagia 1 (puree);Thin liquid Liquids provided via: Cup;No straw (no straws per pt) Medication Administration: Crushed with puree (with icecream) Supervision: Staff to assist with self feeding;Full supervision/cueing for compensatory strategies Compensations: Slow rate;Small sips/bites;Follow solids with liquid (assure pt swallows before providing more po) Postural Changes and/or Swallow Maneuvers: Seated upright 90 degrees;Upright 30-60 min after meal              Oral Care Recommendations: Oral care BID Follow up Recommendations: Other (comment) (TBD) Plan: Continue with current plan of care    [place], [place] [facility] SLP [phone]

## 2015-03-16 NOTE — Progress Notes (Addendum)
CSW continuing to follow.   CSW followed up with pt nephew, [name] regarding disposition planning. CSW was informed of pt son interest in [facility] and [place]. CSW spoke to [place] regarding pt family interest and facility is not in contract with pt insurance. CSW had to initiate SNF search this AM and CSW discussed that SNF offers currently available. Pt nephew stated that he needed an hour or two to research facilities in order to make decision for pt.   Pt nephew agreeable to CSW following up in an hour or two for decision. CSW discussed with pt nephew that decision would need to be made in order to get insurance authorization initiated. Pt nephew expressed understanding.   CSW to continue to follow.  Addendum 12:00 pm:  CSW followed up with pt nephew, [name] via telephone regarding decision for SNF. Pt nephew chooses [facility].   CSW contacted [facility] and notified facility of acceptance of bed offer. [facility] is Occupational hygienist authorization with [employer]. CSW awaiting update from [facility] regarding insurance authorization.   CSW to continue to follow.   Alison Murray, MSW, [place] [facility] [phone]

## 2015-03-16 NOTE — Progress Notes (Addendum)
Patient ID: Stephen Ellis, male   DOB: Sep 05, 1946, 69 y.o.   MRN: [id number]  [facility] PROGRESS NOTE  Stephen Ellis CWC:[id number] DOB: [birth year] DOA: 03/12/2015 PCP: Elizabeth Palau, MD   Brief narrative:    69 year old man with history of lung cancer status post chemotherapy in April and radiation that finished in May with a history of postobstructive pneumonia and difficulty swallowing who presented to [facility] ED for evaluation of progressive failure to thrive, poor oral intake, difficulty with swallowing. At the PCP office, pt was noted to have Tmax 100.57F, looked dehydrated on exam and was referred to the ED for an evaluation. CXR in ED revealed PNA, concern of aspiration PNA, pt started on Zosyn and admission requested.   Assessment/Plan:    Sepsis secondary to Aspiration pneumonia  - please note that pt has met criteria for sepsis on admission with HR up to 107 and RR up to 27 with suspected source of aspiration PNA, regardless of the fact he has been afebrile - pt is clinically responding to the treatment but remains febrile with Tmax 101.1F - continue zosyn day #4, check UA and urine culture, repeat CXR  - urine legionella and strep pneumo negative  - continue dys I diet as recommended based on SLP evaluation, pt tolerating well so far   Bronchogenic cancer of the right lung - notified Dr. Mckinley Jewel via Epic of patient's admission. - pt is on a "holiday" from treatment at the moment.  Acute on chronic kidney disease stage III - It appears baseline creatinine is between 1.2 and 1.3. - Creatinine was 1.8 on admission, suspect related to prerenal azotemia given decreased oral intake. - pt placed on IVF and has responded well, Cr is trending down  - will repeat BMP in AM  Hypokalemia - continue to supplement as it is still low   Acute thrombocytopenia - possibly related to acute illness - no signs of active bleeding  - monitor closely   Hypertension - norvasc has  been on hod for now  - BP reasonable stable while inpatient   Moderate malnutrition - appreciate nutritionist recommendations - continue supplements   DVT prophylaxis - Subcutaneous heparin.  Code Status: Full.  Family Communication:  plan of care discussed with the patient Disposition Plan: SNF in AM  IV access:  Peripheral IV  Procedures and diagnostic studies:    Dg Chest 2 View 03/12/2015   Residual or recurrent pneumonia involving the right lower lobe since the 01/02/2015 chest x-ray. 2. Stable central right upper lobe lung mass extending into the right hilum with postobstructive atelectasis involving the right upper lobe.     Medical Consultants:  None  Other Consultants:  None  IAnti-Infectives:   Zosyn 7/8 -->  Faye Ramsay, MD  [facility] Pager [phone]  If 7PM-7AM, please contact night-coverage [email or web address] Password [facility] 03/16/2015, 12:24 PM   LOS: 3 days   HPI/Subjective: No events overnight.   Objective: Filed Vitals:   03/15/15 2022 03/15/15 2156 03/16/15 0446 03/16/15 0848  BP:  133/88 127/80   Pulse:  94 86   Temp:  101.6 F (38.7 C) 99.2 F (37.3 C)   TempSrc:  Oral Oral   Resp:  18 24   Height:      Weight:      SpO2: 95% 98% 100% 93%    Intake/Output Summary (Last 24 hours) at 03/16/15 1224 Last data filed at 03/16/15 0500  Gross per 24 hour  Intake    480 ml  Output    675 ml  Net   -195 ml    Exam:   General:  Pt is alert, follows commands appropriately, not in acute distress, non verbal   Cardiovascular: Regular rate and rhythm, S1/S2, no murmurs, no rubs, no gallops  Respiratory: Clear to auscultation bilaterally, no wheezing, rhonchi at the right base still present but overall better   Abdomen: Soft, non tender, non distended, bowel sounds present, no guarding   Data Reviewed: Basic Metabolic Panel:  Recent Labs Lab 03/12/15 1811 03/13/15 0355 03/14/15 0444 03/15/15 0431  NA 137 138 136 135  K 4.0 3.7 3.5  3.3*  CL 101 106 104 102  CO2 $Re'25 25 23 23  'VYM$ GLUCOSE 120* 107* 107* 105*  BUN 24* 21* 14 12  CREATININE 1.81* 1.66* 1.34* 1.25*  CALCIUM 10.4* 9.4 9.1 9.1   Liver Function Tests:  Recent Labs Lab 03/12/15 1811  AST 24  ALT 14*  ALKPHOS 63  BILITOT 0.8  PROT 11.0*  ALBUMIN 3.9   CBC:  Recent Labs Lab 03/12/15 1811 03/13/15 0355 03/14/15 0444 03/15/15 0431  WBC 8.7 9.8 9.8 9.5  NEUTROABS 7.4  --   --   --   HGB 13.9 11.7* 11.0* 11.0*  HCT 40.7 35.3* 33.9* 33.2*  MCV 95.3 97.0 96.6 96.0  PLT 164 138* 137* 130*    Recent Results (from the past 240 hour(s))  Culture, blood (routine x 2)     Status: None (Preliminary result)   Collection Time: 03/12/15  6:00 PM  Result Value Ref Range Status   Specimen Description BLOOD LEFT ANTECUBITAL  Final   Special Requests BOTTLES DRAWN AEROBIC AND ANAEROBIC 5CC EACH  Final   Culture   Final    NO GROWTH 3 DAYS Performed at [facility]    Report Status PENDING  Incomplete  Culture, blood (routine x 2)     Status: None (Preliminary result)   Collection Time: 03/12/15  6:30 PM  Result Value Ref Range Status   Specimen Description BLOOD RIGHT ANTECUBITAL  Final   Special Requests BOTTLES DRAWN AEROBIC AND ANAEROBIC 5ML  Final   Culture   Final    NO GROWTH 3 DAYS Performed at [facility]    Report Status PENDING  Incomplete  Urine culture     Status: None   Collection Time: 03/12/15  8:24 PM  Result Value Ref Range Status   Specimen Description URINE, CATHETERIZED  Final   Special Requests NONE  Final   Culture   Final    NO GROWTH 2 DAYS Performed at [facility]    Report Status 03/14/2015 FINAL  Final  MRSA PCR Screening     Status: None   Collection Time: 03/13/15  1:35 AM  Result Value Ref Range Status   MRSA by PCR NEGATIVE NEGATIVE Final    Comment:        The GeneXpert MRSA Assay (FDA approved for NASAL specimens only), is one component of a comprehensive MRSA  colonization surveillance program. It is not intended to diagnose MRSA infection nor to guide or monitor treatment for MRSA infections.      Scheduled Meds: . antiseptic oral rinse  7 mL Mouth Rinse q12n4p  . budesonide-formoterol  2 puff Inhalation BID  . chlorhexidine  15 mL Mouth Rinse BID  . heparin  5,000 Units Subcutaneous 3 times per day  . multivitamin with minerals  1 tablet Oral Daily  . piperacillin-tazobactam (ZOSYN)  IV  3.375 g Intravenous 3 times per day  . saccharomyces boulardii  250 mg Oral BID   Continuous Infusions:

## 2015-03-16 NOTE — Progress Notes (Signed)
Patient had a temp of 101.6 this evening. Hospitalist paged and made aware. Patient was given tylenol for temperature.

## 2015-03-16 NOTE — Progress Notes (Signed)
ANTIBIOTIC CONSULT NOTE - follow up  Pharmacy Consult for zosyn Indication: asp. PNA  No Known Allergies  Patient Measurements: Height: '5\' 8"'$  (172.7 cm) (manually measure him) Weight: 155 lb 3.3 oz (70.4 kg) IBW/kg (Calculated) : 68.4 Adjusted Body Weight:   Vital Signs: Temp: 99.2 F (37.3 C) (07/11 0446) Temp Source: Oral (07/11 0446) BP: 127/80 mmHg (07/11 0446) Pulse Rate: 86 (07/11 0446) Intake/Output from previous day: 07/10 0701 - 07/11 0700 In: 640 [P.O.:640] Out: 675 [Urine:675] Intake/Output from this shift:    Labs:  Recent Labs  03/14/15 0444 03/15/15 0431  WBC 9.8 9.5  HGB 11.0* 11.0*  PLT 137* 130*  CREATININE 1.34* 1.25*   Estimated Creatinine Clearance: 55.5 mL/min (by C-G formula based on Cr of 1.25). No results for input(s): VANCOTROUGH, VANCOPEAK, VANCORANDOM, GENTTROUGH, GENTPEAK, GENTRANDOM, TOBRATROUGH, TOBRAPEAK, TOBRARND, AMIKACINPEAK, AMIKACINTROU, AMIKACIN in the last 72 hours.   Microbiology: Recent Results (from the past 720 hour(s))  Culture, blood (routine x 2)     Status: None (Preliminary result)   Collection Time: 03/12/15  6:00 PM  Result Value Ref Range Status   Specimen Description BLOOD LEFT ANTECUBITAL  Final   Special Requests BOTTLES DRAWN AEROBIC AND ANAEROBIC 5CC EACH  Final   Culture   Final    NO GROWTH 3 DAYS Performed at [facility]    Report Status PENDING  Incomplete  Culture, blood (routine x 2)     Status: None (Preliminary result)   Collection Time: 03/12/15  6:30 PM  Result Value Ref Range Status   Specimen Description BLOOD RIGHT ANTECUBITAL  Final   Special Requests BOTTLES DRAWN AEROBIC AND ANAEROBIC 5ML  Final   Culture   Final    NO GROWTH 3 DAYS Performed at [facility]    Report Status PENDING  Incomplete  Urine culture     Status: None   Collection Time: 03/12/15  8:24 PM  Result Value Ref Range Status   Specimen Description URINE, CATHETERIZED  Final   Special Requests NONE   Final   Culture   Final    NO GROWTH 2 DAYS Performed at [facility]    Report Status 03/14/2015 FINAL  Final  MRSA PCR Screening     Status: None   Collection Time: 03/13/15  1:35 AM  Result Value Ref Range Status   MRSA by PCR NEGATIVE NEGATIVE Final    Comment:        The GeneXpert MRSA Assay (FDA approved for NASAL specimens only), is one component of a comprehensive MRSA colonization surveillance program. It is not intended to diagnose MRSA infection nor to guide or monitor treatment for MRSA infections.     Medical History: Past Medical History  Diagnosis Date  . Syncope   . Dehydration   . Blind left eye   . Hypertension   . Anemia     "low Blood"  . Dementia     poor memory  . Cancer     Lung  . Lung cancer 08/12/14    Right lung    Medications:  Anti-infectives    Start     Dose/Rate Route Frequency Ordered Stop   03/13/15 0600  piperacillin-tazobactam (ZOSYN) IVPB 3.375 g     3.375 g 12.5 mL/hr over 240 Minutes Intravenous 3 times per day 03/13/15 0253     03/13/15 0015  piperacillin-tazobactam (ZOSYN) IVPB 3.375 g     3.375 g 100 mL/hr over 30 Minutes Intravenous  Once 03/13/15 0008  03/13/15 0044     Assessment: 69 year old man with history of lung cancer status post chemotherapy in April and radiation that finished in May with a history of postobstructive pneumonia and difficulty swallowing who presents to the hospital today with difficultly swallowing and lethargy and Zosyn started for aspiration PNA on 7/8. Patient continues to have fever with Tmax 101.6 PM 7/10. WBC and SCr stable. Cultures negative  Goal of Therapy:  Zosyn based on renal function   Plan:  1) Continue Zosyn 3.375g IV q8 (extended interval infusion) 2) Will sign off but continue to follow labs and cultures 3) Recommend change to Augmentin '875mg'$  BID when possible   Adrian Saran, PharmD, BCPS Pager [phone] 03/16/2015 8:19 AM

## 2015-03-16 NOTE — Clinical Social Work Placement (Signed)
   CLINICAL SOCIAL WORK PLACEMENT  NOTE  Date:  03/16/2015  Patient Details  Name: Stephen Ellis MRN: [id number] Date of Birth: [birth year]  Clinical Social Work is seeking post-discharge placement for this patient at the [place] level of care (*CSW will initial, date and re-position this form in  chart as items are completed):  Yes   Patient/family provided with [facility] Work Department's list of facilities offering this level of care within the geographic area requested by the patient (or if unable, by the patient's family).  Yes   Patient/family informed of their freedom to choose among providers that offer the needed level of care, that participate in Medicare, Medicaid or managed care program needed by the patient, have an available bed and are willing to accept the patient.  Yes   Patient/family informed of [place]'s ownership interest in [facility] and [facility], as well as of the fact that they are under no obligation to receive care at these facilities.  PASRR submitted to EDS on       PASRR number received on       Existing PASRR number confirmed on 03/16/15     FL2 transmitted to all facilities in geographic area requested by pt/family on 03/16/15     FL2 transmitted to all facilities within larger geographic area on       Patient informed that his/her managed care company has contracts with or will negotiate with certain facilities, including the following:        Yes   Patient/family informed of bed offers received.  Patient chooses bed at [facility]     Physician recommends and patient chooses bed at      Patient to be transferred to   on  .  Patient to be transferred to facility by       Patient family notified on   of transfer.  Name of family member notified:        PHYSICIAN Please sign FL2     Additional Comment:    _______________________________________________ Ladell Pier,  LCSW 03/16/2015, 12:16 PM

## 2015-03-16 NOTE — Care Management (Signed)
Important Message  Patient Details  Name: Stephen Ellis MRN: [id number] Date of Birth: 03/28/1946   Medicare Important Message Given:  Yes-second notification given    Camillo Flaming 03/16/2015, 12:17 PM

## 2015-03-17 LAB — BASIC METABOLIC PANEL
ANION GAP: 9 (ref 5–15)
BUN: 14 mg/dL (ref 6–20)
CALCIUM: 9.4 mg/dL (ref 8.9–10.3)
CO2: 25 mmol/L (ref 22–32)
Chloride: 100 mmol/L — ABNORMAL LOW (ref 101–111)
Creatinine, Ser: 1.23 mg/dL (ref 0.61–1.24)
GFR calc Af Amer: >60 mL/min (ref >60)
GFR calc non Af Amer: 59 mL/min — ABNORMAL LOW (ref >60)
Glucose, Bld: 100 mg/dL — ABNORMAL HIGH (ref 65–99)
Potassium: 3.4 mmol/L — ABNORMAL LOW (ref 3.5–5.1)
SODIUM: 134 mmol/L — AB (ref 135–145)

## 2015-03-17 LAB — CBC
HCT: 30.9 % — ABNORMAL LOW (ref 39.0–52.0)
Hemoglobin: 10.5 g/dL — ABNORMAL LOW (ref 13.0–17.0)
MCH: 31.7 pg (ref 26.0–34.0)
MCHC: 34 g/dL (ref 30.0–36.0)
MCV: 93.4 fL (ref 78.0–100.0)
PLATELETS: 152 10*3/uL (ref 150–400)
RBC: 3.31 MIL/uL — ABNORMAL LOW (ref 4.22–5.81)
RDW: 12.3 % (ref 11.5–15.5)
WBC: 7.8 10*3/uL (ref 4.0–10.5)

## 2015-03-17 LAB — CULTURE, BLOOD (ROUTINE X 2)
CULTURE: NO GROWTH
Culture: NO GROWTH

## 2015-03-17 MED ORDER — ALBUTEROL SULFATE (2.5 MG/3ML) 0.083% IN NEBU: 2.5000 mg | INHALATION_SOLUTION | RESPIRATORY_TRACT | Status: DC | PRN

## 2015-03-17 MED ORDER — POTASSIUM CHLORIDE CRYS ER 20 MEQ PO TBCR
40.0000 meq | EXTENDED_RELEASE_TABLET | Freq: Once | ORAL | Status: DC
  Filled 2015-03-17: qty 2

## 2015-03-17 MED ORDER — AMOXICILLIN-POT CLAVULANATE 500-125 MG PO TABS: 1.0000 | ORAL_TABLET | Freq: Three times a day (TID) | ORAL | Status: DC

## 2015-03-17 MED ORDER — OXYCODONE HCL 5 MG PO TABS: 5.0000 mg | ORAL_TABLET | ORAL | Status: DC | PRN

## 2015-03-17 MED ORDER — POTASSIUM CHLORIDE ER 20 MEQ PO TBCR
20.0000 meq | EXTENDED_RELEASE_TABLET | Freq: Every day | ORAL | Status: DC
Start: 2015-03-17 — End: 2015-04-06

## 2015-03-17 NOTE — Progress Notes (Signed)
Pt for discharge to [facility].   CSW facilitated pt discharge needs including contacting facility, faxing pt discharge information via [facility], discussing with pt at bedside and pt nephew, [name] via telephone, [place] confirmed with [facility] that [facility] insurance authorization received, providing RN phone number to call report, and arranging ambulance transport via [facility] for pt to [facility].   Pt nephew, [name] reports that pt brother plans to meet pt at facility upon pt arrival.   No further social work needs identified at this time.  CSW signing off.   Alison Murray, MSW, [place] Work [phone]

## 2015-03-17 NOTE — Discharge Instructions (Signed)
Aspiration Pneumonia °Aspiration pneumonia is an infection in your lungs. It occurs when food, liquid, or stomach contents (vomit) are inhaled (aspirated) into your lungs. When these things get into your lungs, swelling (inflammation) and infection can occur. This can make it difficult for you to breathe. Aspiration pneumonia is a serious condition and can be life threatening. °RISK FACTORS °Aspiration pneumonia is more likely to occur when a person's cough (gag) reflex or ability to swallow has been decreased. Some things that can do this include:  °· Having a brain injury or disease, such as stroke, seizures, Parkinson's disease, dementia, or amyotrophic lateral sclerosis (ALS).   °· Being given general anesthetic for procedures.   °· Being in a coma (unconscious).   °· Having a narrowing of the tube that carries food to the stomach (esophagus).   °· Drinking too much alcohol. If a person passes out and vomits, vomit can be swallowed into the lungs.   °· Taking certain medicines, such as tranquilizers or sedatives.   °SIGNS AND SYMPTOMS  °· Coughing after swallowing food or liquids.   °· Breathing problems, such as wheezing or shortness of breath.   °· Bluish skin. This can be caused by lack of oxygen.   °· Coughing up food or mucus. The mucus might contain blood, greenish material, or yellowish-white fluid (pus).   °· Fever.   °· Chest pain.   °· Being more tired than usual (fatigue).   °· Sweating more than usual.   °· Bad breath.   °DIAGNOSIS  °A physical exam will be done. During the exam, the health care provider will listen to your lungs with a stethoscope to check for:  °· Crackling sounds in the lungs. °· Decreased breath sounds. °· A rapid heartbeat. °Various tests may be ordered. These may include:  °· Chest X-ray.   °· CT scan.   °· Swallowing study. This test looks at how food is swallowed and whether it goes into your breathing tube (trachea) or food pipe (esophagus).   °· Sputum culture. Saliva and  mucus (sputum) are collected from the lungs or the tubes that carry air to the lungs (bronchi). The sputum is then tested for bacteria.   °· Bronchoscopy. This test uses a flexible tube (bronchoscope) to see inside the lungs. °TREATMENT  °Treatment will usually include antibiotic medicines. Other medicines may also be used to reduce fever or pain. You may need to be treated in the hospital. In the hospital, your breathing will be carefully monitored. Depending on how well you are breathing, you may need to be given oxygen, or you may need breathing support from a breathing machine (ventilator). For people who fail a swallowing study, a feeding tube might be placed in the stomach, or they may be asked to avoid certain food textures or liquids when they eat. °HOME CARE INSTRUCTIONS  °· Carefully follow any special eating instructions you were given, such as avoiding certain food textures or thickening liquids. This reduces the risk of developing aspiration pneumonia again.  °· Only take over-the-counter or prescription medicines as directed by your health care provider. Follow the directions carefully.   °· If you were prescribed antibiotics, take them as directed. Finish them even if you start to feel better.   °· Rest as instructed by your health care provider.   °· Keep all follow-up appointments with your health care provider.   °SEEK MEDICAL CARE IF:  °· You develop worsening shortness of breath, wheezing, or difficulty breathing.   °· You develop a fever.   °· You have chest pain.   °MAKE SURE YOU:  °· Understand these instructions. °· Will watch your   condition. °· Will get help right away if you are not doing well or get worse. °Document Released: 06/19/2009 Document Revised: 08/27/2013 Document Reviewed: 02/07/2013 °ExitCare® Patient Information ©2015 [employer]. This information is not intended to replace advice given to you by your health care provider. Make sure you discuss any questions you have with  your health care provider. ° °

## 2015-03-17 NOTE — Clinical Social Work Placement (Signed)
   CLINICAL SOCIAL WORK PLACEMENT  NOTE  Date:  03/17/2015  Patient Details  Name: Stephen Ellis MRN: [id number] Date of Birth: 1946-04-13  Clinical Social Work is seeking post-discharge placement for this patient at the [place] level of care (*CSW will initial, date and re-position this form in  chart as items are completed):  Yes   Patient/family provided with [place] Work Department's list of facilities offering this level of care within the geographic area requested by the patient (or if unable, by the patient's family).  Yes   Patient/family informed of their freedom to choose among providers that offer the needed level of care, that participate in Medicare, Medicaid or managed care program needed by the patient, have an available bed and are willing to accept the patient.  Yes   Patient/family informed of [facility]'s ownership interest in [facility] and [facility], as well as of the fact that they are under no obligation to receive care at these facilities.  PASRR submitted to EDS on       PASRR number received on       Existing PASRR number confirmed on 03/16/15     FL2 transmitted to all facilities in geographic area requested by pt/family on 03/16/15     FL2 transmitted to all facilities within larger geographic area on       Patient informed that his/her managed care company has contracts with or will negotiate with certain facilities, including the following:        Yes   Patient/family informed of bed offers received.  Patient chooses bed at [facility]     Physician recommends and patient chooses bed at      Patient to be transferred to [facility] on 03/17/15.  Patient to be transferred to facility by ambulance Corey Harold)     Patient family notified on 03/17/15 of transfer.  Name of family member notified:  pt notified at bedside and pt nephew, [name] notified via telephone     PHYSICIAN Please  sign FL2     Additional Comment:    _______________________________________________ Ladell Pier, LCSW 03/17/2015, 2:19 PM

## 2015-03-17 NOTE — Progress Notes (Signed)
Physical Therapy Treatment Patient Details Name: Stephen Ellis MRN: [id number] DOB: [birth year] Today's Date: 03/17/2015    History of Present Illness 69 yo male adm 03/12/15 with difficulty  with speech, lethargy; PMHx: HTN, syncope, blind L eye    PT Comments    Progressing with mobility. Able to take a few steps on today. Continue to recommend SNF for rehab.   Follow Up Recommendations  SNF     Equipment Recommendations  None recommended by PT    Recommendations for Other Services       Precautions / Restrictions Precautions Precautions: Fall Restrictions Weight Bearing Restrictions: No    Mobility  Bed Mobility Overal bed mobility: Needs Assistance Bed Mobility: Supine to Sit     Supine to sit: Mod assist;HOB elevated     General bed mobility comments: Assist for trunk and bil LEs. Increased time. Utilized bedpad for scooting, positioning. Multimodal cues for technique  Transfers Overall transfer level: Needs assistance Equipment used: Rolling walker (2 wheeled) Transfers: Sit to/from Stand Sit to Stand: From elevated surface;Min assist;+2 physical assistance;+2 safety/equipment         General transfer comment: Assist to rise, stabilize, control descent. Multimodal cues for safety, technique, hand placement  Ambulation/Gait Ambulation/Gait assistance: Mod assist;+2 physical assistance;+2 safety/equipment Ambulation Distance (Feet): 7 Feet (x2) Assistive device: Rolling walker (2 wheeled) Gait Pattern/deviations: Trunk flexed;Step-to pattern;Wide base of support;Shuffle     General Gait Details: Assist to stabilize pt, maneuver with RW, advance L LE intermittently. Multimodal cues for safety, technique, sequence, safe RW use. Fatigues quickly/easily. Chair brought up behind pt for seated rest break.    Stairs            Wheelchair Mobility    Modified Rankin (Stroke Patients Only)       Balance                                     Cognition Arousal/Alertness: Awake/alert   Overall Cognitive Status: Difficult to assess         Following Commands: Follows one step commands inconsistently     Problem Solving: Slow processing;Decreased initiation;Difficulty sequencing;Requires verbal cues;Requires tactile cues      Exercises      General Comments        Pertinent Vitals/Pain Pain Assessment: No/denies pain    Home Living                      Prior Function            PT Goals (current goals can now be found in the care plan section) Progress towards PT goals: Progressing toward goals    Frequency  Min 3X/week    PT Plan Current plan remains appropriate    Co-evaluation             End of Session Equipment Utilized During Treatment: Gait belt Activity Tolerance: Patient tolerated treatment well Patient left: in bed;with call bell/phone within reach;with bed alarm set     Time: 1131-1146 PT Time Calculation (min) (ACUTE ONLY): 15 min  Charges:  $Gait Training: 8-22 mins                    G Codes:      Weston Anna, MPT Pager: [phone]

## 2015-03-17 NOTE — Progress Notes (Signed)
RT instructed patient on use of flutter valve.  Patient has some difficulty holding and using the flutter valve but was able to do so with RT assistance. RT will continue to monitor.

## 2015-03-17 NOTE — Discharge Summary (Signed)
Physician Discharge Summary  Stephen Ellis:[id number] DOB: 27-Jul-1946 DOA: 03/12/2015  PCP: Elizabeth Palau, MD  Admit date: 03/12/2015 Discharge date: 03/17/2015  Recommendations for Outpatient Follow-up:  1. Pt will need to follow up with PCP in 1 weeks post discharge 2. Please obtain BMP to evaluate electrolytes and kidney function 3. Please also check CBC to evaluate Hg and Hct levels 4. Please ensure pulmonary hygiene and IS every two hours while awake 5. Pt to complete course of ABX Augmentin for Aspiration PNA    Discharge Diagnoses:  Principal Problem:   Aspiration pneumonia Active Problems:   Postobstructive pneumonia   Essential hypertension   Bronchogenic carcinoma of right lung   CKD (chronic kidney disease) stage 3, GFR 30-59 ml/min   ARF (acute renal failure)   Malnutrition of moderate degree  Discharge Condition: Stable  Diet recommendation: Dys I diet   Brief narrative:    69 year old man with history of lung cancer status post chemotherapy in April and radiation that finished in May with a history of postobstructive pneumonia and difficulty swallowing who presented to [facility] ED for evaluation of progressive failure to thrive, poor oral intake, difficulty with swallowing. At the PCP office, pt was noted to have Tmax 100.63F, looked dehydrated on exam and was referred to the ED for an evaluation. CXR in ED revealed PNA, concern of aspiration PNA, pt started on Zosyn and admission requested.   Assessment/Plan:    Sepsis secondary to Aspiration pneumonia  - please note that pt has met criteria for sepsis on admission with HR up to 107 and RR up to 27 with suspected source of aspiration PNA, regardless of the fact he has been afebrile - pt is better this AM, afebrile in the past 24 hours, normal WBC - urine legionella and strep pneumo negative  - continue dys I diet as recommended based on SLP evaluation, pt tolerating well so far  - continue Augmentin to  complete therapy   Bronchogenic cancer of the right lung - notified Dr. Mckinley Jewel via Epic of patient's admission. - pt is on a "holiday" from treatment at the moment.  Acute on chronic kidney disease stage III - It appears baseline creatinine is between 1.2 and 1.3. - Creatinine was 1.8 on admission, suspect related to prerenal azotemia given decreased oral intake. - pt placed on IVF and has responded well, Cr is trending down and is WNL this AM  Hypokalemia - continue to supplement and repeat BMP in one week to ensure K is stable and WNL  Acute thrombocytopenia - possibly related to acute illness - no signs of active bleeding   Hypertension - BP reasonably stable while inpatient   Moderate malnutrition - appreciate nutritionist recommendations - continue supplements    Code Status: Full.  Family Communication: plan of care discussed with the patient Disposition Plan: SNF   IV access:  Peripheral IV  Procedures and diagnostic studies:   Dg Chest 2 View 03/12/2015 Residual or recurrent pneumonia involving the right lower lobe since the 01/02/2015 chest x-ray. 2. Stable central right upper lobe lung mass extending into the right hilum with postobstructive atelectasis involving the right upper lobe.   Medical Consultants:  None  Other Consultants:  None  IAnti-Infectives:   Zosyn 7/8 --> changed to Augmentin upon discharge        Discharge Exam: Filed Vitals:   03/17/15 0502  BP: 121/88  Pulse: 86  Temp: 98.7 F (37.1 C)  Resp: 20   Filed Vitals:  03/16/15 1430 03/16/15 1950 03/16/15 2033 03/17/15 0502  BP: 122/82  130/91 121/88  Pulse: 82  92 86  Temp: 98.6 F (37 C)  98.5 F (36.9 C) 98.7 F (37.1 C)  TempSrc: Oral  Oral Oral  Resp: 22   20  Height:      Weight:      SpO2: 95% 94% 100% 97%    General: Pt is alert, follows commands appropriately, not in acute distress, speech minimal at baseline  Cardiovascular: Regular rate  and rhythm, S1/S2 +, no murmurs, no rubs, no gallops Respiratory: Clear to auscultation bilaterally, no wheezing, diminished breath sounds at bases  Abdominal: Soft, non tender, non distended, bowel sounds +, no guarding  Discharge Instructions     Medication List    TAKE these medications        acetaminophen 325 MG tablet  Commonly known as:  TYLENOL  Take 325-650 mg by mouth every 6 (six) hours as needed for moderate pain or fever.     albuterol (2.5 MG/3ML) 0.083% nebulizer solution  Commonly known as:  PROVENTIL  Take 3 mLs (2.5 mg total) by nebulization every 4 (four) hours as needed for wheezing or shortness of breath.     amLODipine 10 MG tablet  Commonly known as:  NORVASC  Take 1 tablet (10 mg total) by mouth daily.     amoxicillin-clavulanate 500-125 MG per tablet  Commonly known as:  AUGMENTIN  Take 1 tablet (500 mg total) by mouth 3 (three) times daily.     budesonide-formoterol 160-4.5 MCG/ACT inhaler  Commonly known as:  SYMBICORT  Inhale 2 puffs into the lungs 2 (two) times daily.     CERTAGEN PO  Take by mouth.     CENTROVITE Tabs  TAKE 1 TABLET BY MOUTH ONCE DAILY     guaiFENesin 100 MG/5ML liquid  Commonly known as:  ROBITUSSIN  Take 200 mg by mouth 3 (three) times daily as needed for cough.     ipratropium-albuterol 0.5-2.5 (3) MG/3ML Soln  Commonly known as:  DUONEB  Take 3 mLs by nebulization every 6 (six) hours.     oxyCODONE 5 MG immediate release tablet  Commonly known as:  Oxy IR/ROXICODONE  Take 1 tablet (5 mg total) by mouth every 4 (four) hours as needed for moderate pain.     Potassium Chloride ER 20 MEQ Tbcr  Take 20 mEq by mouth daily.     PROBIOTIC DAILY PO  Take 1 tablet by mouth daily.     prochlorperazine 10 MG tablet  Commonly known as:  COMPAZINE  TAKE 1 TABLET EVERY 6 HOURS AS NEEDED FOR NAUSEA OR VOMITING            Follow-up Information    Follow up with Elizabeth Palau, MD.   Specialty:  Family Medicine    Contact information:   [address] [place] [address] [phone]        The results of significant diagnostics from this hospitalization (including imaging, microbiology, ancillary and laboratory) are listed below for reference.     Microbiology: Recent Results (from the past 240 hour(s))  Culture, blood (routine x 2)     Status: None (Preliminary result)   Collection Time: 03/12/15  6:00 PM  Result Value Ref Range Status   Specimen Description BLOOD LEFT ANTECUBITAL  Final   Special Requests BOTTLES DRAWN AEROBIC AND ANAEROBIC 5CC EACH  Final   Culture   Final    NO GROWTH  4 DAYS Performed at [facility]    Report Status PENDING  Incomplete  Culture, blood (routine x 2)     Status: None (Preliminary result)   Collection Time: 03/12/15  6:30 PM  Result Value Ref Range Status   Specimen Description BLOOD RIGHT ANTECUBITAL  Final   Special Requests BOTTLES DRAWN AEROBIC AND ANAEROBIC 5ML  Final   Culture   Final    NO GROWTH 4 DAYS Performed at [facility]    Report Status PENDING  Incomplete  Urine culture     Status: None   Collection Time: 03/12/15  8:24 PM  Result Value Ref Range Status   Specimen Description URINE, CATHETERIZED  Final   Special Requests NONE  Final   Culture   Final    NO GROWTH 2 DAYS Performed at [facility]    Report Status 03/14/2015 FINAL  Final  MRSA PCR Screening     Status: None   Collection Time: 03/13/15  1:35 AM  Result Value Ref Range Status   MRSA by PCR NEGATIVE NEGATIVE Final    Comment:        The GeneXpert MRSA Assay (FDA approved for NASAL specimens only), is one component of a comprehensive MRSA colonization surveillance program. It is not intended to diagnose MRSA infection nor to guide or monitor treatment for MRSA infections.      Labs: Basic Metabolic Panel:  Recent Labs Lab 03/12/15 1811 03/13/15 0355 03/14/15 0444 03/15/15 0431 03/17/15 0405  NA 137 138  136 135 134*  K 4.0 3.7 3.5 3.3* 3.4*  CL 101 106 104 102 100*  CO2 _0 GLUCOSE 120* 107* 107* 105* 100*  BUN 24* 21* _1 CREATININE 1.81* 1.66* 1.34* 1.25* 1.23  CALCIUM 10.4* 9.4 9.1 9.1 9.4   Liver Function Tests:  Recent Labs Lab 03/12/15 1811  AST 24  ALT 14*  ALKPHOS 63  BILITOT 0.8  PROT 11.0*  ALBUMIN 3.9   CBC:  Recent Labs Lab 03/12/15 1811 03/13/15 0355 03/14/15 0444 03/15/15 0431 03/17/15 0405  WBC 8.7 9.8 9.8 9.5 7.8  NEUTROABS 7.4  --   --   --   --   HGB 13.9 11.7* 11.0* 11.0* 10.5*  HCT 40.7 35.3* 33.9* 33.2* 30.9*  MCV 95.3 97.0 96.6 96.0 93.4  PLT 164 138* 137* 130* 152    SIGNED: Time coordinating discharge: 30 minutes  MAGICK-Eris Hannan, MD  [facility] 03/17/2015, 6:46 AM Pager [phone]  If 7PM-7AM, please contact night-coverage [email or web address] Password [email or web address]

## 2015-03-18 LAB — URINE CULTURE: Culture: NO GROWTH

## 2015-03-24 ENCOUNTER — Inpatient Hospital Stay (HOSPITAL_COMMUNITY)
Admission: EM | Admit: 2015-03-24 | Discharge: 2015-04-06 | DRG: 178 | Disposition: A | Discharge status: Hospice | Payer: Medicare HMO | Attending: Internal Medicine | Admitting: Internal Medicine

## 2015-03-24 ENCOUNTER — Encounter (HOSPITAL_COMMUNITY): Payer: Self-pay | Admitting: *Deleted

## 2015-03-24 DIAGNOSIS — R1110 Vomiting, unspecified: Secondary | ICD-10-CM | POA: Diagnosis not present

## 2015-03-24 DIAGNOSIS — C7A8 Other malignant neuroendocrine tumors: Secondary | ICD-10-CM | POA: Diagnosis present

## 2015-03-24 DIAGNOSIS — I10 Essential (primary) hypertension: Secondary | ICD-10-CM | POA: Diagnosis present

## 2015-03-24 DIAGNOSIS — J690 Pneumonitis due to inhalation of food and vomit: Principal | ICD-10-CM | POA: Diagnosis present

## 2015-03-24 DIAGNOSIS — C3491 Malignant neoplasm of unspecified part of right bronchus or lung: Secondary | ICD-10-CM | POA: Diagnosis present

## 2015-03-24 DIAGNOSIS — N179 Acute kidney failure, unspecified: Secondary | ICD-10-CM | POA: Diagnosis present

## 2015-03-24 DIAGNOSIS — R1114 Bilious vomiting: Secondary | ICD-10-CM | POA: Insufficient documentation

## 2015-03-24 DIAGNOSIS — R491 Aphonia: Secondary | ICD-10-CM | POA: Diagnosis present

## 2015-03-24 DIAGNOSIS — T17908A Unspecified foreign body in respiratory tract, part unspecified causing other injury, initial encounter: Secondary | ICD-10-CM

## 2015-03-24 DIAGNOSIS — D7589 Other specified diseases of blood and blood-forming organs: Secondary | ICD-10-CM | POA: Diagnosis present

## 2015-03-24 DIAGNOSIS — I639 Cerebral infarction, unspecified: Secondary | ICD-10-CM

## 2015-03-24 DIAGNOSIS — N189 Chronic kidney disease, unspecified: Secondary | ICD-10-CM | POA: Diagnosis present

## 2015-03-24 DIAGNOSIS — Z8249 Family history of ischemic heart disease and other diseases of the circulatory system: Secondary | ICD-10-CM

## 2015-03-24 DIAGNOSIS — Z79891 Long term (current) use of opiate analgesic: Secondary | ICD-10-CM

## 2015-03-24 DIAGNOSIS — Z66 Do not resuscitate: Secondary | ICD-10-CM | POA: Diagnosis present

## 2015-03-24 DIAGNOSIS — Z87891 Personal history of nicotine dependence: Secondary | ICD-10-CM

## 2015-03-24 DIAGNOSIS — R059 Cough, unspecified: Secondary | ICD-10-CM

## 2015-03-24 DIAGNOSIS — Z923 Personal history of irradiation: Secondary | ICD-10-CM

## 2015-03-24 DIAGNOSIS — Z79899 Other long term (current) drug therapy: Secondary | ICD-10-CM

## 2015-03-24 DIAGNOSIS — Z7401 Bed confinement status: Secondary | ICD-10-CM

## 2015-03-24 DIAGNOSIS — F411 Generalized anxiety disorder: Secondary | ICD-10-CM | POA: Diagnosis present

## 2015-03-24 DIAGNOSIS — R4702 Dysphasia: Secondary | ICD-10-CM | POA: Diagnosis present

## 2015-03-24 DIAGNOSIS — C9000 Multiple myeloma not having achieved remission: Secondary | ICD-10-CM | POA: Diagnosis present

## 2015-03-24 DIAGNOSIS — D649 Anemia, unspecified: Secondary | ICD-10-CM | POA: Diagnosis present

## 2015-03-24 DIAGNOSIS — R05 Cough: Secondary | ICD-10-CM

## 2015-03-24 DIAGNOSIS — Z9221 Personal history of antineoplastic chemotherapy: Secondary | ICD-10-CM

## 2015-03-24 DIAGNOSIS — Z7189 Other specified counseling: Secondary | ICD-10-CM

## 2015-03-24 DIAGNOSIS — F0390 Unspecified dementia without behavioral disturbance: Secondary | ICD-10-CM | POA: Diagnosis present

## 2015-03-24 DIAGNOSIS — Z515 Encounter for palliative care: Secondary | ICD-10-CM

## 2015-03-24 DIAGNOSIS — C7931 Secondary malignant neoplasm of brain: Secondary | ICD-10-CM | POA: Diagnosis present

## 2015-03-24 DIAGNOSIS — E876 Hypokalemia: Secondary | ICD-10-CM | POA: Diagnosis present

## 2015-03-24 DIAGNOSIS — I129 Hypertensive chronic kidney disease with stage 1 through stage 4 chronic kidney disease, or unspecified chronic kidney disease: Secondary | ICD-10-CM | POA: Diagnosis present

## 2015-03-24 DIAGNOSIS — H5442 Blindness, left eye, normal vision right eye: Secondary | ICD-10-CM | POA: Diagnosis present

## 2015-03-24 DIAGNOSIS — C3401 Malignant neoplasm of right main bronchus: Secondary | ICD-10-CM | POA: Diagnosis present

## 2015-03-24 DIAGNOSIS — E871 Hypo-osmolality and hyponatremia: Secondary | ICD-10-CM | POA: Diagnosis present

## 2015-03-24 DIAGNOSIS — R0600 Dyspnea, unspecified: Secondary | ICD-10-CM

## 2015-03-24 DIAGNOSIS — R1310 Dysphagia, unspecified: Secondary | ICD-10-CM | POA: Diagnosis present

## 2015-03-24 DIAGNOSIS — J189 Pneumonia, unspecified organism: Secondary | ICD-10-CM | POA: Diagnosis present

## 2015-03-24 LAB — CBC
HCT: 36.9 % — ABNORMAL LOW (ref 39.0–52.0)
Hemoglobin: 12.1 g/dL — ABNORMAL LOW (ref 13.0–17.0)
MCH: 30.9 pg (ref 26.0–34.0)
MCHC: 32.8 g/dL (ref 30.0–36.0)
MCV: 94.1 fL (ref 78.0–100.0)
Platelets: 235 10*3/uL (ref 150–400)
RBC: 3.92 MIL/uL — ABNORMAL LOW (ref 4.22–5.81)
RDW: 12.5 % (ref 11.5–15.5)
WBC: 8.2 10*3/uL (ref 4.0–10.5)

## 2015-03-24 MED ORDER — ONDANSETRON HCL 4 MG/2ML IJ SOLN
4.0000 mg | Freq: Once | INTRAMUSCULAR | Status: AC | PRN
  Filled 2015-03-24: qty 2

## 2015-03-24 NOTE — ED Notes (Signed)
Pt is coming from [facility]. Staff report pt has been vomiting since about 9pm.  Pt has a hx of dementia.  Pt is currently being treated for PNA, he has been taking amoxicillin.140/90, 100, CBG 97. It appears pr has small cut to bottom lip.

## 2015-03-24 NOTE — ED Notes (Signed)
Bed: WA04 Expected date:  Expected time:  Means of arrival:  Comments: Vomiting

## 2015-03-25 ENCOUNTER — Inpatient Hospital Stay (HOSPITAL_COMMUNITY): Payer: Medicare HMO

## 2015-03-25 ENCOUNTER — Encounter (HOSPITAL_COMMUNITY): Payer: Self-pay

## 2015-03-25 ENCOUNTER — Emergency Department (HOSPITAL_COMMUNITY): Payer: Medicare HMO

## 2015-03-25 ENCOUNTER — Other Ambulatory Visit (HOSPITAL_COMMUNITY): Payer: Medicare HMO

## 2015-03-25 ENCOUNTER — Telehealth: Payer: Self-pay | Admitting: Internal Medicine

## 2015-03-25 DIAGNOSIS — Z789 Other specified health status: Secondary | ICD-10-CM | POA: Diagnosis not present

## 2015-03-25 DIAGNOSIS — Z87891 Personal history of nicotine dependence: Secondary | ICD-10-CM | POA: Diagnosis not present

## 2015-03-25 DIAGNOSIS — Z515 Encounter for palliative care: Secondary | ICD-10-CM | POA: Diagnosis not present

## 2015-03-25 DIAGNOSIS — F0390 Unspecified dementia without behavioral disturbance: Secondary | ICD-10-CM | POA: Diagnosis present

## 2015-03-25 DIAGNOSIS — R1114 Bilious vomiting: Secondary | ICD-10-CM

## 2015-03-25 DIAGNOSIS — H5442 Blindness, left eye, normal vision right eye: Secondary | ICD-10-CM | POA: Diagnosis present

## 2015-03-25 DIAGNOSIS — F411 Generalized anxiety disorder: Secondary | ICD-10-CM | POA: Diagnosis not present

## 2015-03-25 DIAGNOSIS — J189 Pneumonia, unspecified organism: Secondary | ICD-10-CM | POA: Diagnosis not present

## 2015-03-25 DIAGNOSIS — Z923 Personal history of irradiation: Secondary | ICD-10-CM | POA: Diagnosis not present

## 2015-03-25 DIAGNOSIS — R4702 Dysphasia: Secondary | ICD-10-CM | POA: Diagnosis present

## 2015-03-25 DIAGNOSIS — R1310 Dysphagia, unspecified: Secondary | ICD-10-CM | POA: Diagnosis not present

## 2015-03-25 DIAGNOSIS — C7A8 Other malignant neuroendocrine tumors: Secondary | ICD-10-CM | POA: Diagnosis present

## 2015-03-25 DIAGNOSIS — I10 Essential (primary) hypertension: Secondary | ICD-10-CM | POA: Diagnosis not present

## 2015-03-25 DIAGNOSIS — C9000 Multiple myeloma not having achieved remission: Secondary | ICD-10-CM | POA: Diagnosis present

## 2015-03-25 DIAGNOSIS — Z8249 Family history of ischemic heart disease and other diseases of the circulatory system: Secondary | ICD-10-CM | POA: Diagnosis not present

## 2015-03-25 DIAGNOSIS — I129 Hypertensive chronic kidney disease with stage 1 through stage 4 chronic kidney disease, or unspecified chronic kidney disease: Secondary | ICD-10-CM | POA: Diagnosis present

## 2015-03-25 DIAGNOSIS — J150 Pneumonia due to Klebsiella pneumoniae: Secondary | ICD-10-CM | POA: Diagnosis not present

## 2015-03-25 DIAGNOSIS — C7B8 Other secondary neuroendocrine tumors: Secondary | ICD-10-CM | POA: Diagnosis not present

## 2015-03-25 DIAGNOSIS — Z7189 Other specified counseling: Secondary | ICD-10-CM | POA: Diagnosis not present

## 2015-03-25 DIAGNOSIS — R491 Aphonia: Secondary | ICD-10-CM | POA: Diagnosis not present

## 2015-03-25 DIAGNOSIS — E876 Hypokalemia: Secondary | ICD-10-CM | POA: Diagnosis present

## 2015-03-25 DIAGNOSIS — N179 Acute kidney failure, unspecified: Secondary | ICD-10-CM | POA: Diagnosis present

## 2015-03-25 DIAGNOSIS — R1110 Vomiting, unspecified: Secondary | ICD-10-CM | POA: Diagnosis present

## 2015-03-25 DIAGNOSIS — C3401 Malignant neoplasm of right main bronchus: Secondary | ICD-10-CM | POA: Diagnosis not present

## 2015-03-25 DIAGNOSIS — D7589 Other specified diseases of blood and blood-forming organs: Secondary | ICD-10-CM | POA: Diagnosis present

## 2015-03-25 DIAGNOSIS — Z66 Do not resuscitate: Secondary | ICD-10-CM | POA: Diagnosis present

## 2015-03-25 DIAGNOSIS — Z79891 Long term (current) use of opiate analgesic: Secondary | ICD-10-CM | POA: Diagnosis not present

## 2015-03-25 DIAGNOSIS — D649 Anemia, unspecified: Secondary | ICD-10-CM | POA: Diagnosis present

## 2015-03-25 DIAGNOSIS — C7931 Secondary malignant neoplasm of brain: Secondary | ICD-10-CM | POA: Diagnosis not present

## 2015-03-25 DIAGNOSIS — J690 Pneumonitis due to inhalation of food and vomit: Secondary | ICD-10-CM | POA: Diagnosis present

## 2015-03-25 DIAGNOSIS — Z7401 Bed confinement status: Secondary | ICD-10-CM | POA: Diagnosis not present

## 2015-03-25 DIAGNOSIS — C3491 Malignant neoplasm of unspecified part of right bronchus or lung: Secondary | ICD-10-CM | POA: Diagnosis not present

## 2015-03-25 DIAGNOSIS — Z9221 Personal history of antineoplastic chemotherapy: Secondary | ICD-10-CM | POA: Diagnosis not present

## 2015-03-25 DIAGNOSIS — E871 Hypo-osmolality and hyponatremia: Secondary | ICD-10-CM | POA: Diagnosis present

## 2015-03-25 DIAGNOSIS — N189 Chronic kidney disease, unspecified: Secondary | ICD-10-CM | POA: Diagnosis present

## 2015-03-25 DIAGNOSIS — Z79899 Other long term (current) drug therapy: Secondary | ICD-10-CM | POA: Diagnosis not present

## 2015-03-25 LAB — URINALYSIS, ROUTINE W REFLEX MICROSCOPIC
Bilirubin Urine: NEGATIVE
Glucose, UA: NEGATIVE mg/dL
Hgb urine dipstick: NEGATIVE
Ketones, ur: 15 mg/dL — AB
Leukocytes, UA: NEGATIVE
Nitrite: NEGATIVE
Protein, ur: NEGATIVE mg/dL
Specific Gravity, Urine: 1.018 (ref 1.005–1.030)
Urobilinogen, UA: 1 mg/dL (ref 0.0–1.0)
pH: 7 (ref 5.0–8.0)

## 2015-03-25 LAB — MRSA PCR SCREENING: MRSA by PCR: NEGATIVE

## 2015-03-25 LAB — COMPREHENSIVE METABOLIC PANEL
ALT: 13 U/L — ABNORMAL LOW (ref 17–63)
AST: 22 U/L (ref 15–41)
Albumin: 3 g/dL — ABNORMAL LOW (ref 3.5–5.0)
Alkaline Phosphatase: 55 U/L (ref 38–126)
Anion gap: 6 (ref 5–15)
BUN: 8 mg/dL (ref 6–20)
CO2: 27 mmol/L (ref 22–32)
Calcium: 10 mg/dL (ref 8.9–10.3)
Chloride: 97 mmol/L — ABNORMAL LOW (ref 101–111)
Creatinine, Ser: 1.14 mg/dL (ref 0.61–1.24)
GFR calc Af Amer: >60 mL/min (ref >60)
GFR calc non Af Amer: >60 mL/min (ref >60)
Glucose, Bld: 96 mg/dL (ref 65–99)
Potassium: 3.8 mmol/L (ref 3.5–5.1)
Sodium: 130 mmol/L — ABNORMAL LOW (ref 135–145)
Total Bilirubin: 0.8 mg/dL (ref 0.3–1.2)
Total Protein: 10.1 g/dL — ABNORMAL HIGH (ref 6.5–8.1)

## 2015-03-25 LAB — CBC
HCT: 31.7 % — ABNORMAL LOW (ref 39.0–52.0)
Hemoglobin: 10.4 g/dL — ABNORMAL LOW (ref 13.0–17.0)
MCH: 31 pg (ref 26.0–34.0)
MCHC: 32.8 g/dL (ref 30.0–36.0)
MCV: 94.3 fL (ref 78.0–100.0)
PLATELETS: 208 10*3/uL (ref 150–400)
RBC: 3.36 MIL/uL — ABNORMAL LOW (ref 4.22–5.81)
RDW: 12.5 % (ref 11.5–15.5)
WBC: 8.8 10*3/uL (ref 4.0–10.5)

## 2015-03-25 LAB — ABO/RH: ABO/RH(D): O POS

## 2015-03-25 LAB — CREATININE, SERUM
Creatinine, Ser: 1.16 mg/dL (ref 0.61–1.24)
GFR calc Af Amer: >60 mL/min (ref >60)

## 2015-03-25 LAB — POC OCCULT BLOOD, ED: Fecal Occult Bld: NEGATIVE

## 2015-03-25 LAB — I-STAT CG4 LACTIC ACID, ED: Lactic Acid, Venous: 0.88 mmol/L (ref 0.5–2.0)

## 2015-03-25 LAB — LIPASE, BLOOD: Lipase: 28 U/L (ref 22–51)

## 2015-03-25 MED ORDER — SENNOSIDES-DOCUSATE SODIUM 8.6-50 MG PO TABS: 1.0000 | ORAL_TABLET | Freq: Every evening | ORAL | Status: DC | PRN

## 2015-03-25 MED ORDER — AMLODIPINE BESYLATE 10 MG PO TABS
10.0000 mg | ORAL_TABLET | Freq: Every day | ORAL | Status: DC
  Administered 2015-04-01 – 2015-04-03 (×3): 10 mg via ORAL
  Filled 2015-03-25 (×4): qty 1

## 2015-03-25 MED ORDER — ACETAMINOPHEN 650 MG RE SUPP
650.0000 mg | Freq: Four times a day (QID) | RECTAL | Status: DC | PRN
  Administered 2015-03-25: 650 mg via RECTAL
  Filled 2015-03-25: qty 1

## 2015-03-25 MED ORDER — PIPERACILLIN-TAZOBACTAM 3.375 G IVPB 30 MIN
3.3750 g | Freq: Once | INTRAVENOUS | Status: AC
  Administered 2015-03-25: 3.375 g via INTRAVENOUS
  Filled 2015-03-25: qty 50

## 2015-03-25 MED ORDER — CETYLPYRIDINIUM CHLORIDE 0.05 % MT LIQD
7.0000 mL | Freq: Two times a day (BID) | OROMUCOSAL | Status: DC
  Administered 2015-03-25 – 2015-04-05 (×21): 7 mL via OROMUCOSAL

## 2015-03-25 MED ORDER — BUDESONIDE-FORMOTEROL FUMARATE 160-4.5 MCG/ACT IN AERO
2.0000 | INHALATION_SPRAY | Freq: Two times a day (BID) | RESPIRATORY_TRACT | Status: DC
  Administered 2015-03-25 – 2015-04-04 (×18): 2 via RESPIRATORY_TRACT
  Filled 2015-03-25 (×2): qty 6

## 2015-03-25 MED ORDER — IOHEXOL 350 MG/ML SOLN
100.0000 mL | Freq: Once | INTRAVENOUS | Status: AC | PRN
  Administered 2015-03-25: 100 mL via INTRAVENOUS

## 2015-03-25 MED ORDER — ACETAMINOPHEN 325 MG PO TABS: 650.0000 mg | ORAL_TABLET | Freq: Four times a day (QID) | ORAL | Status: DC | PRN

## 2015-03-25 MED ORDER — PROMETHAZINE HCL 25 MG PO TABS
12.5000 mg | ORAL_TABLET | Freq: Four times a day (QID) | ORAL | Status: DC | PRN
  Filled 2015-03-25: qty 1

## 2015-03-25 MED ORDER — CHLORHEXIDINE GLUCONATE 0.12 % MT SOLN
15.0000 mL | Freq: Two times a day (BID) | OROMUCOSAL | Status: DC
  Administered 2015-03-25 – 2015-04-06 (×23): 15 mL via OROMUCOSAL
  Filled 2015-03-25 (×19): qty 15

## 2015-03-25 MED ORDER — VANCOMYCIN HCL IN DEXTROSE 1-5 GM/200ML-% IV SOLN
1000.0000 mg | Freq: Once | INTRAVENOUS | Status: AC
  Administered 2015-03-25: 1000 mg via INTRAVENOUS
  Filled 2015-03-25: qty 200

## 2015-03-25 MED ORDER — PIPERACILLIN-TAZOBACTAM 3.375 G IVPB
3.3750 g | Freq: Three times a day (TID) | INTRAVENOUS | Status: DC
  Administered 2015-03-25 – 2015-04-03 (×28): 3.375 g via INTRAVENOUS
  Filled 2015-03-25 (×29): qty 50

## 2015-03-25 MED ORDER — VANCOMYCIN HCL IN DEXTROSE 750-5 MG/150ML-% IV SOLN
750.0000 mg | Freq: Two times a day (BID) | INTRAVENOUS | Status: DC
  Administered 2015-03-25 – 2015-03-26 (×2): 750 mg via INTRAVENOUS
  Filled 2015-03-25 (×3): qty 150

## 2015-03-25 MED ORDER — HEPARIN SODIUM (PORCINE) 5000 UNIT/ML IJ SOLN
5000.0000 [IU] | Freq: Three times a day (TID) | INTRAMUSCULAR | Status: DC
  Administered 2015-03-25 – 2015-04-01 (×20): 5000 [IU] via SUBCUTANEOUS
  Filled 2015-03-25 (×21): qty 1

## 2015-03-25 MED ORDER — IPRATROPIUM-ALBUTEROL 0.5-2.5 (3) MG/3ML IN SOLN: 3.0000 mL | RESPIRATORY_TRACT | Status: DC | PRN

## 2015-03-25 MED ORDER — SODIUM CHLORIDE 0.9 % IV BOLUS (SEPSIS)
500.0000 mL | Freq: Once | INTRAVENOUS | Status: AC
  Administered 2015-03-25: 500 mL via INTRAVENOUS

## 2015-03-25 NOTE — ED Provider Notes (Signed)
CSN: [id number]     Arrival date & time 03/24/15  2225 History   First MD Initiated Contact with Patient 03/24/15 2318     Chief Complaint  Patient presents with  . Emesis     (Consider location/radiation/quality/duration/timing/severity/associated sxs/prior Treatment) HPI Patient presents to the emergency department with vomiting that occurred tonight.  The staff is concerned because he vomited dark material and they were worried about aspiration.  He was sent in for evaluation as the only history that was given.  The patient is nonverbal and does not give me any history Past Medical History  Diagnosis Date  . Syncope   . Dehydration   . Blind left eye   . Hypertension   . Anemia     "low Blood"  . Dementia     poor memory  . Cancer     Lung  . Lung cancer 08/12/14    Right lung   Past Surgical History  Procedure Laterality Date  . Video bronchoscopy Bilateral 07/29/2014    Procedure: VIDEO BRONCHOSCOPY WITHOUT FLUORO;  Surgeon: Tanda Rockers, MD;  Location: [facility] ENDOSCOPY;  Service: Cardiopulmonary;  Laterality: Bilateral;  . Video bronchoscopy Bilateral 08/12/2014    Procedure: VIDEO BRONCHOSCOPY WITHOUT FLUORO;  Surgeon: Wilhelmina Mcardle, MD;  Location: [facility] ENDOSCOPY;  Service: Cardiopulmonary;  Laterality: Bilateral;   Family History  Problem Relation Age of Onset  . Hypertension Brother    History  Substance Use Topics  . Smoking status: Former Smoker -- 1.00 packs/day for 38 years    Quit date: 06/24/2014  . Smokeless tobacco: Never Used  . Alcohol Use: No    Review of Systems  Level V caveat applies due to noncommunicative patient  Allergies  Review of patient's allergies indicates no known allergies.  Home Medications   Prior to Admission medications   Medication Sig Start Date End Date Taking? Authorizing Provider  amLODipine (NORVASC) 10 MG tablet Take 1 tablet (10 mg total) by mouth daily. 10/14/14  Yes Susanne Borders, NP  budesonide-formoterol  (SYMBICORT) 160-4.5 MCG/ACT inhaler Inhale 2 puffs into the lungs 2 (two) times daily. 10/14/14  Yes Susanne Borders, NP  Dextrose-Sodium Chloride (DEXTROSE 5 % AND 0.45 % NACL) 5-0.45 % Inject 100 mL/hr into the vein continuous.   Yes Historical Provider, MD  ipratropium-albuterol (DUONEB) 0.5-2.5 (3) MG/3ML SOLN Take 3 mLs by nebulization every 6 (six) hours. 08/09/14  Yes Thurnell Lose, MD  Multiple Vitamins-Minerals (CENTROVITE) TABS TAKE 1 TABLET BY MOUTH ONCE DAILY 02/27/15  Yes Curt Bears, MD  nystatin (MYCOSTATIN) 100000 UNIT/ML suspension Take 5 mLs by mouth 4 (four) times daily. For 5 days   Yes Historical Provider, MD  potassium chloride 20 MEQ TBCR Take 20 mEq by mouth daily. 03/17/15  Yes Theodis Blaze, MD  Probiotic Product (PROBIOTIC DAILY PO) Take 1 tablet by mouth daily.   Yes Historical Provider, MD  prochlorperazine (COMPAZINE) 10 MG tablet TAKE 1 TABLET EVERY 6 HOURS AS NEEDED FOR NAUSEA OR VOMITING 02/27/15  Yes Curt Bears, MD  acetaminophen (TYLENOL) 325 MG tablet Take 325-650 mg by mouth every 6 (six) hours as needed for moderate pain or fever.    Historical Provider, MD  albuterol (PROVENTIL) (2.5 MG/3ML) 0.083% nebulizer solution Take 3 mLs (2.5 mg total) by nebulization every 4 (four) hours as needed for wheezing or shortness of breath. 03/17/15   Theodis Blaze, MD  amoxicillin-clavulanate (AUGMENTIN) 500-125 MG per tablet Take 1 tablet (500 mg total) by mouth  3 (three) times daily. 03/17/15   Theodis Blaze, MD  guaiFENesin (ROBITUSSIN) 100 MG/5ML liquid Take 200 mg by mouth 3 (three) times daily as needed for cough.    Historical Provider, MD  oxyCODONE (OXY IR/ROXICODONE) 5 MG immediate release tablet Take 1 tablet (5 mg total) by mouth every 4 (four) hours as needed for moderate pain. 03/17/15   Theodis Blaze, MD   BP 137/101 mmHg  Pulse 100  Temp(Src) 101.8 F (38.8 C) (Rectal)  Resp 33  SpO2 100% Physical Exam  Constitutional: He is oriented to person, place,  and time. He appears well-developed and well-nourished. No distress.  HENT:  Head: Normocephalic and atraumatic.  Mouth/Throat: Oropharynx is clear and moist.  Eyes: Pupils are equal, round, and reactive to light.  Neck: Normal range of motion. Neck supple.  Cardiovascular: Normal rate, regular rhythm and normal heart sounds.  Exam reveals no gallop and no friction rub.   No murmur heard. Pulmonary/Chest: Effort normal and breath sounds normal. No respiratory distress.  Abdominal: Soft. Bowel sounds are normal. He exhibits no distension. There is no tenderness.  Musculoskeletal: He exhibits no edema.  Neurological: He is alert and oriented to person, place, and time. He exhibits normal muscle tone. Coordination normal.  Skin: Skin is warm and dry. No rash noted. No erythema.  Nursing note and vitals reviewed.   ED Course  Procedures (including critical care time) Labs Review Labs Reviewed  COMPREHENSIVE METABOLIC PANEL - Abnormal; Notable for the following:    Sodium 130 (*)    Chloride 97 (*)    Total Protein 10.1 (*)    Albumin 3.0 (*)    ALT 13 (*)    All other components within normal limits  CBC - Abnormal; Notable for the following:    RBC 3.92 (*)    Hemoglobin 12.1 (*)    HCT 36.9 (*)    All other components within normal limits  URINALYSIS, ROUTINE W REFLEX MICROSCOPIC (NOT AT [facility]) - Abnormal; Notable for the following:    Ketones, ur 15 (*)    All other components within normal limits  CULTURE, BLOOD (ROUTINE X 2)  CULTURE, BLOOD (ROUTINE X 2)  LIPASE, BLOOD  I-STAT CG4 LACTIC ACID, ED  POC OCCULT BLOOD, ED  TYPE AND SCREEN  ABO/RH    Imaging Review Dg Chest 2 View  03/25/2015   CLINICAL DATA:  Vomiting blood, acute onset.  Initial encounter.  EXAM: CHEST  2 VIEW  COMPARISON:  Chest radiograph performed 03/16/2015, and CTA of the chest performed 01/14/2015  FINDINGS: Dense consolidation of a portion of the right upper lobe is grossly unchanged from prior  studies, reflecting the known underlying mass. The left lung appears clear. No new superimposed pneumonia is characterized. No pleural effusion or pneumothorax is identified.  The heart is normal in size. No acute osseous abnormalities are identified.  IMPRESSION: Dense consolidation of a portion of the right upper lung lobe is grossly unchanged, reflecting the known underlying mass. No superimposed pneumonia seen.   Electronically Signed   By: Garald Balding M.D.   On: 03/25/2015 01:11    Patient is been stable here in the emergency department.  Will get a CT of his chest to look for any further cause of infection.  Otherwise, the patient is not have any obvious signs of infection at this time    Dalia Heading, PA-C 03/25/15 3546  April Palumbo, MD 03/25/15 [phone]

## 2015-03-25 NOTE — H&P (Signed)
PCP:   Elizabeth Palau, MD   Chief Complaint:  Feeling ill  HPI: This is a nonverbal patient. History provided solely by the ER physician. This is 69 year old male who was sent in because he did not feel well. He was recently treated for pneumonia. He has lung cancer for which he sees a radiation oncologist. Repeat CT done in the ear reveals persistent pneumonia allegoric mass. The hospitalist service has been asked to admit.  Review of Systems:  Unable to obtain   Past Medical History: Past Medical History  Diagnosis Date  . Syncope   . Dehydration   . Blind left eye   . Hypertension   . Anemia     "low Blood"  . Dementia     poor memory  . Cancer     Lung  . Lung cancer 08/12/14    Right lung   Past Surgical History  Procedure Laterality Date  . Video bronchoscopy Bilateral 07/29/2014    Procedure: VIDEO BRONCHOSCOPY WITHOUT FLUORO;  Surgeon: Tanda Rockers, MD;  Location: [facility] ENDOSCOPY;  Service: Cardiopulmonary;  Laterality: Bilateral;  . Video bronchoscopy Bilateral 08/12/2014    Procedure: VIDEO BRONCHOSCOPY WITHOUT FLUORO;  Surgeon: Wilhelmina Mcardle, MD;  Location: [facility] ENDOSCOPY;  Service: Cardiopulmonary;  Laterality: Bilateral;    Medications: Prior to Admission medications   Medication Sig Start Date End Date Taking? Authorizing Provider  amLODipine (NORVASC) 10 MG tablet Take 1 tablet (10 mg total) by mouth daily. 10/14/14  Yes Susanne Borders, NP  budesonide-formoterol (SYMBICORT) 160-4.5 MCG/ACT inhaler Inhale 2 puffs into the lungs 2 (two) times daily. 10/14/14  Yes Susanne Borders, NP  Dextrose-Sodium Chloride (DEXTROSE 5 % AND 0.45 % NACL) 5-0.45 % Inject 100 mL/hr into the vein continuous.   Yes Historical Provider, MD  ipratropium-albuterol (DUONEB) 0.5-2.5 (3) MG/3ML SOLN Take 3 mLs by nebulization every 6 (six) hours. 08/09/14  Yes Thurnell Lose, MD  Multiple Vitamins-Minerals (CENTROVITE) TABS TAKE 1 TABLET BY MOUTH ONCE DAILY 02/27/15  Yes Curt Bears, MD  nystatin (MYCOSTATIN) 100000 UNIT/ML suspension Take 5 mLs by mouth 4 (four) times daily. For 5 days   Yes Historical Provider, MD  potassium chloride 20 MEQ TBCR Take 20 mEq by mouth daily. 03/17/15  Yes Theodis Blaze, MD  Probiotic Product (PROBIOTIC DAILY PO) Take 1 tablet by mouth daily.   Yes Historical Provider, MD  prochlorperazine (COMPAZINE) 10 MG tablet TAKE 1 TABLET EVERY 6 HOURS AS NEEDED FOR NAUSEA OR VOMITING 02/27/15  Yes Curt Bears, MD  acetaminophen (TYLENOL) 325 MG tablet Take 325-650 mg by mouth every 6 (six) hours as needed for moderate pain or fever.    Historical Provider, MD  albuterol (PROVENTIL) (2.5 MG/3ML) 0.083% nebulizer solution Take 3 mLs (2.5 mg total) by nebulization every 4 (four) hours as needed for wheezing or shortness of breath. 03/17/15   Theodis Blaze, MD  amoxicillin-clavulanate (AUGMENTIN) 500-125 MG per tablet Take 1 tablet (500 mg total) by mouth 3 (three) times daily. 03/17/15   Theodis Blaze, MD  guaiFENesin (ROBITUSSIN) 100 MG/5ML liquid Take 200 mg by mouth 3 (three) times daily as needed for cough.    Historical Provider, MD  oxyCODONE (OXY IR/ROXICODONE) 5 MG immediate release tablet Take 1 tablet (5 mg total) by mouth every 4 (four) hours as needed for moderate pain. 03/17/15   Theodis Blaze, MD    Allergies:  No Known Allergies  Social History:  reports that he quit smoking about  9 months ago. He has never used smokeless tobacco. He reports that he does not drink alcohol or use illicit drugs.  Family History: Family History  Problem Relation Age of Onset  . Hypertension Brother     Physical Exam: Filed Vitals:   03/25/15 0316 03/25/15 0430 03/25/15 0500 03/25/15 0517  BP: 139/100 133/103 143/88 143/88  Pulse: 87  91 88  Temp: 100.9 F (38.3 C)     TempSrc: Rectal     Resp: 28 25  35  SpO2: 97%  98% 96%    General:  Awake Eyes: PERRLA, pink conjunctiva, no scleral icterus ENT: Moist oral mucosa, neck supple, no  thyromegaly Lungs: clear to ascultation, no wheeze, no crackles, no use of accessory muscles Cardiovascular: regular rate and rhythm, no regurgitation, no gallops, no murmurs. No carotid bruits, no JVD Abdomen: soft, positive BS, non-tender, non-distended, no organomegaly, not an acute abdomen GU: not examined Neuro: CN II - XII grossly intact, sensation intact Musculoskeletal: strength 5/5 all extremities, no clubbing, cyanosis or edema Skin: no rash, no subcutaneous crepitation, no decubitus   Labs on Admission:   Recent Labs  03/24/15 2336  NA 130*  K 3.8  CL 97*  CO2 27  GLUCOSE 96  BUN 8  CREATININE 1.14  CALCIUM 10.0    Recent Labs  03/24/15 2336  AST 22  ALT 13*  ALKPHOS 55  BILITOT 0.8  PROT 10.1*  ALBUMIN 3.0*    Recent Labs  03/24/15 2336  LIPASE 28    Recent Labs  03/24/15 2336  WBC 8.2  HGB 12.1*  HCT 36.9*  MCV 94.1  PLT 235   No results for input(s): CKTOTAL, CKMB, CKMBINDEX, TROPONINI in the last 72 hours. Invalid input(s): POCBNP No results for input(s): DDIMER in the last 72 hours. No results for input(s): HGBA1C in the last 72 hours. No results for input(s): CHOL, HDL, LDLCALC, TRIG, CHOLHDL, LDLDIRECT in the last 72 hours. No results for input(s): TSH, T4TOTAL, T3FREE, THYROIDAB in the last 72 hours.  Invalid input(s): FREET3 No results for input(s): VITAMINB12, FOLATE, FERRITIN, TIBC, IRON, RETICCTPCT in the last 72 hours.  Micro Results: Recent Results (from the past 240 hour(s))  Culture, Urine     Status: None   Collection Time: 03/16/15  3:04 PM  Result Value Ref Range Status   Specimen Description URINE, RANDOM  Final   Special Requests NONE  Final   Culture   Final    NO GROWTH 2 DAYS Performed at [facility]    Report Status 03/18/2015 FINAL  Final     Radiological Exams on Admission: Dg Chest 2 View  03/25/2015   CLINICAL DATA:  Vomiting blood, acute onset.  Initial encounter.  EXAM: CHEST  2 VIEW   COMPARISON:  Chest radiograph performed 03/16/2015, and CTA of the chest performed 01/14/2015  FINDINGS: Dense consolidation of a portion of the right upper lobe is grossly unchanged from prior studies, reflecting the known underlying mass. The left lung appears clear. No new superimposed pneumonia is characterized. No pleural effusion or pneumothorax is identified.  The heart is normal in size. No acute osseous abnormalities are identified.  IMPRESSION: Dense consolidation of a portion of the right upper lung lobe is grossly unchanged, reflecting the known underlying mass. No superimposed pneumonia seen.   Electronically Signed   By: Garald Balding M.D.   On: 03/25/2015 01:11   Ct Angio Chest Pe W/cm &/or Wo Cm  03/25/2015   CLINICAL DATA:  Acute onset of nausea and vomiting. Cough. Current history of right-sided lung cancer. Initial encounter.  EXAM: CT ANGIOGRAPHY CHEST WITH CONTRAST  TECHNIQUE: Multidetector CT imaging of the chest was performed using the standard protocol during bolus administration of intravenous contrast. Multiplanar CT image reconstructions and MIPs were obtained to evaluate the vascular anatomy.  CONTRAST:  156m OMNIPAQUE IOHEXOL 350 MG/ML SOLN  COMPARISON:  Chest radiograph performed earlier today at 12:20 a.m., and CTA of the chest performed 01/04/2015  FINDINGS: There is no evidence of pulmonary embolus.  Recurrent patchy opacities are noted at the right lower lobe, with an associated small right pleural effusion, concerning for recurrent pneumonia. This is less prominent than in May.  Mass about the right hilum and tracking into the collapsed portion of the right upper lobe is improved, with visualization of part of the right mainstem bronchus, new from the prior study. There is persistent mass effect on the right mainstem bronchus and right-sided bronchioles.  No pneumothorax is identified. The left lung is essentially clear. Mild nodular scarring is noted at the right lung apex.   There is partial extension of mass into the right paratracheal region. A mildly enlarged azygoesophageal recess node is seen, measuring 1.4 cm in short axis. This is concerning for metastatic disease. Scattered periaortic nodes remain normal in size. No pericardial effusion is identified. The great vessels are grossly unremarkable in appearance. No axillary lymphadenopathy is seen. The visualized portions of the thyroid gland are unremarkable in appearance.  The visualized portions of the liver and spleen are unremarkable.  No acute osseous abnormalities are seen. Anterior bridging osteophytes are noted along the thoracic spine.  Review of the MIP images confirms the above findings.  IMPRESSION: 1. No evidence of pulmonary embolus. 2. Recurrent patchy opacities at the right lower lung lobe, with associated small right pleural effusion, concerning for recurrent pneumonia. 3. Mass about the right hilum and tracking into the collapsed portion of the right upper lobe is improved in appearance, with visualization of part of the right mainstem bronchus (was completely collapsed on the prior CTA). Residual mass-effect on the right mainstem bronchus and right-sided bronchioles. 4. Mildly enlarged azygoesophageal recess node is concerning for metastatic disease. Partial extension of mass into the right paratracheal region.   Electronically Signed   By: JGarald BaldingM.D.   On: 03/25/2015 03:44    Assessment/Plan Present on Admission:  . Pneumonia -Admit to med telemetry -Blood cultures and urine cultures collected, -Zosyn, vancomycin ordered -Respiratory to eval and treat Hyponatremia -Replete with IV fluids. Even though SIADH in DDx, hyponatremia is new may correct with simple fluids . Essential hypertension -Stable, home medications resumed  . Lung cancer, main bronchus -Being followed by oncology  Romaine Maciolek 03/25/2015, 5:42 AM

## 2015-03-25 NOTE — Progress Notes (Signed)
ANTIBIOTIC CONSULT NOTE - INITIAL  Pharmacy Consult for vancomycin, Zosyn Indication: HCAP  No Known Allergies  Patient Measurements: Height: '5\' 8"'$  (172.7 cm) Weight: 151 lb 3.8 oz (68.6 kg) IBW/kg (Calculated) : 68.4  Vital Signs: Temp: 99.3 F (37.4 C) (07/20 0625) Temp Source: Oral (07/20 0625) BP: 129/94 mmHg (07/20 0625) Pulse Rate: 87 (07/20 0625) Intake/Output from previous day:   Intake/Output from this shift:    Labs:  Recent Labs  07/Stephen/16 2336 03/25/15 0758  WBC 8.2 8.8  HGB 12.1* 10.4*  PLT 235 208  CREATININE 1.14  --    Estimated Creatinine Clearance: 60.8 mL/min (by C-G formula based on Cr of 1.14). No results for input(s): VANCOTROUGH, VANCOPEAK, VANCORANDOM, GENTTROUGH, GENTPEAK, GENTRANDOM, TOBRATROUGH, TOBRAPEAK, TOBRARND, AMIKACINPEAK, AMIKACINTROU, AMIKACIN in the last 72 hours.   Microbiology: Recent Results (from the past 720 hour(s))  Culture, blood (routine x 2)     Status: None   Collection Time: 03/12/15  6:00 PM  Result Value Ref Range Status   Specimen Description BLOOD LEFT ANTECUBITAL  Final   Special Requests BOTTLES DRAWN AEROBIC AND ANAEROBIC 5CC EACH  Final   Culture   Final    NO GROWTH 5 DAYS Performed at [facility]    Report Status 03/17/2015 FINAL  Final  Culture, blood (routine x 2)     Status: None   Collection Time: 03/12/15  6:30 PM  Result Value Ref Range Status   Specimen Description BLOOD RIGHT ANTECUBITAL  Final   Special Requests BOTTLES DRAWN AEROBIC AND ANAEROBIC 5ML  Final   Culture   Final    NO GROWTH 5 DAYS Performed at [facility]    Report Status 03/17/2015 FINAL  Final  Urine culture     Status: None   Collection Time: 03/12/15  8:24 PM  Result Value Ref Range Status   Specimen Description URINE, CATHETERIZED  Final   Special Requests NONE  Final   Culture   Final    NO GROWTH 2 DAYS Performed at [facility]    Report Status 03/14/2015 FINAL  Final  MRSA PCR Screening      Status: None   Collection Time: 03/13/15  1:35 AM  Result Value Ref Range Status   MRSA by PCR NEGATIVE NEGATIVE Final    Comment:        The GeneXpert MRSA Assay (FDA approved for NASAL specimens only), is one component of a comprehensive MRSA colonization surveillance program. It is not intended to diagnose MRSA infection nor to guide or monitor treatment for MRSA infections.   Culture, Urine     Status: None   Collection Time: 03/16/15  3:04 PM  Result Value Ref Range Status   Specimen Description URINE, RANDOM  Final   Special Requests NONE  Final   Culture   Final    NO GROWTH 2 DAYS Performed at [facility]    Report Status 03/18/2015 FINAL  Final    Medical History: Past Medical History  Diagnosis Date  . Syncope   . Dehydration   . Blind left eye   . Hypertension   . Anemia     "low Blood"  . Dementia     poor memory  . Cancer     Lung  . Lung cancer 08/12/14    Right lung    Medications:  Prescriptions prior to admission  Medication Sig Dispense Refill Last Dose  . amLODipine (NORVASC) 10 MG tablet Take 1 tablet (10  mg total) by mouth daily. 30 tablet 2 7/Stephen/2016 at Unknown time  . budesonide-formoterol (SYMBICORT) 160-4.5 MCG/ACT inhaler Inhale 2 puffs into the lungs 2 (two) times daily. 1 Inhaler 12 7/Stephen/2016 at Unknown time  . Dextrose-Sodium Chloride (DEXTROSE 5 % AND 0.45 % NACL) 5-0.45 % Inject 100 mL/hr into the vein continuous.   7/Stephen/2016 at Unknown time  . ipratropium-albuterol (DUONEB) 0.5-2.5 (3) MG/3ML SOLN Take 3 mLs by nebulization every 6 (six) hours. 360 mL 1 7/Stephen/2016 at Unknown time  . Multiple Vitamins-Minerals (CENTROVITE) TABS TAKE 1 TABLET BY MOUTH ONCE DAILY 130 tablet 0 7/Stephen/2016 at Unknown time  . nystatin (MYCOSTATIN) 100000 UNIT/ML suspension Take 5 mLs by mouth 4 (four) times daily. For 5 days   7/Stephen/2016 at Unknown time  . potassium chloride 20 MEQ TBCR Take 20 mEq by mouth daily. 20 tablet 0 7/Stephen/2016 at Unknown  time  . Probiotic Product (PROBIOTIC DAILY PO) Take 1 tablet by mouth daily.   7/Stephen/2016 at Unknown time  . prochlorperazine (COMPAZINE) 10 MG tablet TAKE 1 TABLET EVERY 6 HOURS AS NEEDED FOR NAUSEA OR VOMITING 30 tablet 0 Past Week at Unknown time  . acetaminophen (TYLENOL) 325 MG tablet Take 325-650 mg by mouth every 6 (six) hours as needed for moderate pain or fever.   PRN  . albuterol (PROVENTIL) (2.5 MG/3ML) 0.083% nebulizer solution Take 3 mLs (2.5 mg total) by nebulization every 4 (four) hours as needed for wheezing or shortness of breath. 75 mL 0 PRN  . amoxicillin-clavulanate (AUGMENTIN) 500-125 MG per tablet Take 1 tablet (500 mg total) by mouth 3 (three) times daily. 12 tablet 0 03/19/2015  . guaiFENesin (ROBITUSSIN) 100 MG/5ML liquid Take 200 mg by mouth 3 (three) times daily as needed for cough.   PRN  . oxyCODONE (OXY IR/ROXICODONE) 5 MG immediate release tablet Take 1 tablet (5 mg total) by mouth every 4 (four) hours as needed for moderate pain. 30 tablet 0 PRN   Anti-infectives    Start     Dose/Rate Route Frequency Ordered Stop   03/25/15 0400  vancomycin (VANCOCIN) IVPB 1000 mg/200 mL premix     1,000 mg 200 mL/hr over 60 Minutes Intravenous  Once 03/25/15 0355 03/25/15 0602   03/25/15 0400  piperacillin-tazobactam (ZOSYN) IVPB 3.375 g     3.375 g 100 mL/hr over 30 Minutes Intravenous  Once 03/25/15 0355 03/25/15 0501     Assessment: 69 y.o. Ellis with lung cancer treated with radiation admitted 7/Stephen/2016 for pneumonia.  Recently admitted and treated for aspiration pneumonia earlier this month.  CT chest reveals recurrent RLL opacities concerning for PNA.  To begin vancomycin and Zosyn per pharmacy.  First doses given in ED.  Noted to have received vancomycin during Dec 2015 (was 10 kg heavier at that time)  7/20 >> vancomycin >> 7/20 >> Zosyn >>    Temp: 101.8 WBC: wnl Renal: SCr 1.16; CrCl 60 CG  7/20 blood: IP   Goal of Therapy:  Vancomycin trough level 15-20  mcg/ml  Eradication of infection Appropriate antibiotic dosing for indication and renal function  Plan:  Day 1 antibiotics Vancomycin 750 mg IV q12 hr Measure vancomycin trough levels at steady state as indicated Zosyn 3.375 g IV given every 8 hrs by 4-hr infusion  Follow clinical course, renal function, culture results as available  Follow for de-escalation of antibiotics and LOT   Reuel Boom, PharmD, BCPS Pager: [phone] 03/25/2015, 8:32 AM

## 2015-03-25 NOTE — Evaluation (Signed)
SLP Cancellation Note  Patient Details Name: Stephen Ellis MRN: [id number] DOB: 13-Jun-1946   Cancelled treatment:       Reason Eval/Treat Not Completed: Other (comment) (attempt to see pt, being taken for CT scan, will continue efforts, spoke to nephew [name] who reports pt has not consumed intake for several weeks and today vomited dark colored emesis)   Luanna Salk, [place] [facility] SLP [phone]

## 2015-03-25 NOTE — Telephone Encounter (Signed)
s.w. pt son and cx lab due to in hospital..Marland KitchenMarland Kitchen

## 2015-03-25 NOTE — H&P (Signed)
[facility] PROGRESS NOTE  JAK HAGGAR KJZ:[id number] DOB: Dec 31, 1945 DOA: 03/24/2015 PCP: Elizabeth Palau, MD  HPI/Subjective: 69 year old male with lung cancer with neuroendocrine diff, plasma cell dyscrasia, HTN, dementia and anemia presented to the ED for vomiting. Patient is non-verbal, history provided by the ED notes. He presented from [place] for vomiting dark material and not feeling well. He is being treated for pneumonia and is currently taking amoxicillin. He was also recently treated for pneumonia multiples time in the hospital. Discharged on 12/29/14, treated with cefepime and zyvox;  discharged on 01/09/15 treated with zosyn;  discharged on 03/17/15 treated with zosyn.  He has lung cancer and is followed by radiation oncology as well as by Dr. Earlie Server at the cancer center  - he and his nephew, who is his healthcare power of attorney, decided to stop chemotherapy in May 2016. His most recent radiation treatment was 01/05/15-01/22/15.  His nephew states that patient has also had a wet cough for the past 3 weeks.  A CT was performed in the ED that revealed possible recurrent pneumonia due to a right pleural effusion and recurrent patchy opacities at RLL.  A mass at the right hilum was visualized, causing a mass effect on the right mainstem bronchus, with an azygoesophageal node concerning for metastatic disease. He has a history of smoking 1 ppd x 50 years, he stopped in 2015.  Upon evaluation today, patient is unable to speak, though he demonstrates comprehension of what is being said and follows commands. Per brother and nephew, the patient has been non-verbal for approximately 3-4 weeks. He has been mumbling at times, but has not been able to communicate coherently. Brother states there have been no known falls.   Assessment/Plan: 1. Pneumonia - Patient is afebrile currently no tachycardia, tachypnea or elevated WBC although does have cancer history - On Vanc and  Zosyn, dosing per pharmacy - Chest CT completed in ED shows findings more consistent with spread of cancer - Blood and urine cultures pending - Symbicort BID, duoneb PRN  2. Hypertension - Controlled at 129/94 - On amlodipine  3. Metastatic Lung Cancer, Right hilum-currently stage IV disease given new metastases - Being following by radiation oncology -Has recently completed XRT  -CT scan of the head done 7/20 = metastatic 2 cm round mass in the left parietal region -patient has previously declined systemic chemotherapy in May 2015 -I discussed the case briefly with Dr. Earlie Server who feels palliative care should be involved -We will try to discuss holistically with the nephew tomorrow.--Nephew seems to indicate that he would want chemotherapy and radiation for treatment. -I did explain clearly that I'm not sure what options Mr. Talarico has and I do not think that the options are curative rather they are palliative. -Defer further decision making and expert discussion per oncology - Phenergran PRN for nausea  4. Aphonia-Probably secondary to metastatic spread - Per brother and nephew, change in baseline three-four weeks ago - Generalized right sided weakness - SLP consulted  5-prior plasma cell dyscrasia/myeloma Defer to oncology  6 reported dementia -Patient not on any home meds -Re-orient and monitor  Code Status: Full Family Communication: Patient Disposition Plan:   Antibiotics:  Vanc 03/24/15 >>   Zosyn 03/24/15 >>   Objective: Filed Vitals:   03/25/15 0625  BP: 129/94  Pulse: 87  Temp: 99.3 F (37.4 C)  Resp: 95   No intake or output data in the 24 hours ending 03/25/15 0801 Filed Weights   03/25/15 9480  Weight: 68.6 kg (151 lb 3.8 oz)    Exam:   General: Patient seated in bed in no obvious distress, is unable to communicate but demonstrates understanding and is able to follow commands, he is coughing repeatedly and is unable to clear secretions himself,  he does have a slight right-sided droop on the face and slightly weaker on the right side   Cardiovascular: RRR, no m/r/g, no LE edema  Respiratory: Wet cough, normal respiratory effort, decreased breath sounds bilaterally  Abdomen: Soft, non-tender  Musculoskeletal: Normal muscle tone  Neurologic: Decreased strength on R compared to L, unable to assess cranial nerves  Data Reviewed: Basic Metabolic Panel:  Recent Labs Lab 03/24/15 2336  NA 130*  K 3.8  CL 97*  CO2 27  GLUCOSE 96  BUN 8  CREATININE 1.14  CALCIUM 10.0   Liver Function Tests:  Recent Labs Lab 03/24/15 2336  AST 22  ALT 13*  ALKPHOS 55  BILITOT 0.8  PROT 10.1*  ALBUMIN 3.0*    Recent Labs Lab 03/24/15 2336  LIPASE 28   CBC:  Recent Labs Lab 03/24/15 2336  WBC 8.2  HGB 12.1*  HCT 36.9*  MCV 94.1  PLT 235    Recent Results (from the past 240 hour(s))  Culture, Urine     Status: None   Collection Time: 03/16/15  3:04 PM  Result Value Ref Range Status   Specimen Description URINE, RANDOM  Final   Special Requests NONE  Final   Culture   Final    NO GROWTH 2 DAYS Performed at [facility]    Report Status 03/18/2015 FINAL  Final     Studies: Dg Chest 2 View  03/25/2015   CLINICAL DATA:  Vomiting blood, acute onset.  Initial encounter.  EXAM: CHEST  2 VIEW  COMPARISON:  Chest radiograph performed 03/16/2015, and CTA of the chest performed 01/14/2015  FINDINGS: Dense consolidation of a portion of the right upper lobe is grossly unchanged from prior studies, reflecting the known underlying mass. The left lung appears clear. No new superimposed pneumonia is characterized. No pleural effusion or pneumothorax is identified.  The heart is normal in size. No acute osseous abnormalities are identified.  IMPRESSION: Dense consolidation of a portion of the right upper lung lobe is grossly unchanged, reflecting the known underlying mass. No superimposed pneumonia seen.   Electronically  Signed   By: Garald Balding M.D.   On: 03/25/2015 01:11   Ct Angio Chest Pe W/cm &/or Wo Cm  03/25/2015   CLINICAL DATA:  Acute onset of nausea and vomiting. Cough. Current history of right-sided lung cancer. Initial encounter.  EXAM: CT ANGIOGRAPHY CHEST WITH CONTRAST  TECHNIQUE: Multidetector CT imaging of the chest was performed using the standard protocol during bolus administration of intravenous contrast. Multiplanar CT image reconstructions and MIPs were obtained to evaluate the vascular anatomy.  CONTRAST:  170m OMNIPAQUE IOHEXOL 350 MG/ML SOLN  COMPARISON:  Chest radiograph performed earlier today at 12:20 a.m., and CTA of the chest performed 01/04/2015  FINDINGS: There is no evidence of pulmonary embolus.  Recurrent patchy opacities are noted at the right lower lobe, with an associated small right pleural effusion, concerning for recurrent pneumonia. This is less prominent than in May.  Mass about the right hilum and tracking into the collapsed portion of the right upper lobe is improved, with visualization of part of the right mainstem bronchus, new from the prior study. There is persistent mass effect on the right mainstem  bronchus and right-sided bronchioles.  No pneumothorax is identified. The left lung is essentially clear. Mild nodular scarring is noted at the right lung apex.  There is partial extension of mass into the right paratracheal region. A mildly enlarged azygoesophageal recess node is seen, measuring 1.4 cm in short axis. This is concerning for metastatic disease. Scattered periaortic nodes remain normal in size. No pericardial effusion is identified. The great vessels are grossly unremarkable in appearance. No axillary lymphadenopathy is seen. The visualized portions of the thyroid gland are unremarkable in appearance.  The visualized portions of the liver and spleen are unremarkable.  No acute osseous abnormalities are seen. Anterior bridging osteophytes are noted along the thoracic  spine.  Review of the MIP images confirms the above findings.  IMPRESSION: 1. No evidence of pulmonary embolus. 2. Recurrent patchy opacities at the right lower lung lobe, with associated small right pleural effusion, concerning for recurrent pneumonia. 3. Mass about the right hilum and tracking into the collapsed portion of the right upper lobe is improved in appearance, with visualization of part of the right mainstem bronchus (was completely collapsed on the prior CTA). Residual mass-effect on the right mainstem bronchus and right-sided bronchioles. 4. Mildly enlarged azygoesophageal recess node is concerning for metastatic disease. Partial extension of mass into the right paratracheal region.   Electronically Signed   By: Garald Balding M.D.   On: 03/25/2015 03:44    Scheduled Meds: . amLODipine  10 mg Oral Daily  . antiseptic oral rinse  7 mL Mouth Rinse q12n4p  . budesonide-formoterol  2 puff Inhalation BID  . chlorhexidine  15 mL Mouth Rinse BID  . heparin  5,000 Units Subcutaneous 3 times per day    Active Problems:   Essential hypertension   Pneumonia   Lung cancer, main bronchus  Time spent: [place], PA-S Verneita Griffes, MD  [facility] Pager [phone]. If 7PM-7AM, please contact night-coverage at [email or web address], password [facility] 03/25/2015, 8:01 AM  LOS: 0 days

## 2015-03-26 ENCOUNTER — Ambulatory Visit: Payer: Medicare HMO | Admitting: Internal Medicine

## 2015-03-26 DIAGNOSIS — C3401 Malignant neoplasm of right main bronchus: Secondary | ICD-10-CM

## 2015-03-26 LAB — COMPREHENSIVE METABOLIC PANEL
ALT: 9 U/L — ABNORMAL LOW (ref 17–63)
AST: 17 U/L (ref 15–41)
Albumin: 2.4 g/dL — ABNORMAL LOW (ref 3.5–5.0)
Alkaline Phosphatase: 43 U/L (ref 38–126)
Anion gap: 11 (ref 5–15)
BUN: 16 mg/dL (ref 6–20)
CALCIUM: 9.5 mg/dL (ref 8.9–10.3)
CHLORIDE: 99 mmol/L — AB (ref 101–111)
CO2: 23 mmol/L (ref 22–32)
Creatinine, Ser: 1.53 mg/dL — ABNORMAL HIGH (ref 0.61–1.24)
GFR, EST AFRICAN AMERICAN: 53 mL/min — AB (ref >60)
GFR, EST NON AFRICAN AMERICAN: 45 mL/min — AB (ref >60)
Glucose, Bld: 95 mg/dL (ref 65–99)
POTASSIUM: 3.5 mmol/L (ref 3.5–5.1)
SODIUM: 133 mmol/L — AB (ref 135–145)
TOTAL PROTEIN: 8.3 g/dL — AB (ref 6.5–8.1)
Total Bilirubin: 0.9 mg/dL (ref 0.3–1.2)

## 2015-03-26 LAB — CBC WITH DIFFERENTIAL/PLATELET
Basophils Absolute: 0.1 10*3/uL (ref 0.0–0.1)
Basophils Relative: 1 % (ref 0–1)
EOS ABS: 0 10*3/uL (ref 0.0–0.7)
EOS PCT: 0 % (ref 0–5)
HCT: 30.7 % — ABNORMAL LOW (ref 39.0–52.0)
HEMOGLOBIN: 10.4 g/dL — AB (ref 13.0–17.0)
LYMPHS ABS: 1 10*3/uL (ref 0.7–4.0)
LYMPHS PCT: 10 % — AB (ref 12–46)
MCH: 31.3 pg (ref 26.0–34.0)
MCHC: 33.9 g/dL (ref 30.0–36.0)
MCV: 92.5 fL (ref 78.0–100.0)
Monocytes Absolute: 0.7 10*3/uL (ref 0.1–1.0)
Monocytes Relative: 7 % (ref 3–12)
NEUTROS ABS: 7.9 10*3/uL — AB (ref 1.7–7.7)
Neutrophils Relative %: 82 % — ABNORMAL HIGH (ref 43–77)
Platelets: 210 10*3/uL (ref 150–400)
RBC: 3.32 MIL/uL — ABNORMAL LOW (ref 4.22–5.81)
RDW: 12.6 % (ref 11.5–15.5)
WBC: 9.7 10*3/uL (ref 4.0–10.5)

## 2015-03-26 LAB — PROTIME-INR
INR: 1.67 — AB (ref 0.00–1.49)
Prothrombin Time: 19.7 seconds — ABNORMAL HIGH (ref 11.6–15.2)

## 2015-03-26 MED ORDER — VANCOMYCIN HCL 500 MG IV SOLR
500.0000 mg | Freq: Two times a day (BID) | INTRAVENOUS | Status: DC
  Administered 2015-03-26 – 2015-03-27 (×2): 500 mg via INTRAVENOUS
  Filled 2015-03-26 (×3): qty 500

## 2015-03-26 MED ORDER — DEXAMETHASONE SODIUM PHOSPHATE 10 MG/ML IJ SOLN
10.0000 mg | Freq: Every day | INTRAMUSCULAR | Status: DC
  Administered 2015-03-26 – 2015-03-30 (×5): 10 mg via INTRAVENOUS
  Filled 2015-03-26 (×6): qty 1

## 2015-03-26 MED ORDER — LORAZEPAM 2 MG/ML IJ SOLN: 1.0000 mg | INTRAMUSCULAR | Status: DC | PRN

## 2015-03-26 NOTE — Progress Notes (Signed)
CSW received referral that pt admitted from [facility].   CSW reviewed chart and noted that attending MD spoke to oncologist who feel palliative care should be involved.  CSW to await further clarification about pt goals of care in order to determine disposition planning.   CSW to continue to follow and assess as appropriate.  Alison Murray, MSW, [employer] [phone]

## 2015-03-26 NOTE — Progress Notes (Signed)
[facility] PROGRESS NOTE  FINES KIMBERLIN AXK:[id number] DOB: May 18, 1946 DOA: 03/24/2015 PCP: Elizabeth Palau, MD  HPI/Subjective:  69 year old male with lung cancer with neuroendocrine diff, plasma cell dyscrasia, HTN, dementia and anemia presented to the ED on 03/24/15 for vomiting. He has been treated multiple times in the past few months for aspiration and post-obstructive pneumonia. His most recent treatments of chemotherapy and radiation for lung cancer were in May 2016.  Patient is non-verbal and per family he has had a wet cough x 3 weeks, has been vomiting the past few days and has been non-verbal for the past 3-4 weeks with no known falls. Speech therapy has been involved.    TODAY , patient remains non-verbal but was able to follow commands and answer questions by either nodding or shaking his head. He was interacting more readily than yesterday, 03/25/15. He is not currently experiencing pain anywhere, no difficulty breathing, no chest pain, no nausea, no vomiting, no abdominal pain. He still has a persistent cough and is hungry.   Assessment/Plan: 1.  Pneumonia, aspiration versus postobstructive  - Patient is afebrile currently no tachycardia, tachypnea or elevated WBC although does have cancer history - WBC increased to 9.7 from 8.8; neutrophils 82%  - On Vanc and Zosyn, dosing per pharmacy - Chest CT completed in ED shows findings more consistent with spread of cancer although patient does have a history of postobstructive pneumonia -MAXIMUM TEMPERATURE yesterday 101.3 however no fever overnight -Narrow rapidly to monotherapy with Zosyn in a.m. 7/22 - Blood cultures pending7/24 sets performed and all are pending - Symbicort BID, duoneb PRN -Speech therapy recommends nothing by mouth state and consideration ofaphasia assessment -Palliative care to assist with discussions regarding feeding versus artificial or parenteral feeds although I do not think either is a good  option long-term  2. Hypertension - Controlled at 113/80 - On amlodipine 10 mg daily  3. Metastatic Lung Cancer, Right hilum-currently stage IV disease given new metastases - Being following by radiation oncology -Has recently completed XRT  -CT scan of the head done 7/20 = metastatic 2 cm round mass in the left parietal region  -patient has previously declined systemic chemotherapy in May 2015 -Discussed poor prognosis and limited treatment options with nephew, HCPA, and patient's brother via phone and offered palliative consult. Nephew would like to discuss with family and will advise of course of action this afternoon. Patient remains full code at this time. Explained that options are not curative for Mr. Gibbs given invasive spread of cancer. Offered to coordinate a conversation with Dr. Inda Merlin and family if needed.  - Phenergran PRN for nausea - On Decadron to reduce swelling in brain resulting from met and ativan PRN for an seizure-like activity - Palliative care has been consulted to see the patient in consult and delineate goals of care as well as trajectory  4. Aphonia - secondary to 2 X3 centimeter metastatic spread to left parietal region of the brain - Per brother and nephew, change in baseline three-four weeks ago - Generalized right sided weakness, more weakness in right upper extremity than right lower extremity - SLP consulted   5- Prior plasma cell dyscrasia/myeloma - Defer to oncology  6 Reported dementia - Patient was alert and interacted appropriately today, though non-verbal -Patient not on any home meds -Re-orient and monitor  Code Status: Full Family Communication: Patient Disposition Plan: Remain inpatient   Consultants:    Antibiotics:  Vanc 03/24/15 >>   Zosyn 03/24/15 >>  Objective: Filed Vitals:   03/26/15 0442  BP: 113/80  Pulse: 94  Temp: 99.3 F (37.4 C)  Resp: 20    Intake/Output Summary (Last 24 hours) at 03/26/15  0941 Last data filed at 03/26/15 0445  Gross per 24 hour  Intake    450 ml  Output    800 ml  Net   -350 ml   Filed Weights   03/25/15 0625  Weight: 68.6 kg (151 lb 3.8 oz)    Exam:   General: Patient is seated in bed, in no acute distress, non-verbal, but able to interact appropriately by nodding/shaknig his head. Alert and able to follow commands without hesitation.   Cardiovascular: RRR, no m/r/g, No LE edema, 2+ DP pulses bilaterally  Respiratory: Clear to auscultation. Wet cough. Normal respiratory effort.  Abdomen: Bowel sounds present, soft, non-tender, not distended  Musculoskeletal: Grossly normal tone.   Neurologic: Follows commands appropriately, 5/5 strength BLE, decreased grip strength R arm vs L arm, unable to lift right arm. Sensation intact BUE. No facial droop.   Data Reviewed: Basic Metabolic Panel:  Recent Labs Lab 03/24/15 2336 03/25/15 0758 03/26/15 0426  NA 130*  --  133*  K 3.8  --  3.5  CL 97*  --  99*  CO2 27  --  23  GLUCOSE 96  --  95  BUN 8  --  16  CREATININE 1.14 1.16 1.53*  CALCIUM 10.0  --  9.5   Liver Function Tests:  Recent Labs Lab 03/24/15 2336 03/26/15 0426  AST 22 17  ALT 13* 9*  ALKPHOS 55 43  BILITOT 0.8 0.9  PROT 10.1* 8.3*  ALBUMIN 3.0* 2.4*    Recent Labs Lab 03/24/15 2336  LIPASE 28   CBC:  Recent Labs Lab 03/24/15 2336 03/25/15 0758 03/26/15 0426  WBC 8.2 8.8 9.7  NEUTROABS  --   --  7.9*  HGB 12.1* 10.4* 10.4*  HCT 36.9* 31.7* 30.7*  MCV 94.1 94.3 92.5  PLT 235 208 210    Recent Results (from the past 240 hour(s))  Culture, Urine     Status: None   Collection Time: 03/16/15  3:04 PM  Result Value Ref Range Status   Specimen Description URINE, RANDOM  Final   Special Requests NONE  Final   Culture   Final    NO GROWTH 2 DAYS Performed at [facility]    Report Status 03/18/2015 FINAL  Final  MRSA PCR Screening     Status: None   Collection Time: 03/25/15  8:56 AM  Result  Value Ref Range Status   MRSA by PCR NEGATIVE NEGATIVE Final    Comment:        The GeneXpert MRSA Assay (FDA approved for NASAL specimens only), is one component of a comprehensive MRSA colonization surveillance program. It is not intended to diagnose MRSA infection nor to guide or monitor treatment for MRSA infections.   Culture, blood (routine x 2)     Status: None (Preliminary result)   Collection Time: 03/25/15  2:30 PM  Result Value Ref Range Status   Specimen Description   Final    BLOOD RIGHT ANTECUBITAL Performed at [place]  Final   Culture PENDING  Incomplete   Report Status PENDING  Incomplete     Studies: Dg Chest 2 View  03/25/2015   CLINICAL DATA:  Vomiting blood, acute onset.  Initial encounter.  EXAM: CHEST  2 VIEW  COMPARISON:  Chest radiograph performed 03/16/2015, and CTA of the chest performed 01/14/2015  FINDINGS: Dense consolidation of a portion of the right upper lobe is grossly unchanged from prior studies, reflecting the known underlying mass. The left lung appears clear. No new superimposed pneumonia is characterized. No pleural effusion or pneumothorax is identified.  The heart is normal in size. No acute osseous abnormalities are identified.  IMPRESSION: Dense consolidation of a portion of the right upper lung lobe is grossly unchanged, reflecting the known underlying mass. No superimposed pneumonia seen.   Electronically Signed   By: Garald Balding M.D.   On: 03/25/2015 01:11   Ct Head Wo Contrast  03/25/2015   CLINICAL DATA:  Stroke-like symptoms  EXAM: CT HEAD WITHOUT CONTRAST  TECHNIQUE: Contiguous axial images were obtained from the base of the skull through the vertex without intravenous contrast.  COMPARISON:  09/10/2004  FINDINGS: The bony calvarium is intact. No gross soft tissue abnormality is noted. Mild atrophic changes are noted. In the left parietal region there is a 2.1 x  2.9 cm peripherally dense mass lesion with associated mass-effect and very minimal midline shift of 2 mm from left to right. Mass-effect upon the adjacent gyri is noted as well. Additionally there is an area of increased attenuation in the left temporal lobe best seen on image number 9 of series 2. This may represent a small metastatic deposit. Some regular density is noted in the superior cerebellum on image number 12 of series 2 which may also represent a focal mass lesion. No acute hemorrhage is seen. Scattered chronic white matter ischemic changes are seen.  IMPRESSION: Left parietal mass lesion with associated mass-effect as well as suggestion of smaller lesions in the left temporal lobe and superior cerebellum on the left consistent with metastatic disease. MRI examination with contrast material is recommended for further evaluation.  These results will be called to the ordering clinician or representative by the Radiologist Assistant, and communication documented in the PACS or zVision Dashboard.   Electronically Signed   By: Inez Catalina M.D.   On: 03/25/2015 15:25   Ct Angio Chest Pe W/cm &/or Wo Cm  03/25/2015   CLINICAL DATA:  Acute onset of nausea and vomiting. Cough. Current history of right-sided lung cancer. Initial encounter.  EXAM: CT ANGIOGRAPHY CHEST WITH CONTRAST  TECHNIQUE: Multidetector CT imaging of the chest was performed using the standard protocol during bolus administration of intravenous contrast. Multiplanar CT image reconstructions and MIPs were obtained to evaluate the vascular anatomy.  CONTRAST:  143m OMNIPAQUE IOHEXOL 350 MG/ML SOLN  COMPARISON:  Chest radiograph performed earlier today at 12:20 a.m., and CTA of the chest performed 01/04/2015  FINDINGS: There is no evidence of pulmonary embolus.  Recurrent patchy opacities are noted at the right lower lobe, with an associated small right pleural effusion, concerning for recurrent pneumonia. This is less prominent than in May.   Mass about the right hilum and tracking into the collapsed portion of the right upper lobe is improved, with visualization of part of the right mainstem bronchus, new from the prior study. There is persistent mass effect on the right mainstem bronchus and right-sided bronchioles.  No pneumothorax is identified. The left lung is essentially clear. Mild nodular scarring is noted at the right lung apex.  There is partial extension of mass into the right paratracheal region. A mildly enlarged azygoesophageal recess node is seen, measuring 1.4 cm in short axis. This is concerning  for metastatic disease. Scattered periaortic nodes remain normal in size. No pericardial effusion is identified. The great vessels are grossly unremarkable in appearance. No axillary lymphadenopathy is seen. The visualized portions of the thyroid gland are unremarkable in appearance.  The visualized portions of the liver and spleen are unremarkable.  No acute osseous abnormalities are seen. Anterior bridging osteophytes are noted along the thoracic spine.  Review of the MIP images confirms the above findings.  IMPRESSION: 1. No evidence of pulmonary embolus. 2. Recurrent patchy opacities at the right lower lung lobe, with associated small right pleural effusion, concerning for recurrent pneumonia. 3. Mass about the right hilum and tracking into the collapsed portion of the right upper lobe is improved in appearance, with visualization of part of the right mainstem bronchus (was completely collapsed on the prior CTA). Residual mass-effect on the right mainstem bronchus and right-sided bronchioles. 4. Mildly enlarged azygoesophageal recess node is concerning for metastatic disease. Partial extension of mass into the right paratracheal region.   Electronically Signed   By: Garald Balding M.D.   On: 03/25/2015 03:44    Scheduled Meds: . amLODipine  10 mg Oral Daily  . antiseptic oral rinse  7 mL Mouth Rinse q12n4p  . budesonide-formoterol  2  puff Inhalation BID  . chlorhexidine  15 mL Mouth Rinse BID  . heparin  5,000 Units Subcutaneous 3 times per day  . piperacillin-tazobactam (ZOSYN)  IV  3.375 g Intravenous Q8H  . vancomycin  750 mg Intravenous Q12H    Active Problems:   Essential hypertension   Pneumonia   Lung cancer, main bronchus   Aphonia  Time spent: [address]  Fleet Contras, PA-S Verneita Griffes, MD [facility] Pager [phone]. If 7PM-7AM, please contact night-coverage at [email or web address], password [facility] 03/26/2015, 9:41 AM  LOS: 1 day

## 2015-03-26 NOTE — Progress Notes (Signed)
Pharmacy - Antibiotic dosing  Assessment: 69 y.o. male with lung cancer treated with radiation admitted 03/24/2015 for pneumonia; being treated with vancomycin and Zosyn per pharmacy  7/20 >> vancomycin >> 7/20 >> Zosyn >>    Tmax: 99.3, improved WBC: wnl Renal: SCr bumped overnight; CrCl now 45 CG  7/19 blood: ngtd 7/20 blood: ngtd MRSA nasal swab: neg  Plan:  Reduce vancomycin to 500 mg IV q12 hr with bump in SCr  Continue Zosyn extended infusion as ordered  F/u SCr  Reuel Boom, PharmD, BCPS Pager: [phone] 03/26/2015, 3:06 PM

## 2015-03-26 NOTE — Evaluation (Signed)
Clinical/Bedside Swallow Evaluation Patient Details  Name: Stephen Ellis MRN: [id number] Date of Birth: [birth year]  Today's Date: 03/26/2015 Time: SLP Start Time (ACUTE ONLY): 1442 SLP Stop Time (ACUTE ONLY): 1458 SLP Time Calculation (min) (ACUTE ONLY): 16 min  Past Medical History:  Past Medical History  Diagnosis Date  . Syncope   . Dehydration   . Blind left eye   . Hypertension   . Anemia     "low Blood"  . Dementia     poor memory  . Lung cancer 08/12/14    Right lung   Past Surgical History:  Past Surgical History  Procedure Laterality Date  . Video bronchoscopy Bilateral 07/29/2014    Procedure: VIDEO BRONCHOSCOPY WITHOUT FLUORO;  Surgeon: Tanda Rockers, MD;  Location: [facility] ENDOSCOPY;  Service: Cardiopulmonary;  Laterality: Bilateral;  . Video bronchoscopy Bilateral 08/12/2014    Procedure: VIDEO BRONCHOSCOPY WITHOUT FLUORO;  Surgeon: Wilhelmina Mcardle, MD;  Location: [facility] ENDOSCOPY;  Service: Cardiopulmonary;  Laterality: Bilateral;   HPI:  Patient is a 69 year old man admitted after vomiting dark material with concern for aspiration.  PMH + with history of lung cancer status post chemotherapy in April and radiation that finished in May with a history of postobstructive pneumonia and difficulty swallowing who presents to the hospital with c/o increased difficulty swallowing and lethargy. CXR shows residual or recurrent RLL PNA.  CT chest indicated 1. No evidence of pulmonary embolus. 2. Recurrent patchy opacities at the right lower lung lobe, with associated right pleural effusion concerning for pna,  Mildly enlarged azygoesophageal recess node is concerning for metastatic disease. Partial extension of mass into the right paratracheal region.     Assessment / Plan / Recommendation Clinical Impression  Pt has very limited oral intake with offers of thin liquid and pureed trials. With Max encouragement, pt will part his lips for oral acceptance, but has little labial closure  leading to subsequent anterior loss of most liquid boluses. Purees and thin liquids are both orally held, requiring oral suction for removal. No purees are orally transited, and very small amounts of thin liquid that are swallowed result in delayed coughing. Given bedside presentation, would recommend to continue NPO with reassessment on next date to determine readiness to participate in [place].    Aspiration Risk  Severe    Diet Recommendation NPO   Medication Administration: Via alternative means    Other  Recommendations Recommended Consults: Consider esophageal assessment Oral Care Recommendations: Oral care QID   Frequency and Duration min 2x/week  2 weeks   Pertinent Vitals/Pain n/a    SLP Swallow Goals     Swallow Study Prior Functional Status       General Date of Onset: 03/25/15 Other Pertinent Information: Patient is a 69 year old man admitted after vomiting dark material with concern for aspiration.  PMH + with history of lung cancer status post chemotherapy in April and radiation that finished in May with a history of postobstructive pneumonia and difficulty swallowing who presents to the hospital with c/o increased difficulty swallowing and lethargy. CXR shows residual or recurrent RLL PNA.  CT chest indicated 1. No evidence of pulmonary embolus. 2. Recurrent patchy opacities at the right lower lung lobe, with associated right pleural effusion concerning for pna,  Mildly enlarged azygoesophageal recess node is concerning for metastatic disease. Partial extension of mass into the right paratracheal region.   Type of Study: Bedside swallow evaluation Previous Swallow Assessment: BSE 7/8 recommending Dys 1/nectar, esophageal w/u Diet Prior to this  Study: NPO Temperature Spikes Noted: Yes (101.3) Respiratory Status: Room air History of Recent Intubation: No Behavior/Cognition: Alert;Cooperative;Requires cueing Oral Cavity - Dentition: Missing dentition Self-Feeding Abilities:  Needs assist Patient Positioning: Upright in bed Baseline Vocal Quality: Normal    Oral/Motor/Sensory Function     Ice Chips Ice chips: Not tested   Thin Liquid Thin Liquid: Impaired Presentation: Cup;Self Fed Oral Phase Impairments: Poor awareness of bolus;Reduced labial seal Oral Phase Functional Implications: Right anterior spillage;Left anterior spillage;Oral holding Pharyngeal  Phase Impairments: Cough - Delayed    Nectar Thick Nectar Thick Liquid: Not tested   Honey Thick Honey Thick Liquid: Not tested   Puree Puree: Impaired Presentation: Spoon Oral Phase Impairments: Reduced labial seal;Impaired anterior to posterior transit Oral Phase Functional Implications: Oral holding   Solid      Solid: Not tested      [name], M.A. CCC-SLP [phone]  [name] 03/26/2015,3:07 PM

## 2015-03-27 ENCOUNTER — Other Ambulatory Visit: Payer: Medicare HMO

## 2015-03-27 ENCOUNTER — Inpatient Hospital Stay (HOSPITAL_COMMUNITY): Payer: Medicare HMO

## 2015-03-27 DIAGNOSIS — J150 Pneumonia due to Klebsiella pneumoniae: Secondary | ICD-10-CM

## 2015-03-27 NOTE — Progress Notes (Signed)
PT Cancellation Note  Patient Details Name: Stephen Ellis MRN: [id number] DOB: 1946/05/05   Cancelled Treatment:    Reason Eval/Treat Not Completed: Patient at procedure or test/unavailable (SLP MBS)   York Ram E 03/27/2015, 2:09 PM Carmelia Bake, PT, DPT 03/27/2015 Pager: [phone]

## 2015-03-27 NOTE — Progress Notes (Signed)
[facility] PROGRESS NOTE  Stephen Ellis CBU:[id number] DOB: 1946/07/15 DOA: 03/24/2015 PCP: Stephen Palau, MD  HPI/Subjective: 69 year old male with lung cancer with neuroendocrine diff, plasma cell dyscrasia, HTN, dementia and anemia presented to the ED on 03/24/15 for vomiting. He has been treated multiple times in the past few months for aspiration and post-obstructive pneumonia. His most recent treatments of chemotherapy and radiation for lung cancer were in May 2016.  Patient is non-verbal and per family he has had a wet cough x 3 weeks, has been vomiting the past few days and has been non-verbal for the past 3-4 weeks with no known falls. Speech therapy has been involved.   Today, the patient reports feeling well overall with no pain or discomfort.  At systems is limited as patient is aphonic    Assessment/Plan: 1. Pneumonia, aspiration versus postobstructive - Patient is afebrile currently no tachycardia, tachypnea or elevated WBC although does have cancer history - WBC increased to 9.7 from 8.8; neutrophils 82%  - Has been afebrile since 03/25/15 - On Zosyn, dosing per pharmacy, discontinue Vanc 03/27/15 - Chest CT: Consistent with spread of cancer although patient does have a history of postobstructive and aspiration pneumonia - Blood cultures pending 7/24 sets performed and all are pending - Symbicort BID, duoneb PRN -Speech therapy recommends MBS which was performed and we will need to discuss further with family as below -Palliative care to assist with discussions regarding feeding versus artificial or parenteral feeds, although I do not think either is a good option long-term  2. Hypertension - Stable - On amlodipine 10 mg daily  3. Metastatic Lung Cancer, Right hilum-currently stage IV disease given new metastases - Being following by radiation oncology -Has recently completed XRT  -CT scan of the head done 7/20 = metastatic 2 cm round mass in the left  parietal region  -patient has previously declined systemic chemotherapy in May 2015 - Phenergran PRN for nausea - Decadron to reduce swelling in brain resulting from met and ativan PRN for an seizure-like activity - On 03/26/15 discussed poor prognosis and limited treatment options with nephew, HCPA, and patient's brother and offered palliative consult. Patient remains full code at this time. Explained that options are not curative for Stephen Ellis given invasive spread of cancer. Offered to coordinate a conversation with Stephen Ellis and family if needed. Still awaiting decision from family of how to proceed with care.  - Palliative care has been consulted to see the patient and delineate goals of care as well as trajectory  4. 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 Prior plasma cell dyscrasia/myeloma - Defer to oncology  6. Acute Kidney Injury  - BUN 16 Cr 1.5 GFR minimally depressed to 53 down from 60 - Awaiting goals of care and intent of therapy - We will discuss with family moving forward  7. Reported dementia - Patient was alert and interacted appropriately today, though non-verbal -Patient not on any home meds -Re-orient and monitor    Code Status: Full Family Communication: Patient and family Disposition Plan: Remain inpatient  Consultants:  Palliative  SLP  Antibiotics:  Vanc 03/24/15 >> 03/27/15  Zosyn  03/24/15 >>  Objective: Filed Vitals:   03/27/15 0405  BP: 139/94  Pulse: 70  Temp: 97.8 F (36.6 C)  Resp: 18    Intake/Output Summary (Last 24 hours) at 03/27/15  0954 Last data filed at 03/27/15 3875  Gross per 24 hour  Intake    200 ml  Output    575 ml  Net   -375 ml   Filed Weights   03/25/15 0625  Weight: 68.6 kg (151 lb 3.8 oz)    Exam:   General:  Patient laying in bed comfortably, non-verbal, attempts to mumble, appears to be alert as he is able to answer questions by nodding/shaking his head  Cardiovascular: RRR, no m/r/g, no LE edema, 2+ DP pulses  Respiratory: Clear to ausculation, some upper respiratory congestion heard, occasional cough, normal respiratory effort  Abdomen: Bowel sounds present, soft, non-tender  Musculoskeletal: 4/5 strength right upper extremity, 5/5 strength left upper extremity, 5/5 strength BLE, grossly normal tone, no right sided facial drooping,    Data Reviewed: Basic Metabolic Panel:  Recent Labs Lab 03/24/15 2336 03/25/15 0758 03/26/15 0426  NA 130*  --  133*  K 3.8  --  3.5  CL 97*  --  99*  CO2 27  --  23  GLUCOSE 96  --  95  BUN 8  --  16  CREATININE 1.14 1.16 1.53*  CALCIUM 10.0  --  9.5   Liver Function Tests:  Recent Labs Lab 03/24/15 2336 03/26/15 0426  AST 22 17  ALT 13* 9*  ALKPHOS 55 43  BILITOT 0.8 0.9  PROT 10.1* 8.3*  ALBUMIN 3.0* 2.4*    Recent Labs Lab 03/24/15 2336  LIPASE 28   CBC:  Recent Labs Lab 03/24/15 2336 03/25/15 0758 03/26/15 0426  WBC 8.2 8.8 9.7  NEUTROABS  --   --  7.9*  HGB 12.1* 10.4* 10.4*  HCT 36.9* 31.7* 30.7*  MCV 94.1 94.3 92.5  PLT 235 208 210    Recent Results (from the past 240 hour(s))  Blood culture (routine x 2)     Status: None (Preliminary result)   Collection Time: 03/24/15 11:51 PM  Result Value Ref Range Status   Specimen Description BLOOD LEFT FOREARM  Final   Special Requests BOTTLES DRAWN AEROBIC AND ANAEROBIC 5ML  Final   Culture   Final    NO GROWTH 1 DAY Performed at [facility]    Report Status PENDING  Incomplete  Blood culture (routine x 2)     Status: None (Preliminary result)    Collection Time: 03/25/15 12:00 AM  Result Value Ref Range Status   Specimen Description BLOOD LEFT HAND  Final   Special Requests BOTTLES DRAWN AEROBIC ONLY 4ML  Final   Culture   Final    NO GROWTH 1 DAY Performed at [facility]    Report Status PENDING  Incomplete  MRSA PCR Screening     Status: None   Collection Time: 03/25/15  8:56 AM  Result Value Ref Range Status   MRSA by PCR NEGATIVE NEGATIVE Final    Comment:        The GeneXpert MRSA Assay (FDA approved for NASAL specimens only), is one component of a comprehensive MRSA colonization surveillance program. It is not intended to diagnose MRSA infection nor to guide or monitor treatment for MRSA infections.   Culture, blood (routine x 2)     Status: None (Preliminary result)   Collection Time: 03/25/15  2:25 PM  Result Value Ref Range Status   Specimen Description BLOOD LEFT HAND  Final   Special Requests BOTTLES DRAWN AEROBIC AND ANAEROBIC  10CC  Final   Culture   Final    NO GROWTH < 24 HOURS Performed at [facility]    Report Status PENDING  Incomplete  Culture, blood (routine x 2)     Status: None (Preliminary result)   Collection Time: 03/25/15  2:30 PM  Result Value Ref Range Status   Specimen Description BLOOD RIGHT ANTECUBITAL  Final   Special Requests BOTTLES DRAWN AEROBIC AND ANAEROBIC  10CC  Final   Culture   Final    NO GROWTH < 24 HOURS Performed at [facility]    Report Status PENDING  Incomplete   Studies: Ct Head Wo Contrast  03/25/2015   CLINICAL DATA:  Stroke-like symptoms  EXAM: CT HEAD WITHOUT CONTRAST  TECHNIQUE: Contiguous axial images were obtained from the base of the skull through the vertex without intravenous contrast.  COMPARISON:  09/10/2004  FINDINGS: The bony calvarium is intact. No gross soft tissue abnormality is noted. Mild atrophic changes are noted. In the left parietal region there is a 2.1 x 2.9 cm peripherally dense mass lesion with associated  mass-effect and very minimal midline shift of 2 mm from left to right. Mass-effect upon the adjacent gyri is noted as well. Additionally there is an area of increased attenuation in the left temporal lobe best seen on image number 9 of series 2. This may represent a small metastatic deposit. Some regular density is noted in the superior cerebellum on image number 12 of series 2 which may also represent a focal mass lesion. No acute hemorrhage is seen. Scattered chronic white matter ischemic changes are seen.  IMPRESSION: Left parietal mass lesion with associated mass-effect as well as suggestion of smaller lesions in the left temporal lobe and superior cerebellum on the left consistent with metastatic disease. MRI examination with contrast material is recommended for further evaluation.  These results will be called to the ordering clinician or representative by the Radiologist Assistant, and communication documented in the PACS or zVision Dashboard.   Electronically Signed   By: Inez Catalina M.D.   On: 03/25/2015 15:25    Scheduled Meds: . amLODipine  10 mg Oral Daily  . antiseptic oral rinse  7 mL Mouth Rinse q12n4p  . budesonide-formoterol  2 puff Inhalation BID  . chlorhexidine  15 mL Mouth Rinse BID  . dexamethasone  10 mg Intravenous Daily  . heparin  5,000 Units Subcutaneous 3 times per day  . piperacillin-tazobactam (ZOSYN)  IV  3.375 g Intravenous Q8H  . vancomycin  500 mg Intravenous Q12H   Active Problems:   Essential hypertension   Pneumonia   Lung cancer, main bronchus   Aphonia  Time spent: [place], PA-S Verneita Griffes, MD [facility] Pager [phone]. If 7PM-7AM, please contact night-coverage at [email or web address], password [facility] 03/27/2015, 9:54 AM  LOS: 2 days

## 2015-03-27 NOTE — Progress Notes (Signed)
Speech Language Pathology Treatment: Dysphagia  Patient Details Name: Stephen Ellis MRN: [id number] DOB: April 06, 1946 Today's Date: 03/27/2015 Time: 5670-1410 SLP Time Calculation (min) (ACUTE ONLY): 13 min  Assessment / Plan / Recommendation Clinical Impression  Pt seen for PO trials to determine readiness to participate in MBS. Pt eager to drink water and held cup with assist with each sip. Pt purses lips for sip and briefly holds bolus with slight anterior spill due to decreased labial closure. Piecemeal swallow occurred without any cues until bolus completely transited. 1/4 trials resulted in immediate cough. Small sips were better controlled. Pt firmly refused any solid trials. Pt able to attempt MBS today to determine safety with PO.    HPI Other Pertinent Information: Patient is a 68 year old man admitted after vomiting dark material with concern for aspiration.  PMH + with history of lung cancer status post chemotherapy in April and radiation that finished in May with a history of postobstructive pneumonia and difficulty swallowing who presents to the hospital with c/o increased difficulty swallowing and lethargy. CXR shows residual or recurrent RLL PNA.  CT chest indicated 1. No evidence of pulmonary embolus. 2. Recurrent patchy opacities at the right lower lung lobe, with associated right pleural effusion concerning for pna,  Mildly enlarged azygoesophageal recess node is concerning for metastatic disease. Partial extension of mass into the right paratracheal region.     Pertinent Vitals    SLP Plan  MBS    Recommendations Diet recommendations: NPO              Oral Care Recommendations: Oral care BID Plan: MBS    GO     Aram Domzalski, Katherene Ponto 03/27/2015, 12:56 PM

## 2015-03-27 NOTE — Progress Notes (Signed)
MBSS complete. Full report located under chart review in imaging section. Marquitta Persichetti, MA CCC-SLP [phone]  

## 2015-03-27 NOTE — Care Management Important Message (Signed)
Important Message  Patient Details  Name: JADER DESAI MRN: [id number] Date of Birth: [birth year]   Medicare Important Message Given:  Yes-second notification given    Shelda Altes 03/27/2015, 2:02 [place] Message  Patient Details  Name: KILO ESHELMAN MRN: [id number] Date of Birth: 24-Jun-1946   Medicare Important Message Given:  Yes-second notification given    Shelda Altes 03/27/2015, 2:02 PM

## 2015-03-28 ENCOUNTER — Inpatient Hospital Stay (HOSPITAL_COMMUNITY): Payer: Medicare HMO

## 2015-03-28 DIAGNOSIS — Z789 Other specified health status: Secondary | ICD-10-CM

## 2015-03-28 LAB — TYPE AND SCREEN
ABO/RH(D): O POS
Antibody Screen: POSITIVE
DAT, IgG: NEGATIVE
Unit division: 0

## 2015-03-28 LAB — BASIC METABOLIC PANEL
Anion gap: 11 (ref 5–15)
BUN: 37 mg/dL — AB (ref 6–20)
CALCIUM: 9.8 mg/dL (ref 8.9–10.3)
CHLORIDE: 102 mmol/L (ref 101–111)
CO2: 20 mmol/L — ABNORMAL LOW (ref 22–32)
Creatinine, Ser: 2.74 mg/dL — ABNORMAL HIGH (ref 0.61–1.24)
GFR calc non Af Amer: 22 mL/min — ABNORMAL LOW (ref >60)
GFR, EST AFRICAN AMERICAN: 26 mL/min — AB (ref >60)
Glucose, Bld: 87 mg/dL (ref 65–99)
Potassium: 5 mmol/L (ref 3.5–5.1)
Sodium: 133 mmol/L — ABNORMAL LOW (ref 135–145)

## 2015-03-28 MED ORDER — SODIUM CHLORIDE 0.9 % IV SOLN
INTRAVENOUS | Status: DC
  Administered 2015-03-28 – 2015-04-01 (×6): via INTRAVENOUS

## 2015-03-28 NOTE — Care Management (Signed)
Medicare Important Message given? YES  Date Medicare IM given: 03/28/15 Medicare IM given by: Venita Sheffield RN CCM

## 2015-03-28 NOTE — Consult Note (Signed)
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 PMT consulted for goals of care.  I met with his HCPOA [name] who has been with him to most of his oncology appointments- their are other family members in the room who complicate the discussion -they in general have poor insight into his prognosis and condition. High levels of provider mistrust- they want to know why they are just now finding out about the brain mets- they tell me it is treatable but not curable and want to know what teh treatment options are. They expect him to make a full recovery with treatment and regain his function- I attempted to reframe thier hope and paint a more realistic trajectory- I was met with high levels of skepticism - they are asking for a second opinion at [facility] or [facility].  I encouraged them to focus on making the time with him the best it could be and that I anticipated this cancer would indeed take his life.  Contacts/Participants in Discussion: Primary Decision Maker: Philomena Doheny: yes  Multiple other family members involved. Nicole Kindred his brother, a significant other [name] and several nieces and nephews  Code Status/Advance Care Planning:  FULL- I have not been able to address  Symptom Management:   PRN pain medication   Palliative Prophylaxis: dulcolax, eye lubricant  Additional Recommendations (Limitations, Scope, Preferences):  Need to take a stepwise approach and build trust wit this family    Psycho-social/Spiritual:   Support System: Strong  Desire for further Chaplaincy support:yes  Prognosis: < 6 months  Discharge Planning:  TBD, Skilled facility- may or may not agree to hospice       Chief Complaint/History of Present Illness: Weakness  Primary Diagnoses  Present on Admission:  . Pneumonia . Essential hypertension . Lung cancer, main bronchus  Palliative Review of Systems:  I have reviewed the medical record, interviewed the patient and family, and examined the patient. The following aspects are pertinent.  Past Medical History  Diagnosis Date  . Syncope   . Dehydration   . Blind left eye   . Hypertension   . Anemia     "low Blood"  . Dementia     poor memory  . Lung cancer 08/12/14    Right lung   History   Social History  . Marital Status: Single    Spouse Name: N/A  . Number of Children: N/A  . Years of Education: N/A   Social History Main Topics  . Smoking status: Former Smoker -- 1.00 packs/day for 38 years    Quit date: 06/24/2014  . Smokeless tobacco: Never Used  . Alcohol Use: No  . Drug Use: No  . Sexual Activity: No   Other Topics Concern  . None   Social History Narrative   Patient lives with his nephew at home for the past year to year and a half. Patient's nephew is her healthcare power of attorney   Patient has a sister who is also involved in the patient's care   Family History  Problem Relation Age of Onset  . Hypertension Brother    Scheduled Meds: . amLODipine  10 mg Oral Daily  . antiseptic oral rinse  7 mL Mouth Rinse q12n4p  . budesonide-formoterol  2 puff Inhalation BID  . chlorhexidine  15 mL Mouth Rinse BID  . dexamethasone  10 mg Intravenous Daily  . heparin  5,000 Units Subcutaneous 3 times per day  . piperacillin-tazobactam (ZOSYN)  IV  3.375 g Intravenous Q8H   Continuous Infusions:  PRN Meds:.ipratropium-albuterol, LORazepam, promethazine, senna-docusate Medications Prior to Admission:  Prior  to Admission medications   Medication Sig Start Date End Date Taking? Authorizing Provider  amLODipine (NORVASC) 10 MG tablet Take 1 tablet (10 mg total) by mouth daily. 10/14/14  Yes Susanne Borders, NP  budesonide-formoterol (SYMBICORT) 160-4.5 MCG/ACT inhaler Inhale 2 puffs into the lungs 2 (two) times daily. 10/14/14  Yes Susanne Borders, NP  Dextrose-Sodium Chloride (DEXTROSE 5 % AND 0.45 % NACL) 5-0.45 % Inject 100 mL/hr into the vein continuous.   Yes Historical Provider, MD  ipratropium-albuterol (DUONEB) 0.5-2.5 (3) MG/3ML SOLN Take 3 mLs by nebulization every 6 (six) hours. 08/09/14  Yes Thurnell Lose, MD  Multiple Vitamins-Minerals (CENTROVITE) TABS TAKE 1 TABLET BY MOUTH ONCE DAILY 02/27/15  Yes Curt Bears, MD  nystatin (MYCOSTATIN) 100000 UNIT/ML suspension Take 5 mLs by mouth 4 (four) times daily. For 5 days   Yes Historical Provider, MD  potassium chloride 20 MEQ TBCR Take 20 mEq by mouth daily. 03/17/15  Yes Theodis Blaze, MD  Probiotic Product (PROBIOTIC DAILY PO) Take 1 tablet by mouth daily.   Yes Historical Provider, MD  prochlorperazine (COMPAZINE) 10 MG tablet TAKE 1 TABLET EVERY 6 HOURS AS NEEDED FOR NAUSEA OR VOMITING 02/27/15  Yes Curt Bears, MD  acetaminophen (TYLENOL) 325 MG tablet Take 325-650 mg by mouth every 6 (six) hours as needed for moderate pain or fever.    Historical Provider, MD  albuterol (PROVENTIL) (2.5 MG/3ML) 0.083% nebulizer solution Take 3 mLs (2.5 mg total) by nebulization every 4 (four) hours as needed for wheezing or shortness of breath. 03/17/15   Theodis Blaze, MD  amoxicillin-clavulanate (AUGMENTIN) 500-125 MG per tablet Take 1 tablet (500 mg total) by mouth 3 (three) times daily. 03/17/15   Theodis Blaze, MD  guaiFENesin (ROBITUSSIN) 100 MG/5ML liquid Take 200 mg by mouth 3 (three) times daily as needed for cough.    Historical Provider, MD  oxyCODONE (OXY IR/ROXICODONE) 5 MG immediate release tablet Take 1 tablet (5 mg total) by mouth every 4  (four) hours as needed for moderate pain. 03/17/15   Theodis Blaze, MD   No Known Allergies CBC:    Component Value Date/Time   WBC 9.7 03/26/2015 0426   WBC 2.6* 01/20/2015 1019   HGB 10.4* 03/26/2015 0426   HGB 11.6* 01/20/2015 1019   HCT 30.7* 03/26/2015 0426   HCT 35.1* 01/20/2015 1019   PLT 210 03/26/2015 0426   PLT 82* 01/20/2015 1019   MCV 92.5 03/26/2015 0426   MCV 101.2* 01/20/2015 1019   NEUTROABS 7.9* 03/26/2015 0426   NEUTROABS 1.7 01/20/2015 1019   LYMPHSABS 1.0 03/26/2015 0426   LYMPHSABS 0.4* 01/20/2015 1019   MONOABS 0.7 03/26/2015 0426   MONOABS 0.4 01/20/2015 1019   EOSABS 0.0 03/26/2015 0426   EOSABS 0.0 01/20/2015 1019   BASOSABS 0.1 03/26/2015 0426   BASOSABS 0.0 01/20/2015 1019   Comprehensive Metabolic Panel:    Component Value Date/Time   NA  133* 03/28/2015 0540   NA 138 01/20/2015 1020   K 5.0 03/28/2015 0540   K 3.9 01/20/2015 1020   CL 102 03/28/2015 0540   CL 105 07/20/2012 1327   CO2 20* 03/28/2015 0540   CO2 24 01/20/2015 1020   BUN 37* 03/28/2015 0540   BUN 16.4 01/20/2015 1020   CREATININE 2.74* 03/28/2015 0540   CREATININE 1.3 01/20/2015 1020   GLUCOSE 87 03/28/2015 0540   GLUCOSE 114 01/20/2015 1020   GLUCOSE 99 07/20/2012 1327   CALCIUM 9.8 03/28/2015 0540   CALCIUM 9.6 01/20/2015 1020   AST 17 03/26/2015 0426   AST 25 01/20/2015 1020   ALT 9* 03/26/2015 0426   ALT 57* 01/20/2015 1020   ALKPHOS 43 03/26/2015 0426   ALKPHOS 61 01/20/2015 1020   BILITOT 0.9 03/26/2015 0426   BILITOT 0.29 01/20/2015 1020   PROT 8.3* 03/26/2015 0426   PROT 8.2 01/20/2015 1020   ALBUMIN 2.4* 03/26/2015 0426   ALBUMIN 2.6* 01/20/2015 1020    Physical Exam: Vital Signs: BP 141/82 mmHg  Pulse 66  Temp(Src) 97.7 F (36.5 C) (Oral)  Resp 19  Ht _0  (1.727 m)  Wt 68.6 kg (151 lb 3.8 oz)  BMI 23.00 kg/m2  SpO2 99% SpO2: SpO2: 99 % O2 Device: O2 Device: Not Delivered O2 Flow Rate:   Intake/output summary:  Intake/Output Summary (Last 24  hours) at 03/28/15 1248 Last data filed at 03/28/15 0807  Gross per 24 hour  Intake    940 ml  Output   1075 ml  Net   -135 ml   LBM: Last BM Date:  (PTA) Baseline Weight: Weight: 68.6 kg (151 lb 3.8 oz) Most recent weight: Weight: 68.6 kg (151 lb 3.8 oz)  Exam Findings:           Palliative Performance Scale: 30               Additional Data Reviewed: Recent Labs     03/26/15  0426  03/28/15  0540  WBC  9.7   --   HGB  10.4*   --   PLT  210   --   NA  133*  133*  BUN  16  37*  CREATININE  1.53*  2.74*     Time In: 11:30 Time Out: 1:30 Time Total: 120 minutes Greater than 50%  of this time was spent counseling and coordinating care related to the above assessment and plan.  Signed by: Roma Schanz, DO  03/28/2015, 12:48 PM  Please contact Palliative Medicine Team phone at [phone] for questions and concerns.

## 2015-03-28 NOTE — Progress Notes (Signed)
PT Cancellation Note  Patient Details Name: CUINN WESTERHOLD MRN: [id number] DOB: 09-Apr-1946   Cancelled Treatment:    Reason Eval/Treat Not Completed: Patient declined, no reason specified (pt refused to get OOB, stated his legs hurt but refused pain medication. He agreed to PT coming tomorrow. Will follow. ) Discussed risks of immobility, pt continued to refuse to get OOB.    Blondell Reveal Kistler 03/28/2015, 9:13 AM [phone]

## 2015-03-28 NOTE — Progress Notes (Signed)
[facility] PROGRESS NOTE  Stephen Ellis:[id number] DOB: 05-21-1946 DOA: 03/24/2015 PCP: Elizabeth Palau, MD  HPI/Subjective: 69 year old male with lung cancer with neuroendocrine diff, plasma cell dyscrasia, HTN, dementia and anemia presented to the ED on 03/24/15 for vomiting. He has been treated multiple times in the past few months for aspiration and post-obstructive pneumonia. His most recent treatments of chemotherapy and radiation for lung cancer were in May 2016.  Patient is non-verbal and per family he has had a wet cough x 3 weeks, has been vomiting the past few days and has been non-verbal for the past 3-4 weeks with no known falls. Speech therapy has been involved.   Today, the patient reports feeling well overall with no pain or discomfort.  At systems is limited as patient is aphonic    Assessment/Plan: 1. Pneumonia, aspiration versus postobstructive - Patient is afebrile currently no tachycardia, tachypnea or elevated WBC although does have cancer history  - Has been afebrile since 03/25/15 - On Zosyn, dosing per pharmacy, discontinue Vanc 03/27/15 - Chest CT: Consistent with spread of cancer although patient does have a history of postobstructive and aspiration pneumonia - Blood cultures pending 7/24 sets performed and all are pending - Symbicort BID, duoneb PRN -Palliative care to assist with discussions regarding feeding versus artificial or parenteral feeds, although I do not think either is a good option long-term -Although he's been cleared by speech therapy for diet he is coughing visibly today and I think he is aspirating so I've kept him nothing by mouth on 7/23 -I mentioned to the son who is at the bedside that his prognosis is very guarded and that family need to sit down together with palliative care and get affairs in order as I do not think that offering PEG tube placement or artificial feeding will make discernible difference to his quality of life.  The family expressed understanding as the patient's daughter-in-law has a mother who passed away from aspiration  2. Hypertension - Stable - On amlodipine 10 mg daily  3. Metastatic Lung Cancer, Right hilum-currently stage IV disease given new metastases - Being following by radiation oncology -Has recently completed XRT  -CT scan of the head done 7/20 = metastatic 2 cm round mass in the left parietal region  -patient has previously declined systemic chemotherapy in May 2015 - Phenergran PRN for nausea  - Decadron to reduce swelling in brain resulting from met and ativan PRN for an seizure-like activity - Palliative care input greatly appreciated  4. Aphonia - secondary to 2 X3 centimeter metastatic spread to left parietal region of the brain - Per brother and nephew, change in baseline three-four weeksprior to admission - Generalized right sided weakness, more weakness in right upper extremity than right lower extremity, though weakness has improved considerably from admission - SLP consulted, reevaluated on 03/27/15 and patient passed bedside swallow - Modified barium cleared the patient for a diet with thin liquids  5. Prior plasma cell dyscrasia/myeloma - Defer to oncology  6. Acute Kidney Injury  - BUN /creatinine steadily worsening  -Started IV saline 75 cc per hour 7/23 until goals of care can be finalized  7. Reported dementia - Patient was alert and interacted appropriately today, though non-verbal -Patient not on any home meds -Re-orient and monitor    Code Status: Full Family Communication: Patient and family Disposition Plan: Remain inpatient  Consultants:  Palliative  SLP  Antibiotics:  Vanc 03/24/15 >> 03/27/15  Zosyn 03/24/15 >>  Objective: Filed Vitals:  03/28/15 1307  BP: 144/99  Pulse: 69  Temp: 97.8 F (36.6 C)  Resp: 18    Intake/Output Summary (Last 24 hours) at 03/28/15 1807 Last data filed at 03/28/15 1316  Gross per 24 hour   Intake   1060 ml  Output   1525 ml  Net   -465 ml   Filed Weights   03/25/15 0625  Weight: 68.6 kg (151 lb 3.8 oz)    Exam:   General: More awake alert nodding head seems to understand discussion going on about about him-visibly diaphoretic some mouth twisting  Cardiovascular: RRR, no m/r/g, no LE edema, 2+ DP pulses  Respiratory: Clear to ausculation, some upper respiratory congestion heard, occasional cough, normal respiratory effort     Data Reviewed: Basic Metabolic Panel:  Recent Labs Lab 03/24/15 2336 03/25/15 0758 03/26/15 0426 03/28/15 0540  NA 130*  --  133* 133*  K 3.8  --  3.5 5.0  CL 97*  --  99* 102  CO2 27  --  23 20*  GLUCOSE 96  --  95 87  BUN 8  --  16 37*  CREATININE 1.14 1.16 1.53* 2.74*  CALCIUM 10.0  --  9.5 9.8   Liver Function Tests:  Recent Labs Lab 03/24/15 2336 03/26/15 0426  AST 22 17  ALT 13* 9*  ALKPHOS 55 43  BILITOT 0.8 0.9  PROT 10.1* 8.3*  ALBUMIN 3.0* 2.4*    Recent Labs Lab 03/24/15 2336  LIPASE 28   CBC:  Recent Labs Lab 03/24/15 2336 03/25/15 0758 03/26/15 0426  WBC 8.2 8.8 9.7  NEUTROABS  --   --  7.9*  HGB 12.1* 10.4* 10.4*  HCT 36.9* 31.7* 30.7*  MCV 94.1 94.3 92.5  PLT 235 208 210    Recent Results (from the past 240 hour(s))  Blood culture (routine x 2)     Status: None (Preliminary result)   Collection Time: 03/24/15 11:51 PM  Result Value Ref Range Status   Specimen Description BLOOD LEFT FOREARM  Final   Special Requests BOTTLES DRAWN AEROBIC AND ANAEROBIC  Final   Culture   Final    NO GROWTH 3 DAYS Performed at [facility]    Report Status PENDING  Incomplete  Blood culture (routine x 2)     Status: None (Preliminary result)   Collection Time: 03/25/15 12:00 AM  Result Value Ref Range Status   Specimen Description BLOOD LEFT HAND  Final   Special Requests BOTTLES DRAWN AEROBIC ONLY  Final   Culture   Final    NO GROWTH 3 DAYS Performed at [facility]     Report Status PENDING  Incomplete  MRSA PCR Screening     Status: None   Collection Time: 03/25/15  8:56 AM  Result Value Ref Range Status   MRSA by PCR NEGATIVE NEGATIVE Final    Comment:        The GeneXpert MRSA Assay (FDA approved for NASAL specimens only), is one component of a comprehensive MRSA colonization surveillance program. It is not intended to diagnose MRSA infection nor to guide or monitor treatment for MRSA infections.   Culture, blood (routine x 2)     Status: None (Preliminary result)   Collection Time: 03/25/15  2:25 PM  Result Value Ref Range Status   Specimen Description BLOOD LEFT HAND  Final   Special Requests BOTTLES DRAWN AEROBIC AND ANAEROBIC  10CC  Final   Culture   Final  NO GROWTH 3 DAYS Performed at [facility]    Report Status PENDING  Incomplete  Culture, blood (routine x 2)     Status: None (Preliminary result)   Collection Time: 03/25/15  2:30 PM  Result Value Ref Range Status   Specimen Description BLOOD RIGHT ANTECUBITAL  Final   Special Requests BOTTLES DRAWN AEROBIC AND ANAEROBIC  10CC  Final   Culture   Final    NO GROWTH 3 DAYS Performed at [facility]    Report Status PENDING  Incomplete   Studies: Dg Swallowing Func-speech Pathology  03/27/2015    Objective Swallowing Evaluation:    Patient Details  Name: Stephen Ellis MRN: [id number] Date of Birth: June 05, 1946  Today's Date: 03/27/2015 Time: SLP Start Time (ACUTE ONLY): 1350-SLP Stop Time (ACUTE ONLY): 1415 SLP Time Calculation (min) (ACUTE ONLY): 25 min  Past Medical History:  Past Medical History  Diagnosis Date  . Syncope   . Dehydration   . Blind left eye   . Hypertension   . Anemia     "low Blood"  . Dementia     poor memory  . Lung cancer 08/12/14    Right lung   Past Surgical History:  Past Surgical History  Procedure Laterality Date  . Video bronchoscopy Bilateral 07/29/2014    Procedure: VIDEO BRONCHOSCOPY WITHOUT FLUORO;  Surgeon: Tanda Rockers,  MD;  Location:  [facility] ENDOSCOPY;  Service: Cardiopulmonary;  Laterality:  Bilateral;  . Video bronchoscopy Bilateral 08/12/2014    Procedure: VIDEO BRONCHOSCOPY WITHOUT FLUORO;  Surgeon: Wilhelmina Mcardle,  MD;  Location: [facility]COPY;  Service: Cardiopulmonary;  Laterality:  Bilateral;   HPI:  Other Pertinent Information: Patient is a 69 year old man admitted after  vomiting dark material with concern for aspiration.  PMH + with history of  lung cancer status post chemotherapy in April and radiation that finished  in May with a history of postobstructive pneumonia and difficulty  swallowing who presents to the hospital with c/o increased difficulty  swallowing and lethargy. CXR shows residual or recurrent RLL PNA.  CT  chest indicated 1. No evidence of pulmonary embolus. 2. Recurrent patchy  opacities at the right lower lung lobe, with associated right pleural  effusion concerning for pna,  Mildly enlarged azygoesophageal recess node  is concerning for metastatic disease. Partial extension of mass into the  right paratracheal region.    No Data Recorded  Assessment / Plan / Recommendation CHL IP CLINICAL IMPRESSIONS 03/27/2015  Therapy Diagnosis Severe oral phase dysphagia;Moderate pharyngeal phase  dysphagia  Clinical Impression Pt demonstrates a moderate to severe oral dysphagia  with limited attempts to transit solid textures and slight lingual pumping  for liquid transit. Pt able to follow cues for slight posterior head tilt  to transit liquid boluses (though this was not necessary at bedside).  Swallow initiation is delayed but no penetration or aspiration occurred  with large or small boluses, though small boluses are certainly preferred  to reduce risk of penetration. A head tilt with a second swallow is also  helpful to transit oral residuals. Oropharyngeal mobility, strength and  structures were WNL under fluoroscopy. Recommend full liquid texture with  ongoing puree trials with therapy to advance diet. Pt would not manipulate   whole pill and struggled with puree texture, so pills crushed in liquids  or maybe ice cream would be worth trying. Given severity of deficits,  adequate, consistent PO and medication consumption will be difficult.  CHL IP TREATMENT RECOMMENDATION 03/27/2015  Treatment Recommendations Therapy as outlined in treatment plan below     CHL IP DIET RECOMMENDATION 03/27/2015  SLP Diet Recommendations Thin  Liquid Administration via (None)  Medication Administration Crushed with puree  Compensations Slow rate;Small sips/bites;Check for anterior loss  Postural Changes and/or Swallow Maneuvers (None)     CHL IP OTHER RECOMMENDATIONS 03/27/2015  Recommended Consults (None)  Oral Care Recommendations Oral care BID  Other Recommendations Have oral suction available     CHL IP FOLLOW UP RECOMMENDATIONS 03/16/2015  Follow up Recommendations Other (comment)     CHL IP FREQUENCY AND DURATION 03/27/2015  Speech Therapy Frequency (ACUTE ONLY) min 2x/week  Treatment Duration 2 weeks     Pertinent Vitals/Pain NA    SLP Swallow Goals No flowsheet data found.  No flowsheet data found.    CHL IP REASON FOR REFERRAL 03/27/2015  Reason for Referral Objectively evaluate swallowing function             No flowsheet data found.  No flowsheet data found.        Herbie [place], [place] CCC-SLP [phone]  DeBlois, Katherene Ponto 03/27/2015, 2:30 PM     Scheduled Meds: . amLODipine  10 mg Oral Daily  . antiseptic oral rinse  7 mL Mouth Rinse q12n4p  . budesonide-formoterol  2 puff Inhalation BID  . chlorhexidine  15 mL Mouth Rinse BID  . dexamethasone  10 mg Intravenous Daily  . heparin  5,000 Units Subcutaneous 3 times per day  . piperacillin-tazobactam (ZOSYN)  IV  3.375 g Intravenous Q8H   Active Problems:   Essential hypertension   Pneumonia   Lung cancer, main bronchus   Aphonia  Time spent: 62 Verneita Griffes, MD [facility] [phone]

## 2015-03-29 LAB — CBC
HEMATOCRIT: 33.7 % — AB (ref 39.0–52.0)
Hemoglobin: 11.2 g/dL — ABNORMAL LOW (ref 13.0–17.0)
MCH: 30.8 pg (ref 26.0–34.0)
MCHC: 33.2 g/dL (ref 30.0–36.0)
MCV: 92.6 fL (ref 78.0–100.0)
Platelets: 215 10*3/uL (ref 150–400)
RBC: 3.64 MIL/uL — ABNORMAL LOW (ref 4.22–5.81)
RDW: 12.5 % (ref 11.5–15.5)
WBC: 3.5 10*3/uL — AB (ref 4.0–10.5)

## 2015-03-29 LAB — BASIC METABOLIC PANEL
Anion gap: 8 (ref 5–15)
BUN: 30 mg/dL — AB (ref 6–20)
CALCIUM: 10.1 mg/dL (ref 8.9–10.3)
CO2: 27 mmol/L (ref 22–32)
Chloride: 104 mmol/L (ref 101–111)
Creatinine, Ser: 2.25 mg/dL — ABNORMAL HIGH (ref 0.61–1.24)
GFR calc Af Amer: 33 mL/min — ABNORMAL LOW (ref >60)
GFR, EST NON AFRICAN AMERICAN: 28 mL/min — AB (ref >60)
GLUCOSE: 84 mg/dL (ref 65–99)
Potassium: 3.9 mmol/L (ref 3.5–5.1)
Sodium: 139 mmol/L (ref 135–145)

## 2015-03-29 NOTE — Progress Notes (Signed)
Patient continues to decline- he is extremely weak and deconditioned.  Family has very poor insight into his disease and prognosis.  1. Unable to address code status- high levels of provider mistrust 2. They are requesting to speak with Dr. Earlie Server about "treatment options" 3. They are requesting a second opinion from an academic institution 4. I encouraged them to focus on quality time with their loved one and making his QOl the best it can be with the time he has left. 5. I went over all of the medical details of his case and showed them the large mass in his brain which they were unaware of on my visit yesterday.  Will be a process to help the family understand and consider more palliative options- they are also holding out for God to heal him and for a miracle- prior to my meeting they did not understand that he would not return back to his previous state of good health.  I will follow.  Stephen Hacker, DO Palliative Medicine [phone]

## 2015-03-29 NOTE — Progress Notes (Signed)
ANTIBIOTIC CONSULT NOTE - Follow up  Pharmacy Consult for Zosyn Indication: HCAP  No Known Allergies  Patient Measurements: Height: '5\' 8"'$  (172.7 cm) Weight: 151 lb 3.8 oz (68.6 kg) IBW/kg (Calculated) : 68.4  Vital Signs: Temp: 97.7 F (36.5 C) (07/24 0505) Temp Source: Axillary (07/24 0505) BP: 158/97 mmHg (07/24 0505) Pulse Rate: 74 (07/24 0505) Intake/Output from previous day: 07/23 0701 - 07/24 0700 In: 1313.8 [P.O.:600; I.V.:563.8; IV Piggyback:150] Out: 1450 [Urine:1450] Intake/Output from this shift:    Labs:  Recent Labs  03/28/15 0540 03/29/15 0534  WBC  --  3.5*  HGB  --  11.2*  PLT  --  215  CREATININE 2.74* 2.25*   Estimated Creatinine Clearance: 30.8 mL/min (by C-G formula based on Cr of 2.25). No results for input(s): VANCOTROUGH, VANCOPEAK, VANCORANDOM, GENTTROUGH, GENTPEAK, GENTRANDOM, TOBRATROUGH, TOBRAPEAK, TOBRARND, AMIKACINPEAK, AMIKACINTROU, AMIKACIN in the last 72 hours.   Microbiology: Recent Results (from the past 720 hour(s))  Culture, blood (routine x 2)     Status: None   Collection Time: 03/12/15  6:00 PM  Result Value Ref Range Status   Specimen Description BLOOD LEFT ANTECUBITAL  Final   Special Requests BOTTLES DRAWN AEROBIC AND ANAEROBIC 5CC EACH  Final   Culture   Final    NO GROWTH 5 DAYS Performed at [facility]    Report Status 03/17/2015 FINAL  Final  Culture, blood (routine x 2)     Status: None   Collection Time: 03/12/15  6:30 PM  Result Value Ref Range Status   Specimen Description BLOOD RIGHT ANTECUBITAL  Final   Special Requests BOTTLES DRAWN AEROBIC AND ANAEROBIC 5ML  Final   Culture   Final    NO GROWTH 5 DAYS Performed at [facility]    Report Status 03/17/2015 FINAL  Final  Urine culture     Status: None   Collection Time: 03/12/15  8:24 PM  Result Value Ref Range Status   Specimen Description URINE, CATHETERIZED  Final   Special Requests NONE  Final   Culture   Final    NO GROWTH 2  DAYS Performed at [facility]    Report Status 03/14/2015 FINAL  Final  MRSA PCR Screening     Status: None   Collection Time: 03/13/15  1:35 AM  Result Value Ref Range Status   MRSA by PCR NEGATIVE NEGATIVE Final    Comment:        The GeneXpert MRSA Assay (FDA approved for NASAL specimens only), is one component of a comprehensive MRSA colonization surveillance program. It is not intended to diagnose MRSA infection nor to guide or monitor treatment for MRSA infections.   Culture, Urine     Status: None   Collection Time: 03/16/15  3:04 PM  Result Value Ref Range Status   Specimen Description URINE, RANDOM  Final   Special Requests NONE  Final   Culture   Final    NO GROWTH 2 DAYS Performed at [facility]    Report Status 03/18/2015 FINAL  Final  Blood culture (routine x 2)     Status: None (Preliminary result)   Collection Time: 03/24/15 11:51 PM  Result Value Ref Range Status   Specimen Description BLOOD LEFT FOREARM  Final   Special Requests BOTTLES DRAWN AEROBIC AND ANAEROBIC 5ML  Final   Culture   Final    NO GROWTH 3 DAYS Performed at [facility]    Report Status PENDING  Incomplete  Blood  culture (routine x 2)     Status: None (Preliminary result)   Collection Time: 03/25/15 12:00 AM  Result Value Ref Range Status   Specimen Description BLOOD LEFT HAND  Final   Special Requests BOTTLES DRAWN AEROBIC ONLY 4ML  Final   Culture   Final    NO GROWTH 3 DAYS Performed at [facility]    Report Status PENDING  Incomplete  MRSA PCR Screening     Status: None   Collection Time: 03/25/15  8:56 AM  Result Value Ref Range Status   MRSA by PCR NEGATIVE NEGATIVE Final    Comment:        The GeneXpert MRSA Assay (FDA approved for NASAL specimens only), is one component of a comprehensive MRSA colonization surveillance program. It is not intended to diagnose MRSA infection nor to guide or monitor treatment for MRSA infections.    Culture, blood (routine x 2)     Status: None (Preliminary result)   Collection Time: 03/25/15  2:25 PM  Result Value Ref Range Status   Specimen Description BLOOD LEFT HAND  Final   Special Requests BOTTLES DRAWN AEROBIC AND ANAEROBIC  10CC  Final   Culture   Final    NO GROWTH 3 DAYS Performed at [facility]    Report Status PENDING  Incomplete  Culture, blood (routine x 2)     Status: None (Preliminary result)   Collection Time: 03/25/15  2:30 PM  Result Value Ref Range Status   Specimen Description BLOOD RIGHT ANTECUBITAL  Final   Special Requests BOTTLES DRAWN AEROBIC AND ANAEROBIC  10CC  Final   Culture   Final    NO GROWTH 3 DAYS Performed at [facility]    Report Status PENDING  Incomplete   Assessment: 69 y.o. male with lung cancer treated with radiation admitted 03/24/2015 for pneumonia.  Recently admitted and treated for aspiration pneumonia earlier this month.  CT chest reveals recurrent RLL opacities concerning for PNA.  To begin vancomycin and Zosyn per pharmacy.  First doses given in ED.  Noted to have received vancomycin during Dec 2015 (was 10 kg heavier at that time)  7/20 >> Vanc >> 7/22 7/20 >> Zosyn >>    Tmax: AF WBC: down below nml range Renal: SCr jumped to 2.74, now 2.25 after increased IVF. CrCl 31CG, 32N 7/20 CXR w/ recurrent patchy opacities RLL  7/19 blood: ngtd 7/20 blood: ngtd MRSA nasal swab: neg  Goal of Therapy:  Vancomycin trough level 15-20 mcg/ml  Eradication of infection Appropriate antibiotic dosing for indication and renal function  Plan:  Cont Zosyn 3.375g IV given every 8 hrs by 4-hr infusion. Follow clinical course, renal function, culture results as available.  Romeo Rabon, PharmD, pager [phone]. 03/29/2015,7:12 AM.

## 2015-03-29 NOTE — Progress Notes (Signed)
Notified Dr. Hilbert Bible concerning patient refused heparin shot. She entered order for SCD's which patient has on and running.

## 2015-03-29 NOTE — Progress Notes (Addendum)
[facility] PROGRESS NOTE  HOUA ACKERT HQR:[id number] DOB: 1945-09-28 DOA: 03/24/2015 PCP: Elizabeth Palau, MD  HPI/Subjective: 69 year old male with lung cancer with neuroendocrine diff, plasma cell dyscrasia, HTN, dementia and anemia presented to the ED on 03/24/15 for vomiting. He has been treated multiple times in the past few months for aspiration and post-obstructive pneumonia. His most recent treatments of chemotherapy and radiation for lung cancer were in May 2016.  Patient is non-verbal and per family he has had a wet cough x 3 weeks, has been vomiting the past few days and has been non-verbal for the past 3-4 weeks with no known falls. Speech therapy has been involved.   Subjective Noncommunicative , nodding yes or no to simple questions  No family to give collateral information at present    Assessment/Plan: 1. Pneumonia, aspiration versus postobstructive - Patient is afebrile currently no tachycardia, tachypnea or elevated WBC although does have cancer history  - Has been afebrile since 03/25/15 - On Zosyn, dosing per pharmacy, discontinue Vanc 03/27/15 - Chest CT: Consistent with spread of cancer although patient does have a history of postobstructive and aspiration pneumonia - Blood cultures pending 7/24 sets performed and all are pending - Symbicort BID, duoneb PRN -Palliative care to assist with discussions regarding feeding versus artificial or parenteral feeds, although I do not think either is a good option long-term -Although he's been cleared by speech therapy for diet he has developed coughing and a new aspiration pneumonia on chest x-ray dated 7/23 -I mentioned to the son 7.23 at the bedside that his prognosis is very guarded -Family is still processing this per discussion with Dr. Hilma Favors today 7.24.16 -Await final decision making per Dr. Earlie Server 7/25 AM  2. Hypertension - Stable - On amlodipine 10 mg daily  3. Metastatic Lung Cancer, Right  hilum-currently stage IV disease given new metastases - Being following by radiation oncology -Has recently completed XRT  -CT scan of the head done 7/20 = metastatic 2 cm round mass in the left parietal region  -patient has previously declined systemic chemotherapy in May 2015 - Phenergran PRN for nausea  - Decadron to reduce swelling in brain resulting from met and ativan PRN for an seizure-like activity - Palliative care input greatly appreciated  4. Aphonia - secondary to 2 X3 centimeter metastatic spread to left parietal region of the brain - Per brother and nephew, change in baseline three-four weeksprior to admission - Generalized right sided weakness, more weakness in right upper extremity than right lower extremity, though weakness has improved considerably from admission - SLP consulted, reevaluated on 03/27/15 and patient passed bedside swallow -Would keep nothing by mouth as new pneumonia on 7/23  5. Prior plasma cell dyscrasia/myeloma - Defer to oncology  6. Acute Kidney Injury  - BUN /creatinine steadily worsening In the setting of poor by mouth intake and inadequate hydration -Started IV saline 75 cc per hour 7/23 until goals of care can be finalized  7. Reported dementia - Patient was alert and interacted appropriately today, though non-verbal -Patient not on any home meds -Re-orient and monitor    Code Status: Full Family Communication: Patient and family Disposition Plan: Remain inpatient  Consultants:  Palliative  SLP  Antibiotics:  Vanc 03/24/15 >> 03/27/15  Zosyn 03/24/15 >>  Objective: Filed Vitals:   03/29/15 1313  BP: 145/95  Pulse: 66  Temp: 97.6 F (36.4 C)  Resp: 18    Intake/Output Summary (Last 24 hours) at 03/29/15 1633 Last data filed at  03/29/15 1315  Gross per 24 hour  Intake 713.75 ml  Output   1450 ml  Net -736.25 ml   Filed Weights   03/25/15 0625  Weight: 68.6 kg (151 lb 3.8 oz)    Exam:   General: More awake  alert nodding head seems to understand discussion going on about about him-visibly diaphoretic some mouth twisting  Cardiovascular: RRR, no m/r/g, no LE edema, 2+ DP pulses  Respiratory: Clear to ausculation, some upper respiratory congestion heard, occasional cough, normal respiratory effort     Data Reviewed: Basic Metabolic Panel:  Recent Labs Lab 03/24/15 2336 03/25/15 0758 03/26/15 0426 03/28/15 0540 03/29/15 0534  NA 130*  --  133* 133* 139  K 3.8  --  3.5 5.0 3.9  CL 97*  --  99* 102 104  CO2 27  --  23 20* 27  GLUCOSE 96  --  95 87 84  BUN 8  --  16 37* 30*  CREATININE 1.14 1.16 1.53* 2.74* 2.25*  CALCIUM 10.0  --  9.5 9.8 10.1   Liver Function Tests:  Recent Labs Lab 03/24/15 2336 03/26/15 0426  AST 22 17  ALT 13* 9*  ALKPHOS 55 43  BILITOT 0.8 0.9  PROT 10.1* 8.3*  ALBUMIN 3.0* 2.4*    Recent Labs Lab 03/24/15 2336  LIPASE 28   CBC:  Recent Labs Lab 03/24/15 2336 03/25/15 0758 03/26/15 0426 03/29/15 0534  WBC 8.2 8.8 9.7 3.5*  NEUTROABS  --   --  7.9*  --   HGB 12.1* 10.4* 10.4* 11.2*  HCT 36.9* 31.7* 30.7* 33.7*  MCV 94.1 94.3 92.5 92.6  PLT 235 208 210 215    Recent Results (from the past 240 hour(s))  Blood culture (routine x 2)     Status: None (Preliminary result)   Collection Time: 03/24/15 11:51 PM  Result Value Ref Range Status   Specimen Description BLOOD LEFT FOREARM  Final   Special Requests BOTTLES DRAWN AEROBIC AND ANAEROBIC 5ML  Final   Culture   Final    NO GROWTH 4 DAYS Performed at [facility]    Report Status PENDING  Incomplete  Blood culture (routine x 2)     Status: None (Preliminary result)   Collection Time: 03/25/15 12:00 AM  Result Value Ref Range Status   Specimen Description BLOOD LEFT HAND  Final   Special Requests BOTTLES DRAWN AEROBIC ONLY 4ML  Final   Culture   Final    NO GROWTH 4 DAYS Performed at [facility]    Report Status PENDING  Incomplete  MRSA PCR Screening     Status:  None   Collection Time: 03/25/15  8:56 AM  Result Value Ref Range Status   MRSA by PCR NEGATIVE NEGATIVE Final    Comment:        The GeneXpert MRSA Assay (FDA approved for NASAL specimens only), is one component of a comprehensive MRSA colonization surveillance program. It is not intended to diagnose MRSA infection nor to guide or monitor treatment for MRSA infections.   Culture, blood (routine x 2)     Status: None (Preliminary result)   Collection Time: 03/25/15  2:25 PM  Result Value Ref Range Status   Specimen Description BLOOD LEFT HAND  Final   Special Requests BOTTLES DRAWN AEROBIC AND ANAEROBIC  10CC  Final   Culture   Final    NO GROWTH 4 DAYS Performed at [facility]    Report Status PENDING  Incomplete  Culture, blood (routine x 2)     Status: None (Preliminary result)   Collection Time: 03/25/15  2:30 PM  Result Value Ref Range Status   Specimen Description BLOOD RIGHT ANTECUBITAL  Final   Special Requests BOTTLES DRAWN AEROBIC AND ANAEROBIC  10CC  Final   Culture   Final    NO GROWTH 4 DAYS Performed at [facility]    Report Status PENDING  Incomplete   Studies: Dg Chest Port 1 View  03/28/2015   CLINICAL DATA:  Productive cough and rows. Possible aspiration pneumonia. History of right lung cancer.  EXAM: PORTABLE CHEST - 1 VIEW  COMPARISON:  Chest radiographs and CT 03/25/2015  FINDINGS: Cardiomediastinal silhouette is unchanged with persistent medial right upper lobe suprahilar soft tissue mass narrowing the right mainstem bronchus, better demonstrated on recent CT. There is persistent patchy opacity in the right lower lobe, with new mild opacity in the left lung base. No sizable pleural effusion or pneumothorax is identified. Residual contrast is noted in the splenic flexure of the colon.  IMPRESSION: 1. Patchy bibasilar opacities, new on the left and suspicious for pneumonia or aspiration. 2. Unchanged right upper lobe mass.   Electronically  Signed   By: Logan Bores   On: 03/28/2015 19:01    Scheduled Meds: . amLODipine  10 mg Oral Daily  . antiseptic oral rinse  7 mL Mouth Rinse q12n4p  . budesonide-formoterol  2 puff Inhalation BID  . chlorhexidine  15 mL Mouth Rinse BID  . dexamethasone  10 mg Intravenous Daily  . heparin  5,000 Units Subcutaneous 3 times per day  . piperacillin-tazobactam (ZOSYN)  IV  3.375 g Intravenous Q8H   Active Problems:   Essential hypertension   Pneumonia   Lung cancer, main bronchus   Aphonia  Time spent: 61 Verneita Griffes, MD [facility] [phone]

## 2015-03-29 NOTE — Evaluation (Signed)
Physical Therapy Evaluation Patient Details Name: Stephen Ellis MRN: [id number] DOB: [birth year] Today's Date: 03/29/2015   History of Present Illness  69 year old male with lung cancer with neuroendocrine diff, plasma cell dyscrasia, HTN, dementia and anemia presented to the ED 03/24/15  for vomiting. Patient is non-verbal, history provided by the ED notes. He presented from [place] for vomiting dark material and not feeling well. He is being treated for pneumonia and is currently taking amoxicillin. He was also recently treated for pneumonia multiples time in the hospital.   Clinical Impression  Patient is unable to speak, 2 person assist for mobility. Uncertain of PT indication as patient is followed by Palliative  Medicine. Will continue PT to address problems listed below.    Follow Up Recommendations SNF    Equipment Recommendations  None recommended by PT    Recommendations for Other Services       Precautions / Restrictions Precautions Precautions: Fall Precaution Comments: nonverbal      Mobility  Bed Mobility Overal bed mobility: Needs Assistance Bed Mobility: Supine to Sit     Supine to sit: Mod assist;HOB elevated     General bed mobility comments: Assist for trunk and bil LEs. Increased time. Utilized bedpad for scooting, positioning. Multimodal cues for technique  Transfers Overall transfer level: Needs assistance Equipment used: 2 person hand held assist Transfers: Sit to/from Stand;Stand Pivot Transfers Sit to Stand: From elevated surface;Min assist;+2 physical assistance;+2 safety/equipment Stand pivot transfers: Min assist;Mod assist;+2 physical assistance;+2 safety/equipment       General transfer comment: Assist to rise, stabilize, control descent. Multimodal cues for safety, technique, hand placement. patient able to reach to armrest of chai and support  to pivot to recliner.  Ambulation/Gait                Stairs             Wheelchair Mobility    Modified Rankin (Stroke Patients Only)       Balance Overall balance assessment: Needs assistance Sitting-balance support: Bilateral upper extremity supported;Feet supported Sitting balance-Leahy Scale: Fair                                       Pertinent Vitals/Pain Pain Assessment: No/denies pain    Home Living Family/patient expects to be discharged to:: Skilled nursing facility                 Additional Comments: unable to speak, appears to follow and understand    Prior Function Level of Independence: Needs assistance   Gait / Transfers Assistance Needed: per son, does not walk           Hand Dominance        Extremity/Trunk Assessment   Upper Extremity Assessment: Generalized weakness           Lower Extremity Assessment: Generalized weakness RLE Deficits / Details: ataxic moevement LLE Deficits / Details: ataxic movement     Communication   Communication: Expressive difficulties  Cognition Arousal/Alertness: Awake/alert   Overall Cognitive Status: Difficult to assess         Following Commands: Follows one step commands inconsistently     Problem Solving: Slow processing;Decreased initiation;Difficulty sequencing;Requires verbal cues;Requires tactile cues General Comments: limited verbal communication    General Comments      Exercises        Assessment/Plan    PT Assessment Patient  needs continued PT services  PT Diagnosis Generalized weakness   PT Problem List Decreased strength;Decreased activity tolerance;Decreased mobility;Decreased coordination;Decreased balance  PT Treatment Interventions Functional mobility training;Therapeutic activities;Gait training;Patient/family education   PT Goals (Current goals can be found in the Care Plan section) Acute Rehab PT Goals Patient Stated Goal: son states patient is going to Palliative care. PT Goal Formulation: With family Time For  Goal Achievement: 04/12/15 Potential to Achieve Goals: Fair    Frequency Min 2X/week   Barriers to discharge        Co-evaluation               End of Session Equipment Utilized During Treatment: Gait belt Activity Tolerance: Patient tolerated treatment well Patient left: in chair;with call bell/phone within reach;with chair alarm set;with family/visitor present Nurse Communication: Mobility status         Time: 1308-6578 PT Time Calculation (min) (ACUTE ONLY): 18 min   Charges:   PT Evaluation $Initial PT Evaluation Tier I: 1 Procedure     PT G CodesClaretha Cooper 03/29/2015, 1:27 PM Tresa Endo PT [phone]

## 2015-03-30 ENCOUNTER — Telehealth: Payer: Self-pay | Admitting: Internal Medicine

## 2015-03-30 ENCOUNTER — Telehealth: Payer: Self-pay | Admitting: *Deleted

## 2015-03-30 ENCOUNTER — Ambulatory Visit: Payer: Medicare HMO | Admitting: Internal Medicine

## 2015-03-30 DIAGNOSIS — C7B8 Other secondary neuroendocrine tumors: Secondary | ICD-10-CM

## 2015-03-30 DIAGNOSIS — Z515 Encounter for palliative care: Secondary | ICD-10-CM

## 2015-03-30 DIAGNOSIS — R1310 Dysphagia, unspecified: Secondary | ICD-10-CM

## 2015-03-30 DIAGNOSIS — C7931 Secondary malignant neoplasm of brain: Secondary | ICD-10-CM | POA: Diagnosis present

## 2015-03-30 DIAGNOSIS — J189 Pneumonia, unspecified organism: Secondary | ICD-10-CM

## 2015-03-30 DIAGNOSIS — C7A8 Other malignant neuroendocrine tumors: Secondary | ICD-10-CM

## 2015-03-30 LAB — CBC
HCT: 31.5 % — ABNORMAL LOW (ref 39.0–52.0)
Hemoglobin: 10.6 g/dL — ABNORMAL LOW (ref 13.0–17.0)
MCH: 31.3 pg (ref 26.0–34.0)
MCHC: 33.7 g/dL (ref 30.0–36.0)
MCV: 92.9 fL (ref 78.0–100.0)
Platelets: 229 10*3/uL (ref 150–400)
RBC: 3.39 MIL/uL — AB (ref 4.22–5.81)
RDW: 12.7 % (ref 11.5–15.5)
WBC: 4.9 10*3/uL (ref 4.0–10.5)

## 2015-03-30 LAB — CULTURE, BLOOD (ROUTINE X 2)
CULTURE: NO GROWTH
Culture: NO GROWTH
Culture: NO GROWTH
Culture: NO GROWTH

## 2015-03-30 LAB — COMPREHENSIVE METABOLIC PANEL
ALK PHOS: 34 U/L — AB (ref 38–126)
ALT: 12 U/L — ABNORMAL LOW (ref 17–63)
AST: 20 U/L (ref 15–41)
Albumin: 2.5 g/dL — ABNORMAL LOW (ref 3.5–5.0)
Anion gap: 9 (ref 5–15)
BUN: 26 mg/dL — ABNORMAL HIGH (ref 6–20)
CHLORIDE: 106 mmol/L (ref 101–111)
CO2: 21 mmol/L — ABNORMAL LOW (ref 22–32)
Calcium: 9.6 mg/dL (ref 8.9–10.3)
Creatinine, Ser: 1.84 mg/dL — ABNORMAL HIGH (ref 0.61–1.24)
GFR, EST AFRICAN AMERICAN: 42 mL/min — AB (ref >60)
GFR, EST NON AFRICAN AMERICAN: 36 mL/min — AB (ref >60)
Glucose, Bld: 77 mg/dL (ref 65–99)
POTASSIUM: 4 mmol/L (ref 3.5–5.1)
Sodium: 136 mmol/L (ref 135–145)
Total Bilirubin: 0.6 mg/dL (ref 0.3–1.2)
Total Protein: 8.3 g/dL — ABNORMAL HIGH (ref 6.5–8.1)

## 2015-03-30 MED ORDER — DRONABINOL 2.5 MG PO CAPS
2.5000 mg | ORAL_CAPSULE | Freq: Two times a day (BID) | ORAL | Status: DC
  Administered 2015-03-30 – 2015-04-03 (×8): 2.5 mg via ORAL
  Filled 2015-03-30 (×8): qty 1

## 2015-03-30 MED ORDER — DEXAMETHASONE SODIUM PHOSPHATE 10 MG/ML IJ SOLN
5.0000 mg | Freq: Every day | INTRAMUSCULAR | Status: DC
  Administered 2015-03-31 – 2015-04-01 (×2): 5 mg via INTRAVENOUS
  Filled 2015-03-30 (×2): qty 0.5

## 2015-03-30 NOTE — Progress Notes (Signed)
DIAGNOSIS:  1. 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 2) plasma cell dyscrasia diagnosed in 2013.  PRIOR THERAPY:  1) Status post palliative radiotherapy to the right lung obstructing mass under the care of Dr. Pablo Ledger completed on 09/19/2014. 2) Systemic chemotherapy with carboplatin for AUC of 5 and paclitaxel 175 MG/M2 every 3 weeks with Neulasta support. First cycle on 09/23/2014. Status post 5 cycles, last dose was given 12/16/2014. Discontinued based on the patient and his nephew's request. 3) palliative radiotherapy to the right upper lung under the care of Dr. Tammi Klippel completed 01/22/2015.  CURRENT THERAPY: None.  Subjective: The patient is seen and examined today. His brother and his nephew were at the bedside. The patient had frequent admission over the last 2 months with recurrent pneumonia. He was on systemic chemotherapy was carboplatin and paclitaxel but this was discontinued based on the patient and his nephew's request. He was found recently on CT scan of the head to have metastatic brain lesion. CT scan of the chest also showed evidence for disease progression as well as recurrent pneumonia. He was started on treatment with Zosyn and vancomycin. The patient has significant fatigue and weakness and he is bedridden with very little activity. He is unable to carry a reasonable conversation. His family are a little bit unrealistic and looking for aggressive treatment but not including systemic chemotherapy. They are thinking about further radiotherapy as well as holistic medicine. He was seen by the palliative care team but the family are not accepting hospice care.  Objective: Vital signs in last 24 hours: Temp:  [98.1 F (36.7 C)-98.6 F (37 C)] 98.6 F (37  C) (07/25 1312) Pulse Rate:  [59-72] 72 (07/25 1312) Resp:  [18-20] 18 (07/25 1312) BP: (143-158)/(82-96) 150/82 mmHg (07/25 1312) SpO2:  [93 %-100 %] 100 % (07/25 1312)  Intake/Output from previous day: 07/24 0701 - 07/25 0700 In: 965 [I.V.:865; IV Piggyback:100] Out: 950 [Urine:950] Intake/Output this shift: Total I/O In: -  Out: 750 [Urine:750]  General appearance: distracted, fatigued, no distress and uncooperative Resp: diminished breath sounds RUL Cardio: regular rate and rhythm, S1, S2 normal, no murmur, click, rub or gallop GI: soft, non-tender; bowel sounds normal; no masses,  no organomegaly Extremities: extremities normal, atraumatic, no cyanosis or edema  Lab Results:   Recent Labs  03/29/15 0534 03/30/15 0606  WBC 3.5* 4.9  HGB 11.2* 10.6*  HCT 33.7* 31.5*  PLT 215 229   BMET  Recent Labs  03/29/15 0534 03/30/15 0606  NA 139 136  K 3.9 4.0  CL 104 106  CO2 27 21*  GLUCOSE 84 77  BUN 30* 26*  CREATININE 2.25* 1.84*  CALCIUM 10.1 9.6    Studies/Results: Dg Chest Port 1 View  03/28/2015   CLINICAL DATA:  Productive cough and rows. Possible aspiration pneumonia. History of right lung cancer.  EXAM: PORTABLE CHEST - 1 VIEW  COMPARISON:  Chest radiographs and CT 03/25/2015  FINDINGS: Cardiomediastinal silhouette is unchanged with persistent medial right upper lobe suprahilar soft tissue mass narrowing the right mainstem bronchus, better demonstrated on recent CT. There is persistent patchy opacity in the right lower lobe, with new mild opacity in the left lung base. No sizable pleural effusion or pneumothorax is identified. Residual contrast is noted in the splenic flexure of the colon.  IMPRESSION: 1.  Patchy bibasilar opacities, new on the left and suspicious for pneumonia or aspiration. 2. Unchanged right upper lobe mass.   Electronically Signed   By: Logan Bores   On: 03/28/2015 19:01    Medications: I have reviewed the patient's current  medications.   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 Unfortunately has evidence for disease progression in the chest. His performance status is very poor and I don't think the patient will be a candidate for any further systemic chemotherapy. I strongly recommended for the patient and his family to consider palliative care and hospice. They are unrealistic about their approach for his condition and they are still looking for some aggressive measures. I explained to the patient and his family that I would not consider him for any systemic chemotherapy at this point unless there is improvement in his performance status. 2) new metastatic brain lesion: Continue his treatment with Decadron. The family would like to reconsult with Dr. Tammi Klippel regarding palliative radiotherapy to the brain. I would request inpatient consult for Dr. Tammi Klippel. 3) recurrent pneumonia: Continue his current medication with Zosyn and vancomycin. 4) prognosis: Very poor. Thank you for taking good care of Mr. Hamm, I will continue to follow up the patient with you and assist in his management on as-needed basis.   LOS: 5 days    Sanna Porcaro K. 03/30/2015

## 2015-03-30 NOTE — Telephone Encounter (Signed)
s.w. MR love and advised that Dr. Tyrell Antonio could not just see family in appt with out pt....he can see them in the hospital later this afternoon....ok and aware

## 2015-03-30 NOTE — Clinical Documentation Improvement (Signed)
Please clarify if patient indicates clinical condition   Possible Conditions _______________cerbral edema _______________Other Not able to determine  Sign & Symptoms: nausea/ vomiting, lethargy  Diagnostics:CT HEAD WITHOUT CONTRAST Mild atrophic changes are noted. In the left parietal region there is a 2.1 x 2.9 cm peripherally dense mass lesion with associated mass-effect and very minimal midline shift of 2 mm from left to right. Mass-effect upon the adjacent gyri is noted as well.  Treatment:CT scan of the head done 7/20 = metastatic 2 cm round mass in the left parietal region  - - Phenergran PRN for nausea  - Decadron to reduce swelling in brain resulting from met and ativan PRN for an seizure-like activ  Thank you,  Philippa Chester ,RN Clinical Documentation Specialist:  [employer]

## 2015-03-30 NOTE — Telephone Encounter (Signed)
[name] called.  In hospital awaiting to see Dr. Julien Nordmann for consult.  Will leave at 3:30 pm to go to work at 4:00 pm.  Return nu,ber for Mr. Stephen Ellis is [phone].

## 2015-03-30 NOTE — Progress Notes (Signed)
SLP Cancellation Note  Patient Details Name: ABDALLA NARAMORE MRN: [id number] DOB: Feb 08, 1946   Cancelled treatment:       Reason Eval/Treat Not Completed: Medical issues which prohibited therapy;Other (comment) (rn reports pt now with new pna, coughing on secretions, concern for ongoing aspiration)  Note palliative following.   Luanna Salk, [facility] SLP [phone]

## 2015-03-30 NOTE — Progress Notes (Signed)
[facility] PROGRESS NOTE  Stephen Ellis CBJ:[id number] DOB: 1946/05/30 DOA: 03/24/2015 PCP: Elizabeth Palau, MD  HPI/Subjective: 69 year old male with lung cancer with neuroendocrine diff, plasma cell dyscrasia, HTN, dementia and anemia presented to the ED on 03/24/15 for vomiting. He has been treated multiple times in the past few months for aspiration and post-obstructive pneumonia. His most recent treatments of chemotherapy and radiation for lung cancer were in May 2016.  Patient is non-verbal and per family he has had a wet cough x 3 weeks, has been vomiting the past few days and has been non-verbal for the past 3-4 weeks with no known falls. Speech therapy has been involved.   Subjective  Drooling out of left side of the mouth Nods head no when i ask "are you in pain" and "do you need anything" Nursing reports no other concerns  Assessment/Plan: 1. Pneumonia, aspiration versus postobstructive - Patient is afebrile currently no tachycardia, tachypnea or elevated WBC although does have cancer history  - Has been afebrile since 03/25/15 - On Zosyn, dosing per pharmacy, discontinue Vanc 03/27/15 - Chest CT: Consistent with spread of cancer although patient does have a history of postobstructive and aspiration pneumonia - Blood cultures pending 7/24 sets performed and all are pending - Symbicort BID, duoneb PRN  -Although he's been cleared by speech therapy for diet he has developed coughing and a new aspiration pneumonia on chest x-ray dated 7/23 -I mentioned to the son 7.23 at the bedside that his prognosis is very guarded -Family is still processing this per discussion with Dr. Hilma Favors today 7.25.16 as well as ONcologist as they had requested a formal opinion from Dr. Earlie Ellis -Family is unable to completely grasp the gravity of Stephen. Ellis's multifactorial failure to thrive and decline -his prognosis is VERY GAURDED and although he is a full code, it would be harmful to  aggressively resuscitate Stephen Ellis if a life threatenining event ovvured  2. Hypertension - Stable - On amlodipine 10 mg daily  3. Metastatic Lung Cancer, Right hilum-currently stage IV disease given new metastases - Being following by radiation oncology -Has recently completed XRT  -CT scan of the head done 7/20 = metastatic 2 cm round mass in the left parietal region  -patient has previously declined systemic chemotherapy in May 2015 - Phenergran PRN for nausea  - changed Decadron 10 mg reduced to 5 mg daily IV on 7/25/16to reduce swelling in brain resulting from met and ativan PRN for an seizure-like activity - Palliative care input greatly appreciated  4. Aphonia - secondary to 2 X3 centimeter metastatic spread to left parietal region of the brain - Per brother and nephew, change in baseline three-four weeksprior to admission - Generalized right sided weakness, more weakness in right upper extremity than right lower extremity, though weakness has improved considerably from admission - SLP consulted, re-evaluated on 03/27/15 and patient passed bedside swallow -Would keep nothing by mouth as new pneumonia on 7/23  5. Prior plasma cell dyscrasia/myeloma - Defer to oncology  6. Acute Kidney Injury  - BUN /creatinine steadily worsening In the setting of poor by mouth intake and inadequate hydration -Started IV saline 75 cc per hour 7/23 until goals of care can be finalized  7. Reported dementia - Patient was alert and interacted appropriately today, though non-verbal -Patient not on any home meds -Re-orient and monitor    Code Status: Full Family Communication:no family present-see extensive notes by Palliative and Dr. Earlie Ellis from today 7.25.16 -I have had daily conversations  with Stephen Ellis his HCPOA discussing and clarifying course and trajectory until today and will call Stephen Ellis tomorrow after further insight from Dr. Tammi Ellis Disposition Plan: Remain  inpatient  Consultants:  Palliative  SLP  Antibiotics:  Vanc 03/24/15 >> 03/27/15  Zosyn 03/24/15 >>  Objective: Filed Vitals:   03/30/15 1312  BP: 150/82  Pulse: 72  Temp: 98.6 F (37 C)  Resp: 18    Intake/Output Summary (Last 24 hours) at 03/30/15 1652 Last data filed at 03/30/15 1313  Gross per 24 hour  Intake    965 ml  Output   1250 ml  Net   -285 ml   Filed Weights   03/25/15 0625  Weight: 68.6 kg (151 lb 3.8 oz)    Exam:   General: More awake alert nodding head seems to understand discussion going on about about him-visibly diaphoretic some mouth twisting  Cardiovascular: RRR, no m/r/g, no LE edema, 2+ DP pulses  Respiratory: Clear to ausculation, some upper respiratory congestion heard, occasional cough, normal respiratory effort     Data Reviewed: Basic Metabolic Panel:  Recent Labs Lab 03/24/15 2336 03/25/15 0758 03/26/15 0426 03/28/15 0540 03/29/15 0534 03/30/15 0606  NA 130*  --  133* 133* 139 136  K 3.8  --  3.5 5.0 3.9 4.0  CL 97*  --  99* 102 104 106  CO2 27  --  23 20* 27 21*  GLUCOSE 96  --  95 87 84 77  BUN 8  --  16 37* 30* 26*  CREATININE 1.14 1.16 1.53* 2.74* 2.25* 1.84*  CALCIUM 10.0  --  9.5 9.8 10.1 9.6   Liver Function Tests:  Recent Labs Lab 03/24/15 2336 03/26/15 0426 03/30/15 0606  AST _0 ALT 13* 9* 12*  ALKPHOS 55 43 34*  BILITOT 0.8 0.9 0.6  PROT 10.1* 8.3* 8.3*  ALBUMIN 3.0* 2.4* 2.5*    Recent Labs Lab 03/24/15 2336  LIPASE 28   CBC:  Recent Labs Lab 03/24/15 2336 03/25/15 0758 03/26/15 0426 03/29/15 0534 03/30/15 0606  WBC 8.2 8.8 9.7 3.5* 4.9  NEUTROABS  --   --  7.9*  --   --   HGB 12.1* 10.4* 10.4* 11.2* 10.6*  HCT 36.9* 31.7* 30.7* 33.7* 31.5*  MCV 94.1 94.3 92.5 92.6 92.9  PLT 235 208 210 215 229    Recent Results (from the past 240 hour(s))  Blood culture (routine x 2)     Status: None   Collection Time: 03/24/15 11:51 PM  Result Value Ref Range Status   Specimen  Description BLOOD LEFT FOREARM  Final   Special Requests BOTTLES DRAWN AEROBIC AND ANAEROBIC 5ML  Final   Culture   Final    NO GROWTH 5 DAYS Performed at [facility]    Report Status 03/30/2015 FINAL  Final  Blood culture (routine x 2)     Status: None   Collection Time: 03/25/15 12:00 AM  Result Value Ref Range Status   Specimen Description BLOOD LEFT HAND  Final   Special Requests BOTTLES DRAWN AEROBIC ONLY 4ML  Final   Culture   Final    NO GROWTH 5 DAYS Performed at [facility]    Report Status 03/30/2015 FINAL  Final  MRSA PCR Screening     Status: None   Collection Time: 03/25/15  8:56 AM  Result Value Ref Range Status   MRSA by PCR NEGATIVE NEGATIVE Final    Comment:  The GeneXpert MRSA Assay (FDA approved for NASAL specimens only), is one component of a comprehensive MRSA colonization surveillance program. It is not intended to diagnose MRSA infection nor to guide or monitor treatment for MRSA infections.   Culture, blood (routine x 2)     Status: None   Collection Time: 03/25/15  2:25 PM  Result Value Ref Range Status   Specimen Description BLOOD LEFT HAND  Final   Special Requests BOTTLES DRAWN AEROBIC AND ANAEROBIC  10CC  Final   Culture   Final    NO GROWTH 5 DAYS Performed at [facility]    Report Status 03/30/2015 FINAL  Final  Culture, blood (routine x 2)     Status: None   Collection Time: 03/25/15  2:30 PM  Result Value Ref Range Status   Specimen Description BLOOD RIGHT ANTECUBITAL  Final   Special Requests BOTTLES DRAWN AEROBIC AND ANAEROBIC  10CC  Final   Culture   Final    NO GROWTH 5 DAYS Performed at [facility]    Report Status 03/30/2015 FINAL  Final   Studies: Dg Chest Port 1 View  03/28/2015   CLINICAL DATA:  Productive cough and rows. Possible aspiration pneumonia. History of right lung cancer.  EXAM: PORTABLE CHEST - 1 VIEW  COMPARISON:  Chest radiographs and CT 03/25/2015  FINDINGS:  Cardiomediastinal silhouette is unchanged with persistent medial right upper lobe suprahilar soft tissue mass narrowing the right mainstem bronchus, better demonstrated on recent CT. There is persistent patchy opacity in the right lower lobe, with new mild opacity in the left lung base. No sizable pleural effusion or pneumothorax is identified. Residual contrast is noted in the splenic flexure of the colon.  IMPRESSION: 1. Patchy bibasilar opacities, new on the left and suspicious for pneumonia or aspiration. 2. Unchanged right upper lobe mass.   Electronically Signed   By: Logan Bores   On: 03/28/2015 19:01    Scheduled Meds: . amLODipine  10 mg Oral Daily  . antiseptic oral rinse  7 mL Mouth Rinse q12n4p  . budesonide-formoterol  2 puff Inhalation BID  . chlorhexidine  15 mL Mouth Rinse BID  . dexamethasone  10 mg Intravenous Daily  . dronabinol  2.5 mg Oral BID AC  . heparin  5,000 Units Subcutaneous 3 times per day  . piperacillin-tazobactam (ZOSYN)  IV  3.375 g Intravenous Q8H   Active Problems:   Postobstructive pneumonia   Essential hypertension   Bronchogenic carcinoma of right lung   Pneumonia   Lung cancer, main bronchus   Aphonia   Brain metastasis   Dysphagia  Time spent: 56 Verneita Griffes, MD [facility] [phone]

## 2015-03-30 NOTE — Progress Notes (Signed)
Daily Progress Note   Patient Name: Stephen Ellis       Date: 03/30/2015 DOB: [birth year]  Age: 69 y.o. MRN#: [id number] Attending Physician: Nita Sells, MD Primary Care Physician:  Palau, MD Admit Date: 03/24/2015  Reason for Consultation/Follow-up: Disposition, Establishing goals of care, Psychosocial/spiritual support and Terminal care  Subjective: Mr. Ackers is awake, he make good eye contact he can answer yes and no with his head- it is difficult to engage him in complex conversation- he becomes tearful when I talk to him about how serious his condition is. Family not realistic about prognosis- I have been direct with them. They feel this happened "too quickly" and they have been "fighting this"- strong faith that he will be healed. There are also high levels of mistrust among family- they are at the bedside. They are asking if I can give him medical mariajuana to stimulate his appetite and help treat the cancer- I was very clear with them that this would not treat his cancer- I agreed to low dose trial for appetite stimulation only. I explained the location of the large brain metastasis. They asked about alternative treatments because people at their church had been cured by these.  Main issues now is his swallowing and severe aspiration- I DO NOT THINK A FEEDING TUBE IS MEDICALLY INDICATED THEREFORE I WILL NOT OFFER THIS. I recommend very careful hand feeding, OOB for meals and crushing oral meds. He will be at risk for aspiration just due to general weakness and progression of his brain mets.    Length of Stay: 5 days  Current Medications: Scheduled Meds:  . amLODipine  10 mg Oral Daily  . antiseptic oral rinse  7 mL Mouth Rinse q12n4p  . budesonide-formoterol  2 puff Inhalation BID  . chlorhexidine  15 mL Mouth Rinse BID  . dexamethasone  10 mg Intravenous Daily  . heparin  5,000 Units Subcutaneous 3 times per day  . piperacillin-tazobactam (ZOSYN)  IV  3.375  g Intravenous Q8H    Continuous Infusions: . sodium chloride 75 mL/hr at 03/30/15 0610    PRN Meds: ipratropium-albuterol, LORazepam, promethazine, senna-docusate  Palliative Performance Scale: 30%     Vital Signs: BP 150/82 mmHg  Pulse 72  Temp(Src) 98.6 F (37 C) (Oral)  Resp 18  Ht '5\' 8"'$  (1.727 m)  Wt 68.6 kg (151 lb 3.8 oz)  BMI 23.00 kg/m2  SpO2 100% SpO2: SpO2: 100 % O2 Device: O2 Device: Not Delivered O2 Flow Rate:    Intake/output summary:  Intake/Output Summary (Last 24 hours) at 03/30/15 1440 Last data filed at 03/30/15 1313  Gross per 24 hour  Intake    965 ml  Output   1250 ml  Net   -285 ml   LBM:   Baseline Weight: Weight: 68.6 kg (151 lb 3.8 oz) Most recent weight: Weight: 68.6 kg (151 lb 3.8 oz)  Physical Exam: Chronically ill appearing gentleman Shallow respirations, crackles in his bases  Slightly distended abdomen   Abnormal left eye  Additional Data Reviewed: Recent Labs     03/29/15  0534  03/30/15  0606  WBC  3.5*  4.9  HGB  11.2*  10.6*  PLT  215  229  NA  139  136  BUN  30*  26*  CREATININE  2.25*  1.84*     Problem List:  Patient Active Problem List   Diagnosis Date Noted  . Aphonia 03/25/2015  . Malnutrition of moderate degree 03/14/2015  .  Aspiration pneumonia 03/13/2015  . CKD (chronic kidney disease) stage 3, GFR 30-59 ml/min 03/13/2015  . ARF (acute renal failure) 03/13/2015  . Lung cancer, main bronchus 01/04/2015  . Pneumonia 12/26/2014  . AKI (acute kidney injury) 09/20/2014  . Acute respiratory failure with hypoxia 09/06/2014  . Anemia of chronic disease 09/06/2014  . Leukopenia 09/06/2014  . HCAP (healthcare-associated pneumonia) 09/01/2014  . Essential hypertension 09/01/2014  . Bronchogenic carcinoma of right lung   . Postobstructive pneumonia 08/07/2014  . Plasma cell dyscrasia 04/19/2012     Palliative Care Assessment & Plan    Code Status:  Full code  Goals of Care:  Continue all  medically reasonable interventions  They want oncology assessment and treatment options to be discussed- I told them there were no additional treatment options for the cancer and no cure- I offered treatment approaches to his swallowing, his pain and for his comfort.  Desire for further Chaplaincy support:yes  3. Symptom Management:  Allow PO intake for his comfort  PRN pain medication  4. Palliative Prophylaxis:  Stool Softner: YES Needs Seizure PRN  5. Prognosis: < 6 months  5. Discharge Planning: This gentleman needs hospice care- family not iopen to this discussion until they explore all possible other options for treatment.   Care plan was discussed with Dr. Earlie Server, Family and Dr. Verlon Au  Thank you for allowing the Palliative Medicine Team to assist in the care of this patient.  Time: 215PM-250PM 35 minutes  Greater than 50%  of this time was spent counseling and coordinating care related to the above assessment and plan.   Acquanetta Chain, DO  03/30/2015, 2:40 PM  Please contact Palliative Medicine Team phone at [phone] for questions and concerns.
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
## 2015-03-31 NOTE — Progress Notes (Signed)
[facility] PROGRESS NOTE  Stephen Ellis KGY:[id number] DOB: [birth year] DOA: 03/24/2015 PCP: Stephen Palau, MD  HPI/Subjective:  69 year old male with lung cancer with neuroendocrine diff, plasma cell dyscrasia, HTN, dementia and anemia presented to the ED on 03/24/15 for vomiting. He has been treated multiple times in the past few months for aspiration and post-obstructive pneumonia. His most recent treatments of chemotherapy and radiation for lung cancer were in May 2016.  Patient is non-verbal and per family he has had a wet cough x 3 weeks, has been vomiting the past few days and has been non-verbal for the past 3-4 weeks with no known falls. Speech therapy has been involved.   Subjective  Drooling out of left side of the mouth Nods head no when i ask "are you in pain" and "do you need anything" Nursing reports no other concerns  Assessment/Plan: 1. Pneumonia, aspiration versus postobstructive - Patient is afebrile currently no tachycardia, tachypnea or elevated WBC although does have cancer history  - Has been afebrile since 03/25/15 - On Zosyn, dosing per pharmacy, discontinue Vanc 03/27/15 - Chest CT: Consistent with spread of cancer although patient does have a history of postobstructive and aspiration pneumonia - Blood cultures pending 7/24 sets performed and all are pending - Symbicort BID, duoneb PRN  -Although he's been cleared by speech therapy for diet he has developed coughing and a new aspiration pneumonia on chest x-ray dated 7/23 -I have mentioned multiple times to Stephen Ellis the [place] and to his son 7.23 at the bedside that his prognosis is very guarded -Family is still processing this per discussion with Dr. Hilma Ellis today 7.25.16 as well as Oncologist as they had requested a formal opinion from Dr. Earlie Ellis -Patient's family awaiting input from Dr. Tammi Ellis of [place] which is pending 7/27 am -Family is unable to completely grasp the gravity of Stephen. Stephen Ellis's  multifactorial failure to thrive and decline -his prognosis is VERY GAURDED and although he is a full code, it would be harmful to aggressively resuscitate Stephen Ellis if a life threatenining event occured  2. Hypertension - Stable - On amlodipine 10 mg daily  3. Metastatic Lung Cancer, Right hilum-currently stage IV disease given new metastases - Previously recently completed XRT to lung -CT scan of the head done 7/20 = metastatic 2 cm round mass in the left parietal region  -patient has previously declined systemic chemotherapy in May 2015 - Phenergran PRN for nausea  - changed Decadron 10 mg reduced to 5 mg daily IV on 7/25/16to reduce swelling in brain resulting from met and ativan PRN for an seizure-like activity - Palliative care input greatly appreciated  4. Aphonia - secondary to 2 X3 centimeter metastatic spread to left parietal region of the brain - Per brother and nephew, change in baseline three-four weeks prior to admission - Generalized right sided weakness, more weakness in right upper extremity than right lower extremity, though weakness has improved considerably from admission - SLP consulted, re-evaluated on 03/27/15 and patient passed bedside swallow -Would keep nothing by mouth as new pneumonia on 7/23  5. Prior plasma cell dyscrasia/myeloma - Defer to oncology  6. Acute Kidney Injury  - BUN /creatinine steadily worsening In the setting of poor by mouth intake and inadequate hydration -Started IV saline 75 cc per hour 7/23 until goals of care can be finalized  7. Reported dementia - Patient was alert and interacted appropriately today, though non-verbal -Patient not on any home meds -Re-orient and monitor    Code Status:  Full Family Communication:no family present-see extensive notes by Palliative and Dr. Earlie Ellis from today 7.25.16 -I have had daily conversations with Stephen Ellis his HCPOA  -We await Radiation Oncology Disposition Plan: Remain  inpatient  Consultants:  Palliative  SLP  Antibiotics:  Vanc 03/24/15 >> 03/27/15  Zosyn 03/24/15 >>  Objective: Filed Vitals:   03/31/15 0920  BP:   Pulse: 72  Temp:   Resp: 16    Intake/Output Summary (Last 24 hours) at 03/31/15 1818 Last data filed at 03/30/15 2300  Gross per 24 hour  Intake   1260 ml  Output    700 ml  Net    560 ml   Filed Weights   03/25/15 0625  Weight: 68.6 kg (151 lb 3.8 oz)    Exam:   General: More awake alert nodding head seems to understand discussion going on about about him-visibly diaphoretic some mouth twisting  Cardiovascular: RRR, no m/r/g, no LE edema, 2+ DP pulses  Respiratory: Clear to ausculation, some upper respiratory congestion heard, occasional cough, normal respiratory effort     Data Reviewed: Basic Metabolic Panel:  Recent Labs Lab 03/24/15 2336 03/25/15 0758 03/26/15 0426 03/28/15 0540 03/29/15 0534 03/30/15 0606  NA 130*  --  133* 133* 139 136  K 3.8  --  3.5 5.0 3.9 4.0  CL 97*  --  99* 102 104 106  CO2 27  --  23 20* 27 21*  GLUCOSE 96  --  95 87 84 77  BUN 8  --  16 37* 30* 26*  CREATININE 1.14 1.16 1.53* 2.74* 2.25* 1.84*  CALCIUM 10.0  --  9.5 9.8 10.1 9.6   Liver Function Tests:  Recent Labs Lab 03/24/15 2336 03/26/15 0426 03/30/15 0606  AST $Re'22 17 20  'Vgx$ ALT 13* 9* 12*  ALKPHOS 55 43 34*  BILITOT 0.8 0.9 0.6  PROT 10.1* 8.3* 8.3*  ALBUMIN 3.0* 2.4* 2.5*    Recent Labs Lab 03/24/15 2336  LIPASE 28   CBC:  Recent Labs Lab 03/24/15 2336 03/25/15 0758 03/26/15 0426 03/29/15 0534 03/30/15 0606  WBC 8.2 8.8 9.7 3.5* 4.9  NEUTROABS  --   --  7.9*  --   --   HGB 12.1* 10.4* 10.4* 11.2* 10.6*  HCT 36.9* 31.7* 30.7* 33.7* 31.5*  MCV 94.1 94.3 92.5 92.6 92.9  PLT 235 208 210 215 229    Recent Results (from the past 240 hour(s))  Blood culture (routine x 2)     Status: None   Collection Time: 03/24/15 11:51 PM  Result Value Ref Range Status   Specimen Description BLOOD LEFT  FOREARM  Final   Special Requests BOTTLES DRAWN AEROBIC AND ANAEROBIC 5ML  Final   Culture   Final    NO GROWTH 5 DAYS Performed at [facility]    Report Status 03/30/2015 FINAL  Final  Blood culture (routine x 2)     Status: None   Collection Time: 03/25/15 12:00 AM  Result Value Ref Range Status   Specimen Description BLOOD LEFT HAND  Final   Special Requests BOTTLES DRAWN AEROBIC ONLY 4ML  Final   Culture   Final    NO GROWTH 5 DAYS Performed at [facility]    Report Status 03/30/2015 FINAL  Final  MRSA PCR Screening     Status: None   Collection Time: 03/25/15  8:56 AM  Result Value Ref Range Status   MRSA by PCR NEGATIVE NEGATIVE Final    Comment:  The GeneXpert MRSA Assay (FDA approved for NASAL specimens only), is one component of a comprehensive MRSA colonization surveillance program. It is not intended to diagnose MRSA infection nor to guide or monitor treatment for MRSA infections.   Culture, blood (routine x 2)     Status: None   Collection Time: 03/25/15  2:25 PM  Result Value Ref Range Status   Specimen Description BLOOD LEFT HAND  Final   Special Requests BOTTLES DRAWN AEROBIC AND ANAEROBIC  10CC  Final   Culture   Final    NO GROWTH 5 DAYS Performed at [facility]    Report Status 03/30/2015 FINAL  Final  Culture, blood (routine x 2)     Status: None   Collection Time: 03/25/15  2:30 PM  Result Value Ref Range Status   Specimen Description BLOOD RIGHT ANTECUBITAL  Final   Special Requests BOTTLES DRAWN AEROBIC AND ANAEROBIC  10CC  Final   Culture   Final    NO GROWTH 5 DAYS Performed at [facility]    Report Status 03/30/2015 FINAL  Final   Studies: No results found.  Scheduled Meds: . amLODipine  10 mg Oral Daily  . antiseptic oral rinse  7 mL Mouth Rinse q12n4p  . budesonide-formoterol  2 puff Inhalation BID  . chlorhexidine  15 mL Mouth Rinse BID  . dexamethasone  5 mg Intravenous Daily  . dronabinol   2.5 mg Oral BID AC  . heparin  5,000 Units Subcutaneous 3 times per day  . piperacillin-tazobactam (ZOSYN)  IV  3.375 g Intravenous Q8H   Active Problems:   Postobstructive pneumonia   Essential hypertension   Bronchogenic carcinoma of right lung   Pneumonia   Lung cancer, main bronchus   Aphonia   Brain metastasis   Dysphagia  Time spent: 10 Verneita Griffes, MD [facility] [phone]

## 2015-03-31 NOTE — Progress Notes (Addendum)
Speech Language Pathology Treatment: Dysphagia  Patient Details Name: Stephen Ellis MRN: [id number] DOB: 06-25-1946 Today's Date: 03/31/2015 Time: 7124-5809 SLP Time Calculation (min) (ACUTE ONLY): 34 min  Assessment / Plan / Recommendation Clinical Impression  Skilled SLP treatment to aid in determining most effective/efficient diet/compensation strategies.  Pt severely dysarthric and lingual/labial weakness results in severe oral dysphagia.  Observed pt with thin water - thin soda, Ensure, and applesauce.  Excessive oral holding noted with applesauce and Ensure due to weakness requiring SLP to orally suction pt.    Oral transiting and clearance much improved with liquids due to increased velocity.  No s/s of aspiration with po observed today - however pt will certainly have ongoing aspiration/malnutrition due to level of dysphagia.   Intake of liquids will be easiest for pt and pureed consistencies likely not enjoyable due to severe oral deficits.  Recommend allow pt to self feed liquids for comfort,neuro input.  Left suction turned on and within pt's reach per his request.  Educated pt to recommendations using teach back *via head nod* and posted signs in room re: swallow precautions.  Informed nurse tech and nurse to findings.  PO intake for comfort only - not nutritional support.   SLP to follow up x1 to educate family when available.    HPI Other Pertinent Information: Patient is a 69 year old man admitted after vomiting dark material with concern for aspiration.  PMH + with history of lung cancer status post chemotherapy in April and radiation that finished in May with a history of postobstructive pneumonia and difficulty swallowing who presents to the hospital with c/o increased difficulty swallowing and lethargy. CXR shows residual or recurrent RLL PNA.  CT chest indicated 1. No evidence of pulmonary embolus. 2. Recurrent patchy opacities at the right lower lung lobe, with associated right  pleural effusion concerning for pna,  Mildly enlarged azygoesophageal recess node is concerning for metastatic disease. Partial extension of mass into the right paratracheal region.     Pertinent Vitals Pain Assessment: No/denies pain  SLP Plan  Continue with current plan of care    Recommendations Diet recommendations: Thin liquid Liquids provided via: Cup (pt unable to form suction today on straw) Medication Administration: Crushed with puree (? with liquids or icecream to ease oral transiting) Supervision: Staff to assist with self feeding;Full supervision/cueing for compensatory strategies (pt able to consume liquids independently, assist with meals) Compensations: Slow rate;Small sips/bites;Check for anterior loss (head tilt as pt able to conduct, oral suction if pt does not elicit swallow, oral suction after meals) Postural Changes and/or Swallow Maneuvers: Seated upright 90 degrees;Upright 30-60 min after meal              Oral Care Recommendations: Oral care BID Follow up Recommendations: Skilled Nursing facility Plan: Continue with current plan of care    [place], [place] [facility] SLP [phone]   Note: Attempted communication board with pt without success. He was unable to locate items from field of 4.  Yes/no responses are optimal communication method for pt.

## 2015-03-31 NOTE — Progress Notes (Signed)
Physical Therapy Treatment Patient Details Name: ALVIN DIFFEE MRN: [id number] DOB: [birth year] Today's Date: 03/31/2015    History of Present Illness 69 year old male with lung cancer with neuroendocrine diff, plasma cell dyscrasia, HTN, dementia and anemia presented to the ED 03/24/15  for vomiting. Patient is non-verbal, history provided by the ED notes. He presented from [place] for vomiting dark material and not feeling well. He is being treated for pneumonia and is currently taking amoxicillin. He was also recently treated for pneumonia multiples time in the hospital.     PT Comments    Pt progressing this visit, able to get to chair with +1 assist (see below for details); will continue to follow and assess for needs  Follow Up Recommendations  SNF     Equipment Recommendations  None recommended by PT    Recommendations for Other Services       Precautions / Restrictions Precautions Precautions: Fall Precaution Comments: nonverbal (ataxic) Restrictions Weight Bearing Restrictions: No    Mobility  Bed Mobility Overal bed mobility: Needs Assistance Bed Mobility: Supine to Sit     Supine to sit: Mod assist;HOB elevated     General bed mobility comments: assist for trunk and LEs, multi-modal cues for  technique  Transfers Overall transfer level: Needs assistance Equipment used: 1 person hand held assist Transfers: Sit to/from [facility] Sit to Stand: Mod assist Stand pivot transfers: Mod assist       General transfer comment: Assist to rise, stabilize, control descent. Multimodal cues for safety, technique, hand placement   Ambulation/Gait                 Stairs            Wheelchair Mobility    Modified Rankin (Stroke Patients Only)       Balance                                    Cognition Arousal/Alertness: Awake/alert Behavior During Therapy: WFL for tasks assessed/performed Overall  Cognitive Status: Difficult to assess Area of Impairment: Following commands;Problem solving       Following Commands: Follows one step commands with increased time     Problem Solving: Slow processing;Decreased initiation;Difficulty sequencing;Requires verbal cues;Requires tactile cues      Exercises General Exercises - Lower Extremity Heel Slides: Strengthening;Both;10 reps    General Comments        Pertinent Vitals/Pain Pain Assessment: No/denies pain    Home Living                      Prior Function            PT Goals (current goals can now be found in the care plan section) Acute Rehab PT Goals Patient Stated Goal: pt can't state PT Goal Formulation: With family Time For Goal Achievement: 04/12/15 Potential to Achieve Goals: Fair Progress towards PT goals: Progressing toward goals    Frequency  Min 2X/week    PT Plan Current plan remains appropriate    Co-evaluation             End of Session Equipment Utilized During Treatment: Gait belt Activity Tolerance: Patient tolerated treatment well Patient left: in chair;with call bell/phone within reach;with chair alarm set;with family/visitor present     Time: 2876-8115 PT Time Calculation (min) (ACUTE ONLY): 32 min  Charges:  $Therapeutic Activity: 23-37 mins  G CodesKenyon Ana 03/31/2015, 3:19 PM

## 2015-03-31 NOTE — Progress Notes (Signed)
Speech Language Pathology Treatment: Dysphagia  Patient Details Name: Stephen Ellis MRN: [id number] DOB: [birth year] Today's Date: 03/31/2015 Time: 1443-1500 SLP Time Calculation (min) (ACUTE ONLY): 17 min  Assessment / Plan / Recommendation Clinical Impression  Skilled treatment to educate family/pt to aspiration precautions to mitigate dysphagia, ease efficiency of swallow and subsequently maximize enjoyment.  Pt with lunch tray on table including mashed potatoes, pureed meat and greens.  Pt declined to consume puree but accepted tea and "coke float" SLP made for pt.  Oral transiting delays noted but no indications of aspiration.  Pt's facial expressions provide cues that he is orally holding - lip closure with obvious oral retention.    Pt did have intermittent cough on secretions and agree that any type of alternative means of nutrition would not change pt's outcomes.   All education completed re: comfort feeding and precautions.  Recommend continue diet with pt self feeding as much as able, oral suction prn and take rest breaks if pt coughing with intake.    SLP to sign off, please reorder if desire.      HPI Other Pertinent Information: Patient is a 69 year old man admitted after vomiting dark material with concern for aspiration.  PMH + with history of lung cancer status post chemotherapy in April and radiation that finished in May with a history of postobstructive pneumonia and difficulty swallowing who presents to the hospital with c/o increased difficulty swallowing and lethargy. CXR shows residual or recurrent RLL PNA.  CT chest indicated 1. No evidence of pulmonary embolus. 2. Recurrent patchy opacities at the right lower lung lobe, with associated right pleural effusion concerning for pna,  Mildly enlarged azygoesophageal recess node is concerning for metastatic disease. Partial extension of mass into the right paratracheal region.     Pertinent Vitals Pain Assessment: No/denies pain   SLP Plan  All goals met;Discharge SLP treatment due to (comment) (goals met, education compelted)    Recommendations Diet recommendations: Thin liquid (full liquids recommended for ease of swallow) Liquids provided via: Cup Medication Administration: Crushed with puree (with liquids or icecream to aid oral transiting) Supervision: Staff to assist with self feeding;Full supervision/cueing for compensatory strategies (pt able to consume liquids independently) Compensations: Slow rate;Small sips/bites;Check for anterior loss;Other (Comment) (assure pt swallows before givng more, oral suction prn) Postural Changes and/or Swallow Maneuvers: Seated upright 90 degrees;Upright 30-60 min after meal              Oral Care Recommendations: Oral care BID Follow up Recommendations: Skilled Nursing facility Plan: All goals met;Discharge SLP treatment due to (comment) (goals met, education compelted)    [place], [place] [facility] SLP [phone]

## 2015-03-31 NOTE — Care Management Important Message (Signed)
Important Message  Patient Details  Name: Stephen Ellis MRN: [id number] Date of Birth: 08-15-1946   Medicare Important Message Given:  Yes-third notification given    Camillo Flaming 03/31/2015, 2:17 [place] Message  Patient Details  Name: Stephen Ellis MRN: [id number] Date of Birth: [birth year]   Medicare Important Message Given:  Yes-third notification given    Camillo Flaming 03/31/2015, 2:17 PM

## 2015-03-31 NOTE — Progress Notes (Signed)
  Radiation Oncology         [phone]) [phone] ________________________________  Name: Stephen Ellis  MRN: [id number]  Date: 03/24/2015  DOB: [birth year]  Chart Note:  I received notification late this afternoon regarding this patient's admission and recent head CT.  I reviewed records and images.  The patient's condition remains guarded related to a variety of complex factors.  I will meet with the patient tomorrow to review options. ________________________________  Sheral Apley Tammi Klippel, M.D.

## 2015-03-31 NOTE — Progress Notes (Signed)
Initial Nutrition Assessment  DOCUMENTATION CODES:   Non-severe (moderate) malnutrition in context of chronic illness  INTERVENTION:  - Will monitor for needs  NUTRITION DIAGNOSIS:   Increased nutrient needs related to catabolic illness, cancer and cancer related treatments as evidenced by estimated needs.  GOAL:   Other (Comment) (Pt to eat as desired)  MONITOR:   PO intake, Weight trends, Labs, I & O's  REASON FOR ASSESSMENT:   Low Braden  ASSESSMENT:  69 year old male with lung cancer with neuroendocrine diff, plasma cell dyscrasia, HTN, dementia and anemia presented to the ED for vomiting. Patient is non-verbal, history provided by the ED notes. He presented from [place] for vomiting dark material and not feeling well. He is being treated for pneumonia.  Pt seen for low Braden. BMI indicates normal weight status. Pt was NPO until 1510 yesterday. In rounds this AM, RN reported that pt continues to cough with any PO intakes and she feels current diet order is for comfort feeds. Will not order nutrition supplements at this time.   Last chemo and radiation was in May for stage 4 lung cancer with new mets. Per weight hx review, pt has lost 13 lbs (8% body weight) in the past 2 months which is significant for time frame. Moderate muscle and fat wasting.   Pt remains nonverbal and no family in the room at time of visit; notes indicate he was nonverbal x3-4 weeks PTA. MD note indicates no plans for TF at this time as it is felt be futil.   Pt not meeting needs. Medications reviewed; Marinol order in place, Palliative note indicates plan to order low dose of medical marijuana for appetite stimulation attempt. Labs reviewed; BUN/creatinine elevated but trending down, GFR: 42.   Diet Order:  DIET - DYS 1 Room service appropriate?: Yes; Fluid consistency:: Thin  Skin:  Wound (see comment) (Wound to back)  Last BM:  7/24  Height:   Ht Readings from Last 1 Encounters:   03/25/15 '5\' 8"'$  (1.727 m)    Weight:   Wt Readings from Last 1 Encounters:  03/25/15 151 lb 3.8 oz (68.6 kg)    Ideal Body Weight:  70 kg (kg)  Wt Readings from Last 10 Encounters:  03/25/15 151 lb 3.8 oz (68.6 kg)  03/13/15 155 lb 3.3 oz (70.4 kg)  02/19/15 164 lb (74.39 kg)  01/22/15 164 lb 12.8 oz (74.753 kg)  01/16/15 163 lb 3.2 oz (74.027 kg)  01/05/15 163 lb 9.3 oz (74.2 kg)  12/27/14 170 lb 10.2 oz (77.4 kg)  12/16/14 176 lb 3.2 oz (79.924 kg)  11/24/14 177 lb 4.8 oz (80.423 kg)  09/25/14 161 lb 12.8 oz (73.392 kg)    BMI:  Body mass index is 23 kg/(m^2).  Estimated Nutritional Needs:   Kcal:  2100-2300  Protein:  80-90 grams  Fluid:  2.2-2.5 L/day  EDUCATION NEEDS:   No education needs identified at this time     Jarome Matin, RD, LDN Inpatient Clinical Dietitian Pager # [phone] After hours/weekend pager # [phone]

## 2015-04-01 ENCOUNTER — Ambulatory Visit
Admit: 2015-04-01 | Discharge: 2015-04-01 | Disposition: A | Discharge status: Home / Self Care | Payer: Medicare HMO | Attending: Radiation Oncology | Admitting: Radiation Oncology

## 2015-04-01 DIAGNOSIS — R491 Aphonia: Secondary | ICD-10-CM

## 2015-04-01 DIAGNOSIS — C3491 Malignant neoplasm of unspecified part of right bronchus or lung: Secondary | ICD-10-CM

## 2015-04-01 DIAGNOSIS — R1310 Dysphagia, unspecified: Secondary | ICD-10-CM

## 2015-04-01 DIAGNOSIS — C7931 Secondary malignant neoplasm of brain: Secondary | ICD-10-CM

## 2015-04-01 MED ORDER — KCL IN DEXTROSE-NACL 20-5-0.45 MEQ/L-%-% IV SOLN
INTRAVENOUS | Status: DC
  Administered 2015-04-01 – 2015-04-03 (×4): via INTRAVENOUS
  Filled 2015-04-01 (×5): qty 1000

## 2015-04-01 MED ORDER — DEXAMETHASONE 4 MG PO TABS
4.0000 mg | ORAL_TABLET | Freq: Every day | ORAL | Status: DC
  Administered 2015-04-02: 4 mg via ORAL
  Filled 2015-04-01 (×3): qty 1

## 2015-04-01 MED ORDER — ENOXAPARIN SODIUM 40 MG/0.4ML ~~LOC~~ SOLN
40.0000 mg | SUBCUTANEOUS | Status: DC
  Administered 2015-04-01 – 2015-04-03 (×3): 40 mg via SUBCUTANEOUS
  Filled 2015-04-01 (×3): qty 0.4

## 2015-04-01 NOTE — Progress Notes (Signed)
Pharmacy Consult Note: Lovenox for VTE Prophylaxis  69 y.o. male with lung cancer treated with radiation admitted 03/24/2015 for pneumonia. Recently admitted and treated for aspiration pneumonia earlier this month.  Pharmacy is consulted to dose Lovenox for VTE prophylaxis; has been receiving SQ heparin since 7/20.  Weight: 68.6 kg SCr: 1.84, improving CrCl: 37 ml/min, improving Hgb: 10.6 Plts: 229  Plan:  Lovenox '40mg'$  SQ q24h since CrCl > 30 ml/min and weight > 45 kg  No further dose adjustments anticipated, Pharmacy will sign-off  Peggyann Juba, PharmD, BCPS Pager: [phone] 04/01/2015 7:20 PM

## 2015-04-01 NOTE — Progress Notes (Signed)
Radiation Oncology         [phone] ________________________________  Name: Stephen Ellis  MRN: [id number]  Date: 03/12/2015  DOB: 1946/05/03  Follow-Up Visit Note  CC: Elizabeth Palau, MD  No ref. 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 Aspiration pneumonia, unspecified aspiration pneumonia type 507.0 J69.0   2. Sepsis 038.9 A41.9 DG Chest 2 View   995.91  DG Chest 2 View  3. Severe comorbid illness 799.89 R69   4. Pneumonia 486 J18.9 DG Chest 2 View     DG Chest 2 View  5. Fever 780.60 R50.9 DG Chest 2 View     DG Chest 2 View    Interval Since Last Radiation:  2  months  Narrative:  Stephen Ellis is a very nice 69 year old gentleman with a very low level performance status at baseline due to chronic dementia. However, he became nonverbal 3-4 weeks ago and was not feeling well. He's had recurrent episodes of pneumonia and was admitted to the hospital on July 20 with pneumonia and a possible aspiration component. Head CT during this admission revealed a 2.9 cm left parietal brain metastasis as well as findings suggestive of left temporal and left cerebellar metastases. The patient was started on dexamethasone. The patient has met with palliative care and medical oncology. At this time, he and his family remain interested in pursuing treatment to help maintain quality of life. Accordingly, he is, been referred today for consideration of whole brain radiation. Of note, the patient recently completed a course of thoracic radiotherapy 2 months ago providing durable improvement in the right lung mass alleviating postobstructive pneumonia. The patient was very compliant with his treatment regimen due to significant family support.   ALLERGIES:  has No Known Allergies.  Meds: No current facility-administered medications for this encounter.   No current outpatient  prescriptions on file.   Facility-Administered Medications Ordered in Other Encounters  Medication Dose Route Frequency Provider Last Rate Last Dose  . amLODipine (NORVASC) tablet 10 mg  10 mg Oral Daily Debby Crosley, MD   10 mg at 04/01/15 1050  . antiseptic oral rinse (CPC / CETYLPYRIDINIUM CHLORIDE 0.05%) solution 7 mL  7 mL Mouth Rinse q12n4p Nita Sells, MD   7 mL at 04/01/15 1600  . budesonide-formoterol (SYMBICORT) 160-4.5 MCG/ACT inhaler 2 puff  2 puff Inhalation BID Quintella Baton, MD   2 puff at 03/31/15 2030  . chlorhexidine (PERIDEX) 0.12 % solution 15 mL  15 mL Mouth Rinse BID Nita Sells, MD   15 mL at 04/01/15 2035  . [START ON 04/02/2015] dexamethasone (DECADRON) tablet 4 mg  4 mg Oral Daily Janece Canterbury, MD      . dextrose 5 % and 0.45 % NaCl with KCl 20 mEq/L infusion   Intravenous Continuous Janece Canterbury, MD 75 mL/hr at 04/01/15 2035    . dronabinol (MARINOL) capsule 2.5 mg  2.5 mg Oral BID AC Acquanetta Chain, DO   2.5 mg at 04/01/15 1649  . enoxaparin (LOVENOX) injection 40 mg  40 mg Subcutaneous Q24H Emiliano Dyer, RPH      . ipratropium-albuterol (DUONEB) 0.5-2.5 (3) MG/3ML nebulizer solution 3 mL  3 mL Nebulization Q4H PRN Debby Crosley, MD      . LORazepam (ATIVAN) injection 1 mg  1 mg Intravenous Q4H PRN Nita Sells, MD      . piperacillin-tazobactam (ZOSYN) IVPB  3.375 g  3.375 g Intravenous Q8H Drew A Wofford, RPH   3.375 g at 04/01/15 2035  . promethazine (PHENERGAN) tablet 12.5 mg  12.5 mg Oral Q6H PRN Debby Crosley, MD      . senna-docusate (Senokot-S) tablet 1 tablet  1 tablet Oral QHS PRN Quintella Baton, MD        Physical Findings: The patient is in no acute distress. Patient is alert and oriented.  height is $RemoveB'5\' 8"'PkjiQfVP$  (1.727 m) and weight is 155 lb 3.3 oz (70.4 kg). His oral temperature is 98.7 F (37.1 C). His blood pressure is 121/88 and his pulse is 86. His respiration is 20 and oxygen saturation is 96%. . The patient is  nonverbal but responds with affirmative and negative head motions upon questioning. No significant changes.  Lab Findings: Lab Results  Component Value Date   WBC 4.9 03/30/2015   WBC 2.6* 01/20/2015   HGB 10.6* 03/30/2015   HGB 11.6* 01/20/2015   HCT 31.5* 03/30/2015   HCT 35.1* 01/20/2015   PLT 229 03/30/2015   PLT 82* 01/20/2015    Lab Results  Component Value Date   NA 136 03/30/2015   NA 138 01/20/2015   K 4.0 03/30/2015   K 3.9 01/20/2015   CHLORIDE 103 01/20/2015   CO2 21* 03/30/2015   CO2 24 01/20/2015   GLUCOSE 77 03/30/2015   GLUCOSE 114 01/20/2015   GLUCOSE 99 07/20/2012   BUN 26* 03/30/2015   BUN 16.4 01/20/2015   CREATININE 1.84* 03/30/2015   CREATININE 1.3 01/20/2015   BILITOT 0.6 03/30/2015   BILITOT 0.29 01/20/2015   ALKPHOS 34* 03/30/2015   ALKPHOS 61 01/20/2015   AST 20 03/30/2015   AST 25 01/20/2015   ALT 12* 03/30/2015   ALT 57* 01/20/2015   PROT 8.3* 03/30/2015   PROT 8.2 01/20/2015   ALBUMIN 2.5* 03/30/2015   ALBUMIN 2.6* 01/20/2015   CALCIUM 9.6 03/30/2015   CALCIUM 9.6 01/20/2015   ANIONGAP 9 03/30/2015   ANIONGAP 11 01/20/2015    Radiographic Findings: Dg Chest 2 View  03/25/2015   CLINICAL DATA:  Vomiting blood, acute onset.  Initial encounter.  EXAM: CHEST  2 VIEW  COMPARISON:  Chest radiograph performed 03/16/2015, and CTA of the chest performed 01/14/2015  FINDINGS: Dense consolidation of a portion of the right upper lobe is grossly unchanged from prior studies, reflecting the known underlying mass. The left lung appears clear. No new superimposed pneumonia is characterized. No pleural effusion or pneumothorax is identified.  The heart is normal in size. No acute osseous abnormalities are identified.  IMPRESSION: Dense consolidation of a portion of the right upper lung lobe is grossly unchanged, reflecting the known underlying mass. No superimposed pneumonia seen.   Electronically Signed   By: Garald Balding M.D.   On: 03/25/2015 01:11     Dg Chest 2 View  03/16/2015   CLINICAL DATA:  Initial encounter for sudden onset fever. History of lung cancer and hypertension.  EXAM: CHEST  2 VIEW  COMPARISON:  03/15/2015.  FINDINGS: AP and lateral views of the thorax again show low lung volumes with slight asymmetric elevation the right hemidiaphragm. Bibasilar collapse/consolidation, right greater than left persist. Opacity along the right mediastinum is compatible with right upper lobe collapse as seen on previous chest CT which would also account for the right-sided volume loss. Cardiopericardial silhouette is upper limits of normal for size. Imaged bony structures of the thorax are intact.  IMPRESSION: No substantial interval change. Right greater  than left basilar collapse/consolidation with persistent abnormal right paramediastinal opacity, likely related to right upper lobe collapse.   Electronically Signed   By: Misty Stanley M.D.   On: 03/16/2015 13:38   Dg Chest 2 View  03/15/2015   CLINICAL DATA:  Cough.  History lung cancer.  EXAM: CHEST  2 VIEW  COMPARISON:  03/12/2015 and prior exams  FINDINGS: The cardiomediastinal silhouette is unchanged.  This is a low volume film.  Right upper and lower lobe atelectasis/consolidation again noted.  Mild left lower lobe atelectasis/ airspace disease noted.  There is no evidence of pneumothorax or definite pleural effusion.  IMPRESSION: Low volume film with continued right upper and lower lobe consolidations/ atelectasis and mild left lower lobe consolidations/atelectasis.  No other significant change.   Electronically Signed   By: Margarette Canada M.D.   On: 03/15/2015 10:28   Dg Chest 2 View  03/12/2015   CLINICAL DATA:  Current history of right upper lobe lung cancer, presenting with acute fever and dehydration.  EXAM: CHEST  2 VIEW  COMPARISON:  CT chest 01/04/2015 and earlier. Chest x-rays 01/02/2015 and earlier.  FINDINGS: AP semi-erect and lateral images were obtained. Cardiac silhouette normal in  size, unchanged. Central right upper lobe mass extending into the right hilum with postobstructive atelectasis involving the right upper lobe, unchanged. Persistent or recurrent patchy airspace opacities in the right lower lobe. Lungs remain clear otherwise. No pleural effusions. Pulmonary vascularity normal. No pneumothorax. Degenerative changes and DISH involving the thoracic spine.  IMPRESSION: 1. Residual or recurrent pneumonia involving the right lower lobe since the 01/02/2015 chest x-ray. 2. Stable central right upper lobe lung mass extending into the right hilum with postobstructive atelectasis involving the right upper lobe.   Electronically Signed   By: Evangeline Dakin M.D.   On: 03/12/2015 18:22   Ct Head Wo Contrast  03/25/2015   CLINICAL DATA:  Stroke-like symptoms  EXAM: CT HEAD WITHOUT CONTRAST  TECHNIQUE: Contiguous axial images were obtained from the base of the skull through the vertex without intravenous contrast.  COMPARISON:  09/10/2004  FINDINGS: The bony calvarium is intact. No gross soft tissue abnormality is noted. Mild atrophic changes are noted. In the left parietal region there is a 2.1 x 2.9 cm peripherally dense mass lesion with associated mass-effect and very minimal midline shift of 2 mm from left to right. Mass-effect upon the adjacent gyri is noted as well. Additionally there is an area of increased attenuation in the left temporal lobe best seen on image number 9 of series 2. This may represent a small metastatic deposit. Some regular density is noted in the superior cerebellum on image number 12 of series 2 which may also represent a focal mass lesion. No acute hemorrhage is seen. Scattered chronic white matter ischemic changes are seen.  IMPRESSION: Left parietal mass lesion with associated mass-effect as well as suggestion of smaller lesions in the left temporal lobe and superior cerebellum on the left consistent with metastatic disease. MRI examination with contrast  material is recommended for further evaluation.  These results will be called to the ordering clinician or representative by the Radiologist Assistant, and communication documented in the PACS or zVision Dashboard.   Electronically Signed   By: Inez Catalina M.D.   On: 03/25/2015 15:25   Ct Angio Chest Pe W/cm &/or Wo Cm  03/25/2015   CLINICAL DATA:  Acute onset of nausea and vomiting. Cough. Current history of right-sided lung cancer. Initial encounter.  EXAM:  CT ANGIOGRAPHY CHEST WITH CONTRAST  TECHNIQUE: Multidetector CT imaging of the chest was performed using the standard protocol during bolus administration of intravenous contrast. Multiplanar CT image reconstructions and MIPs were obtained to evaluate the vascular anatomy.  CONTRAST:  122mL OMNIPAQUE IOHEXOL 350 MG/ML SOLN  COMPARISON:  Chest radiograph performed earlier today at 12:20 a.m., and CTA of the chest performed 01/04/2015  FINDINGS: There is no evidence of pulmonary embolus.  Recurrent patchy opacities are noted at the right lower lobe, with an associated small right pleural effusion, concerning for recurrent pneumonia. This is less prominent than in May.  Mass about the right hilum and tracking into the collapsed portion of the right upper lobe is improved, with visualization of part of the right mainstem bronchus, new from the prior study. There is persistent mass effect on the right mainstem bronchus and right-sided bronchioles.  No pneumothorax is identified. The left lung is essentially clear. Mild nodular scarring is noted at the right lung apex.  There is partial extension of mass into the right paratracheal region. A mildly enlarged azygoesophageal recess node is seen, measuring 1.4 cm in short axis. This is concerning for metastatic disease. Scattered periaortic nodes remain normal in size. No pericardial effusion is identified. The great vessels are grossly unremarkable in appearance. No axillary lymphadenopathy is seen. The visualized  portions of the thyroid gland are unremarkable in appearance.  The visualized portions of the liver and spleen are unremarkable.  No acute osseous abnormalities are seen. Anterior bridging osteophytes are noted along the thoracic spine.  Review of the MIP images confirms the above findings.  IMPRESSION: 1. No evidence of pulmonary embolus. 2. Recurrent patchy opacities at the right lower lung lobe, with associated small right pleural effusion, concerning for recurrent pneumonia. 3. Mass about the right hilum and tracking into the collapsed portion of the right upper lobe is improved in appearance, with visualization of part of the right mainstem bronchus (was completely collapsed on the prior CTA). Residual mass-effect on the right mainstem bronchus and right-sided bronchioles. 4. Mildly enlarged azygoesophageal recess node is concerning for metastatic disease. Partial extension of mass into the right paratracheal region.   Electronically Signed   By: Garald Balding M.D.   On: 03/25/2015 03:44   Dg Chest Port 1 View  03/28/2015   CLINICAL DATA:  Productive cough and rows. Possible aspiration pneumonia. History of right lung cancer.  EXAM: PORTABLE CHEST - 1 VIEW  COMPARISON:  Chest radiographs and CT 03/25/2015  FINDINGS: Cardiomediastinal silhouette is unchanged with persistent medial right upper lobe suprahilar soft tissue mass narrowing the right mainstem bronchus, better demonstrated on recent CT. There is persistent patchy opacity in the right lower lobe, with new mild opacity in the left lung base. No sizable pleural effusion or pneumothorax is identified. Residual contrast is noted in the splenic flexure of the colon.  IMPRESSION: 1. Patchy bibasilar opacities, new on the left and suspicious for pneumonia or aspiration. 2. Unchanged right upper lobe mass.   Electronically Signed   By: Logan Bores   On: 03/28/2015 19:01   Dg Swallowing Func-speech Pathology  03/27/2015    Objective Swallowing  Evaluation:    Patient Details  Name: Stephen Ellis MRN: [id number] Date of Birth: [birth year]  Today's Date: 03/27/2015 Time: SLP Start Time (ACUTE ONLY): 1350-SLP Stop Time (ACUTE ONLY): 1415 SLP Time Calculation (min) (ACUTE ONLY): 25 min  Past Medical History:  Past Medical History  Diagnosis Date  . Syncope   .  Dehydration   . Blind left eye   . Hypertension   . Anemia     "low Blood"  . Dementia     poor memory  . Lung cancer 08/12/14    Right lung   Past Surgical History:  Past Surgical History  Procedure Laterality Date  . Video bronchoscopy Bilateral 07/29/2014    Procedure: VIDEO BRONCHOSCOPY WITHOUT FLUORO;  Surgeon: Tanda Rockers,  MD;  Location: [facility] ENDOSCOPY;  Service: Cardiopulmonary;  Laterality:  Bilateral;  . Video bronchoscopy Bilateral 08/12/2014    Procedure: VIDEO BRONCHOSCOPY WITHOUT FLUORO;  Surgeon: Wilhelmina Mcardle,  MD;  Location: [facility] ENDOSCOPY;  Service: Cardiopulmonary;  Laterality:  Bilateral;   HPI:  Other Pertinent Information: Patient is a 69 year old man admitted after  vomiting dark material with concern for aspiration.  PMH + with history of  lung cancer status post chemotherapy in April and radiation that finished  in May with a history of postobstructive pneumonia and difficulty  swallowing who presents to the hospital with c/o increased difficulty  swallowing and lethargy. CXR shows residual or recurrent RLL PNA.  CT  chest indicated 1. No evidence of pulmonary embolus. 2. Recurrent patchy  opacities at the right lower lung lobe, with associated right pleural  effusion concerning for pna,  Mildly enlarged azygoesophageal recess node  is concerning for metastatic disease. Partial extension of mass into the  right paratracheal region.    No Data Recorded  Assessment / Plan / Recommendation CHL IP CLINICAL IMPRESSIONS 03/27/2015  Therapy Diagnosis Severe oral phase dysphagia;Moderate pharyngeal phase  dysphagia  Clinical Impression Pt demonstrates a moderate to severe oral dysphagia  with  limited attempts to transit solid textures and slight lingual pumping  for liquid transit. Pt able to follow cues for slight posterior head tilt  to transit liquid boluses (though this was not necessary at bedside).  Swallow initiation is delayed but no penetration or aspiration occurred  with large or small boluses, though small boluses are certainly preferred  to reduce risk of penetration. A head tilt with a second swallow is also  helpful to transit oral residuals. Oropharyngeal mobility, strength and  structures were WNL under fluoroscopy. Recommend full liquid texture with  ongoing puree trials with therapy to advance diet. Pt would not manipulate  whole pill and struggled with puree texture, so pills crushed in liquids  or maybe ice cream would be worth trying. Given severity of deficits,  adequate, consistent PO and medication consumption will be difficult.       CHL IP TREATMENT RECOMMENDATION 03/27/2015  Treatment Recommendations Therapy as outlined in treatment plan below     CHL IP DIET RECOMMENDATION 03/27/2015  SLP Diet Recommendations Thin  Liquid Administration via (None)  Medication Administration Crushed with puree  Compensations Slow rate;Small sips/bites;Check for anterior loss  Postural Changes and/or Swallow Maneuvers (None)     CHL IP OTHER RECOMMENDATIONS 03/27/2015  Recommended Consults (None)  Oral Care Recommendations Oral care BID  Other Recommendations Have oral suction available     CHL IP FOLLOW UP RECOMMENDATIONS 03/16/2015  Follow up Recommendations Other (comment)     CHL IP FREQUENCY AND DURATION 03/27/2015  Speech Therapy Frequency (ACUTE ONLY) min 2x/week  Treatment Duration 2 weeks     Pertinent Vitals/Pain NA    SLP Swallow Goals No flowsheet data found.  No flowsheet data found.    CHL IP REASON FOR REFERRAL 03/27/2015  Reason for Referral Objectively evaluate swallowing function  No flowsheet data found.  No flowsheet data found.        Herbie [place], [place] CCC-SLP  [phone]  DeBlois, Katherene Ponto 03/27/2015, 2:30 PM     Impression:  The patient is a very nice 69 year old gentleman with metastatic neuroendocrine lung cancer found to have new brain metastases. His disease within the chest has proven to be relatively radial responsive and he and his family have been compliant with ambulatory radiation therapy. In this setting, whole brain radiotherapy could be considered in order to help preserve neurologic function and maintain current quality of life.  Alternatively, it would be reasonable to consider hospice care.  Plan:  Today, I talked to the patient about the findings and work-up thus far.  His understanding was quite limited.  We discussed the natural history of brain metastases and general treatment, highlighting the role of radiotherapy in the management.  We discussed logistics and delivery.  We reviewed the anticipated acute and late sequelae associated with radiation in this setting.    I would like to talk further with the patient's family.  If he is stable for discharge and interested in whole brain radiotherapy, we can simulate on 7/28 as an inpatient and proceed with outpatient treatments for 2 weeks.  _____________________________________  Sheral Apley Tammi Klippel, M.D.

## 2015-04-01 NOTE — Progress Notes (Addendum)
During rounds, chaplain visited patient and nephew. Chaplain provided a listening presence and nephew asked chaplain for prayer. Chaplain prayed a prayer of comfort and healing while nephew prayed for a miracle. Nephew thanked chaplain for the visit and support. Chaplain services are available.   04/01/15 1400  Clinical Encounter Type  Visited With Patient and family together  Visit Type Initial;Spiritual support;Social support  Referral From Palliative care team

## 2015-04-01 NOTE — Progress Notes (Signed)
Spiritual care following for support around progression of illness / palliative.  Family not present upon chaplain arrival.  Will follow up throughout shift in attempt to make contact with family.

## 2015-04-01 NOTE — Progress Notes (Signed)
[facility] PROGRESS NOTE  MALVERN KADLEC OAC:[id number] DOB: 1945/11/20 DOA: 03/24/2015 PCP: Elizabeth Palau, MD  Brief Summary  69 year old male with lung cancer with neuroendocrine diff, plasma cell dyscrasia, HTN, dementia and anemia presented to the ED on 03/24/15 for vomiting. He has been treated multiple times in the past few months for aspiration and post-obstructive pneumonia. His most recent treatments of chemotherapy and radiation for lung cancer were in May 2016.  Patient is non-verbal and per family he has had a wet cough x 3 weeks, has been vomiting the past few days and has been non-verbal for the past 3-4 weeks with no known falls. Speech therapy has been involved.     Assessment/Plan  Pneumonia, aspiration versus postobstructive - Patient is afebrile currently no tachycardia, tachypnea or elevated WBC although does have cancer history  - Has been afebrile since 03/25/15 - On Zosyn day 9 of 10 - Chest CT: Consistent with spread of cancer although patient does have a history of postobstructive and aspiration pneumonia - Blood cultures pending 7/24, NGTD - Symbicort BID, duoneb PRN  Dysphagia, with ongoing aspiration -  Palliative diet -  Appreciate Dr. 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 Wangerin level of illness and extremely poor prognosis. - Appreciate Dr. Earlie Server, Dr. Hilma Favors, and Dr. Johny Shears assistance - His prognosis is VERY GUARDED and although  he is a full code, it would be harmful to aggressively resuscitate Mr cowles if a life threatenining event occurred  - Per brother and nephew, change in baseline three-four weeks prior to admission - Generalized right sided weakness, more weakness in right upper extremity than right lower extremity, though weakness has improved considerably from admission  Prior plasma cell dyscrasia/myeloma - Defer to oncology  Acute Kidney Injury, resolving with IVF -  Continue IVF -  Repeat labs in AM  Hypertension, BP elevated  - Continue amlodipine 10 mg daily  Generalized weakness due to metastatic cancer -  Right sided weakness improving with steroids -  PT/OT recommending SNF  Code Status: full Family Communication: patient alone Disposition Plan:  Awaiting Dr. Tammi Klippel recommendations regarding palliative XRT   Consultants:  Palliative  SLP  Antibiotics:  Vanc 03/24/15 >> 03/27/15  Zosyn 03/24/15 >>   HPI/Subjective:  Nods and shakes head to answer questions:  Denies pain, SOB, cough, nausea, vomiting, diarrhea.  Objective: Filed Vitals:   03/31/15 2207 04/01/15 0510 04/01/15 1050 04/01/15 1450  BP: 157/87 136/97 150/118 143/110  Pulse: 50 78  93  Temp: 98.2 F (36.8 C) 97.6 F (36.4 C)  97.7 F (36.5 C)  TempSrc: Oral Oral  Oral  Resp: $Remo'18 19  24  'YmyHQ$ Height:      Weight:      SpO2: 98% 99%  100%    Intake/Output Summary (Last 24 hours) at 04/01/15 1748 Last data filed at 04/01/15 1100  Gross per 24 hour  Intake   2565 ml  Output   1625 ml  Net    940 ml   Filed  Weights   03/25/15 0625  Weight: 68.6 kg (151 lb 3.8 oz)   Body mass index is 23 kg/(m^2).  Exam:   General:  Thin adult male, No acute distress, unable to form words, symmetric palate  HEENT:  NCAT, MMM  Cardiovascular:  RRR, nl S1, S2 no mrg, 2+ pulses, warm extremities  Respiratory:  Rhonchorous cough, no increased WOB  Abdomen:   NABS, soft, NT/ND  MSK:   Normal tone and bulk, no  LEE  Neuro:  Diffusely weak, grossly moves all extremities  Data Reviewed: Basic Metabolic Panel:  Recent Labs Lab 03/26/15 0426 03/28/15 0540 03/29/15 0534 03/30/15 0606  NA 133* 133* 139 136  K 3.5 5.0 3.9 4.0  CL 99* 102 104 106  CO2 23 20* 27 21*  GLUCOSE 95 87 84 77  BUN 16 37* 30* 26*  CREATININE 1.53* 2.74* 2.25* 1.84*  CALCIUM 9.5 9.8 10.1 9.6   Liver Function Tests:  Recent Labs Lab 03/26/15 0426 03/30/15 0606  AST 17 20  ALT 9* 12*  ALKPHOS 43 34*  BILITOT 0.9 0.6  PROT 8.3* 8.3*  ALBUMIN 2.4* 2.5*   No results for input(s): LIPASE, AMYLASE in the last 168 hours. No results for input(s): AMMONIA in the last 168 hours. CBC:  Recent Labs Lab 03/26/15 0426 03/29/15 0534 03/30/15 0606  WBC 9.7 3.5* 4.9  NEUTROABS 7.9*  --   --   HGB 10.4* 11.2* 10.6*  HCT 30.7* 33.7* 31.5*  MCV 92.5 92.6 92.9  PLT 210 215 229    Recent Results (from the past 240 hour(s))  Blood culture (routine x 2)     Status: None   Collection Time: 03/24/15 11:51 PM  Result Value Ref Range Status   Specimen Description BLOOD LEFT FOREARM  Final   Special Requests BOTTLES DRAWN AEROBIC AND ANAEROBIC 5ML  Final   Culture   Final    NO GROWTH 5 DAYS Performed at [facility]    Report Status 03/30/2015 FINAL  Final  Blood culture (routine x 2)     Status: None   Collection Time: 03/25/15 12:00 AM  Result Value Ref Range Status   Specimen Description BLOOD LEFT HAND  Final   Special Requests BOTTLES DRAWN AEROBIC ONLY 4ML  Final   Culture   Final    NO GROWTH 5 DAYS Performed at [facility]    Report Status 03/30/2015 FINAL  Final  MRSA PCR Screening     Status: None   Collection Time: 03/25/15  8:56 AM  Result Value Ref Range Status   MRSA by PCR NEGATIVE NEGATIVE Final    Comment:        The GeneXpert MRSA Assay (FDA approved for NASAL specimens only), is one component of a comprehensive MRSA colonization surveillance program. It is not intended  to diagnose MRSA infection nor to guide or monitor treatment for MRSA infections.   Culture, blood (routine x 2)     Status: None   Collection Time: 03/25/15  2:25 PM  Result Value Ref Range Status   Specimen Description BLOOD LEFT HAND  Final   Special Requests BOTTLES DRAWN AEROBIC AND ANAEROBIC  10CC  Final   Culture   Final    NO GROWTH 5 DAYS Performed at [facility]    Report Status 03/30/2015 FINAL  Final  Culture, blood (routine x 2)     Status: None   Collection Time: 03/25/15  2:30 PM  Result Value Ref Range  Status   Specimen Description BLOOD RIGHT ANTECUBITAL  Final   Special Requests BOTTLES DRAWN AEROBIC AND ANAEROBIC  10CC  Final   Culture   Final    NO GROWTH 5 DAYS Performed at [facility]    Report Status 03/30/2015 FINAL  Final     Studies: No results found.  Scheduled Meds: . amLODipine  10 mg Oral Daily  . antiseptic oral rinse  7 mL Mouth Rinse q12n4p  . budesonide-formoterol  2 puff Inhalation BID  . chlorhexidine  15 mL Mouth Rinse BID  . [START ON 04/02/2015] dexamethasone  4 mg Oral Daily  . dronabinol  2.5 mg Oral BID AC  . heparin  5,000 Units Subcutaneous 3 times per day  . piperacillin-tazobactam (ZOSYN)  IV  3.375 g Intravenous Q8H   Continuous Infusions: . sodium chloride 75 mL/hr at 04/01/15 1250    Active Problems:   Postobstructive pneumonia   Essential hypertension   Bronchogenic carcinoma of right lung   Pneumonia   Lung cancer, main bronchus   Aphonia   Brain metastasis   Dysphagia    Time spent: 30 min    Emmarie Sannes, [facility] Pager [phone]. If 7PM-7AM, please contact night-coverage at [email or web address], password [facility] 04/01/2015, 5:48 PM  LOS: 7 days

## 2015-04-01 NOTE — Progress Notes (Signed)
ANTIBIOTIC CONSULT NOTE - Follow up  Pharmacy Consult for Zosyn Indication: HCAP  No Known Allergies  Patient Measurements: Height: '5\' 8"'$  (172.7 cm) Weight: 151 lb 3.8 oz (68.6 kg) IBW/kg (Calculated) : 68.4  Vital Signs: Temp: 97.6 F (36.4 C) (07/27 0510) Temp Source: Oral (07/27 0510) BP: 150/118 mmHg (07/27 1050) Pulse Rate: 78 (07/27 0510) Intake/Output from previous day: 07/26 0701 - 07/27 0700 In: 2445 [P.O.:180; I.V.:2265] Out: 1300 [Urine:1300] Intake/Output from this shift: Total I/O In: -  Out: 325 [Urine:325]  Labs:  Recent Labs  03/30/15 0606  WBC 4.9  HGB 10.6*  PLT 229  CREATININE 1.84*   Estimated Creatinine Clearance: 37.7 mL/min (by C-G formula based on Cr of 1.84). No results for input(s): VANCOTROUGH, VANCOPEAK, VANCORANDOM, GENTTROUGH, GENTPEAK, GENTRANDOM, TOBRATROUGH, TOBRAPEAK, TOBRARND, AMIKACINPEAK, AMIKACINTROU, AMIKACIN in the last 72 hours.   Microbiology: Recent Results (from the past 720 hour(s))  Culture, blood (routine x 2)     Status: None   Collection Time: 03/12/15  6:00 PM  Result Value Ref Range Status   Specimen Description BLOOD LEFT ANTECUBITAL  Final   Special Requests BOTTLES DRAWN AEROBIC AND ANAEROBIC 5CC EACH  Final   Culture   Final    NO GROWTH 5 DAYS Performed at [facility]    Report Status 03/17/2015 FINAL  Final  Culture, blood (routine x 2)     Status: None   Collection Time: 03/12/15  6:30 PM  Result Value Ref Range Status   Specimen Description BLOOD RIGHT ANTECUBITAL  Final   Special Requests BOTTLES DRAWN AEROBIC AND ANAEROBIC 5ML  Final   Culture   Final    NO GROWTH 5 DAYS Performed at [facility]    Report Status 03/17/2015 FINAL  Final  Urine culture     Status: None   Collection Time: 03/12/15  8:24 PM  Result Value Ref Range Status   Specimen Description URINE, CATHETERIZED  Final   Special Requests NONE  Final   Culture   Final    NO GROWTH 2 DAYS Performed at [facility]    Report Status 03/14/2015 FINAL  Final  MRSA PCR Screening     Status: None   Collection Time: 03/13/15  1:35 AM  Result Value Ref Range Status   MRSA by PCR NEGATIVE NEGATIVE Final    Comment:        The GeneXpert MRSA Assay (FDA approved for NASAL specimens only), is one component of a comprehensive MRSA colonization surveillance program. It is not intended to diagnose MRSA infection nor to guide or monitor treatment for MRSA infections.   Culture, Urine     Status: None   Collection Time: 03/16/15  3:04 PM  Result Value Ref Range Status   Specimen Description URINE, RANDOM  Final   Special Requests NONE  Final   Culture   Final    NO GROWTH 2 DAYS Performed at [facility]    Report Status 03/18/2015 FINAL  Final  Blood culture (routine x 2)     Status: None   Collection Time: 03/24/15 11:51 PM  Result Value Ref Range Status   Specimen Description BLOOD LEFT FOREARM  Final   Special Requests BOTTLES DRAWN AEROBIC AND ANAEROBIC 5ML  Final   Culture   Final    NO GROWTH 5 DAYS Performed at [facility]    Report Status 03/30/2015 FINAL  Final  Blood culture (routine x 2)     Status:  None   Collection Time: 03/25/15 12:00 AM  Result Value Ref Range Status   Specimen Description BLOOD LEFT HAND  Final   Special Requests BOTTLES DRAWN AEROBIC ONLY 4ML  Final   Culture   Final    NO GROWTH 5 DAYS Performed at [facility]    Report Status 03/30/2015 FINAL  Final  MRSA PCR Screening     Status: None   Collection Time: 03/25/15  8:56 AM  Result Value Ref Range Status   MRSA by PCR NEGATIVE NEGATIVE Final    Comment:        The GeneXpert MRSA Assay (FDA approved for NASAL specimens only), is one component of a comprehensive MRSA colonization surveillance program. It is not intended to diagnose MRSA infection nor to guide or monitor treatment for MRSA infections.   Culture, blood (routine x 2)     Status: None   Collection  Time: 03/25/15  2:25 PM  Result Value Ref Range Status   Specimen Description BLOOD LEFT HAND  Final   Special Requests BOTTLES DRAWN AEROBIC AND ANAEROBIC  10CC  Final   Culture   Final    NO GROWTH 5 DAYS Performed at [facility]    Report Status 03/30/2015 FINAL  Final  Culture, blood (routine x 2)     Status: None   Collection Time: 03/25/15  2:30 PM  Result Value Ref Range Status   Specimen Description BLOOD RIGHT ANTECUBITAL  Final   Special Requests BOTTLES DRAWN AEROBIC AND ANAEROBIC  10CC  Final   Culture   Final    NO GROWTH 5 DAYS Performed at [facility]    Report Status 03/30/2015 FINAL  Final   Assessment: 69 y.o. male with lung cancer treated with radiation admitted 03/24/2015 for pneumonia.  Recently admitted and treated for aspiration pneumonia earlier this month.  CT chest reveals recurrent RLL opacities concerning for PNA.  To begin vancomycin and Zosyn per pharmacy.  First doses given in ED.  Noted to have received vancomycin during Dec 2015 (was 10 kg heavier at that time)  7/20 >> Vanc >> 7/22 7/20 >> Zosyn >>    7/20 CXR w/ recurrent patchy opacities RLL 7/23: CXR + new patchy bibasilar opacities concerning for asp on left side  7/19 blood: ngtd 7/20 blood: ngtd MRSA nasal swab: neg  Goal of Therapy:  Vancomycin trough level 15-20 mcg/ml  Eradication of infection Appropriate antibiotic dosing for indication and renal function  Plan:  Day#8 Zosyn 3.375g IV given every 8 hrs by 4-hr infusion. Monitor cx data, renal function, and patient progress Duration of therapy per MD - d/c antibiotics once appropriate  Netta Cedars, PharmD, BCPS Pager: [phone]  04/01/2015,11:17 AM.

## 2015-04-02 ENCOUNTER — Ambulatory Visit: Payer: Medicare HMO | Admitting: Radiation Oncology

## 2015-04-02 ENCOUNTER — Ambulatory Visit
Admit: 2015-04-02 | Discharge: 2015-04-02 | Disposition: A | Discharge status: Home / Self Care | Payer: Medicare HMO | Attending: Radiation Oncology | Admitting: Radiation Oncology

## 2015-04-02 ENCOUNTER — Telehealth: Payer: Self-pay | Admitting: Internal Medicine

## 2015-04-02 DIAGNOSIS — C7931 Secondary malignant neoplasm of brain: Secondary | ICD-10-CM

## 2015-04-02 DIAGNOSIS — I10 Essential (primary) hypertension: Secondary | ICD-10-CM

## 2015-04-02 LAB — CBC
HCT: 32.8 % — ABNORMAL LOW (ref 39.0–52.0)
HEMOGLOBIN: 10.9 g/dL — AB (ref 13.0–17.0)
MCH: 30.9 pg (ref 26.0–34.0)
MCHC: 33.2 g/dL (ref 30.0–36.0)
MCV: 92.9 fL (ref 78.0–100.0)
PLATELETS: 219 10*3/uL (ref 150–400)
RBC: 3.53 MIL/uL — AB (ref 4.22–5.81)
RDW: 12.7 % (ref 11.5–15.5)
WBC: 4.1 10*3/uL (ref 4.0–10.5)

## 2015-04-02 LAB — BASIC METABOLIC PANEL
Anion gap: 7 (ref 5–15)
BUN: 19 mg/dL (ref 6–20)
CHLORIDE: 101 mmol/L (ref 101–111)
CO2: 25 mmol/L (ref 22–32)
CREATININE: 1.51 mg/dL — AB (ref 0.61–1.24)
Calcium: 9.6 mg/dL (ref 8.9–10.3)
GFR calc Af Amer: 53 mL/min — ABNORMAL LOW (ref >60)
GFR calc non Af Amer: 46 mL/min — ABNORMAL LOW (ref >60)
Glucose, Bld: 109 mg/dL — ABNORMAL HIGH (ref 65–99)
POTASSIUM: 3.6 mmol/L (ref 3.5–5.1)
Sodium: 133 mmol/L — ABNORMAL LOW (ref 135–145)

## 2015-04-02 MED ORDER — VITAMIN C 500 MG PO TABS
250.0000 mg | ORAL_TABLET | Freq: Every day | ORAL | Status: DC
  Administered 2015-04-02: 250 mg via ORAL
  Filled 2015-04-02 (×2): qty 0.5

## 2015-04-02 MED ORDER — SACCHAROMYCES BOULARDII 250 MG PO CAPS
250.0000 mg | ORAL_CAPSULE | Freq: Two times a day (BID) | ORAL | Status: DC
  Administered 2015-04-02 – 2015-04-03 (×2): 250 mg via ORAL
  Filled 2015-04-02 (×3): qty 1

## 2015-04-02 NOTE — Telephone Encounter (Signed)
Faxed pt medical records to [facility]. Requested scans to be fedex'ed to [facility]

## 2015-04-02 NOTE — Progress Notes (Addendum)
Daily Progress Note   Patient Name: Stephen Ellis       Date: 04/02/2015 DOB: 07/10/1946  Age: 69 y.o. MRN#: [id number] Attending Physician: Janece Canterbury, MD Primary Care Physician: Elizabeth Palau, MD Admit Date: 03/24/2015  Reason for Consultation/Follow-up: Establishing goals of care, Family-clinician negotiation, Non pain symptom management and Psychosocial/spiritual support  Subjective:     -spoke with Stephen Ellis by telephone/Durable Power of Attorney, there is no HPOA documented, and then spoke with patient's sister Stephen Ellis by telephone.  Detailed conversation with both regarding diagnosis, prognosis, values and goals of care important to patient and his family.  Stephen Ellis voices thoughts and feeling  " we believe in miracles, we will not give up" and is requesting that "no negativity " be voiced in the patient's room  -discussed natural trajectory and expectations  at EOL, questions and concerns addressed    Length of Stay: 8 days  Current Medications: Scheduled Meds:  . amLODipine  10 mg Oral Daily  . antiseptic oral rinse  7 mL Mouth Rinse q12n4p  . budesonide-formoterol  2 puff Inhalation BID  . chlorhexidine  15 mL Mouth Rinse BID  . dexamethasone  4 mg Oral Daily  . dronabinol  2.5 mg Oral BID AC  . enoxaparin (LOVENOX) injection  40 mg Subcutaneous Q24H  . piperacillin-tazobactam (ZOSYN)  IV  3.375 g Intravenous Q8H  . saccharomyces boulardii  250 mg Oral BID  . vitamin C  250 mg Oral Daily    Continuous Infusions: . dextrose 5 % and 0.45 % NaCl with KCl 20 mEq/L 75 mL/hr at 04/02/15 0937    PRN Meds: ipratropium-albuterol, LORazepam, promethazine, senna-docusate  Palliative Performance Scale: 20 % at best     Vital Signs: BP 138/90 mmHg  Pulse 68  Temp(Src) 97.9 F (36.6 C) (Oral)  Resp 18  Ht '5\' 8"'$  (1.727 m)  Wt 68.6 kg (151 lb 3.8 oz)  BMI 23.00 kg/m2  SpO2 98% SpO2: SpO2: 98 % O2 Device: O2 Device: Not Delivered O2 Flow  Rate:    Intake/output summary:  Intake/Output Summary (Last 24 hours) at 04/02/15 1554 Last data filed at 04/02/15 1536  Gross per 24 hour  Intake 2341.25 ml  Output   1550 ml  Net 791.25 ml   LBM: Last BM Date: 04/01/15 Baseline Weight: Weight: 68.6 kg (151 lb 3.8 oz) Most recent weight: Weight: 68.6 kg (151 lb 3.8 oz)  Physical Exam:              General: chronically ill appearing,  HEENT: moist buccal membranes CVS: RRR Resp: decreased in bases Skin: warm and dry Neuro: eyes open and tracks, nod head to communicate yes/no   Additional Data Reviewed: Recent Labs     04/02/15  0444  WBC  4.1  HGB  10.9*  PLT  219  NA  133*  BUN  19  CREATININE  1.51*     Problem List:  Patient Active Problem List   Diagnosis Date Noted  . Brain metastasis 03/30/2015  . Dysphagia 03/30/2015  . Aphonia 03/25/2015  . Malnutrition of moderate degree 03/14/2015  . Aspiration pneumonia 03/13/2015  . CKD (chronic kidney disease) stage 3, GFR 30-59 ml/min 03/13/2015  . ARF (acute renal failure) 03/13/2015  . Lung cancer, main bronchus 01/04/2015  . Pneumonia 12/26/2014  . AKI (acute kidney injury) 09/20/2014  . Acute respiratory failure with hypoxia 09/06/2014  . Anemia of chronic disease 09/06/2014  . Leukopenia 09/06/2014  . HCAP (healthcare-associated  pneumonia) 09/01/2014  . Essential hypertension 09/01/2014  . Bronchogenic carcinoma of right lung   . Postobstructive pneumonia 08/07/2014  . Plasma cell dyscrasia 04/19/2012     Palliative Care Assessment & Plan    Code Status:  Full code-strongly recommended a DNR/DNI status  Goals of Care:  Family is open to all offered and available medical interventions to prolong life.    5. Prognosis: < 6 months, likely less  5. Discharge Planning: Pending outcomes, if he stabilizes back to SNF    Thank you for allowing the Palliative Medicine Team to assist in the care of this patient.   Time In:  1525 Time Out:  1600 Total Time 35 min Prolonged Time Billed  no     Greater than 50%  of this time was spent counseling and coordinating care related to the above assessment and plan.     Knox Royalty, NP  04/02/2015, 3:54 PM  Please contact Palliative Medicine Team phone at [phone] for questions and concerns.

## 2015-04-02 NOTE — Progress Notes (Addendum)
[facility] PROGRESS NOTE  Stephen Ellis YKD:[id number] DOB: Apr 15, 1946 DOA: 03/24/2015 PCP: Elizabeth Palau, MD  Brief Summary  69 year old male with lung cancer with neuroendocrine diff, plasma cell dyscrasia, HTN, dementia and anemia presented to the ED on 03/24/15 for vomiting. He has been treated multiple times in the past few months for aspiration and post-obstructive pneumonia. His most recent treatments of chemotherapy and radiation for lung cancer were in May 2016.  Per family he has had a wet cough x 3 weeks, has been vomiting the past few days and has been non-verbal for the past 3-4 weeks with no known falls. Speech therapy has been involved.    Assessment/Plan  Pneumonia, aspiration versus postobstructive - Patient is afebrile currently no tachycardia, tachypnea or elevated WBC although does have cancer history  - Has been afebrile since 03/25/15 - On Zosyn day 9 of 10 - Chest CT: Consistent with spread of cancer although patient does have a history of postobstructive and aspiration pneumonia - Blood cultures pending 7/24, NGTD - Symbicort BID, duoneb PRN  Dysphagia, with ongoing aspiration, minimal PO intake -  Palliative diet -  Appreciate Dr. 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 Earlie Server, Dr. Hilma Favors, and Dr. Johny Shears assistance - His prognosis is VERY GUARDED and family is having a difficult time understanding that his treatments are palliative only  Prior plasma cell dyscrasia/myeloma - Defer to oncology  Acute Kidney Injury, resolving with IVF -  Continue IVF -  Repeat labs in AM  Hypertension, BP elevated  - Continue amlodipine 10 mg daily  Generalized weakness due to metastatic cancer -  Right sided weakness improving with steroids -  PT/OT recommending SNF -  Palliative care to follow at SNF   Code Status: full Family Communication: patient and nephew at length today.  Encouraged XRT as soon as possible and increasing amount patient eats Disposition Plan:  Awaiting Dr. Tammi Klippel recommendations regarding palliative XRT   Consultants:    Palliative  SLP  Antibiotics:  Vanc 03/25/15 >> 03/27/15  Zosyn 03/25/15 >> 7/29  HPI/Subjective:  Nods and shakes head to answer questions again today:  Denies pain, SOB, cough, nausea, vomiting, diarrhea.  Objective: Filed Vitals:   04/01/15 2051 04/02/15 0511 04/02/15 0726 04/02/15 0929  BP: 144/79 143/98  138/90  Pulse: 73 68    Temp: 98.1 F (36.7 C) 97.9 F (36.6 C)    TempSrc: Oral Oral    Resp: 18 18    Height:      Weight:      SpO2: 99% 100% 98%     Intake/Output Summary (Last 24 hours) at 04/02/15 1356 Last data filed at 04/02/15 [phone]  Gross per 24 hour  Intake 2311.25 ml  Output   1350 ml  Net 961.25 ml   Filed Weights   03/25/15 0625  Weight: 68.6 kg (151 lb 3.8 oz)   Body mass index is 23 kg/(m^2).  Exam:   General:  Thin adult male, No acute distress, unable to form words, symmetric palate, copious clear/white secretions in the canister  HEENT:  NCAT, MMM  Cardiovascular:  RRR, nl S1, S2 no mrg, 2+ pulses, warm extremities  Respiratory:  Rhonchorous cough, no increased  WOB  Abdomen:   NABS, soft, NT/ND  MSK:   Normal tone and bulk, no LEE  Neuro:  Diffusely weak, grossly moves all extremities,5-/5 right upper and lower extremity compared to 5/5 left upper and lower extremities  Data Reviewed: Basic Metabolic Panel:  Recent Labs Lab 03/28/15 0540 03/29/15 0534 03/30/15 0606 04/02/15 0444  NA 133* 139 136 133*  K 5.0 3.9 4.0 3.6  CL 102 104 106 101  CO2 20* 27 21* 25  GLUCOSE 87 84 77 109*  BUN 37* 30* 26* 19  CREATININE 2.74* 2.25* 1.84* 1.51*  CALCIUM 9.8 10.1 9.6 9.6   Liver Function Tests:  Recent Labs Lab 03/30/15 0606  AST 20  ALT 12*  ALKPHOS 34*  BILITOT 0.6  PROT 8.3*  ALBUMIN 2.5*   No results for input(s): LIPASE, AMYLASE in the last 168 hours. No results for input(s): AMMONIA in the last 168 hours. CBC:  Recent Labs Lab 03/29/15 0534 03/30/15 0606 04/02/15 0444  WBC 3.5* 4.9 4.1  HGB 11.2* 10.6* 10.9*  HCT 33.7* 31.5* 32.8*  MCV 92.6 92.9 92.9  PLT 215 229 219    Recent Results (from the past 240 hour(s))  Blood culture (routine x 2)     Status: None   Collection Time: 03/24/15 11:51 PM  Result Value Ref Range Status   Specimen Description BLOOD LEFT FOREARM  Final   Special Requests BOTTLES DRAWN AEROBIC AND ANAEROBIC 5ML  Final   Culture   Final    NO GROWTH 5 DAYS Performed at [facility]    Report Status 03/30/2015 FINAL  Final  Blood culture (routine x 2)     Status: None   Collection Time: 03/25/15 12:00 AM  Result Value Ref Range Status   Specimen Description BLOOD LEFT HAND  Final   Special Requests BOTTLES DRAWN AEROBIC ONLY 4ML  Final   Culture   Final    NO GROWTH 5 DAYS Performed at [facility]    Report Status 03/30/2015 FINAL  Final  MRSA PCR Screening     Status: None   Collection Time: 03/25/15  8:56 AM  Result Value Ref Range Status   MRSA by PCR NEGATIVE NEGATIVE Final    Comment:        The GeneXpert MRSA Assay (FDA approved for NASAL specimens only), is  one component of a comprehensive MRSA colonization surveillance program. It is not intended to diagnose MRSA infection nor to guide or monitor treatment for MRSA infections.   Culture, blood (routine x 2)     Status: None   Collection Time: 03/25/15  2:25 PM  Result Value Ref Range Status   Specimen Description BLOOD LEFT HAND  Final   Special Requests BOTTLES DRAWN AEROBIC AND ANAEROBIC  10CC  Final   Culture   Final    NO GROWTH 5 DAYS Performed at [facility]    Report Status 03/30/2015 FINAL  Final  Culture,  blood (routine x 2)     Status: None   Collection Time: 03/25/15  2:30 PM  Result Value Ref Range Status   Specimen Description BLOOD RIGHT ANTECUBITAL  Final   Special Requests BOTTLES DRAWN AEROBIC AND ANAEROBIC  10CC  Final   Culture   Final    NO GROWTH 5 DAYS Performed at [facility]    Report Status 03/30/2015 FINAL  Final     Studies: No results found.  Scheduled Meds: . amLODipine  10 mg Oral Daily  . antiseptic oral rinse  7 mL Mouth Rinse q12n4p  . budesonide-formoterol  2 puff Inhalation BID  . chlorhexidine  15 mL Mouth Rinse BID  . dexamethasone  4 mg Oral Daily  . dronabinol  2.5 mg Oral BID AC  . enoxaparin (LOVENOX) injection  40 mg Subcutaneous Q24H  . piperacillin-tazobactam (ZOSYN)  IV  3.375 g Intravenous Q8H  . saccharomyces boulardii  250 mg Oral BID  . vitamin C  250 mg Oral Daily   Continuous Infusions: . dextrose 5 % and 0.45 % NaCl with KCl 20 mEq/L 75 mL/hr at 04/02/15 3704    Active Problems:   Postobstructive pneumonia   Essential hypertension   Bronchogenic carcinoma of right lung   Pneumonia   Lung cancer, main bronchus   Aphonia   Brain metastasis   Dysphagia    Time spent: 30 min    , [place] [facility] Pager [phone]. If 7PM-7AM, please contact night-coverage at [email or web address], password [email or web address] 04/02/2015, 1:56 PM  LOS: 8 days

## 2015-04-02 NOTE — Progress Notes (Signed)
Spoke with patient POA by phone.  He confirmed that family is not interested in Hospice currently, but, he has concerned about patient current condition and whether whole brain radiotherapy would be tolerated.  He wants to await revocery from pneumonia prior to deciding.  I will be away from the office next week.  My partner Dr. Lisbeth Renshaw and our nurse, Val, will monitor progress.  If patient stabilizes and discharge is imminent, we will contact the POA again to discuss possible WBRT in outpatient setting.

## 2015-04-02 NOTE — Progress Notes (Signed)
Dr.Manning spoke with patient's POA Stephen Ellis regarding patient status and potential brain radiation.Plan for now is to let heal from pneumonia and check status daily.When patient ready for discharge Mr.Love will be notified to see if he wants to proceed with treatment by myself or Dr.Manning's nurse.Dr.Manning will inform Dr.Moody of plans.

## 2015-04-03 ENCOUNTER — Ambulatory Visit: Payer: Medicare HMO

## 2015-04-03 DIAGNOSIS — Z515 Encounter for palliative care: Secondary | ICD-10-CM

## 2015-04-03 DIAGNOSIS — Z7189 Other specified counseling: Secondary | ICD-10-CM

## 2015-04-03 LAB — CBC
HCT: 34 % — ABNORMAL LOW (ref 39.0–52.0)
HEMOGLOBIN: 11.4 g/dL — AB (ref 13.0–17.0)
MCH: 30.8 pg (ref 26.0–34.0)
MCHC: 33.5 g/dL (ref 30.0–36.0)
MCV: 91.9 fL (ref 78.0–100.0)
Platelets: 228 10*3/uL (ref 150–400)
RBC: 3.7 MIL/uL — ABNORMAL LOW (ref 4.22–5.81)
RDW: 12.7 % (ref 11.5–15.5)
WBC: 4 10*3/uL (ref 4.0–10.5)

## 2015-04-03 LAB — BASIC METABOLIC PANEL
Anion gap: 7 (ref 5–15)
BUN: 14 mg/dL (ref 6–20)
CALCIUM: 9.6 mg/dL (ref 8.9–10.3)
CO2: 26 mmol/L (ref 22–32)
Chloride: 99 mmol/L — ABNORMAL LOW (ref 101–111)
Creatinine, Ser: 1.4 mg/dL — ABNORMAL HIGH (ref 0.61–1.24)
GFR calc Af Amer: 59 mL/min — ABNORMAL LOW (ref >60)
GFR calc non Af Amer: 50 mL/min — ABNORMAL LOW (ref >60)
GLUCOSE: 110 mg/dL — AB (ref 65–99)
Potassium: 3.3 mmol/L — ABNORMAL LOW (ref 3.5–5.1)
Sodium: 132 mmol/L — ABNORMAL LOW (ref 135–145)

## 2015-04-03 MED ORDER — KCL IN DEXTROSE-NACL 40-5-0.45 MEQ/L-%-% IV SOLN
INTRAVENOUS | Status: DC
  Administered 2015-04-03 – 2015-04-04 (×2): via INTRAVENOUS
  Filled 2015-04-03 (×3): qty 1000

## 2015-04-03 MED ORDER — POTASSIUM CHLORIDE 10 MEQ/100ML IV SOLN
10.0000 meq | INTRAVENOUS | Status: AC
  Administered 2015-04-03 (×2): 10 meq via INTRAVENOUS
  Filled 2015-04-03 (×2): qty 100

## 2015-04-03 MED ORDER — ONDANSETRON HCL 4 MG/2ML IJ SOLN
4.0000 mg | Freq: Three times a day (TID) | INTRAMUSCULAR | Status: DC
  Administered 2015-04-03 – 2015-04-06 (×10): 4 mg via INTRAVENOUS
  Filled 2015-04-03 (×10): qty 2

## 2015-04-03 NOTE — Progress Notes (Signed)
  This NP will meet with family tomorrow at 1200 to continue [place] conversation.  Wadie Lessen NP

## 2015-04-03 NOTE — Progress Notes (Signed)
CSW continuing to follow.   CSW received update from MD that family meeting is planned tomorrow with pt family to discuss option for feeding tube. Per MD, pt family is having a difficult time understanding that his treatments are palliative only.   CSW contacted [facility] and updated facility. [employer] stated that facility will need updated PT notes for [facility] insurance when pt medically ready for discharge. CSW discussed with PT. Pt insurance [employer] requires authorization prior to pt return to [facility].  CSW to continue to follow to provide support and assist with pt disposition needs.  Alison Murray, MSW, [place] [employer] [phone]

## 2015-04-03 NOTE — Care Management Important Message (Signed)
Important Message  Patient Details  Name: Stephen Ellis MRN: [id number] Date of Birth: 1946/06/17   Medicare Important Message Given:  Yes-third notification given    Camillo Flaming 04/03/2015, 1:03 [place] Message  Patient Details  Name: Stephen Ellis MRN: [id number] Date of Birth: 26-Sep-1945   Medicare Important Message Given:  Yes-third notification given    Camillo Flaming 04/03/2015, 1:03 PM

## 2015-04-03 NOTE — Progress Notes (Signed)
[facility] PROGRESS NOTE  Stephen Ellis KGY:[id number] DOB: 03-Nov-1945 DOA: 03/24/2015 PCP: Elizabeth Palau, MD  Brief Summary  69 year old male with lung cancer with neuroendocrine diff, plasma cell dyscrasia, HTN, dementia and anemia presented to the ED on 03/24/15 for vomiting. He has been treated multiple times in the past few months for aspiration and post-obstructive pneumonia. His most recent treatments of chemotherapy and radiation for lung cancer were in May 2016.  Per family he has had a wet cough x 3 weeks, has been vomiting the past few days and has been non-verbal for the past 3-4 weeks with no known falls. Speech therapy has been involved.    Assessment/Plan  Pneumonia, aspiration versus postobstructive - Patient is afebrile currently no tachycardia, tachypnea or elevated WBC although does have cancer history  - Has been afebrile since 03/25/15 - completed 10 days of zosyn - Chest CT: Consistent with spread of cancer although patient does have a history of postobstructive and aspiration pneumonia - Blood cultures pending 7/24, NGTD - Symbicort BID, duoneb PRN  Dysphagia, with ongoing aspiration despite dysphagia 1 diet, minimal PO intake despite waiting several days on steroids -  Continue Palliative diet -  Proposed feeding tube to family and they will discuss and get back to [facility] from palliative care tomorrow at a family meeting around noon. -  Greatly appreciate palliative care assistance   Metastatic Lung Cancer, right hilum-currently stage IV disease with met to the brain causing right sided weakness, aphonia and dysphagia with aspiration, improving with steroids - Previously recently completed XRT to lung - CT scan of the head done 7/20 = metastatic 2 cm round mass in the left parietal region  - patient declined systemic chemotherapy in May 2015 - Phenergran PRN for nausea  - continue oral decadron  - Appreciate Dr. Earlie Server, Dr. Hilma Favors, and Dr.  Johny Shears assistance - His prognosis is VERY GUARDED and family is having a difficult time understanding that his treatments are palliative only.    Prior plasma cell dyscrasia/myeloma - Defer to oncology  Acute Kidney Injury, resolved with IVF  Hypokalemia, increase potassium in IVF  Hypertension, BP elevated  - Continue amlodipine 10 mg daily  Generalized weakness due to metastatic cancer -  Right sided weakness improving with steroids -  PT/OT recommending SNF -  Palliative care to follow at SNF   Code Status: full Family Communication: patient and nephew Disposition Plan:  Awaiting decision about palliative diet vs. Feeding tube   Consultants:    Palliative  SLP  Antibiotics:  Vanc 03/25/15 >> 03/27/15  Zosyn 03/25/15 >> 7/29  HPI/Subjective:  Nods and shakes head to answer questions again today:  Denies pain, SOB, diarrhea.  Having cough and some nausea.    Objective: Filed Vitals:   04/02/15 2205 04/03/15 0518 04/03/15 0831 04/03/15 1405  BP: 136/97 128/94  124/94  Pulse: 68 82  73  Temp: 98 F (36.7 C) 98.3 F (36.8 C)  98.1 F (36.7 C)  TempSrc: Oral Oral  Oral  Resp: 18   16  Height:      Weight:      SpO2: 100% 99% 95% 100%    Intake/Output Summary (Last 24 hours) at 04/03/15 1606 Last data filed at 04/03/15 1430  Gross per 24 hour  Intake  857.5 ml  Output    850 ml  Net    7.5 ml   Filed Weights   03/25/15 0625  Weight: 68.6 kg (151 lb 3.8 oz)  Body mass index is 23 kg/(m^2).  Exam:   General:  Thin adult male, No acute distress, able to make some noise to get my attention, but not articulate  HEENT:  NCAT, MMM  Cardiovascular:  RRR, nl S1, S2 no mrg, 2+ pulses, warm extremities  Respiratory:  Rhonchorous cough with wet sounds in back of throat, no increased WOB  Abdomen:   NABS, soft, NT/ND  MSK:   Normal tone and bulk, no LEE  Neuro:  Diffusely weak, grossly moves all extremities,5-/5 right upper and lower extremity  compared to 5/5 left upper and lower extremities  Data Reviewed: Basic Metabolic Panel:  Recent Labs Lab 03/28/15 0540 03/29/15 0534 03/30/15 0606 04/02/15 0444 04/03/15 0426  NA 133* 139 136 133* 132*  K 5.0 3.9 4.0 3.6 3.3*  CL 102 104 106 101 99*  CO2 20* 27 21* 25 26  GLUCOSE 87 84 77 109* 110*  BUN 37* 30* 26* 19 14  CREATININE 2.74* 2.25* 1.84* 1.51* 1.40*  CALCIUM 9.8 10.1 9.6 9.6 9.6   Liver Function Tests:  Recent Labs Lab 03/30/15 0606  AST 20  ALT 12*  ALKPHOS 34*  BILITOT 0.6  PROT 8.3*  ALBUMIN 2.5*   No results for input(s): LIPASE, AMYLASE in the last 168 hours. No results for input(s): AMMONIA in the last 168 hours. CBC:  Recent Labs Lab 03/29/15 0534 03/30/15 0606 04/02/15 0444 04/03/15 0426  WBC 3.5* 4.9 4.1 4.0  HGB 11.2* 10.6* 10.9* 11.4*  HCT 33.7* 31.5* 32.8* 34.0*  MCV 92.6 92.9 92.9 91.9  PLT 215 229 219 228    Recent Results (from the past 240 hour(s))  Blood culture (routine x 2)     Status: None   Collection Time: 03/24/15 11:51 PM  Result Value Ref Range Status   Specimen Description BLOOD LEFT FOREARM  Final   Special Requests BOTTLES DRAWN AEROBIC AND ANAEROBIC 5ML  Final   Culture   Final    NO GROWTH 5 DAYS Performed at [facility]    Report Status 03/30/2015 FINAL  Final  Blood culture (routine x 2)     Status: None   Collection Time: 03/25/15 12:00 AM  Result Value Ref Range Status   Specimen Description BLOOD LEFT HAND  Final   Special Requests BOTTLES DRAWN AEROBIC ONLY 4ML  Final   Culture   Final    NO GROWTH 5 DAYS Performed at [facility]    Report Status 03/30/2015 FINAL  Final  MRSA PCR Screening     Status: None   Collection Time: 03/25/15  8:56 AM  Result Value Ref Range Status   MRSA by PCR NEGATIVE NEGATIVE Final    Comment:        The GeneXpert MRSA Assay (FDA approved for NASAL specimens only), is one component of a comprehensive MRSA colonization surveillance program. It is  not intended to diagnose MRSA infection nor to guide or monitor treatment for MRSA infections.   Culture, blood (routine x 2)     Status: None   Collection Time: 03/25/15  2:25 PM  Result Value Ref Range Status   Specimen Description BLOOD LEFT HAND  Final   Special Requests BOTTLES DRAWN AEROBIC AND ANAEROBIC  10CC  Final   Culture   Final    NO GROWTH 5 DAYS Performed at [facility]    Report Status 03/30/2015 FINAL  Final  Culture, blood (routine x 2)     Status: None  Collection Time: 03/25/15  2:30 PM  Result Value Ref Range Status   Specimen Description BLOOD RIGHT ANTECUBITAL  Final   Special Requests BOTTLES DRAWN AEROBIC AND ANAEROBIC  10CC  Final   Culture   Final    NO GROWTH 5 DAYS Performed at [facility]    Report Status 03/30/2015 FINAL  Final     Studies: No results found.  Scheduled Meds: . amLODipine  10 mg Oral Daily  . antiseptic oral rinse  7 mL Mouth Rinse q12n4p  . budesonide-formoterol  2 puff Inhalation BID  . chlorhexidine  15 mL Mouth Rinse BID  . dexamethasone  4 mg Oral Daily  . dronabinol  2.5 mg Oral BID AC  . enoxaparin (LOVENOX) injection  40 mg Subcutaneous Q24H  . ondansetron (ZOFRAN) IV  4 mg Intravenous 3 times per day  . saccharomyces boulardii  250 mg Oral BID  . vitamin C  250 mg Oral Daily   Continuous Infusions: . dextrose 5 % and 0.45 % NaCl with KCl 40 mEq/L      Active Problems:   Postobstructive pneumonia   Essential hypertension   Bronchogenic carcinoma of right lung   Pneumonia   Lung cancer, main bronchus   Aphonia   Brain metastasis   Dysphagia   Palliative care encounter   DNR (do not resuscitate) discussion    Time spent: 30 min    Dameon Soltis, [place] [facility] Pager [phone]. If 7PM-7AM, please contact night-coverage at [email or web address], password [facility] 04/03/2015, 4:06 PM  LOS: 9 days

## 2015-04-03 NOTE — Progress Notes (Signed)
Chaplain attempted to visit patient to provide a presence and support  however; patient was resting . There was no family present at the bedside so    04/03/15 1500  Clinical Encounter Type  Visited With Patient  Visit Type Follow-up;Spiritual support;Social support  Referral From Palliative care team  chaplain did not disturb patient.  Chaplain will continue to follow.

## 2015-04-04 ENCOUNTER — Inpatient Hospital Stay (HOSPITAL_COMMUNITY): Payer: Medicare HMO

## 2015-04-04 LAB — HEPATIC FUNCTION PANEL
ALBUMIN: 2.2 g/dL — AB (ref 3.5–5.0)
ALT: 13 U/L — AB (ref 17–63)
AST: 18 U/L (ref 15–41)
Alkaline Phosphatase: 35 U/L — ABNORMAL LOW (ref 38–126)
TOTAL PROTEIN: 7.5 g/dL (ref 6.5–8.1)
Total Bilirubin: 0.3 mg/dL (ref 0.3–1.2)

## 2015-04-04 LAB — CBC
HCT: 31.6 % — ABNORMAL LOW (ref 39.0–52.0)
Hemoglobin: 10.8 g/dL — ABNORMAL LOW (ref 13.0–17.0)
MCH: 31.7 pg (ref 26.0–34.0)
MCHC: 34.2 g/dL (ref 30.0–36.0)
MCV: 92.7 fL (ref 78.0–100.0)
PLATELETS: 212 10*3/uL (ref 150–400)
RBC: 3.41 MIL/uL — AB (ref 4.22–5.81)
RDW: 13 % (ref 11.5–15.5)
WBC: 5.2 10*3/uL (ref 4.0–10.5)

## 2015-04-04 LAB — BASIC METABOLIC PANEL
ANION GAP: 6 (ref 5–15)
BUN: 11 mg/dL (ref 6–20)
CO2: 25 mmol/L (ref 22–32)
CREATININE: 1.48 mg/dL — AB (ref 0.61–1.24)
Calcium: 9.5 mg/dL (ref 8.9–10.3)
Chloride: 102 mmol/L (ref 101–111)
GFR calc Af Amer: 55 mL/min — ABNORMAL LOW (ref >60)
GFR calc non Af Amer: 47 mL/min — ABNORMAL LOW (ref >60)
GLUCOSE: 104 mg/dL — AB (ref 65–99)
Potassium: 3.8 mmol/L (ref 3.5–5.1)
Sodium: 133 mmol/L — ABNORMAL LOW (ref 135–145)

## 2015-04-04 LAB — LIPASE, BLOOD: LIPASE: 30 U/L (ref 22–51)

## 2015-04-04 MED ORDER — LORAZEPAM 2 MG/ML IJ SOLN: 1.0000 mg | INTRAMUSCULAR | Status: DC | PRN

## 2015-04-04 MED ORDER — BISACODYL 10 MG RE SUPP: 10.0000 mg | Freq: Every day | RECTAL | Status: DC | PRN

## 2015-04-04 MED ORDER — ONDANSETRON HCL 4 MG/2ML IJ SOLN
4.0000 mg | Freq: Four times a day (QID) | INTRAMUSCULAR | Status: DC | PRN
  Administered 2015-04-04: 4 mg via INTRAVENOUS
  Filled 2015-04-04: qty 2

## 2015-04-04 MED ORDER — DEXAMETHASONE SODIUM PHOSPHATE 4 MG/ML IJ SOLN
4.0000 mg | INTRAMUSCULAR | Status: DC
  Administered 2015-04-04 – 2015-04-06 (×3): 4 mg via INTRAVENOUS
  Filled 2015-04-04 (×3): qty 1

## 2015-04-04 MED ORDER — MORPHINE SULFATE 2 MG/ML IJ SOLN
1.0000 mg | INTRAMUSCULAR | Status: DC | PRN
  Administered 2015-04-05 (×2): 1 mg via INTRAVENOUS
  Filled 2015-04-04 (×2): qty 1

## 2015-04-04 NOTE — Progress Notes (Addendum)
[facility] PROGRESS NOTE  Stephen NEBEL UKG:[id number] DOB: [birth year] DOA: 03/24/2015 PCP: Elizabeth Palau, MD  Brief Summary  69 year old male with lung cancer with neuroendocrine diff, plasma cell dyscrasia, HTN, dementia and anemia presented to the ED on 03/24/15 for vomiting. He has been treated multiple times in the past few months for aspiration and post-obstructive pneumonia. His most recent treatments of chemotherapy and radiation for lung cancer were in May 2016.  Per family he has had a wet cough x 3 weeks, has been vomiting the past few days and has been non-verbal for the past 3-4 weeks with no known falls. Speech therapy has been involved.    Assessment/Plan  Pneumonia, aspiration versus postobstructive s/p 10 days of zosyn.  Ongoing aspiration. - Chest CT: Consistent with spread of cancer although patient does have a history of postobstructive and aspiration pneumonia - Blood cultures pending 7/24, NGTD  Nausea and vomiting, possible bowel obstruction from cancer -  CXR/KUB to screen for obstruction and recurrent aspiration PNA:  Worsened RLL PNA and gas-filled loops of small bowel without obvious obstruction  Dysphagia, with ongoing aspiration despite dysphagia 1 diet, minimal PO intake -  Continue Palliative diet -  Greatly appreciate palliative care assistance   Metastatic Lung Cancer, right hilum s/p XRT to lung and no longer candidate for chemotherapy, currently stage IV disease with met to the brain causing right sided weakness, aphonia and dysphagia with aspiration, improved somewhat with steroids - CT scan 7/20 demonstrated metastatic 2 cm round mass in the left parietal region  - Phenergran PRN for nausea  - continue oral decadron  - Appreciate Dr. Earlie Server and Dr. Johny Shears assistance - Greatly appreciate palliative care assistance:  Comfort measures only and anticipate [facility] on Monday  Prior plasma cell dyscrasia/myeloma - Defer to  oncology  Acute on chronic kidney disease, resolved with IVF and creatinine stable at 1.4, no further last  Hypokalemia, resolved after increasing potassium in IVF  Hypertension, BP elevated, d/c oral medications, comfort measures only  Generalized weakness due to metastatic cancer, transfer to [facility] on Monday  Code Status:   DNR Family Communication: patient alone.  Wadie Lessen from palliative care had a family meeting today to discuss [place] Disposition Plan:  [place] place on Monday   Consultants:    Palliative  SLP  Antibiotics:  Vanc 03/25/15 >> 03/27/15  Zosyn 03/25/15 >> 7/29  HPI/Subjective:  Vomiting today.  Still not tolerating PO.  Objective: Filed Vitals:   04/03/15 2025 04/04/15 0510 04/04/15 0926 04/04/15 1405  BP: 125/88 144/91  131/99  Pulse: 86 103  95  Temp: 98.6 F (37 C) 98.7 F (37.1 C)  98.3 F (36.8 C)  TempSrc: Oral Oral  Oral  Resp: $Remo'18 18  18  'gXONb$ Height:      Weight:      SpO2: 98% 94% 95% 97%    Intake/Output Summary (Last 24 hours) at 04/04/15 1428 Last data filed at 04/04/15 1406  Gross per 24 hour  Intake 1108.67 ml  Output   1175 ml  Net -66.33 ml   Filed Weights   03/25/15 0625  Weight: 68.6 kg (151 lb 3.8 oz)   Body mass index is 23 kg/(m^2).  Exam:   General:  Thin adult male, moderate respiratory distress with SCM and subcostal retractions  HEENT:  NCAT, MMM  Cardiovascular:  RRR, nl S1, S2 no mrg, 2+ pulses, warm extremities  Respiratory:  Rhonchorous cough with wet sounds in back of throat  Abdomen:   NABS, soft, NT/ND  MSK:   Normal tone and bulk, no LEE  Neuro:  Diffusely weak, grossly moves all extremities,5-/5 right upper and lower extremity compared to 5/5 left upper and lower extremities  Data Reviewed: Basic Metabolic Panel:  Recent Labs Lab 03/29/15 0534 03/30/15 0606 04/02/15 0444 04/03/15 0426 04/04/15 0447  NA 139 136 133* 132* 133*  K 3.9 4.0 3.6 3.3* 3.8  CL 104 106 101 99* 102  CO2  27 21* $Remo'25 26 25  'lZpkM$ GLUCOSE 84 77 109* 110* 104*  BUN 30* 26* $Remov'19 14 11  'nCevAP$ CREATININE 2.25* 1.84* 1.51* 1.40* 1.48*  CALCIUM 10.1 9.6 9.6 9.6 9.5   Liver Function Tests:  Recent Labs Lab 03/30/15 0606 04/04/15 0447  AST 20 18  ALT 12* 13*  ALKPHOS 34* 35*  BILITOT 0.6 0.3  PROT 8.3* 7.5  ALBUMIN 2.5* 2.2*    Recent Labs Lab 04/04/15 0447  LIPASE 30   No results for input(s): AMMONIA in the last 168 hours. CBC:  Recent Labs Lab 03/29/15 0534 03/30/15 0606 04/02/15 0444 04/03/15 0426 04/04/15 0447  WBC 3.5* 4.9 4.1 4.0 5.2  HGB 11.2* 10.6* 10.9* 11.4* 10.8*  HCT 33.7* 31.5* 32.8* 34.0* 31.6*  MCV 92.6 92.9 92.9 91.9 92.7  PLT 215 229 219 228 212    Recent Results (from the past 240 hour(s))  Culture, blood (routine x 2)     Status: None   Collection Time: 03/25/15  2:30 PM  Result Value Ref Range Status   Specimen Description BLOOD RIGHT ANTECUBITAL  Final   Special Requests BOTTLES DRAWN AEROBIC AND ANAEROBIC  10CC  Final   Culture   Final    NO GROWTH 5 DAYS Performed at [facility]    Report Status 03/30/2015 FINAL  Final     Studies: Dg Abd Acute W/chest  04/04/2015   CLINICAL DATA:  Lung cancer  EXAM: DG ABDOMEN ACUTE W/ 1V CHEST  COMPARISON:  CT 03/28/2015  FINDINGS: Stable cardiac silhouette. The thoracic aorta is ectatic. RIGHT suprahilar mass is again demonstrated which corresponds to atelectatic lung on comparison CT. RIGHT lower lobe airspace disease again demonstrated. The may be slightly increased.  There are gas-filled loops of large and small bowel which are not dilated. There is gas within the rectum. No intraperitoneal free air. No pathologic calcifications.  IMPRESSION: 1. Stable RIGHT perihilar mass representing atelectatic lung on comparison CT. 2. RIGHT lower lobe pneumonia. Slight increased from CT radiograph of 03/28/2015 but similar to CT of 03/25/2015 3. Gas-filled loops of large small bowel without evidence obstruction.    Electronically Signed   By: Suzy Bouchard M.D.   On: 04/04/2015 10:29    Scheduled Meds: . antiseptic oral rinse  7 mL Mouth Rinse q12n4p  . chlorhexidine  15 mL Mouth Rinse BID  . dexamethasone  4 mg Intravenous Q24H  . ondansetron (ZOFRAN) IV  4 mg Intravenous 3 times per day   Continuous Infusions:    Active Problems:   Postobstructive pneumonia   Essential hypertension   Bronchogenic carcinoma of right lung   Pneumonia   Lung cancer, main bronchus   Aphonia   Brain metastasis   Dysphagia   Palliative care encounter   DNR (do not resuscitate) discussion    Time spent: 30 min    Janey Petron, [facility] Pager [phone]. If 7PM-7AM, please contact night-coverage at [email or web address], password [facility] 04/04/2015, 2:28 PM  LOS: 10 days

## 2015-04-04 NOTE — Progress Notes (Addendum)
Daily Progress Note   Patient Name: Stephen Ellis       Date: 04/04/2015 DOB: [birth year]  Age: 69 y.o. MRN#: [id number] Attending Physician: Janece Canterbury, MD Primary Care Physician: Elizabeth Palau, MD Admit Date: 03/24/2015  Reason for Consultation/Follow-up: Establishing goals of care, Family-clinician negotiation, Non pain symptom management and Psychosocial/spiritual support  Subjective:    -meet with large family as scheduled with  Only child Son/ Solmon Ice  # [phone] (legal decision maker), and his wife Santiago Glad,  Nephew/ [name], Sister/ Orpah Cobb, and brother Zaeem Kandel and his wife Inez Catalina  - detailed conversation with both regarding diagnosis, prognosis, values and goals of care important to patient and his family.  -discussed natural trajectory and expectations  at EOL, questions and concerns addressed  - Randall Hiss the patient's only son adamantly voiced that he does not want to prolong his father's suffering and only  "want's him to be comfortable".  All family present are in agreement and can support the decsion   Length of Stay: 10 days  Current Medications: Scheduled Meds:  . amLODipine  10 mg Oral Daily  . antiseptic oral rinse  7 mL Mouth Rinse q12n4p  . budesonide-formoterol  2 puff Inhalation BID  . chlorhexidine  15 mL Mouth Rinse BID  . dexamethasone  4 mg Oral Daily  . dronabinol  2.5 mg Oral BID AC  . enoxaparin (LOVENOX) injection  40 mg Subcutaneous Q24H  . ondansetron (ZOFRAN) IV  4 mg Intravenous 3 times per day  . saccharomyces boulardii  250 mg Oral BID  . vitamin C  250 mg Oral Daily    Continuous Infusions: . dextrose 5 % and 0.45 % NaCl with KCl 40 mEq/L 50 mL/hr at 04/04/15 0855    PRN Meds: ipratropium-albuterol, LORazepam, ondansetron (ZOFRAN) IV, promethazine, senna-docusate  Palliative Performance Scale: 20 % at best     Vital Signs: BP 144/91 mmHg  Pulse 103  Temp(Src) 98.7 F (37.1 C) (Oral)  Resp 18  Ht  '5\' 8"'$  (1.727 m)  Wt 68.6 kg (151 lb 3.8 oz)  BMI 23.00 kg/m2  SpO2 95% SpO2: SpO2: 95 % O2 Device: O2 Device: Not Delivered O2 Flow Rate:    Intake/output summary:   Intake/Output Summary (Last 24 hours) at 04/04/15 1205 Last data filed at 04/04/15 0900  Gross per 24 hour  Intake 1156.67 ml  Output   1225 ml  Net -68.33 ml   LBM: Last BM Date: 04/04/15 (smear of bm) Baseline Weight: Weight: 68.6 kg (151 lb 3.8 oz) Most recent weight: Weight: 68.6 kg (151 lb 3.8 oz)  Physical Exam:              General: chronically ill appearing,  HEENT: moist buccal membranes, audible throat secretions CVS: tachcardic Resp: decreased in bases, scattered rhonchi  Skin: warm and dry Neuro: eyes open and tracks, nod head to communicate yes/no   Additional Data Reviewed: Recent Labs     04/03/15  0426  04/04/15  0447  WBC  4.0  5.2  HGB  11.4*  10.8*  PLT  228  212  NA  132*  133*  BUN  14  11  CREATININE  1.40*  1.48*     Problem List:  Patient Active Problem List   Diagnosis Date Noted  . Palliative care encounter 04/03/2015  . DNR (do not resuscitate) discussion 04/03/2015  . Brain metastasis 03/30/2015  . Dysphagia 03/30/2015  . Aphonia 03/25/2015  . Malnutrition of moderate  degree 03/14/2015  . Aspiration pneumonia 03/13/2015  . CKD (chronic kidney disease) stage 3, GFR 30-59 ml/min 03/13/2015  . ARF (acute renal failure) 03/13/2015  . Lung cancer, main bronchus 01/04/2015  . Pneumonia 12/26/2014  . AKI (acute kidney injury) 09/20/2014  . Acute respiratory failure with hypoxia 09/06/2014  . Anemia of chronic disease 09/06/2014  . Leukopenia 09/06/2014  . HCAP (healthcare-associated pneumonia) 09/01/2014  . Essential hypertension 09/01/2014  . Bronchogenic carcinoma of right lung   . Postobstructive pneumonia 08/07/2014  . Plasma cell dyscrasia 04/19/2012     Palliative Care Assessment & Plan    Code Status:    DNR/DNI  Goals of Care:   Focus of care is  comfort, quality and dignity.  No further diagnostics, no artifical feeding or hydration now or in the future.  Symptom management to enhance comfort. Comfort feeds as tolerated with known risk of aspiration, family understands risks and benefit, goal is to enhance comfort..  5. Prognosis: less than 2-4 weeks  5. Discharge Planning: Monitor over the next 24-48 hours, evaluate for residential hospice on Monday morning   Discussed with Dr Sheran Fava  Thank you for allowing the Palliative Medicine Team to assist in the care of this patient.   Time In:  1145 Time Out: 1300 Total Time  75 min Prolonged Time Billed  no     Greater than 50%  of this time was spent counseling and coordinating care related to the above assessment and plan.     Knox Royalty, NP  04/04/2015, 12:05 PM  Please contact Palliative Medicine Team phone at [phone] for questions and concerns.

## 2015-04-04 NOTE — Progress Notes (Signed)
PT Cancellation Note  Patient Details Name: [name] MRN: [id number] DOB: 1946/05/19   Cancelled Treatment:    Reason Eval/Treat Not Completed: Other (comment) (another Palliative mtg/further GOC discussion scheduled for today, will defer at this time and see tomorrow if time permits)   [facility] 04/04/2015, 9:12 AM

## 2015-04-05 DIAGNOSIS — Z7189 Other specified counseling: Secondary | ICD-10-CM

## 2015-04-05 NOTE — Progress Notes (Addendum)
Pt was awake when I arrived but non-verbal. From hand communication I shared w/his nurse what I ascertained that he needed; he also wanted prayer. Pt nodded that he was ok at the conclusion of our visit and reached out to shake my hand. Will refer for follow-up visits. Chaplain Marlise Eves Holder   04/05/15 1800  Clinical Encounter Type  Visited With Patient

## 2015-04-05 NOTE — Progress Notes (Signed)
[facility] PROGRESS NOTE  VISHWA DAIS TXM:[id number] DOB: 06-08-1946 DOA: 03/24/2015 PCP: Elizabeth Palau, MD  Brief Summary  69 year old male with lung cancer with neuroendocrine diff, plasma cell dyscrasia, HTN, dementia and anemia presented to the ED on 03/24/15 for vomiting. He has been treated multiple times in the past few months for aspiration and post-obstructive pneumonia. His most recent treatments of chemotherapy and radiation for lung cancer were in May 2016.  Per family he has had a wet cough x 3 weeks, has been vomiting the past few days and has been non-verbal for the past 3-4 weeks with no known falls. Speech therapy has been involved.    Assessment/Plan  Pneumonia, aspiration versus postobstructive s/p 10 days of zosyn.  Ongoing aspiration. - Chest CT: Consistent with spread of cancer although patient does have a history of postobstructive and aspiration pneumonia - Blood cultures pending 7/24, NGTD  Nausea and vomiting, possible bowel obstruction from cancer -  CXR/KUB to screen for obstruction and recurrent aspiration PNA:  Worsened RLL PNA and gas-filled loops of small bowel without obvious obstruction  Dysphagia, with ongoing aspiration despite dysphagia 1 diet, minimal PO intake -  Continue Palliative diet -  Greatly appreciate palliative care assistance   Metastatic Lung Cancer, right hilum s/p XRT to lung and no longer candidate for chemotherapy, currently stage IV disease with met to the brain causing right sided weakness, aphonia and dysphagia with aspiration, improved somewhat with steroids - CT scan 7/20 demonstrated metastatic 2 cm round mass in the left parietal region  - Phenergran PRN for nausea  - continue decadron  - Appreciate Dr. Earlie Server and Dr. Johny Shears assistance - Greatly appreciate palliative care assistance:  Comfort measures only and anticipate [facility] on Monday  Prior plasma cell dyscrasia/myeloma - Defer to  oncology  Acute on chronic kidney disease, resolved with IVF and creatinine stable at 1.4, no further last  Hypokalemia, resolved after increasing potassium in IVF  Hypertension, BP elevated, d/c oral medications, comfort measures only  Generalized weakness due to metastatic cancer, transfer to [facility] place on Monday  Code Status:   DNR Family Communication: patient alone.  Wadie Lessen from palliative care had a family meeting today to discuss [place] Disposition Plan:  [place] place on Monday   Consultants:    Palliative  SLP  Antibiotics:  Vanc 03/25/15 >> 03/27/15  Zosyn 03/25/15 >> 7/29  HPI/Subjective:  Still not tolerating PO.  SOB.  Feels cold (gave additional blanket)  Objective: Filed Vitals:   04/04/15 1405 04/04/15 2018 04/05/15 0607 04/05/15 1401  BP: 131/99 132/85 143/97 135/90  Pulse: 95 99 89 85  Temp: 98.3 F (36.8 C) 98.4 F (36.9 C) 98.3 F (36.8 C) 98.2 F (36.8 C)  TempSrc: Oral Oral Oral Oral  Resp: _0 Height:      Weight:      SpO2: 97% 100% 99% 98%    Intake/Output Summary (Last 24 hours) at 04/05/15 1524 Last data filed at 04/05/15 1402  Gross per 24 hour  Intake      0 ml  Output    700 ml  Net   -700 ml   Filed Weights   03/25/15 0625  Weight: 68.6 kg (151 lb 3.8 oz)   Body mass index is 23 kg/(m^2).  Exam:   General:  Thin adult male, mild respiratory distress with intermittent and wet/gurgling noises in back of throat.   HEENT:  NCAT, MMM  Cardiovascular:  RRR, nl S1,  S2 no mrg, 2+ pulses, warm extremities  Respiratory:  Rhonchorous cough, rales at bases, no wheezes  Abdomen:   NABS, soft, NT/ND  MSK:   Normal tone and bulk, no LEE  Neuro:  Diffusely weak, grossly moves all extremities,5-/5 right upper and lower extremity compared to 5/5 left upper and lower extremities  Data Reviewed: Basic Metabolic Panel:  Recent Labs Lab 03/30/15 0606 04/02/15 0444 04/03/15 0426 04/04/15 0447  NA 136 133* 132*  133*  K 4.0 3.6 3.3* 3.8  CL 106 101 99* 102  CO2 21* _0 GLUCOSE 77 109* 110* 104*  BUN 26* _1 CREATININE 1.84* 1.51* 1.40* 1.48*  CALCIUM 9.6 9.6 9.6 9.5   Liver Function Tests:  Recent Labs Lab 03/30/15 0606 04/04/15 0447  AST 20 18  ALT 12* 13*  ALKPHOS 34* 35*  BILITOT 0.6 0.3  PROT 8.3* 7.5  ALBUMIN 2.5* 2.2*    Recent Labs Lab 04/04/15 0447  LIPASE 30   No results for input(s): AMMONIA in the last 168 hours. CBC:  Recent Labs Lab 03/30/15 0606 04/02/15 0444 04/03/15 0426 04/04/15 0447  WBC 4.9 4.1 4.0 5.2  HGB 10.6* 10.9* 11.4* 10.8*  HCT 31.5* 32.8* 34.0* 31.6*  MCV 92.9 92.9 91.9 92.7  PLT 229 219 228 212    No results found for this or any previous visit (from the past 240 hour(s)).   Studies: Dg Abd Acute W/chest  04/04/2015   CLINICAL DATA:  Lung cancer  EXAM: DG ABDOMEN ACUTE W/ 1V CHEST  COMPARISON:  CT 03/28/2015  FINDINGS: Stable cardiac silhouette. The thoracic aorta is ectatic. RIGHT suprahilar mass is again demonstrated which corresponds to atelectatic lung on comparison CT. RIGHT lower lobe airspace disease again demonstrated. The may be slightly increased.  There are gas-filled loops of large and small bowel which are not dilated. There is gas within the rectum. No intraperitoneal free air. No pathologic calcifications.  IMPRESSION: 1. Stable RIGHT perihilar mass representing atelectatic lung on comparison CT. 2. RIGHT lower lobe pneumonia. Slight increased from CT radiograph of 03/28/2015 but similar to CT of 03/25/2015 3. Gas-filled loops of large small bowel without evidence obstruction.   Electronically Signed   By: Suzy Bouchard M.D.   On: 04/04/2015 10:29    Scheduled Meds: . antiseptic oral rinse  7 mL Mouth Rinse q12n4p  . chlorhexidine  15 mL Mouth Rinse BID  . dexamethasone  4 mg Intravenous Q24H  . ondansetron (ZOFRAN) IV  4 mg Intravenous 3 times per day   Continuous Infusions:    Active Problems:    Postobstructive pneumonia   Essential hypertension   Bronchogenic carcinoma of right lung   Pneumonia   Lung cancer, main bronchus   Aphonia   Brain metastasis   Dysphagia   Palliative care encounter   DNR (do not resuscitate) discussion    Time spent: 30 min    Trae Bovenzi, [place] [facility] Pager [phone]. If 7PM-7AM, please contact night-coverage at [email or web address], password [facility] 04/05/2015, 3:24 PM  LOS: 11 days

## 2015-04-06 ENCOUNTER — Ambulatory Visit: Payer: Medicare HMO

## 2015-04-06 ENCOUNTER — Telehealth: Payer: Self-pay | Admitting: Radiation Oncology

## 2015-04-06 DIAGNOSIS — R1114 Bilious vomiting: Secondary | ICD-10-CM | POA: Insufficient documentation

## 2015-04-06 DIAGNOSIS — F411 Generalized anxiety disorder: Secondary | ICD-10-CM | POA: Insufficient documentation

## 2015-04-06 MED ORDER — DEXAMETHASONE SODIUM PHOSPHATE 4 MG/ML IJ SOLN: 4.0000 mg | INTRAMUSCULAR | Status: AC

## 2015-04-06 MED ORDER — LORAZEPAM 2 MG/ML IJ SOLN
1.0000 mg | Freq: Four times a day (QID) | INTRAMUSCULAR | Status: DC
  Administered 2015-04-06: 1 mg via INTRAVENOUS
  Filled 2015-04-06: qty 1

## 2015-04-06 MED ORDER — IPRATROPIUM-ALBUTEROL 0.5-2.5 (3) MG/3ML IN SOLN: 3.0000 mL | RESPIRATORY_TRACT | Status: AC | PRN

## 2015-04-06 MED ORDER — LORAZEPAM 2 MG/ML IJ SOLN: 1.0000 mg | INTRAMUSCULAR | Status: AC | PRN

## 2015-04-06 MED ORDER — ONDANSETRON HCL 4 MG/2ML IJ SOLN: 4.0000 mg | Freq: Three times a day (TID) | INTRAMUSCULAR | Status: AC

## 2015-04-06 MED ORDER — BISACODYL 10 MG RE SUPP: 10.0000 mg | Freq: Every day | RECTAL | Status: AC | PRN

## 2015-04-06 MED ORDER — MORPHINE SULFATE 2 MG/ML IJ SOLN: 1.0000 mg | INTRAMUSCULAR | Status: AC | PRN

## 2015-04-06 MED ORDER — LORAZEPAM 2 MG/ML IJ SOLN: 1.0000 mg | Freq: Four times a day (QID) | INTRAMUSCULAR | Status: AC

## 2015-04-06 MED ORDER — ONDANSETRON HCL 4 MG/2ML IJ SOLN: 4.0000 mg | Freq: Four times a day (QID) | INTRAMUSCULAR | Status: AC | PRN

## 2015-04-06 NOTE — Progress Notes (Signed)
Pt for discharge to [facility].  CSW received notification from [facility], Erling Conte that [facility] has bed available today and meeting with pt son, Randall Hiss at bedside to complete admission paperwork.  CSW notified MD.  CSW facilitated pt discharge needs including contacting facility, confirming [facility] liaison, Erling Conte faxed pt discharge information to [facility], discussing with pt son, Randall Hiss at bedside, providing RN phone number to call report, and arranging ambulance transport for pt to [facility].  No further social work needs identified at this time.  CSW signing off.   Alison Murray, MSW, [place] Work [phone]

## 2015-04-06 NOTE — Progress Notes (Signed)
CSW continuing to follow.  CSW received consult for residential hospice placement.  CSW visited pt room and pt resting comfortably at this time. No family present at bedside.   CSW contacted pt son, Solmon Ice # [phone] (legal decision maker), via telephone and left voice message.  CSW received return phone call from pt son, Randall Hiss. CSW discussed with pt son recommendation for residential hospice placement. CSW offered pt choice of residential hospice facilities. Pt son chooses [facility]. CSW discussed with pt son that if [facility] is full then it may be necessary to explore secondary options for residential hospice facilities. Pt son expressed understanding.  CSW contacted [employer] and made referral to [facility]. CSW to await [place] to process referral and update CSW regarding availability.   CSW to continue to follow to provide support and assist with pt disposition needs.   Alison Murray, MSW, [place] Work [phone]

## 2015-04-06 NOTE — Discharge Summary (Signed)
Physician Discharge Summary  Stephen Ellis FHL:[id number] DOB: [birth year] DOA: 03/24/2015  PCP: Elizabeth Palau, MD  Admit date: 03/24/2015 Discharge date: 04/06/2015  Recommendations for Outpatient Follow-up:  1. To [facility] place for ongoing management of dyspnea, aspiration, metastatic lung cancer  Discharge Diagnoses:  Principal Problem:   Postobstructive pneumonia Active Problems:   Essential hypertension   Bronchogenic carcinoma of right lung   Pneumonia   Primary cancer of right lung metastatic to other site   Aphonia   Brain metastasis   Dysphagia   Palliative care encounter   DNR (do not resuscitate) discussion   Anxiety state   Discharge Condition:  guarded  Diet recommendation: dysphagia 1 with thin   Wt Readings from Last 3 Encounters:  03/25/15 68.6 kg (151 lb 3.8 oz)  03/13/15 70.4 kg (155 lb 3.3 oz)  02/19/15 74.39 kg (164 lb)    History of present illness:   69 year old male with lung cancer with neuroendocrine diff, plasma cell dyscrasia, HTN, dementia and anemia presented to the ED on 03/24/15 for vomiting. He had been treated multiple times in the past few months for aspiration and post-obstructive pneumonia. His most recent treatments of chemotherapy and radiation for lung cancer were in May 2016.    Hospital Course:   Pneumonia, aspiration versus postobstructive s/p 10 days of zosyn. Chest CT: Consistent with spread of cancer although patient also appeared to have postobstructive and aspiration pneumonia.  Blood cultures NGTD.    Dysphagia, with ongoing aspiration despite dysphagia 1 diet, minimal PO intake.  After multiple conversations with palliative care, the family ultimately decided to pursue comfort measures and allow him to eat despite the high risk of aspiration. They declined feeding tube.  He is using suction canister frequently.    Metastatic Lung Cancer, right hilum s/p XRT to lung and no longer candidate for chemotherapy, currently stage  IV disease with met to the brain causing right sided weakness, aphonia and dysphagia with aspiration.  He was started on IV dexamethasone which was transitioned to oral Decadron. He had some improvement in his right-sided weakness and was able to make a few noises with his throat, but he remained unable to articulate.  His dysphasia persisted.  The family met with Dr. Earlie Server from oncology and Dr. Tammi Klippel from radiation oncology, and palliative care.  Due to his decline, they elected to pursue comfort measures only and he is being discharged to inpatient hospice for end-of-life care.    Nausea and vomiting, possible bowel obstruction from cancer. Worsened RLL PNA and gas-filled loops of small bowel without obvious obstruction.  Started scheduled antiemetics.    Prior plasma cell dyscrasia/myeloma, no further workup or treatment.  Acute on chronic kidney disease, resolved with IVF and creatinine stable at 1.4, no further labs.  Hypokalemia, resolved after increasing potassium in IVF.  Hypertension, BP elevated, d/c'd oral medications, comfort measures only  Generalized weakness due to metastatic cancer.     Consultants:   Palliative  SLP  Antibiotics:  Vanc 03/25/15 >> 03/27/15  Zosyn 03/25/15 >> 7/29  Discharge Exam: Filed Vitals:   04/06/15 0500  BP: 135/82  Pulse: 76  Temp: 98.2 F (36.8 C)  Resp: 18   Filed Vitals:   04/05/15 0607 04/05/15 1401 04/05/15 2125 04/06/15 0500  BP: 143/97 135/90 133/94 135/82  Pulse: 89 85 80 76  Temp: 98.3 F (36.8 C) 98.2 F (36.8 C) 98.1 F (36.7 C) 98.2 F (36.8 C)  TempSrc: Oral Oral Axillary Oral  Resp: 24  _0 Height:      Weight:      SpO2: 99% 98% 98% 100%     General: Thin adult male, no acute distress lying on left side in fetal position   HEENT: NCAT, MMM  Cardiovascular: RRR, nl S1, S2 no mrg, 2+ pulses, warm extremities  Respiratory: Rhonchorous breath sounds, diminished at left base with rales at bases,  no wheezes  Abdomen: NABS, soft, NT/ND  MSK: Normal tone and bulk, no LEE  Neuro: Diffusely weak, grossly moves all extremities,5-/5 right upper and lower extremity compared to 5/5 left upper and lower extremities  Discharge Instructions      Discharge Instructions    Call MD for:  difficulty breathing, headache or visual disturbances    Complete by:  As directed      Call MD for:  hives    Complete by:  As directed      Call MD for:  persistant nausea and vomiting    Complete by:  As directed      Call MD for:  severe uncontrolled pain    Complete by:  As directed      Call MD for:  temperature >100.4    Complete by:  As directed      Increase activity slowly    Complete by:  As directed             Medication List    STOP taking these medications        acetaminophen 325 MG tablet  Commonly known as:  TYLENOL     albuterol (2.5 MG/3ML) 0.083% nebulizer solution  Commonly known as:  PROVENTIL     amLODipine 10 MG tablet  Commonly known as:  NORVASC     amoxicillin-clavulanate 500-125 MG per tablet  Commonly known as:  AUGMENTIN     budesonide-formoterol 160-4.5 MCG/ACT inhaler  Commonly known as:  SYMBICORT     CENTROVITE Tabs     dextrose 5 % and 0.45 % NaCl 5-0.45 %     guaiFENesin 100 MG/5ML liquid  Commonly known as:  ROBITUSSIN     nystatin 100000 UNIT/ML suspension  Commonly known as:  MYCOSTATIN     oxyCODONE 5 MG immediate release tablet  Commonly known as:  Oxy IR/ROXICODONE     Potassium Chloride ER 20 MEQ Tbcr     PROBIOTIC DAILY PO     prochlorperazine 10 MG tablet  Commonly known as:  COMPAZINE      TAKE these medications        bisacodyl 10 MG suppository  Commonly known as:  DULCOLAX  Place 1 suppository (10 mg total) rectally daily as needed for mild constipation.     dexamethasone 4 MG/ML injection  Commonly known as:  DECADRON  Inject 1 mL (4 mg total) into the vein daily.     ipratropium-albuterol 0.5-2.5 (3)  MG/3ML Soln  Commonly known as:  DUONEB  Take 3 mLs by nebulization every 4 (four) hours as needed.     LORazepam 2 MG/ML injection  Commonly known as:  ATIVAN  Inject 0.5 mLs (1 mg total) into the vein every 3 (three) hours as needed for anxiety or seizure.     LORazepam 2 MG/ML injection  Commonly known as:  ATIVAN  Inject 0.5 mLs (1 mg total) into the vein every 6 (six) hours.     morphine 2 MG/ML injection  Inject 0.5 mLs (1 mg total) into the vein every 3 (three) hours as  needed (dyspnea).     ondansetron 4 MG/2ML Soln injection  Commonly known as:  ZOFRAN  Inject 2 mLs (4 mg total) into the vein every 8 (eight) hours.     ondansetron 4 MG/2ML Soln injection  Commonly known as:  ZOFRAN  Inject 2 mLs (4 mg total) into the vein every 6 (six) hours as needed for nausea or vomiting.          The results of significant diagnostics from this hospitalization (including imaging, microbiology, ancillary and laboratory) are listed below for reference.    Significant Diagnostic Studies: Dg Chest 2 View  03/25/2015   CLINICAL DATA:  Vomiting blood, acute onset.  Initial encounter.  EXAM: CHEST  2 VIEW  COMPARISON:  Chest radiograph performed 03/16/2015, and CTA of the chest performed 01/14/2015  FINDINGS: Dense consolidation of a portion of the right upper lobe is grossly unchanged from prior studies, reflecting the known underlying mass. The left lung appears clear. No new superimposed pneumonia is characterized. No pleural effusion or pneumothorax is identified.  The heart is normal in size. No acute osseous abnormalities are identified.  IMPRESSION: Dense consolidation of a portion of the right upper lung lobe is grossly unchanged, reflecting the known underlying mass. No superimposed pneumonia seen.   Electronically Signed   By: Garald Balding M.D.   On: 03/25/2015 01:11   Dg Chest 2 View  03/16/2015   CLINICAL DATA:  Initial encounter for sudden onset fever. History of lung cancer  and hypertension.  EXAM: CHEST  2 VIEW  COMPARISON:  03/15/2015.  FINDINGS: AP and lateral views of the thorax again show low lung volumes with slight asymmetric elevation the right hemidiaphragm. Bibasilar collapse/consolidation, right greater than left persist. Opacity along the right mediastinum is compatible with right upper lobe collapse as seen on previous chest CT which would also account for the right-sided volume loss. Cardiopericardial silhouette is upper limits of normal for size. Imaged bony structures of the thorax are intact.  IMPRESSION: No substantial interval change. Right greater than left basilar collapse/consolidation with persistent abnormal right paramediastinal opacity, likely related to right upper lobe collapse.   Electronically Signed   By: Misty Stanley M.D.   On: 03/16/2015 13:38   Dg Chest 2 View  03/15/2015   CLINICAL DATA:  Cough.  History lung cancer.  EXAM: CHEST  2 VIEW  COMPARISON:  03/12/2015 and prior exams  FINDINGS: The cardiomediastinal silhouette is unchanged.  This is a low volume film.  Right upper and lower lobe atelectasis/consolidation again noted.  Mild left lower lobe atelectasis/ airspace disease noted.  There is no evidence of pneumothorax or definite pleural effusion.  IMPRESSION: Low volume film with continued right upper and lower lobe consolidations/ atelectasis and mild left lower lobe consolidations/atelectasis.  No other significant change.   Electronically Signed   By: Margarette Canada M.D.   On: 03/15/2015 10:28   Dg Chest 2 View  03/12/2015   CLINICAL DATA:  Current history of right upper lobe lung cancer, presenting with acute fever and dehydration.  EXAM: CHEST  2 VIEW  COMPARISON:  CT chest 01/04/2015 and earlier. Chest x-rays 01/02/2015 and earlier.  FINDINGS: AP semi-erect and lateral images were obtained. Cardiac silhouette normal in size, unchanged. Central right upper lobe mass extending into the right hilum with postobstructive atelectasis  involving the right upper lobe, unchanged. Persistent or recurrent patchy airspace opacities in the right lower lobe. Lungs remain clear otherwise. No pleural effusions. Pulmonary vascularity normal. No  pneumothorax. Degenerative changes and DISH involving the thoracic spine.  IMPRESSION: 1. Residual or recurrent pneumonia involving the right lower lobe since the 01/02/2015 chest x-ray. 2. Stable central right upper lobe lung mass extending into the right hilum with postobstructive atelectasis involving the right upper lobe.   Electronically Signed   By: Evangeline Dakin M.D.   On: 03/12/2015 18:22   Ct Head Wo Contrast  03/25/2015   CLINICAL DATA:  Stroke-like symptoms  EXAM: CT HEAD WITHOUT CONTRAST  TECHNIQUE: Contiguous axial images were obtained from the base of the skull through the vertex without intravenous contrast.  COMPARISON:  09/10/2004  FINDINGS: The bony calvarium is intact. No gross soft tissue abnormality is noted. Mild atrophic changes are noted. In the left parietal region there is a 2.1 x 2.9 cm peripherally dense mass lesion with associated mass-effect and very minimal midline shift of 2 mm from left to right. Mass-effect upon the adjacent gyri is noted as well. Additionally there is an area of increased attenuation in the left temporal lobe best seen on image number 9 of series 2. This may represent a small metastatic deposit. Some regular density is noted in the superior cerebellum on image number 12 of series 2 which may also represent a focal mass lesion. No acute hemorrhage is seen. Scattered chronic white matter ischemic changes are seen.  IMPRESSION: Left parietal mass lesion with associated mass-effect as well as suggestion of smaller lesions in the left temporal lobe and superior cerebellum on the left consistent with metastatic disease. MRI examination with contrast material is recommended for further evaluation.  These results will be called to the ordering clinician or  representative by the Radiologist Assistant, and communication documented in the PACS or zVision Dashboard.   Electronically Signed   By: Inez Catalina M.D.   On: 03/25/2015 15:25   Ct Angio Chest Pe W/cm &/or Wo Cm  03/25/2015   CLINICAL DATA:  Acute onset of nausea and vomiting. Cough. Current history of right-sided lung cancer. Initial encounter.  EXAM: CT ANGIOGRAPHY CHEST WITH CONTRAST  TECHNIQUE: Multidetector CT imaging of the chest was performed using the standard protocol during bolus administration of intravenous contrast. Multiplanar CT image reconstructions and MIPs were obtained to evaluate the vascular anatomy.  CONTRAST:  13m OMNIPAQUE IOHEXOL 350 MG/ML SOLN  COMPARISON:  Chest radiograph performed earlier today at 12:20 a.m., and CTA of the chest performed 01/04/2015  FINDINGS: There is no evidence of pulmonary embolus.  Recurrent patchy opacities are noted at the right lower lobe, with an associated small right pleural effusion, concerning for recurrent pneumonia. This is less prominent than in May.  Mass about the right hilum and tracking into the collapsed portion of the right upper lobe is improved, with visualization of part of the right mainstem bronchus, new from the prior study. There is persistent mass effect on the right mainstem bronchus and right-sided bronchioles.  No pneumothorax is identified. The left lung is essentially clear. Mild nodular scarring is noted at the right lung apex.  There is partial extension of mass into the right paratracheal region. A mildly enlarged azygoesophageal recess node is seen, measuring 1.4 cm in Ahava Kissoon axis. This is concerning for metastatic disease. Scattered periaortic nodes remain normal in size. No pericardial effusion is identified. The great vessels are grossly unremarkable in appearance. No axillary lymphadenopathy is seen. The visualized portions of the thyroid gland are unremarkable in appearance.  The visualized portions of the liver and  spleen are unremarkable.  No acute osseous abnormalities are seen. Anterior bridging osteophytes are noted along the thoracic spine.  Review of the MIP images confirms the above findings.  IMPRESSION: 1. No evidence of pulmonary embolus. 2. Recurrent patchy opacities at the right lower lung lobe, with associated small right pleural effusion, concerning for recurrent pneumonia. 3. Mass about the right hilum and tracking into the collapsed portion of the right upper lobe is improved in appearance, with visualization of part of the right mainstem bronchus (was completely collapsed on the prior CTA). Residual mass-effect on the right mainstem bronchus and right-sided bronchioles. 4. Mildly enlarged azygoesophageal recess node is concerning for metastatic disease. Partial extension of mass into the right paratracheal region.   Electronically Signed   By: Garald Balding M.D.   On: 03/25/2015 03:44   Dg Chest Port 1 View  03/28/2015   CLINICAL DATA:  Productive cough and rows. Possible aspiration pneumonia. History of right lung cancer.  EXAM: PORTABLE CHEST - 1 VIEW  COMPARISON:  Chest radiographs and CT 03/25/2015  FINDINGS: Cardiomediastinal silhouette is unchanged with persistent medial right upper lobe suprahilar soft tissue mass narrowing the right mainstem bronchus, better demonstrated on recent CT. There is persistent patchy opacity in the right lower lobe, with new mild opacity in the left lung base. No sizable pleural effusion or pneumothorax is identified. Residual contrast is noted in the splenic flexure of the colon.  IMPRESSION: 1. Patchy bibasilar opacities, new on the left and suspicious for pneumonia or aspiration. 2. Unchanged right upper lobe mass.   Electronically Signed   By: Logan Bores   On: 03/28/2015 19:01   Dg Abd Acute W/chest  04/04/2015   CLINICAL DATA:  Lung cancer  EXAM: DG ABDOMEN ACUTE W/ 1V CHEST  COMPARISON:  CT 03/28/2015  FINDINGS: Stable cardiac silhouette. The thoracic aorta is  ectatic. RIGHT suprahilar mass is again demonstrated which corresponds to atelectatic lung on comparison CT. RIGHT lower lobe airspace disease again demonstrated. The may be slightly increased.  There are gas-filled loops of large and small bowel which are not dilated. There is gas within the rectum. No intraperitoneal free air. No pathologic calcifications.  IMPRESSION: 1. Stable RIGHT perihilar mass representing atelectatic lung on comparison CT. 2. RIGHT lower lobe pneumonia. Slight increased from CT radiograph of 03/28/2015 but similar to CT of 03/25/2015 3. Gas-filled loops of large small bowel without evidence obstruction.   Electronically Signed   By: Suzy Bouchard M.D.   On: 04/04/2015 10:29   Dg Swallowing Func-speech Pathology  03/27/2015    Objective Swallowing Evaluation:    Patient Details  Name: SAMY RYNER MRN: [id number] Date of Birth: February 15, 1946  Today's Date: 03/27/2015 Time: SLP Start Time (ACUTE ONLY): 1350-SLP Stop Time (ACUTE ONLY): 1415 SLP Time Calculation (min) (ACUTE ONLY): 25 min  Past Medical History:  Past Medical History  Diagnosis Date  . Syncope   . Dehydration   . Blind left eye   . Hypertension   . Anemia     "low Blood"  . Dementia     poor memory  . Lung cancer 08/12/14    Right lung   Past Surgical History:  Past Surgical History  Procedure Laterality Date  . Video bronchoscopy Bilateral 07/29/2014    Procedure: VIDEO BRONCHOSCOPY WITHOUT FLUORO;  Surgeon: Tanda Rockers,  MD;  Location: [facility];  Service: Cardiopulmonary;  Laterality:  Bilateral;  . Video bronchoscopy Bilateral 08/12/2014    Procedure: VIDEO BRONCHOSCOPY WITHOUT FLUORO;  Surgeon: Shanon Brow  Achille Rich,  MD;  Location: [facility];  Service: Cardiopulmonary;  Laterality:  Bilateral;   HPI:  Other Pertinent Information: Patient is a 69 year old man admitted after  vomiting dark material with concern for aspiration.  PMH + with history of  lung cancer status post chemotherapy in April and radiation that  finished  in May with a history of postobstructive pneumonia and difficulty  swallowing who presents to the hospital with c/o increased difficulty  swallowing and lethargy. CXR shows residual or recurrent RLL PNA.  CT  chest indicated 1. No evidence of pulmonary embolus. 2. Recurrent patchy  opacities at the right lower lung lobe, with associated right pleural  effusion concerning for pna,  Mildly enlarged azygoesophageal recess node  is concerning for metastatic disease. Partial extension of mass into the  right paratracheal region.    No Data Recorded  Assessment / Plan / Recommendation CHL IP CLINICAL IMPRESSIONS 03/27/2015  Therapy Diagnosis Severe oral phase dysphagia;Moderate pharyngeal phase  dysphagia  Clinical Impression Pt demonstrates a moderate to severe oral dysphagia  with limited attempts to transit solid textures and slight lingual pumping  for liquid transit. Pt able to follow cues for slight posterior head tilt  to transit liquid boluses (though this was not necessary at bedside).  Swallow initiation is delayed but no penetration or aspiration occurred  with large or small boluses, though small boluses are certainly preferred  to reduce risk of penetration. A head tilt with a second swallow is also  helpful to transit oral residuals. Oropharyngeal mobility, strength and  structures were WNL under fluoroscopy. Recommend full liquid texture with  ongoing puree trials with therapy to advance diet. Pt would not manipulate  whole pill and struggled with puree texture, so pills crushed in liquids  or maybe ice cream would be worth trying. Given severity of deficits,  adequate, consistent PO and medication consumption will be difficult.       CHL IP TREATMENT RECOMMENDATION 03/27/2015  Treatment Recommendations Therapy as outlined in treatment plan below     CHL IP DIET RECOMMENDATION 03/27/2015  SLP Diet Recommendations Thin  Liquid Administration via (None)  Medication Administration Crushed with puree   Compensations Slow rate;Small sips/bites;Check for anterior loss  Postural Changes and/or Swallow Maneuvers (None)     CHL IP OTHER RECOMMENDATIONS 03/27/2015  Recommended Consults (None)  Oral Care Recommendations Oral care BID  Other Recommendations Have oral suction available     CHL IP FOLLOW UP RECOMMENDATIONS 03/16/2015  Follow up Recommendations Other (comment)     CHL IP FREQUENCY AND DURATION 03/27/2015  Speech Therapy Frequency (ACUTE ONLY) min 2x/week  Treatment Duration 2 weeks     Pertinent Vitals/Pain NA    SLP Swallow Goals No flowsheet data found.  No flowsheet data found.    CHL IP REASON FOR REFERRAL 03/27/2015  Reason for Referral Objectively evaluate swallowing function             No flowsheet data found.  No flowsheet data found.        Herbie [place], [place] [phone]  Lynann Beaver 03/27/2015, 2:30 PM     Microbiology: No results found for this or any previous visit (from the past 240 hour(s)).   Labs: Basic Metabolic Panel:  Recent Labs Lab 04/02/15 0444 04/03/15 0426 04/04/15 0447  NA 133* 132* 133*  K 3.6 3.3* 3.8  CL 101 99* 102  CO2 _0 GLUCOSE 109* 110* 104*  BUN 19 14 11  CREATININE 1.51* 1.40* 1.48*  CALCIUM 9.6 9.6 9.5   Liver Function Tests:  Recent Labs Lab 04/04/15 0447  AST 18  ALT 13*  ALKPHOS 35*  BILITOT 0.3  PROT 7.5  ALBUMIN 2.2*    Recent Labs Lab 04/04/15 0447  LIPASE 30   No results for input(s): AMMONIA in the last 168 hours. CBC:  Recent Labs Lab 04/02/15 0444 04/03/15 0426 04/04/15 0447  WBC 4.1 4.0 5.2  HGB 10.9* 11.4* 10.8*  HCT 32.8* 34.0* 31.6*  MCV 92.9 91.9 92.7  PLT 219 228 212   Cardiac Enzymes: No results for input(s): CKTOTAL, CKMB, CKMBINDEX, TROPONINI in the last 168 hours. BNP: BNP (last 3 results) No results for input(s): BNP in the last 8760 hours.  ProBNP (last 3 results) No results for input(s): PROBNP in the last 8760 hours.  CBG: No results for input(s): GLUCAP in the  last 168 hours.  Time coordinating discharge: 35 minutes  Signed:  Anneli Bing  [facility] 04/06/2015, 1:34 PM

## 2015-04-06 NOTE — Care Management Important Message (Signed)
Important Message  Patient Details  Name: Stephen Ellis MRN: [id number] Date of Birth: 1945-09-17   Medicare Important Message Given:  Yes-fourth notification given    Shelda Altes 04/06/2015, 2:56 [place] Message  Patient Details  Name: Stephen Ellis MRN: [id number] Date of Birth: 1946-05-15   Medicare Important Message Given:  Yes-fourth notification given    Shelda Altes 04/06/2015, 2:56 PM

## 2015-04-06 NOTE — Progress Notes (Signed)
Daily Progress Note   Patient Name: Stephen Ellis       Date: 04/06/2015 DOB: 01/25/1946  Age: 69 y.o. MRN#: [id number] Attending Physician: Janece Canterbury, MD Primary Care Physician: Elizabeth Palau, MD Admit Date: 03/24/2015  Reason for Consultation/Follow-up: Establishing goals of care, Family-clinician negotiation, Non pain symptom management and Psychosocial/spiritual support  Subjective:    -follow up appointment after [place] meeting on Saturday morning.  Patient has continued decline over the past 48 hrs with little po intake, declining urine output.    Focus of care is comfort and dignity as clearly stated and supported by family.  -[facility] for residential hospice facility, will write for choice  -left message for Randall Hiss on voice mail    Length of Stay: 12 days  Current Medications: Scheduled Meds:  . antiseptic oral rinse  7 mL Mouth Rinse q12n4p  . chlorhexidine  15 mL Mouth Rinse BID  . dexamethasone  4 mg Intravenous Q24H  . ondansetron (ZOFRAN) IV  4 mg Intravenous 3 times per day    Continuous Infusions:    PRN Meds: bisacodyl, ipratropium-albuterol, LORazepam, morphine injection, ondansetron (ZOFRAN) IV, promethazine  Palliative Performance Scale: 20 % at best     Vital Signs: BP 135/82 mmHg  Pulse 76  Temp(Src) 98.2 F (36.8 C) (Oral)  Resp 18  Ht '5\' 8"'$  (1.727 m)  Wt 68.6 kg (151 lb 3.8 oz)  BMI 23.00 kg/m2  SpO2 100% SpO2: SpO2: 100 % O2 Device: O2 Device: Not Delivered O2 Flow Rate:    Intake/output summary:   Intake/Output Summary (Last 24 hours) at 04/06/15 0818 Last data filed at 04/06/15 0534  Gross per 24 hour  Intake      0 ml  Output    800 ml  Net   -800 ml   LBM: Last BM Date: 04/05/15 Baseline Weight: Weight: 68.6 kg (151 lb 3.8 oz) Most recent weight: Weight: 68.6 kg (151 lb 3.8 oz)  Physical Exam:              General: chronically ill appearing, weaker HEENT: moist buccal membranes, audible throat secretions CVS:   RRR Resp: decreased in bases, scattered rhonchi  Skin: warm and dry Neuro: eyes open and tracks,unable today to communicate with head nods    Additional Data Reviewed: Recent Labs     04/04/15  0447  WBC  5.2  HGB  10.8*  PLT  212  NA  133*  BUN  11  CREATININE  1.48*     Problem List:  Patient Active Problem List   Diagnosis Date Noted  . Palliative care encounter 04/03/2015  . DNR (do not resuscitate) discussion 04/03/2015  . Brain metastasis 03/30/2015  . Dysphagia 03/30/2015  . Aphonia 03/25/2015  . Malnutrition of moderate degree 03/14/2015  . Aspiration pneumonia 03/13/2015  . CKD (chronic kidney disease) stage 3, GFR 30-59 ml/min 03/13/2015  . ARF (acute renal failure) 03/13/2015  . Lung cancer, main bronchus 01/04/2015  . Pneumonia 12/26/2014  . AKI (acute kidney injury) 09/20/2014  . Acute respiratory failure with hypoxia 09/06/2014  . Anemia of chronic disease 09/06/2014  . Leukopenia 09/06/2014  . HCAP (healthcare-associated pneumonia) 09/01/2014  . Essential hypertension 09/01/2014  . Bronchogenic carcinoma of right lung   . Postobstructive pneumonia 08/07/2014  . 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  No further diagnostics, no artifical feeding or hydration now or in the future.  Symptom management to enhance comfort.     Comfort feeds as tolerated with known risk of aspiration, family understands risks and benefit, goal is to enhance comfort..  Symptoms:  Anxiety- schedule Ativan 1 mg IV every 6 hrs   Prognosis: less than 2 weeks   Discharge Planning:    Discussed with Cristal Deer LCSW  Thank you for allowing the Palliative Medicine Team to assist in the care of this patient.   Time In:  0800 Time Out: 0825 Total Time  25 min Prolonged Time Billed  no     Greater than 50%  of this time was spent  counseling and coordinating care related to the above assessment and plan.     Knox Royalty, NP  04/06/2015, 8:18 AM  Please contact Palliative Medicine Team phone at [phone] for questions and concerns.

## 2015-04-06 NOTE — Progress Notes (Signed)
Nutrition Brief Note  Chart reviewed. Pt now transitioning to comfort care. Per Palliative care note 7/30, pt to be comfort care only and comfort feeds permitted with known risk of aspiration. No further nutrition interventions warranted at this time.  Please re-consult as needed.      Jarome Matin, RD, LDN Inpatient Clinical Dietitian Pager # [phone] After hours/weekend pager # [phone]

## 2015-04-06 NOTE — Telephone Encounter (Signed)
Phoned Stephen Ellis, [place]. No answer. Left message. Understand from palliative care notes the patient plans to transition to comfort care since his status has greatly declined over the last 48 hours.

## 2015-04-06 NOTE — Progress Notes (Signed)
04/06/15 [place] report to [place]

## 2015-04-06 NOTE — Telephone Encounter (Signed)
[name], nephew, returned this RN's call. He reports he is the patient's durable power of attorney. Mr. Erling Cruz goes on to explain that the patient's son, Stephen Ellis, has decided to forgo a feeding tube and radiation. He reports Stephen Ellis has decided to transition his father to comfort care only. Phoned Stephen Ellis and he confirmed what Mr. Erling Cruz reported. Encouraged Stephen Ellis to contact our office for future needs in the event the status of his father improves. He verbalized understanding.

## 2015-04-07 ENCOUNTER — Ambulatory Visit: Payer: Medicare HMO

## 2015-04-08 ENCOUNTER — Ambulatory Visit: Payer: Medicare HMO

## 2015-04-09 ENCOUNTER — Ambulatory Visit: Payer: Medicare HMO

## 2015-04-10 ENCOUNTER — Ambulatory Visit: Payer: Medicare HMO

## 2015-04-13 ENCOUNTER — Ambulatory Visit: Payer: Medicare HMO

## 2015-04-14 ENCOUNTER — Ambulatory Visit: Payer: Medicare HMO

## 2015-04-15 ENCOUNTER — Ambulatory Visit: Payer: Medicare HMO

## 2015-04-16 ENCOUNTER — Ambulatory Visit: Payer: Medicare HMO

## 2015-04-17 ENCOUNTER — Ambulatory Visit: Payer: Medicare HMO

## 2015-04-17 ENCOUNTER — Telehealth: Payer: Self-pay | Admitting: *Deleted

## 2015-04-17 NOTE — Telephone Encounter (Signed)
[name], pt's nephew called requesting letter of completion of chemotherapy to give to hospice.  ED Note on 8/1 pt discharged to [facility], called [facility] today to clarify nephews request. Vicki,RN advised pt is actively dying. [facility] does not require letter of completion  Rachelle Hora, regarding letter unable to reach.  [facility] requesting if pt would like letter mailed, faxed or if it will be picked up.

## 2015-04-20 ENCOUNTER — Ambulatory Visit: Payer: Medicare HMO

## 2015-04-21 ENCOUNTER — Ambulatory Visit: Payer: Medicare HMO

## 2015-04-21 ENCOUNTER — Telehealth: Payer: Self-pay | Admitting: Medical Oncology

## 2015-04-21 NOTE — Telephone Encounter (Signed)
Stephen Ellis called to tell me pt died and he appreciated all the care he received here. " you all became part of my family too"

## 2015-05-07 DEATH — deceased

## 2015-11-20 ENCOUNTER — Other Ambulatory Visit: Payer: Self-pay | Admitting: Nurse Practitioner

## 2016-07-28 IMAGING — CT CT CHEST W/O CM
1 of 2 series · 14 of 32 positions shown, 18 images · non-contrast
Comparison: 11/21/2014

CLINICAL DATA: Nonproductive cough for 3 days. Ongoing chemotherapy
and radiation for right lung cancer.

EXAM:
CT CHEST WITHOUT CONTRAST
TECHNIQUE: Multidetector CT imaging of the chest was performed following the
standard protocol without IV contrast..

[Series 2: chest w/o st · axial · non-contrast · 0.65mm/px · z∈[+1387,+1652]mm · 14 of 63 slices shown, 18 images]
[im 5/63  mediastinal]
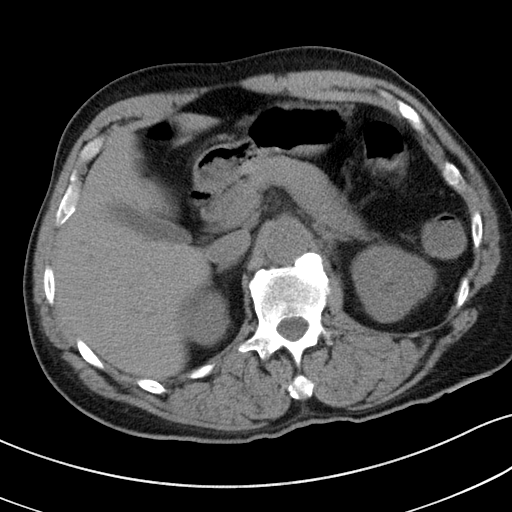
[im 5/63  lung]
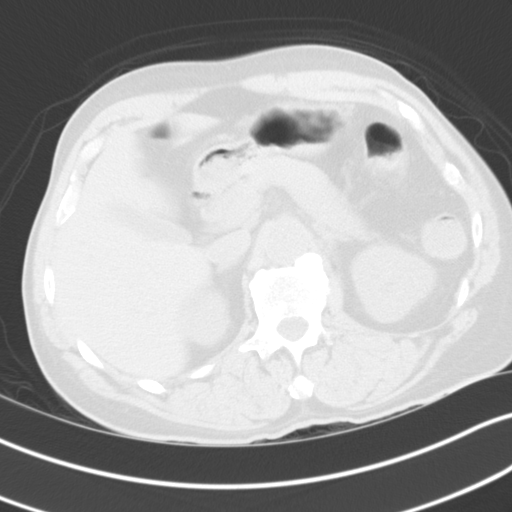
[im 10/63  lung]
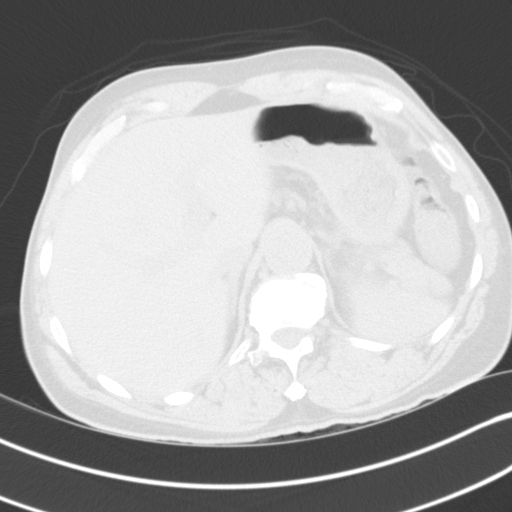
[im 15/63  lung]
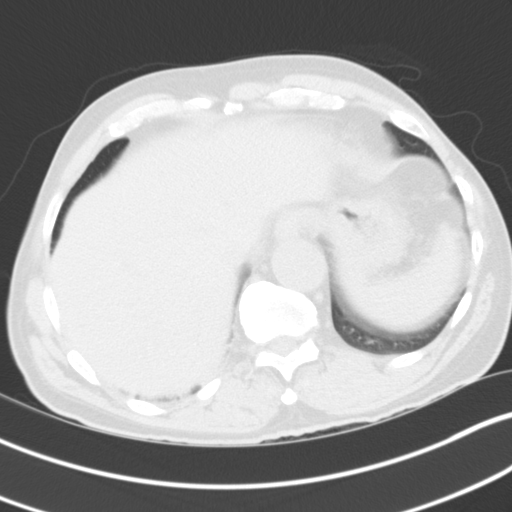
[im 20/63  lung]
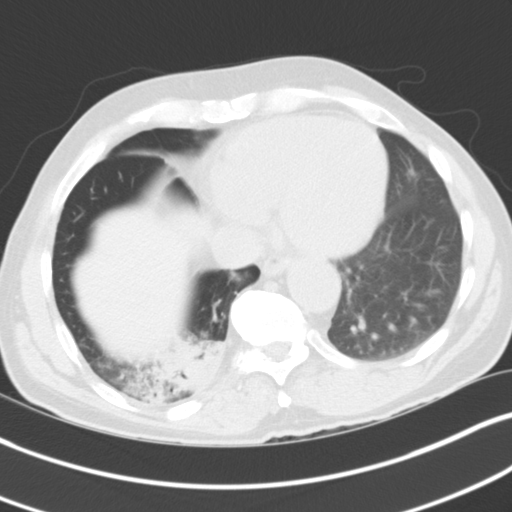
[im 24/63  mediastinal]
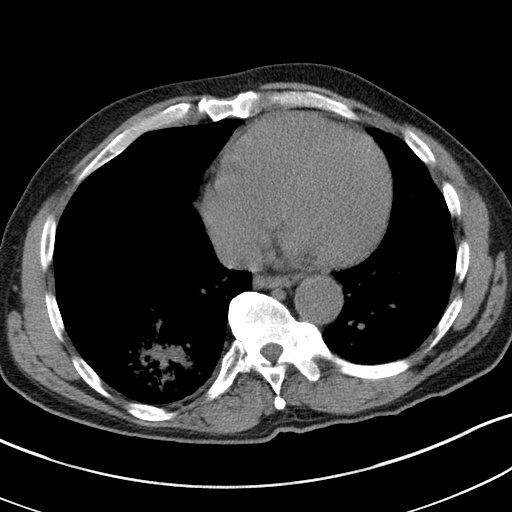
[im 24/63  lung]
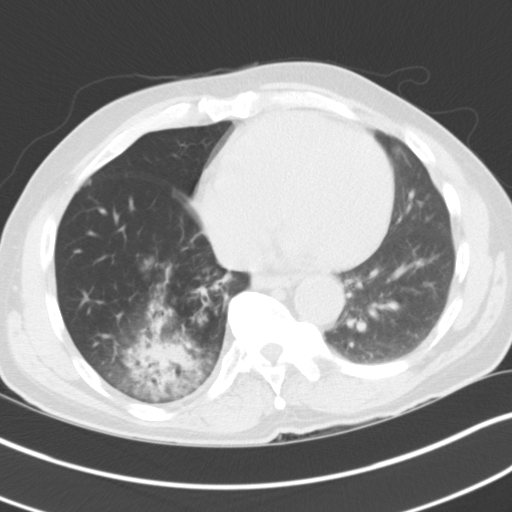
[im 29/63  lung]
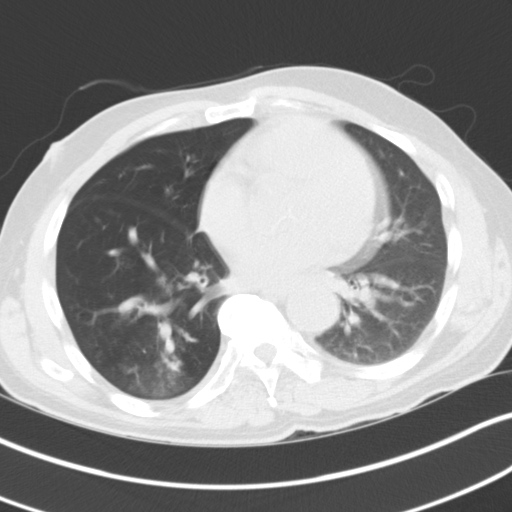
[im 30/63  lung]
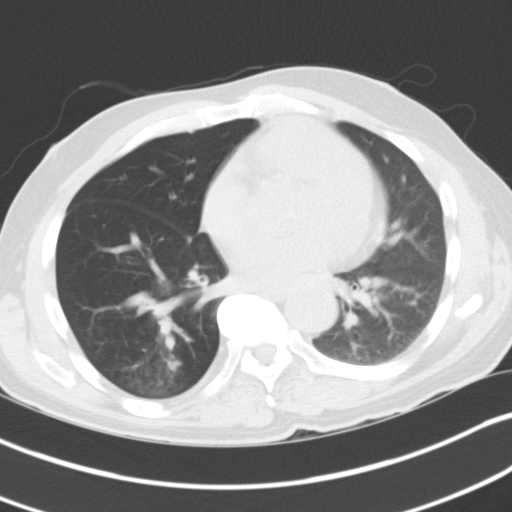
[im 32/63  lung]
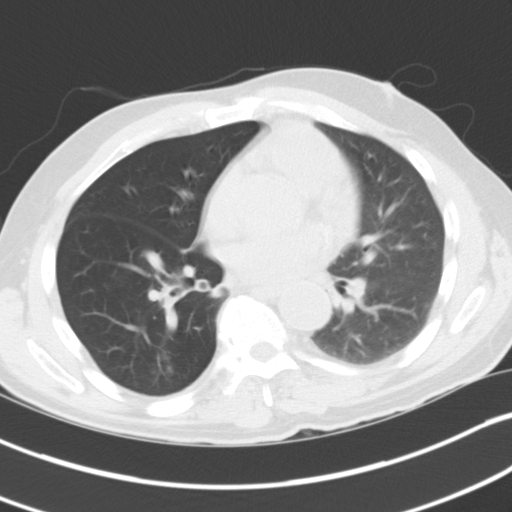
[im 34/63  mediastinal]
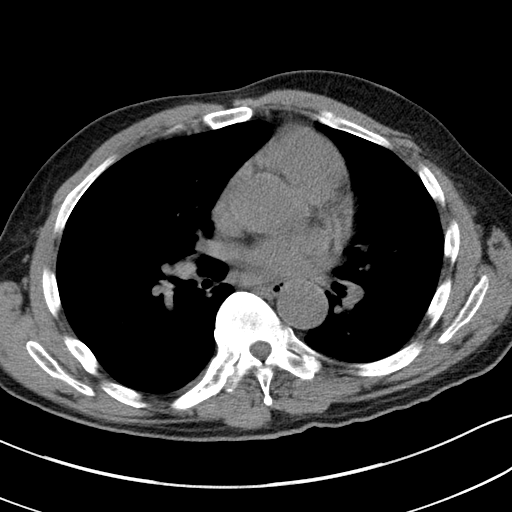
[im 34/63  lung]
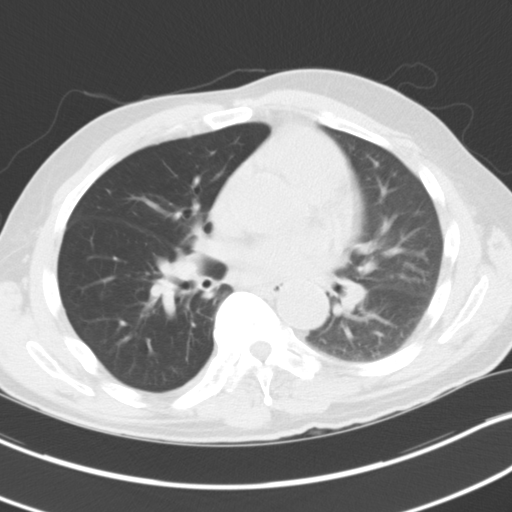
[im 39/63  lung]
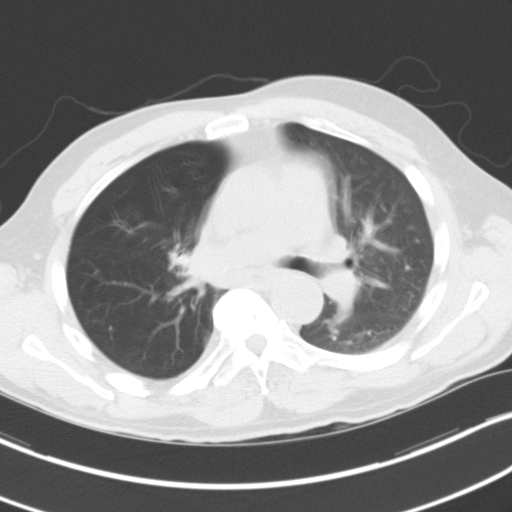
[im 43/63  lung]
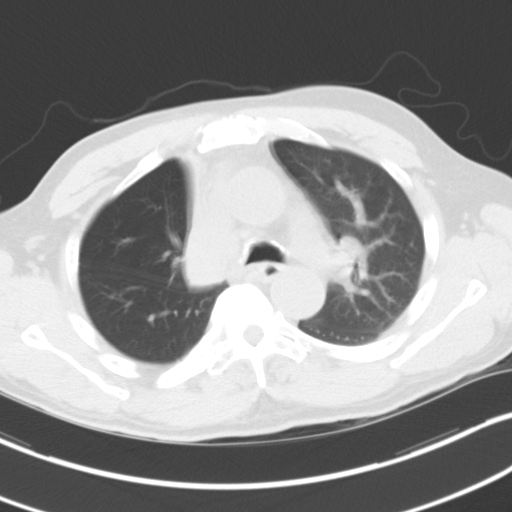
[im 48/63  lung]
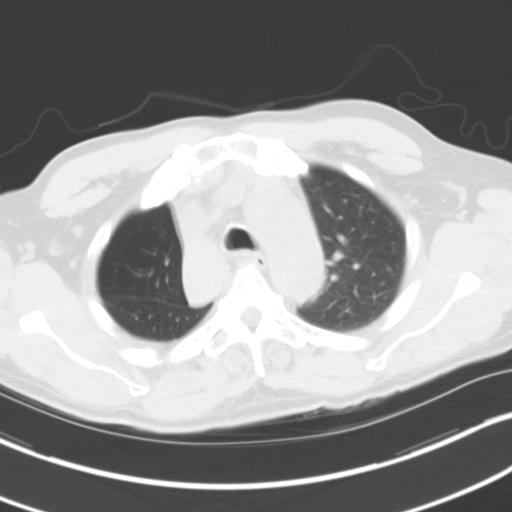
[im 53/63  mediastinal]
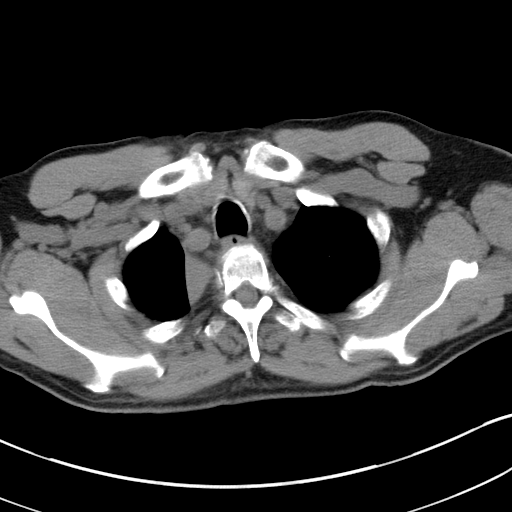
[im 53/63  lung]
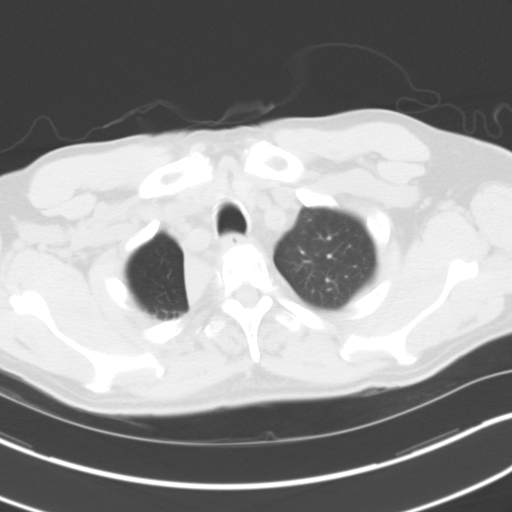
[im 58/63  lung]
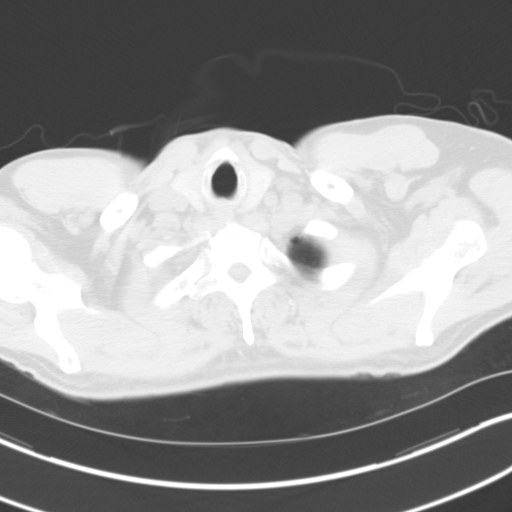

[14 of 32 positions shown; findings below may reference images not displayed]

FINDINGS: CT CHEST FINDINGS

Mediastinum/Nodes: Heart is normal size. Aorta is normal caliber.
1.4 cm subcarinal lymph node is stable. Other small scattered
subcentimeric lymph nodes in the mediastinum. No axillary
adenopathy. No visible hilar adenopathy on this unenhanced study.

Lungs/Pleura: Continued postobstructive collapse of the right upper
lobe. Obstructing central right upper lobe mass again noted, grossly
unchanged, difficult to measure exactly will without intravenous
contrast and due to the adjacent collapsed right upper lobe. New
area of consolidation in the right lower lobe compatible with
pneumonia. No pleural effusions.

Musculoskeletal: Chest wall soft tissues are unremarkable. No acute
bony abnormality or focal bone lesion.

Upper abdomen: Imaging into the upper abdomen shows no acute
findings.
IMPRESSION: New area consolidation in the right lower lobe compatible with
pneumonia.

Stable central obstructing right upper lobe mass with right upper
lobe collapse. Stable subcarinal adenopathy.
